# Patient Record
Sex: Female | Born: 1965 | Race: White | Hispanic: No | Marital: Married | Smoking: Never smoker
Health system: Southern US, Community
[De-identification: ages and names within clinical notes are randomized; demographics above are authoritative.]

## PROBLEM LIST (undated history)

## (undated) DIAGNOSIS — R55 Syncope and collapse: Secondary | ICD-10-CM

## (undated) DIAGNOSIS — F329 Major depressive disorder, single episode, unspecified: Secondary | ICD-10-CM

## (undated) DIAGNOSIS — K5900 Constipation, unspecified: Secondary | ICD-10-CM

## (undated) DIAGNOSIS — B029 Zoster without complications: Secondary | ICD-10-CM

## (undated) DIAGNOSIS — R112 Nausea with vomiting, unspecified: Secondary | ICD-10-CM

## (undated) DIAGNOSIS — M549 Dorsalgia, unspecified: Secondary | ICD-10-CM

## (undated) DIAGNOSIS — Z87442 Personal history of urinary calculi: Secondary | ICD-10-CM

## (undated) DIAGNOSIS — Z9889 Other specified postprocedural states: Secondary | ICD-10-CM

## (undated) DIAGNOSIS — Z5189 Encounter for other specified aftercare: Secondary | ICD-10-CM

## (undated) DIAGNOSIS — L4050 Arthropathic psoriasis, unspecified: Secondary | ICD-10-CM

## (undated) DIAGNOSIS — E785 Hyperlipidemia, unspecified: Secondary | ICD-10-CM

## (undated) DIAGNOSIS — E538 Deficiency of other specified B group vitamins: Secondary | ICD-10-CM

## (undated) DIAGNOSIS — F419 Anxiety disorder, unspecified: Secondary | ICD-10-CM

## (undated) DIAGNOSIS — F32A Depression, unspecified: Secondary | ICD-10-CM

## (undated) DIAGNOSIS — R9389 Abnormal findings on diagnostic imaging of other specified body structures: Secondary | ICD-10-CM

## (undated) DIAGNOSIS — R002 Palpitations: Secondary | ICD-10-CM

## (undated) DIAGNOSIS — R0602 Shortness of breath: Secondary | ICD-10-CM

## (undated) DIAGNOSIS — N289 Disorder of kidney and ureter, unspecified: Secondary | ICD-10-CM

## (undated) DIAGNOSIS — R7303 Prediabetes: Secondary | ICD-10-CM

## (undated) DIAGNOSIS — R600 Localized edema: Secondary | ICD-10-CM

## (undated) DIAGNOSIS — Z950 Presence of cardiac pacemaker: Secondary | ICD-10-CM

## (undated) DIAGNOSIS — R51 Headache: Secondary | ICD-10-CM

## (undated) DIAGNOSIS — K219 Gastro-esophageal reflux disease without esophagitis: Secondary | ICD-10-CM

## (undated) HISTORY — DX: Prediabetes: R73.03

## (undated) HISTORY — DX: Hyperlipidemia, unspecified: E78.5

## (undated) HISTORY — DX: Major depressive disorder, single episode, unspecified: F32.9

## (undated) HISTORY — DX: Headache: R51

## (undated) HISTORY — DX: Depression, unspecified: F32.A

## (undated) HISTORY — DX: Shortness of breath: R06.02

## (undated) HISTORY — DX: Localized edema: R60.0

## (undated) HISTORY — DX: Encounter for other specified aftercare: Z51.89

## (undated) HISTORY — PX: TUBAL LIGATION: SHX77

## (undated) HISTORY — DX: Zoster without complications: B02.9

## (undated) HISTORY — PX: OTHER SURGICAL HISTORY: SHX169

## (undated) HISTORY — PX: INSERT / REPLACE / REMOVE PACEMAKER: SUR710

## (undated) HISTORY — DX: Anxiety disorder, unspecified: F41.9

## (undated) HISTORY — DX: Palpitations: R00.2

## (undated) HISTORY — DX: Gastro-esophageal reflux disease without esophagitis: K21.9

## (undated) HISTORY — DX: Deficiency of other specified B group vitamins: E53.8

## (undated) HISTORY — DX: Dorsalgia, unspecified: M54.9

## (undated) HISTORY — DX: Constipation, unspecified: K59.00

---
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
## 1987-09-23 HISTORY — PX: LUMBAR LAMINECTOMY: SHX95

## 1995-04-28 HISTORY — PX: INSERT / REPLACE / REMOVE PACEMAKER: SUR710

## 2000-09-21 ENCOUNTER — Encounter: Admission: RE | Admit: 2000-09-21 | Discharge: 2000-09-21 | Payer: Self-pay | Admitting: Family Medicine

## 2000-09-21 ENCOUNTER — Encounter: Payer: Self-pay | Admitting: Family Medicine

## 2000-10-12 ENCOUNTER — Encounter: Payer: Self-pay | Admitting: Family Medicine

## 2000-10-12 ENCOUNTER — Encounter: Admission: RE | Admit: 2000-10-12 | Discharge: 2000-10-12 | Payer: Self-pay | Admitting: Family Medicine

## 2000-11-21 ENCOUNTER — Encounter: Payer: Self-pay | Admitting: Family Medicine

## 2000-11-21 ENCOUNTER — Encounter: Admission: RE | Admit: 2000-11-21 | Discharge: 2000-11-21 | Payer: Self-pay | Admitting: Family Medicine

## 2001-09-23 ENCOUNTER — Encounter (INDEPENDENT_AMBULATORY_CARE_PROVIDER_SITE_OTHER): Payer: Self-pay | Admitting: Specialist

## 2001-09-23 ENCOUNTER — Ambulatory Visit (HOSPITAL_COMMUNITY): Admission: RE | Admit: 2001-09-23 | Discharge: 2001-09-23 | Payer: Self-pay | Admitting: *Deleted

## 2001-09-30 ENCOUNTER — Encounter: Payer: Self-pay | Admitting: Family Medicine

## 2001-09-30 ENCOUNTER — Encounter: Admission: RE | Admit: 2001-09-30 | Discharge: 2001-09-30 | Payer: Self-pay | Admitting: Family Medicine

## 2002-04-15 ENCOUNTER — Encounter: Payer: Self-pay | Admitting: Neurosurgery

## 2002-04-15 ENCOUNTER — Encounter: Admission: RE | Admit: 2002-04-15 | Discharge: 2002-04-15 | Payer: Self-pay | Admitting: Neurosurgery

## 2002-09-22 HISTORY — PX: LUMBAR FUSION: SHX111

## 2002-09-30 ENCOUNTER — Encounter: Admission: RE | Admit: 2002-09-30 | Discharge: 2002-09-30 | Payer: Self-pay | Admitting: Family Medicine

## 2002-09-30 ENCOUNTER — Encounter: Payer: Self-pay | Admitting: Family Medicine

## 2002-09-30 HISTORY — PX: BREAST BIOPSY: SHX20

## 2002-10-17 ENCOUNTER — Encounter: Payer: Self-pay | Admitting: Emergency Medicine

## 2002-10-17 ENCOUNTER — Emergency Department (HOSPITAL_COMMUNITY): Admission: EM | Admit: 2002-10-17 | Discharge: 2002-10-18 | Payer: Self-pay | Admitting: Emergency Medicine

## 2003-01-17 ENCOUNTER — Encounter: Payer: Self-pay | Admitting: Neurosurgery

## 2003-01-17 ENCOUNTER — Encounter: Admission: RE | Admit: 2003-01-17 | Discharge: 2003-01-17 | Payer: Self-pay | Admitting: Neurosurgery

## 2003-07-06 ENCOUNTER — Encounter: Payer: Self-pay | Admitting: Orthopaedic Surgery

## 2003-07-12 ENCOUNTER — Inpatient Hospital Stay (HOSPITAL_COMMUNITY): Admission: RE | Admit: 2003-07-12 | Discharge: 2003-07-14 | Payer: Self-pay | Admitting: Orthopaedic Surgery

## 2003-07-12 ENCOUNTER — Encounter: Payer: Self-pay | Admitting: Orthopaedic Surgery

## 2003-07-14 ENCOUNTER — Encounter: Payer: Self-pay | Admitting: Orthopaedic Surgery

## 2004-10-11 ENCOUNTER — Ambulatory Visit: Payer: Self-pay | Admitting: Family Medicine

## 2004-10-27 ENCOUNTER — Emergency Department: Payer: Self-pay | Admitting: Emergency Medicine

## 2004-10-31 ENCOUNTER — Ambulatory Visit: Payer: Self-pay | Admitting: Internal Medicine

## 2004-11-05 ENCOUNTER — Ambulatory Visit: Payer: Self-pay | Admitting: Internal Medicine

## 2004-11-08 ENCOUNTER — Ambulatory Visit: Payer: Self-pay | Admitting: Internal Medicine

## 2004-12-03 ENCOUNTER — Ambulatory Visit: Payer: Self-pay | Admitting: Family Medicine

## 2005-01-30 ENCOUNTER — Ambulatory Visit: Payer: Self-pay | Admitting: Family Medicine

## 2005-05-06 ENCOUNTER — Ambulatory Visit: Payer: Self-pay | Admitting: Family Medicine

## 2005-05-13 ENCOUNTER — Ambulatory Visit: Payer: Self-pay | Admitting: Family Medicine

## 2005-07-08 ENCOUNTER — Ambulatory Visit: Payer: Self-pay | Admitting: Internal Medicine

## 2005-07-11 ENCOUNTER — Ambulatory Visit: Payer: Self-pay

## 2005-08-12 ENCOUNTER — Ambulatory Visit: Payer: Self-pay | Admitting: Internal Medicine

## 2005-09-06 ENCOUNTER — Ambulatory Visit: Payer: Self-pay | Admitting: Internal Medicine

## 2005-09-16 ENCOUNTER — Ambulatory Visit: Payer: Self-pay | Admitting: Internal Medicine

## 2005-09-18 ENCOUNTER — Ambulatory Visit: Payer: Self-pay | Admitting: Family Medicine

## 2005-10-21 ENCOUNTER — Ambulatory Visit: Payer: Self-pay | Admitting: Internal Medicine

## 2005-11-15 ENCOUNTER — Ambulatory Visit: Payer: Self-pay | Admitting: Family Medicine

## 2006-02-07 ENCOUNTER — Ambulatory Visit: Payer: Self-pay | Admitting: Internal Medicine

## 2006-02-10 ENCOUNTER — Ambulatory Visit: Payer: Self-pay | Admitting: *Deleted

## 2006-03-13 ENCOUNTER — Ambulatory Visit: Payer: Self-pay | Admitting: Family Medicine

## 2006-05-12 ENCOUNTER — Ambulatory Visit: Payer: Self-pay | Admitting: Family Medicine

## 2006-07-09 ENCOUNTER — Ambulatory Visit: Payer: Self-pay | Admitting: Obstetrics and Gynecology

## 2006-08-10 ENCOUNTER — Ambulatory Visit: Payer: Self-pay | Admitting: Family Medicine

## 2006-09-16 ENCOUNTER — Ambulatory Visit: Payer: Self-pay | Admitting: Family Medicine

## 2006-10-16 ENCOUNTER — Ambulatory Visit: Payer: Self-pay | Admitting: Family Medicine

## 2006-10-16 LAB — CONVERTED CEMR LAB
ALT: 17 units/L (ref 0–40)
AST: 20 units/L (ref 0–37)
Albumin: 3.9 g/dL (ref 3.5–5.2)
Alkaline Phosphatase: 36 units/L — ABNORMAL LOW (ref 39–117)
BUN: 12 mg/dL (ref 6–23)
CO2: 32 meq/L (ref 19–32)
Calcium: 9.7 mg/dL (ref 8.4–10.5)
Chloride: 106 meq/L (ref 96–112)
Creatinine, Ser: 0.9 mg/dL (ref 0.4–1.2)
GFR calc Af Amer: 90 mL/min
GFR calc non Af Amer: 74 mL/min
Glucose, Bld: 101 mg/dL — ABNORMAL HIGH (ref 70–99)
HCT: 38.4 % (ref 36.0–46.0)
Hemoglobin: 13.4 g/dL (ref 12.0–15.0)
Hgb A1c MFr Bld: 5.8 % (ref 4.6–6.0)
MCHC: 35 g/dL (ref 30.0–36.0)
MCV: 91.5 fL (ref 78.0–100.0)
Platelets: 326 10*3/uL (ref 150–400)
Potassium: 3.7 meq/L (ref 3.5–5.1)
RBC: 4.2 M/uL (ref 3.87–5.11)
RDW: 12.7 % (ref 11.5–14.6)
Sodium: 143 meq/L (ref 135–145)
TSH: 1.23 microintl units/mL (ref 0.35–5.50)
Total Bilirubin: 0.8 mg/dL (ref 0.3–1.2)
Total Protein: 7.4 g/dL (ref 6.0–8.3)
WBC: 8.8 10*3/uL (ref 4.5–10.5)

## 2006-12-22 ENCOUNTER — Ambulatory Visit: Payer: Self-pay | Admitting: Internal Medicine

## 2007-01-11 ENCOUNTER — Ambulatory Visit: Payer: Self-pay | Admitting: Family Medicine

## 2007-05-11 ENCOUNTER — Ambulatory Visit: Payer: Self-pay | Admitting: Internal Medicine

## 2007-09-23 HISTORY — PX: TOTAL ABDOMINAL HYSTERECTOMY: SHX209

## 2007-09-23 HISTORY — PX: ABDOMINAL HYSTERECTOMY: SHX81

## 2007-12-15 ENCOUNTER — Telehealth: Payer: Self-pay | Admitting: Family Medicine

## 2007-12-15 ENCOUNTER — Ambulatory Visit: Payer: Self-pay | Admitting: Obstetrics and Gynecology

## 2008-01-18 ENCOUNTER — Ambulatory Visit: Payer: Self-pay | Admitting: Family Medicine

## 2008-01-18 DIAGNOSIS — R51 Headache: Secondary | ICD-10-CM | POA: Insufficient documentation

## 2008-01-18 DIAGNOSIS — R519 Headache, unspecified: Secondary | ICD-10-CM | POA: Insufficient documentation

## 2008-01-18 DIAGNOSIS — F329 Major depressive disorder, single episode, unspecified: Secondary | ICD-10-CM

## 2008-01-18 DIAGNOSIS — J309 Allergic rhinitis, unspecified: Secondary | ICD-10-CM | POA: Insufficient documentation

## 2008-01-18 DIAGNOSIS — J45909 Unspecified asthma, uncomplicated: Secondary | ICD-10-CM | POA: Insufficient documentation

## 2008-01-18 DIAGNOSIS — F3289 Other specified depressive episodes: Secondary | ICD-10-CM | POA: Insufficient documentation

## 2008-01-25 ENCOUNTER — Encounter: Payer: Self-pay | Admitting: Family Medicine

## 2008-03-21 ENCOUNTER — Ambulatory Visit: Payer: Self-pay | Admitting: Internal Medicine

## 2008-04-07 ENCOUNTER — Telehealth: Payer: Self-pay | Admitting: Family Medicine

## 2008-06-20 ENCOUNTER — Ambulatory Visit: Payer: Self-pay | Admitting: Internal Medicine

## 2008-07-18 ENCOUNTER — Ambulatory Visit: Payer: Self-pay | Admitting: Family Medicine

## 2008-07-18 DIAGNOSIS — I10 Essential (primary) hypertension: Secondary | ICD-10-CM | POA: Insufficient documentation

## 2008-07-20 ENCOUNTER — Encounter: Payer: Self-pay | Admitting: Family Medicine

## 2008-07-20 ENCOUNTER — Ambulatory Visit: Payer: Self-pay | Admitting: Obstetrics and Gynecology

## 2008-07-20 ENCOUNTER — Ambulatory Visit: Payer: Self-pay | Admitting: Family Medicine

## 2008-07-26 ENCOUNTER — Telehealth: Payer: Self-pay | Admitting: Family Medicine

## 2008-08-08 ENCOUNTER — Ambulatory Visit: Payer: Self-pay | Admitting: Obstetrics and Gynecology

## 2008-08-16 ENCOUNTER — Telehealth: Payer: Self-pay | Admitting: Family Medicine

## 2008-08-31 ENCOUNTER — Inpatient Hospital Stay: Payer: Self-pay | Admitting: Obstetrics and Gynecology

## 2008-09-27 ENCOUNTER — Ambulatory Visit: Payer: Self-pay | Admitting: Obstetrics and Gynecology

## 2008-11-28 ENCOUNTER — Ambulatory Visit: Payer: Self-pay | Admitting: Internal Medicine

## 2009-04-16 ENCOUNTER — Ambulatory Visit: Payer: Self-pay | Admitting: Unknown Physician Specialty

## 2009-09-25 ENCOUNTER — Ambulatory Visit: Payer: Self-pay | Admitting: Unknown Physician Specialty

## 2009-11-26 ENCOUNTER — Telehealth: Payer: Self-pay | Admitting: Internal Medicine

## 2009-11-27 ENCOUNTER — Ambulatory Visit: Payer: Self-pay | Admitting: Cardiovascular Disease

## 2009-11-27 DIAGNOSIS — R55 Syncope and collapse: Secondary | ICD-10-CM | POA: Insufficient documentation

## 2009-11-27 DIAGNOSIS — R079 Chest pain, unspecified: Secondary | ICD-10-CM | POA: Insufficient documentation

## 2009-11-29 ENCOUNTER — Telehealth: Payer: Self-pay | Admitting: Cardiovascular Disease

## 2009-12-03 ENCOUNTER — Ambulatory Visit: Payer: Self-pay | Admitting: Internal Medicine

## 2009-12-03 DIAGNOSIS — Z950 Presence of cardiac pacemaker: Secondary | ICD-10-CM | POA: Insufficient documentation

## 2009-12-07 ENCOUNTER — Telehealth: Payer: Self-pay | Admitting: Internal Medicine

## 2009-12-17 ENCOUNTER — Ambulatory Visit: Payer: Self-pay | Admitting: Internal Medicine

## 2009-12-17 ENCOUNTER — Telehealth: Payer: Self-pay | Admitting: Internal Medicine

## 2009-12-18 ENCOUNTER — Encounter: Payer: Self-pay | Admitting: Internal Medicine

## 2009-12-18 LAB — CONVERTED CEMR LAB
BUN: 19 mg/dL
Basophils Relative: 0.5 %
CO2: 26 meq/L
Calcium: 8.9 mg/dL
Chloride: 102 meq/L
Creatinine, Ser: 0.82 mg/dL
Eosinophils Relative: 2.9 %
Glucose, Bld: 93 mg/dL
HCT: 41.3 %
Hemoglobin: 14.2 g/dL
INR: 0.9
Lymphocytes, automated: 29.7 %
MCV: 93 fL
Monocytes Relative: 5.8 %
Neutrophils Relative %: 61.1 %
Platelets: 315 10*3/uL
Potassium: 4.4 meq/L
Prothrombin Time: 12.3 s
RBC: 4.42 M/uL
RDW: 13.4 %
Sodium: 135 meq/L
WBC: 6.2 10*3/uL

## 2009-12-19 ENCOUNTER — Ambulatory Visit: Payer: Self-pay | Admitting: Internal Medicine

## 2009-12-19 ENCOUNTER — Encounter: Payer: Self-pay | Admitting: Cardiovascular Disease

## 2009-12-27 ENCOUNTER — Encounter: Payer: Self-pay | Admitting: Internal Medicine

## 2009-12-31 ENCOUNTER — Ambulatory Visit: Payer: Self-pay | Admitting: Internal Medicine

## 2010-01-02 ENCOUNTER — Ambulatory Visit: Payer: Self-pay | Admitting: Unknown Physician Specialty

## 2010-02-02 ENCOUNTER — Ambulatory Visit: Payer: Self-pay | Admitting: Internal Medicine

## 2010-03-08 ENCOUNTER — Ambulatory Visit: Payer: Self-pay | Admitting: General Practice

## 2010-03-29 ENCOUNTER — Ambulatory Visit: Payer: Self-pay | Admitting: Unknown Physician Specialty

## 2010-08-07 ENCOUNTER — Ambulatory Visit: Payer: Self-pay

## 2010-09-06 ENCOUNTER — Ambulatory Visit: Payer: Self-pay

## 2010-10-17 ENCOUNTER — Encounter (INDEPENDENT_AMBULATORY_CARE_PROVIDER_SITE_OTHER): Payer: Self-pay | Admitting: *Deleted

## 2010-10-19 ENCOUNTER — Encounter: Payer: Self-pay | Admitting: Internal Medicine

## 2010-10-24 NOTE — Progress Notes (Signed)
Summary: chest pain  Phone Note Call from Patient Call back at Home Phone [phone]   Caller: Patient Summary of Call: Patient called, c/o atypical chest pain for past week.  Pt last seen in 2008 by Dr [name] for pacemaker follow-up.  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  She was also complaining of chest pain at that time, a nuc study was scheduled that she no-showed for.  She states this pain is different from then.  She states that it feels like someone has a blood pressure cuff around her left upper arm.  Not necessarily related to exertion.  No SOB.  Pt states that her cholesterol was checked through an employee health screening and was high, but her PCP has not started her on any lipid lowering agents.  Appt made with Dr Mariah Milling tomorrow at 11AM to be evaluated.  Will have Biotronik rep meet pt there to evaluate her device. Gypsy Balsam RN BSN  November 26, 2009 3:32 PM

## 2010-10-24 NOTE — Assessment & Plan Note (Signed)
Summary: chest pain.amber   Visit Type:  rov Referring Provider:  [name] Primary Provider:  Nelwyn Salisbury MD  CC:  chest pains, dizzy spells associated with pressure in chest, and feels like her arm gets tight. dizziness makes her feel luppy. sat night her tounge and mouth was numb feeling. sunday got really bad headache behind eye. Sometimes feels like can't get a deep breath. Edema in ankles and feet nothing new. .  History of Present Illness: 45 year old woman who works at [employer] at the [facility], previously seen by Dr. 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  She states that over the past several weeks, she has had several episodes of chest pressure associated with left arm discomfort. Sometimes she has dizziness. She was not going to do anything about it though recently had headaches, tongue and mouth numbness over the weekend when she woke up. She does have a history of migraines though feels that this is different. Her blood pressure has been elevated,  sometimes with systolics greater than 90 at home.work has been very stressful as they recently moved offices. She wonders if her chest pain may be due to underlying stress at work.  she denies any further episodes of syncope though has had some dizziness typically associated with her chest pressure.  Preventive Screening-Counseling & Management  Alcohol-Tobacco     Alcohol drinks/day: 0     Smoking Status: never  Caffeine-Diet-Exercise     Caffeine use/day: 1 can a day     Does Patient Exercise: no  Current Problems (verified): 1)  Hypertension  (ICD-401.9) 2)  Acute Sinusitis, Unspecified  (ICD-461.9) 3)  Headache  (ICD-784.0) 4)  Family History of Cad  Female 1st Degree Relative <50  (ICD-V17.3) 5)  Family History of Asthma  (ICD-V17.5) 6)  Depression  (ICD-311) 7)  Asthma  (ICD-493.90) 8)  Allergic Rhinitis  (ICD-477.9)  Current Medications (verified): 1)  Proventil Hfa 108 (90 Base) Mcg/act  Aers (Albuterol Sulfate) .... 2 Puffs Every 4 Hours As Needed For Sob 2)  Daily Multi  Tabs (Multiple Vitamins-Minerals) .Marland Kitchen.. 1 By Mouth Once Daily  Allergies (verified): No Known Drug Allergies  Past History:  Past Medical History: Last updated: 01/18/2008 Allergic rhinitis Asthma Depression Headache  Past Surgical History: Last updated: 07/18/2008 Rt lobe hemithroidectomy Permanent pacemaker, sees Dr. [name]  Family History: Last updated: 01/18/2008 Epilepsy-father Family History of Asthma Family History of CAD Female 1st degree relative <50 Family History Lung cancer Family History of Prostate CA 1st degree relative <50  Social History: Last updated: 01/18/2008 Married Never Smoked Alcohol use-no Drug use-no  Risk Factors: Alcohol Use: 0 (11/27/2009) Caffeine Use: 1 can a day (11/27/2009) Exercise: no (11/27/2009)  Risk Factors: Smoking Status: never (11/27/2009)  Social History: Alcohol drinks/day:  0 Caffeine use/day:  1 can a day Does Patient Exercise:  no  Review of Systems       The patient complains of chest pain.  The patient denies fever, vision loss, decreased hearing, hoarseness, syncope, dyspnea on exertion, peripheral edema, prolonged cough, abdominal pain, incontinence, muscle weakness, difficulty walking, depression, unusual weight change, and enlarged lymph nodes.         Arm pain, dizziness  Vital Signs:  Patient profile:  45 year old female Height:      66 inches Weight:      239 pounds BMI:     38.72 Pulse rate:   80 / minute Pulse rhythm:   regular BP sitting:   140 / 88  (left arm) Cuff size:   large  Vitals Entered By: Mercer Pod (November 27, 2009 11:39 AM)  Physical  Exam  General:  young woman in no apparent distress, HEENT exam is benign, oropharynx is clear, neck is supple with no JVP or carotid bruits heart sounds are regular with S1 and S2 , no murmur appreciatedr, lungs are clear to auscultation with no wheezes Rales, abdominal exam is benign, she is no significant lower extremity edema, neurologic exam was not fully tested though grossly normal, pulses are equal and symmetrical in her upper and lower extremities, skin is warm and dry.   New Orders:     1)  Nuclear Stress Test (Nuc Stress Test)   EKG  Procedure date:  11/27/2009  Findings:      normal sinus rhythm with rate of 80 beats per minute, no significant ST or T wave changes. With magnet, paced rhythm at 90 beats per minute  Impression & Recommendations:  Problem # 1:  CHEST PAIN UNSPECIFIED (ICD-786.50) Etiology of her chest pain is uncertain though does sound somewhat atypical. She does not have a strong family history of coronary disease, she does have hyperlipidemia according to he,r based on the number she had done at [facility] last year with cholesterol greater than 200. We have set up a stress test for her at [facility] and she will walk on a treadmill. no significant EKG changes on today's visit.   Orders: Nuclear Stress Test (Nuc Stress Test)  Problem # 2:  HYPERTENSION (ICD-401.9)  She is not currently on any medications for blood pressure. We have asked her to monitor her blood pressure and contact us if her numbers are elevated.  Problem # 3:  VASOVAGAL SYNCOPE (ICD-780.2) History of vasovagal syncope, previously followed by [facility]. She had a pacemaker placed with replacement generator in 2004. Per the patient, settings were  changed at that time due to pacemaker mediated tachycardia.  Full evaluation of her pacemaker was done today and has been scanned in as a separate document. Her last pacemaker interrogation was over one year ago.  Intermittent atrial under sensing was noted on tests. The Atrial output was changed to 2.2 V at 0.5 ms, atrial sensitivity at 1.0 mV due to unipolar sensing configuration. Approximately ERI was 7 months remaining. Backup rate was set at 35 beats per minute. This was changed to 60 beats per minute due to recent episodes of dizziness.  We will have her followup with Dr. [name] in three months time.  Patient Instructions: 1)  Your physician recommends that you schedule a follow-up appointment in: [name] in 3 months 2)  Your physician recommends that you continue on your current medications as directed. Please refer to the Current Medication list given to you today. 3)  Your physician has requested that you have an exercise stress myoview.  For further information please visit [email or web address]   4)  Nothing to eat or drink after midnight.  Please wear comfortable clothes, short sleeved shirt, appropriate shoes.  Take all medication after test.   5)  12/03/09 @ 8:00.  Please arrive to [facility] of [facility] at 7:30 for registration.  Please plan on 3-4 hours.

## 2010-10-24 NOTE — Op Note (Signed)
Summary: Explantation of Device  Explantation of Device   Imported By: Harlon Flor 12/20/2009 09:09:18  _____________________________________________________________________  External Attachment:    Type:   Image     Comment:   External Document

## 2010-10-24 NOTE — Letter (Signed)
Summary: Device-Delinquent Check  [facility], Main Office  [address]   [place], [place] [address]   Phone: [phone]  Fax: [phone]     October 17, 2010 MRN: [id number]   Emma Rogers [address] [place], [place]  [address]   Dear Emma Rogers,  According to our records, you have not had your implanted device checked in the recommended period of time.  We are unable to determine appropriate device function without checking your device on a regular basis.  Please call our office to schedule an appointment, with Dr [name],  as soon as possible.  If you are having your device checked by another physician, please call us so that we may update our records.  Thank you,  Letta Moynahan, EMT  October 17, 2010 1:41 PM  [facility] certified

## 2010-10-24 NOTE — Miscellaneous (Signed)
Summary: Device change out  Clinical Lists Changes  Observations: Added new observation of MAGNET RTE: BOL 85 ERI  65 (12/27/2009 16:11) Added new observation of PPMLEADSTAT2: active (12/27/2009 16:11) Added new observation of PPMLEADSER2: [id number] (12/27/2009 16:11) Added new observation of PPMLEADMOD2: 5034  (12/27/2009 16:11) Added new observation of PPMLEADDOI2: 04/28/1995  (12/27/2009 16:11) Added new observation of PPMLEADLOC2: RV  (12/27/2009 16:11) Added new observation of PPMLEADSTAT1: active  (12/27/2009 16:11) Added new observation of PPMLEADSER1: [id number]  (12/27/2009 16:11) Added new observation of PPMLEADMOD1: 4524  (12/27/2009 16:11) Added new observation of PPMLEADDOI1: 04/28/995  (12/27/2009 16:11) Added new observation of PPMLEADLOC1: RA  (12/27/2009 16:11) Added new observation of PPM IMP MD: Sherryl Manges, MD  (12/27/2009 16:11) Added new observation of PPM DOI: 12/17/2009  (12/27/2009 16:11) Added new observation of PPM SERL#: [id number]  (12/27/2009 16:11) Added new observation of PPM MODL#: [id number]  (12/27/2009 10:27) Added new observation of PACEMAKERMFG: Medtronic  (12/27/2009 16:11) Added new observation of PACEMAKER MD: Sherryl Manges, MD  (12/27/2009 16:11)      PPM Specifications Following MD:  Sherryl Manges, MD     PPM Vendor:  Medtronic     PPM Model Number:  ADDRL1     PPM Serial Number:  [id number] PPM DOI:  12/17/2009     PPM Implanting MD:  Sherryl Manges, MD  Lead 1    Location: RA     DOI: 04/28/995     Model #: 4524     Serial #: [id number]     Status: active Lead 2    Location: RV     DOI: 04/28/1995     Model #: [id number]     Serial #: [id number]     Status: active  Magnet Response Rate:  BOL 85 ERI  65

## 2010-10-24 NOTE — Miscellaneous (Signed)
  Clinical Lists Changes  Observations: Added new observation of INR: 0.9  (12/18/2009 11:54) Added new observation of PT PATIENT: 12.3 s (12/18/2009 11:54) Added new observation of BASOPHIL %: 0.5 % (12/18/2009 11:54) Added new observation of % EOS AUTO: 2.9 % (12/18/2009 11:54) Added new observation of MONOCYTE %: 5.8 % (12/18/2009 11:54) Added new observation of LYMPH %: 29.7 % (12/18/2009 11:54) Added new observation of PMN %: 61.1 % (12/18/2009 11:54) Added new observation of RDW: 13.4 % (12/18/2009 11:54) Added new observation of MCV: 93 fL (12/18/2009 11:54) Added new observation of PLATELETK/UL: 315 K/uL (12/18/2009 11:54) Added new observation of RBC M/UL: 4.42 M/uL (12/18/2009 11:54) Added new observation of HCT: 41.3 % (12/18/2009 11:54) Added new observation of HGB: 14.2 g/dL (16/06/9603 54:09) Added new observation of WBC COUNT: 6.2 10*3/microliter (12/18/2009 11:54) Added new observation of CALCIUM: 8.9 mg/dL (81/19/1478 29:56) Added new observation of CO2 PLSM/SER: 26 meq/L (12/18/2009 11:54) Added new observation of CL SERUM: 102 meq/L (12/18/2009 11:54) Added new observation of K SERUM: 4.4 meq/L (12/18/2009 11:54) Added new observation of NA: 135 meq/L (12/18/2009 11:54) Added new observation of CREATININE: 0.82 mg/dL (21/30/8657 84:69) Added new observation of BUN: 19 mg/dL (62/95/2841 32:44) Added new observation of BG RANDOM: 93 mg/dL (09/24/7251 66:44)      -  Date:  12/18/2009    BG Random: 93    BUN: 19    Creatinine: 0.82    Sodium: 135    Potassium: 4.4    Chloride: 102    CO2 Total: 26    Calcium: 8.9    WBC: 6.2    HGB: 14.2    HCT: 41.3    RBC: 4.42    PLT: 315    MCV: 93    RDW: 13.4    Neutrophil: 61.1    Lymphs: 29.7    Monos: 5.8    Eos: 2.9    Basophil: 0.5    Protime (PT): 12.3    INR: 0.9

## 2010-10-24 NOTE — Progress Notes (Signed)
Summary: PACEMAKER  Phone Note Call from Patient Call back at [phone]   Caller: self Call For: Emma Rogers Summary of Call: WOULD LIKE A CALL BACK ABOUT SCHEDULING TO GET HER PACEMAKER CHANGED OUT Initial call taken by: Harlon Flor,  December 07, 2009 4:22 PM  Follow-up for Phone Call        Patient called back again this morning to get pacemaker change out set-up at [facility]. Everything has been taken care of in regards to the device they were trying to get, patient would like to get this scheduled as soon as possible since her hours at work will be changing near the beginning/mid of April.  Please call patient back today to get scheduled, her cell phone number is [phone] Follow-up by: [name],  December 10, 2009 11:15 AM  Additional Follow-up for Phone Call Additional follow up Details #1::        spoke with pt and she would like to have pacer change out done before 4/18 as her hours as work will be changing.  Please advise how you would like to handle this as far as scheduling.   Additional Follow-up by: [name], RN, BSN,  December 10, 2009 1:33 PM    Additional Follow-up for Phone Call Additional follow up Details #2::    procedure scheduled for 4/15 @ 1pm with [name].  pt called to state that hours at work were changing sooner than she had thought and would like procedure done asap.  will contact klein to see if he would be able to do it sooner than 4/15. [name] RN BSN   Rescheduled for 3/30 @ 12:00 with [name].

## 2010-10-24 NOTE — Assessment & Plan Note (Signed)
Summary: ROV/AMD   Visit Type:  Initial Consult Referring Provider:  [name] Primary Provider:  Nelwyn Salisbury MD  CC:  has a cold- some SOB (attributes it to cold and asthma) and swelling in feet- same; has had 2 episopdes of pacemaker tachycardia.  History of Present Illness: 45 year old woman who works at [employer] [facility] the [facility], previously seen by Dr. 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  The patient was seen by Dr. Lewie Loron last week and was found to have some atrial sensing issues and today has evidence of PMT.  No syncope.  She is approaching device ERI.  Problems Prior to Update: 1)  Vasovagal Syncope  (ICD-780.2) 2)  Chest Pain Unspecified  (ICD-786.50) 3)  Hypertension  (ICD-401.9) 4)  Acute Sinusitis, Unspecified  (ICD-461.9) 5)  Headache  (ICD-784.0) 6)  Family History of Cad Female 1st Degree Relative <50  (ICD-V17.3) 7)  Family History of Asthma  (ICD-V17.5) 8)  Depression  (ICD-311) 9)  Asthma  (ICD-493.90) 10)  Allergic Rhinitis  (ICD-477.9)  Current Problems (verified): 1)  Vasovagal Syncope  (ICD-780.2) 2)  Chest Pain Unspecified  (ICD-786.50) 3)  Hypertension  (ICD-401.9) 4)  Acute Sinusitis, Unspecified  (ICD-461.9) 5)  Headache  (ICD-784.0) 6)  Family History of Cad Female 1st Degree Relative <50  (ICD-V17.3) 7)  Family History of Asthma  (ICD-V17.5) 8)  Depression  (ICD-311) 9)  Asthma  (ICD-493.90) 10)  Allergic Rhinitis  (ICD-477.9)  Current Medications (verified): 1)  Proventil Hfa 108 (90 Base) Mcg/act  Aers (Albuterol Sulfate) .... 2 Puffs Every 4 Hours As Needed For Sob 2)  Daily Multi  Tabs (Multiple Vitamins-Minerals) .Marland Kitchen.. 1 By Mouth Once Daily  Allergies (verified): No Known  Drug Allergies  Past History:  Past Medical History: Last updated: 01/18/2008 Allergic rhinitis Asthma Depression Headache  Past Surgical History: Last updated: 07/18/2008 Rt lobe hemithroidectomy Permanent pacemaker, sees Dr. [name]  Review of Systems  The patient denies chest pain, syncope, dyspnea on exertion, and peripheral edema.    Vital Signs:  Patient profile:   45 year old female Height:      66 inches Weight:      238.50 pounds Pulse rate:   68 / minute Pulse rhythm:   regular BP sitting:   120 / 72  (left arm) Cuff size:   large  Vitals Entered By: [name], RN, BSN (December 03, 2009 11:57 AM)  Physical Exam  General:  young woman in no apparent distress, HEENT exam is benign, oropharynx is clear, neck is supple with no JVP or carotid bruits heart sounds are regular with S1 and S2 , no murmur appreciatedr, lungs are clear to auscultation with no wheezes Rales, abdominal exam is benign, she is no significant lower extremity edema, neurologic exam was not fully tested though grossly normal, pulses are equal and symmetrical in her upper and lower extremities, skin is warm and dry.   MD Comments:  I have reviewed the interogation. She has atrial undersensing and elevated pacing thresholds bipolar.  Also we have reprogrammed her PVARP to minimize PMT.  Impression & Recommendations:  Problem # 1:  VASOVAGAL SYNCOPE (ICD-780.2) Her symptoms have been fairly well controlled with her PPM.  She will continue her current  treatment.  Problem # 2:  CARDIAC PACEMAKER IN SITU (ICD-V45.01) Her device has some undersensing with p waves of 0.5 and elevated pacing thresholds and she will likely need a new atrial lead.  She is approaching ERI and we will schedule her to undergo PPM gen change with a Biotronik device in the next month or two.  Problem # 3:  HYPERTENSION (ICD-401.9) Her  blood pressure is well controlled today.  Continue observation.

## 2010-10-24 NOTE — Progress Notes (Signed)
Summary: pacer gen change  Phone Note Outgoing Call Call back at [facility] Phone [phone]   Call placed by: Gypsy Balsam RN BSN,  December 17, 2009 3:33 PM Summary of Call: Called patient and left message on machine about procedure on Wednesday for pacer gen change with Dr [name].  Dr [name] will put in medtronic device instead of Biotronik, wanted to make sure that patient was aware of plan. Gypsy Balsam RN BSN  December 17, 2009 3:34 PM   Follow-up for Phone Call        PT RTN CALL TO AMBER YOU CAN CALL HER BACK SAME NUMBER Omer Jack  December 17, 2009 3:39 PM  Additional Follow-up for Phone Call Additional follow up Details #1::        Spoke with patient.  She is aware we are changing out for a Medtronic device instead of Biotronik.  Gypsy Balsam RN BSN  December 17, 2009 5:03 PM

## 2010-10-24 NOTE — Cardiovascular Report (Signed)
Summary: Office Visit   Office Visit   Imported By: Roderic Ovens 01/09/2010 16:28:31  _____________________________________________________________________  External Attachment:    Type:   Image     Comment:   External Document

## 2010-10-24 NOTE — Progress Notes (Signed)
Summary: PROBLEMS  Phone Note Call from Patient Call back at [phone]   Caller: SELF Call For: [facility] Summary of Call: PT WAS AWAKENED FROM SLEEP LAST NIGHT DUE TO PACEMAKER TACHYCARDIA [phone] Initial call taken by: Harlon Flor,  November 29, 2009 9:46 AM  Follow-up for Phone Call        pt was just calling to let us know that this was occurring.  was not sure if it was related to adjustments made by rep.  pt has appt with dr. Amparo Bristol. Follow-up by: [name], RN, BSN,  November 29, 2009 4:44 PM

## 2010-10-24 NOTE — Procedures (Signed)
Summary: WOUND CHECK; F/U 1 WEEK   Current Medications (verified): 1)  Proventil Hfa 108 (90 Base) Mcg/act  Aers (Albuterol Sulfate) .... 2 Puffs Every 4 Hours As Needed For Sob 2)  Daily Multi  Tabs (Multiple Vitamins-Minerals) .Marland Kitchen.. 1 By Mouth Once Daily  Allergies (verified): No Known Drug Allergies   PPM Specifications Following MD:  Sherryl Manges, MD     PPM Vendor:  Medtronic     PPM Model Number:  [id number]     PPM Serial Number:  [id number] PPM DOI:  12/17/2009     PPM Implanting MD:  Sherryl Manges, MD  Lead 1    Location: RA     DOI: 04/28/995     Model #: 4524     Serial #: [id number]     Status: active Lead 2    Location: RV     DOI: 04/28/1995     Model #: 7829     Serial #: [id number]     Status: active  Magnet Response Rate:  BOL 85 ERI  65  Indications:  Syncope   PPM Follow Up Remote Check?  No Battery Voltage:  2.79 V     Battery Est. Longevity:  10 years     Pacer Dependent:  No       PPM Device Measurements Atrium  Amplitude: 2.0 mV, Impedance: 264 ohms, Threshold: 1.0 V at 0.4 msec Right Ventricle  Amplitude: 4.0 mV, Impedance: 1070 ohms, Threshold: 0.75 V at 0.46 msec  Episodes MS Episodes:  130     Percent Mode Switch:  0.3%     Coumadin:  No Ventricular High Rate:  0     Atrial Pacing:  0.7%     Ventricular Pacing:  0.2%  Parameters Mode:  DDD+     Lower Rate Limit:  50     Upper Rate Limit:  130 Paced AV Delay:  200     Sensed AV Delay:  200 Next Cardiology Appt Due:  03/22/2010 Tech Comments:  No parameter changes.  Device function normal.  Steri strips removed.  No redness or edema noted.  Ms. [name] is c/o extreme fatigue since change out.  I will discuss this with Dr. [name].  She will follow up in 3 months in [place]. Altha Harm, LPN  December 31, 2009 8:30 AM

## 2010-11-06 ENCOUNTER — Ambulatory Visit: Payer: Self-pay | Admitting: Family Medicine

## 2010-11-13 NOTE — Cardiovascular Report (Signed)
Summary: Certified Letter Signed - Patient (not doing f/u)  Certified Letter Signed - Patient (not doing f/u)   Imported By: Debby Freiberg 11/04/2010 11:53:09  _____________________________________________________________________  External Attachment:    Type:   Image     Comment:   External Document

## 2010-11-27 ENCOUNTER — Ambulatory Visit: Payer: Self-pay | Admitting: Unknown Physician Specialty

## 2010-12-22 ENCOUNTER — Ambulatory Visit: Payer: Self-pay | Admitting: Unknown Physician Specialty

## 2011-02-04 NOTE — Assessment & Plan Note (Signed)
[facility]                         ELECTROPHYSIOLOGY OFFICE NOTE   NAME:HOLTLaural Roes                          MRN:          [id number]  DATE:05/11/2007                            DOB:          1966/08/30    [name] is status post pacemaker implantation for vasovagal syncope.  Implant then changed out by Dr. Woodroe Chen.   She now describes chest pain over the last 3-4 weeks. This is mid  sternal, exertional, feeling like a pressure radiating to her left neck  and accompanied by shortness of breath.   Her cardiac risk factors are notable for negative family history,  negative for hypertension. Her cholesterol status is not known.   PHYSICAL EXAMINATION:  VITAL SIGNS:  Her blood pressure is 130/86, her  pulse is 77.  LUNGS:  Clear.  HEART:  Heart sounds were regular.  EXTREMITIES:  Without edema.  ABDOMEN:  Soft with active bowel sounds without midline pulsation,  hepatomegaly or tenderness.   Interrogation of his [employer] pacemaker demonstrates implant date of  2003, P wave of 2.3, impedance of 336, threshold of 1.1 at 0.5. The R  wave was 4.3 with impedance of 780, threshold of 0.7 at 0.5. The device  was modestly reprogrammed for improved longevity and the patient's pace  is essentially 0% in the upper chamber and lower chamber has 0% ectopy.   IMPRESSION:  1. History of neurocardiogenic syncope status post pacemaker      implantation almost never used.  2. Chest pain with very typical finding features with cardiac risk      factors notable for A) hypercholesterolemia question fractionation,      B) question prediabetes, C) hypertension.   Will plan to undertake Myoview scanning for [name]. Because of risk  factor will plan to check her cholesterol status at that time with  fractionation and I will forward these results to her GYN.     Duke Salvia, MD, [facility]  Electronically Signed    SCK/MedQ  DD: 05/11/2007  DT: 05/12/2007   Job #: [id number]

## 2011-02-07 NOTE — H&P (Signed)
NAME:  Emma Rogers                             ACCOUNT NO.:  [id number]   MEDICAL RECORD NO.:  [id number]                   PATIENT TYPE:  INP   LOCATION:  NA                                   FACILITY:  [facility]   PHYSICIAN:  Sharolyn Douglas, M.D.                     DATE OF BIRTH:  November 27, 1965   DATE OF ADMISSION:  DATE OF DISCHARGE:                                HISTORY & PHYSICAL   CHIEF COMPLAINT:  Severe back and left lower extremity pain.   HISTORY OF PRESENT ILLNESS:  Patient is a 45 year old female, status post  lumbar laminectomy with continued low back pain and left lower extremity  pain.  She has tried numerous types of conservative management over the  years, and, unfortunately, has not had any relief from injections, pain  medications, anti-inflammatories, activity modification, physical therapy.  It has gotten to the point that she is having severe pain in her back and  left lower extremity.  She has been seen by two other physicians.  They  recommend posterior spinal fusion procedure.  Secondary to her severe  findings and failure to improve, and now this is affecting her activities of  daily living, and her quality of life has suffered significantly.  Risks and  benefits of procedure were discussed with the patient by Dr. Noel Gerold as well  as myself.  She indicated understanding and opted to proceed.   ALLERGIES:  No known drug allergies.  She has an intolerance to morphine,  causes nausea.   MEDICATIONS:  1. Serevent inhaler 2 puffs b.i.d.  2. Albuterol inhaler p.r.n.  3. Naprosyn 500 mg b.i.d.  4. Flexeril p.r.n.   PAST MEDICAL HISTORY:  1. Significant for asthma.  2. Pacemaker, secondary to neurocardiogenic syncope.   PAST SURGICAL HISTORY:  1. Lumbar laminectomy in 1997 and 2002.  2. Pacemaker implant in 1996, and battery changes in 2003.   SOCIAL HISTORY:  Patient denies tobacco use.  Denies alcohol use.  She is  married, has two children, analgesics 13 and 11.   Her husband will be able  to help her through her postoperative course.   FAMILY HISTORY:  Significant for coronary artery disease and cancer.   REVIEW OF SYSTEMS:  Patient denies fevers, chills, sweats, or bleeding  tendencies.  CENTRAL NERVOUS SYSTEM:  Denies blurry vision, double vision,  seizure, headache, paralysis.  CARDIOVASCULAR:  Denies chest pain, angina,  orthopnea, claudication, or palpitations.  PULMONARY:  Denies shortness of  breath, productive cough, or hemoptysis.  GASTROINTESTINAL:  Denies nausea,  vomiting, constipation, diarrhea, melena or bloody stools.  GENITOURINARY:  Denies dysuria, hematuria, or discharge.  MUSCULOSKELETAL:  As per HPI.   PHYSICAL EXAMINATION:  VITAL SIGNS:  Respirations 16 unlabored; pulse is 72  and regular.  GENERAL APPEARANCE:  Patient is a 45 year old white female who is alert and  oriented, in  no acute distress.  She is well nourished, well groomed,  appears stated age, pleasant, and cooperative to exam.  HEENT:  Head is normocephalic/atraumatic.  Pupils equal, round, reactive.  Extraocular movements intact.  Nares are patent.  Pharynx is clear.  NECK:  Soft to palpation.  No lymphadenopathy or thyromegaly, or bruits  appreciated.  CHEST:  Clear to auscultation bilaterally.  No rales, rhonchi, stridor,  wheezes, or friction rubs.  BREASTS:  Not pertinent.  Not performed.  HEART:  S1, S2.  Regular rate and rhythm.  No murmurs, gallops or rubs  noted.  ABDOMEN:  Soft to palpation.  Positive bowel sounds.  Nontender,  nondistended.  No organomegaly noted.  GENITOURINARY:  Not pertinent.  Not performed.  EXTREMITIES:  Patient has left lower extremity pain.  Motor and sensation  are grossly intact.  Pulses are intact and symmetric.  SKIN:  Is intact without lesions or rashes.   LABORATORY DATA:  MRI shows degenerative disk disease at L4-5.   IMPRESSION:  1. Degenerative disk disease and post-laminectomy syndrome, with continued      back and left lower extremity pain at L4-5.  2. Asthma.  3. Pacemaker.   PLAN:  Admit to [facility] on July 12, 2003 for an L4-5  minimally invasive posterior spinal fusion by Dr. Noel Gerold.   PRIMARY CARE PHYSICIAN:  Jeannett Senior A. Clent Ridges, M.D. [facility]                       Sharolyn Douglas, M.D.    CM/MEDQ  D:  07/12/2003  T:  07/12/2003  Job:  [id number]

## 2011-02-07 NOTE — Op Note (Signed)
NAMEJohna Rogers                             ACCOUNT NO.:  [id number]   MEDICAL RECORD NO.:  [id number]                   PATIENT TYPE:  INP   LOCATION:  2899                                 FACILITY:  [facility]   PHYSICIAN:  Sharolyn Douglas, M.D.                     DATE OF BIRTH:  [birth year]   DATE OF PROCEDURE:  07/12/2003  DATE OF DISCHARGE:                                 OPERATIVE REPORT   PREOPERATIVE DIAGNOSIS:  1. L4-5 post laminectomy syndrome.  2. L4-5 facet arthropathy and degenerative disk disease.   POSTOPERATIVE DIAGNOSIS:  1. L4-5 post laminectomy syndrome.  2. L4-5 facet arthropathy and degenerative disk disease.   OPERATION PERFORMED:  1. Revision L4-5 laminectomy with decompression of the lateral recess, L4     and 5 nerve roots bilaterally.  2. L4-5 transforaminal lumbar interbody fusion with placement of a 14 mm     Nuvasive PEEK prosthetic cage packed with BMP and local autogenous bone     graft.  3. Pedicle screw instrumentation L4-5 utilizing a [employer] and     Compass system.  4. Posterior spinal arthrodesis utilizing local autogenous bone graft.  5. Neural monitoring with testing four pedicle screws using triggered EMGs     and monitoring free-running EMGs for three hours.   SURGEON:  Sharolyn Douglas, M.D.   ASSISTANT:  Verlin Fester, P.A.   ANESTHESIA:  General endotracheal.   COMPLICATIONS:  None.   INDICATIONS FOR PROCEDURE:  The patient is a 45 year old female with a long  history of back complaints. She is status post L4-5 laminectomy times two.  The first operation was done at the [facility] in December 1998.  Her  second surgery was done at [facility].  Over the years she has developed  progressively worsening back and left leg pain.  This has been unresponsive  to conservative care.  Plain radiographs showed facet arthropathy at L4-5  and the previous laminectomy defect.  No motion on flexion and extension  views.  She was unable to obtain an  MRI scan because of a pacemaker.  Therefore, she had a CT myelogram which demonstrated some degree of lateral  recess stenosis at L4-5 secondary to severe facet arthropathy.  She has  obtained several opinions and surgery was recommended.  She elected to  undergo L4-5 decompression and fusion utilizing pedicle screws and TLIF  procedure.  I discussed the use of BMP with her.  The risks and benefits are  understood.   DESCRIPTION OF PROCEDURE:  The patient was properly identified in the  holding area and taken to the operating room.  She underwent general  endotracheal anesthesia without difficulty.  She was given prophylactic IV  antibiotics.  EMG leads were placed for monitoring her free running and  triggered  EMGs.  She was carefully turned prone onto the Wilson frame.  All  boy prominences were padded.  Face and eyes were protected at all times.  Back prepped and draped in the usual sterile fashion.  She had a previous 4  cm midline incision.  This was utilized.  We dissected through the previous  scar tissue.  We identified the residual L4 spinous process as well as the  residual L5 process.  We then carefully dissected out to the L4-5 facet  joint.  We took an intraoperative x-ray to confirm our location utilizing  fluoroscopy.  We found the L4-5 facet joints to be severely hypertrophic and  degenerative.  Capsule was disrupted.  We identified the transverse  processes of L4 and 5 bilaterally.  I replaced deep retractors.  We then  performed our revision laminectomy by carefully dissecting through the  epidural fibrosis bilaterally.  We freed up the dura from the laminectomy  edges.  We then extended the laminectomy proximally.  We identified the L4  and 5 nerve roots bilaterally and confirmed that they were free out their  respective foramen.  On the left side we found the L5 nerve root to be  compressed within the lateral recess.  We then turned our attention to  performing a  transforaminal lumbar interbody fusion at L4-5.  On the left  side we performed a sharp annulotomy.  Bleeding was controlled with bipolar  electrocautery and Gelfoam. We then performed a radical diskectomy utilizing  specialized TLIF instruments.  We scraped the cartilaginous end plates.  We  irrigated the disk space.  We dilated the disk space to 14 mm.  We then  packed local autogenous bone graft that had been cleaned of soft tissue and  morselized into the interspace.  We placed a BMP sponge from the infuse  small kit anteriorly into the disk space.  We then placed a 14 mm PEEK  prosthetic cage packed with the second BMP sponge into the disk space.  This  was carefully tamped anterior and across the midline.  We then turned our  attention to placing pedicle screws at L4 and 5 bilaterally using anatomic  landmarks, fluoroscopy as well as direct palpation of the pedicles from  within the canal.  After placing each pedicle screw, we used triggered EMGs  and in each case had a response greater than 20mA consistent with  interosseous placement of the screws.  We also monitored free running EMGs  while placing the interbody graft and there were no deleterious changes.  We  placed 6.5 x 50 mm screws at L4 and 6.5 x 40 mm screws at L5.  We then  placed a short segment titanium rods bilaterally and applied gentle  compression across the interspace before shearing off the locking caps.  We  then turned our attention to performing a posterolateral arthrodesis on the  right side.  We used a high speed bur to decorticate the facet joint, pars  interarticularis and transverse processes of L4 and 5. We tightly packed the  remaining local autogenous morcelized graft into the lateral gutter. We  confirmed that the nerve roots were once again free. We placed Gelfoam over  the exposed epidural space. We closed the deep fascia with a running #1 Vicryl, closed the subcutaneous layer with 2-0 Vicryl and  approximated the  skin using 3-0 subcuticular Vicryl suture.  Benzoin and Steri-Strips were  placed.  Sterile dressing applied.  The patient was turned supine, extubated  without difficulty and transferred to the recovery room in stable condition  able  to move her upper and lower extremities.                                                Sharolyn Douglas, M.D.    MC/MEDQ  D:  07/12/2003  T:  07/12/2003  Job:  [id number]

## 2011-02-07 NOTE — Assessment & Plan Note (Signed)
[facility]                                 ON-CALL NOTE   NAME:Emma Rogers                          MRN:          [id number]  DATE:03/06/2007                            DOB:          1966-02-24    TELEPHONE NUMBER:  [phone]   SUBJECTIVE:  The patient slammed her finger in a car door and thinks it  might be broken. She was told to go to the emergency room to get an x-  ray.   OBJECTIVE:  Trauma to the finger.   PLAN:  To the ER for evaluation.     Arta Silence, MD  Electronically Signed    RNS/MedQ  DD: 03/06/2007  DT: 03/07/2007  Job #: [id number]

## 2011-02-07 NOTE — Assessment & Plan Note (Signed)
[facility]                                 ON-CALL NOTE   NAME:Emma Rogers                            MRN:          [id number]  DATE:12/05/2006                            DOB:          1965-11-23    PRIMARY CARE PHYSICIAN:  Dr. Clent Ridges.   The patient complains of a kidney infection. The patient was advised  to follow up at the Saturday clinic this morning for further assessment.  Unfortunately, the patient did not show up to the Saturday clinic.     Leanne Chang, M.D.  Electronically Signed    LA/MedQ  DD: 12/05/2006  DT: 12/07/2006  Job #: [id number]

## 2011-03-03 ENCOUNTER — Other Ambulatory Visit: Payer: Self-pay | Admitting: General Practice

## 2011-04-08 ENCOUNTER — Encounter: Payer: Self-pay | Admitting: Cardiovascular Disease

## 2011-12-09 ENCOUNTER — Ambulatory Visit: Payer: Self-pay | Admitting: General Practice

## 2012-02-11 ENCOUNTER — Telehealth: Payer: Self-pay | Admitting: Family Medicine

## 2012-02-11 NOTE — Telephone Encounter (Signed)
Pt req to re-est. Last seen in 2009 by Dr Clent Ridges. Pls advise.

## 2012-02-12 NOTE — Telephone Encounter (Signed)
Yes I can see her again  

## 2012-02-12 NOTE — Telephone Encounter (Signed)
Called pt and sch ov for tomorrow 02/13/12 at 9:45am as noted.

## 2012-02-13 ENCOUNTER — Encounter: Payer: Self-pay | Admitting: Family Medicine

## 2012-02-13 ENCOUNTER — Ambulatory Visit (INDEPENDENT_AMBULATORY_CARE_PROVIDER_SITE_OTHER): Payer: PRIVATE HEALTH INSURANCE | Admitting: Family Medicine

## 2012-02-13 VITALS — BP 132/84 | HR 101 | Temp 98.9°F | Wt 192.0 lb

## 2012-02-13 DIAGNOSIS — G43909 Migraine, unspecified, not intractable, without status migrainosus: Secondary | ICD-10-CM

## 2012-02-13 DIAGNOSIS — R7309 Other abnormal glucose: Secondary | ICD-10-CM

## 2012-02-13 DIAGNOSIS — F411 Generalized anxiety disorder: Secondary | ICD-10-CM

## 2012-02-13 DIAGNOSIS — F419 Anxiety disorder, unspecified: Secondary | ICD-10-CM

## 2012-02-13 DIAGNOSIS — Z950 Presence of cardiac pacemaker: Secondary | ICD-10-CM

## 2012-02-13 DIAGNOSIS — R739 Hyperglycemia, unspecified: Secondary | ICD-10-CM

## 2012-02-13 LAB — BASIC METABOLIC PANEL WITH GFR
BUN: 13 mg/dL (ref 6–23)
CO2: 26 meq/L (ref 19–32)
Calcium: 9 mg/dL (ref 8.4–10.5)
Chloride: 108 meq/L (ref 96–112)
Creatinine, Ser: 0.8 mg/dL (ref 0.4–1.2)
GFR: 81.35 mL/min
Glucose, Bld: 100 mg/dL — ABNORMAL HIGH (ref 70–99)
Potassium: 4.4 meq/L (ref 3.5–5.1)
Sodium: 139 meq/L (ref 135–145)

## 2012-02-13 LAB — CBC WITH DIFFERENTIAL/PLATELET
Basophils Relative: 0.6 % (ref 0.0–3.0)
Eosinophils Relative: 1.4 % (ref 0.0–5.0)
Lymphocytes Relative: 25.5 % (ref 12.0–46.0)
MCV: 93.2 fl (ref 78.0–100.0)
Monocytes Relative: 4.7 % (ref 3.0–12.0)
Neutrophils Relative %: 67.8 % (ref 43.0–77.0)
Platelets: 280 10*3/uL (ref 150.0–400.0)
RBC: 4.47 Mil/uL (ref 3.87–5.11)
WBC: 6.3 10*3/uL (ref 4.5–10.5)

## 2012-02-13 LAB — POCT URINALYSIS DIPSTICK
Bilirubin, UA: NEGATIVE
Blood, UA: NEGATIVE
Glucose, UA: NEGATIVE
Leukocytes, UA: NEGATIVE
Nitrite, UA: NEGATIVE
Urobilinogen, UA: 0.2

## 2012-02-13 LAB — LDL CHOLESTEROL, DIRECT: Direct LDL: 178.3 mg/dL

## 2012-02-13 LAB — HEPATIC FUNCTION PANEL
Albumin: 4.1 g/dL (ref 3.5–5.2)
Alkaline Phosphatase: 38 U/L — ABNORMAL LOW (ref 39–117)
Bilirubin, Direct: 0.1 mg/dL (ref 0.0–0.3)

## 2012-02-13 LAB — LIPID PANEL
Cholesterol: 233 mg/dL — ABNORMAL HIGH (ref 0–200)
HDL: 50.3 mg/dL
Total CHOL/HDL Ratio: 5
Triglycerides: 64 mg/dL (ref 0.0–149.0)
VLDL: 12.8 mg/dL (ref 0.0–40.0)

## 2012-02-13 LAB — HEMOGLOBIN A1C: Hgb A1c MFr Bld: 5.7 % (ref 4.6–6.5)

## 2012-02-13 LAB — TSH: TSH: 0.75 u[IU]/mL (ref 0.35–5.50)

## 2012-02-13 MED ORDER — TOPIRAMATE 50 MG PO TABS: 50.0000 mg | ORAL_TABLET | Freq: Every day | ORAL | Status: DC

## 2012-02-13 NOTE — Progress Notes (Signed)
  Subjective:    Patient ID: Emma Rogers, female    DOB: 11/04/1965, 46 y.o.   MRN: [id number]  HPI 46 yr old female to re-establish with [place] after an absence of 3 and 1/2 years. She is an employee of [employer], and she had been seeing their employee clinic during this time. She has done well in general, although she has been under a lot of stress. She and her husband have been separated for a year or so, and they are both seeing therapists. She is not sure if they can work things out or not. Her HAs have been more frequent lately. She gets good relief with Maxalt, but she is having to use it several times a week. She has used Topamax in the past, and she would like to try it again. She is dieting and exercising and has been able to lose 50 lbs in the past year.    Review of Systems  Constitutional: Negative.   Respiratory: Negative.   Cardiovascular: Negative.   Neurological: Positive for headaches.  Psychiatric/Behavioral: Negative for hallucinations, behavioral problems, confusion, sleep disturbance, dysphoric mood, decreased concentration and agitation. The patient is nervous/anxious. The patient is not hyperactive.        Objective:   Physical Exam  Constitutional: She appears well-developed and well-nourished.  Neck: No thyromegaly present.  Cardiovascular: Normal rate, regular rhythm, normal heart sounds and intact distal pulses.   Pulmonary/Chest: Effort normal and breath sounds normal.  Lymphadenopathy:    She has no cervical adenopathy.  Psychiatric: She has a normal mood and affect. Her behavior is normal. Thought content normal.          Assessment & Plan:  We will start her on Topamax 50 mg a day. Get fasting labs. We will have her see Dr. [name] again to check her pacemaker.

## 2012-02-18 NOTE — Progress Notes (Signed)
Quick Note:  I spoke with pt ______ 

## 2012-02-26 ENCOUNTER — Encounter: Payer: Self-pay | Admitting: *Deleted

## 2012-03-04 ENCOUNTER — Encounter: Payer: PRIVATE HEALTH INSURANCE | Admitting: Internal Medicine

## 2012-03-17 ENCOUNTER — Encounter: Payer: Self-pay | Admitting: Internal Medicine

## 2012-04-09 ENCOUNTER — Ambulatory Visit: Payer: PRIVATE HEALTH INSURANCE | Admitting: Family Medicine

## 2012-04-12 ENCOUNTER — Ambulatory Visit (INDEPENDENT_AMBULATORY_CARE_PROVIDER_SITE_OTHER): Payer: PRIVATE HEALTH INSURANCE | Admitting: Family Medicine

## 2012-04-12 ENCOUNTER — Encounter: Payer: Self-pay | Admitting: Family Medicine

## 2012-04-12 VITALS — BP 124/80 | HR 78 | Temp 98.4°F | Wt 184.0 lb

## 2012-04-12 DIAGNOSIS — G43109 Migraine with aura, not intractable, without status migrainosus: Secondary | ICD-10-CM

## 2012-04-12 DIAGNOSIS — G43909 Migraine, unspecified, not intractable, without status migrainosus: Secondary | ICD-10-CM

## 2012-04-12 MED ORDER — TOPIRAMATE 100 MG PO TABS: 100.0000 mg | ORAL_TABLET | Freq: Every day | ORAL | Status: DC

## 2012-04-12 MED ORDER — RIZATRIPTAN BENZOATE 10 MG PO TABS: 10.0000 mg | ORAL_TABLET | ORAL | Status: DC | PRN

## 2012-04-12 NOTE — Progress Notes (Signed)
  Subjective:    Patient ID: Emma Rogers, female    DOB: November 04, 1965, 46 y.o.   MRN: [id number]  HPI Here for migraines. She had been well controlled until 2 weeks ago, when the HAs started to get more frequent. She had daily HAs for about a week, and she took Maxalt 4 days last week. The Maxalt still works but the migraines would return the next day. Then 2 days ago she thought about increasing her Topamax to 2 tablets a day, and this has successfully stoppped the HAs. She asks of she can continue on this dose. Today she feels good.    Review of Systems  Constitutional: Negative.   Neurological: Positive for headaches. Negative for dizziness, tremors, seizures, syncope, facial asymmetry, speech difficulty, weakness, light-headedness and numbness.       Objective:   Physical Exam  Constitutional: She is oriented to person, place, and time. She appears well-developed and well-nourished.  Eyes: Conjunctivae and EOM are normal. Pupils are equal, round, and reactive to light.  Neurological: She is alert and oriented to person, place, and time. She has normal reflexes. No cranial nerve deficit. She exhibits normal muscle tone. Coordination normal.          Assessment & Plan:  We will increase her daily Topamax to 100 mg. Refilled maxalt. Recheck prn

## 2012-07-07 ENCOUNTER — Ambulatory Visit: Payer: Self-pay | Admitting: Obstetrics and Gynecology

## 2012-08-11 ENCOUNTER — Telehealth: Payer: Self-pay | Admitting: Family Medicine

## 2012-08-11 MED ORDER — FUROSEMIDE 20 MG PO TABS: 20.0000 mg | ORAL_TABLET | Freq: Every day | ORAL | Status: DC

## 2012-08-11 NOTE — Telephone Encounter (Signed)
I called in script and left voice message for pt. 

## 2012-08-11 NOTE — Telephone Encounter (Signed)
Patient calls following instruction from Dr. Clent Ridges to call back if she still wants a fluid pill after her labs is reviewed.  She does and declined triage due to instructions.  If Dr. Clent Ridges is still willing please send medication (Fluid pill) to [facility] at [phone].

## 2012-08-11 NOTE — Telephone Encounter (Signed)
No contact with patient.

## 2012-08-11 NOTE — Telephone Encounter (Signed)
Call in Lasix 20 mg qd prn fluid, #90 with 3 rf

## 2012-10-28 ENCOUNTER — Other Ambulatory Visit: Payer: Self-pay | Admitting: Family Medicine

## 2012-10-28 NOTE — Telephone Encounter (Signed)
Pt needs refill of rizatriptan (MAXALT) 10 MG tablet. Pt had migraine last night and has no med.   Pls send to [facility]  [phone]

## 2012-10-29 MED ORDER — RIZATRIPTAN BENZOATE 10 MG PO TABS: 10.0000 mg | ORAL_TABLET | ORAL | Status: DC | PRN

## 2012-10-29 NOTE — Telephone Encounter (Signed)
Call in #10 with 11 rf 

## 2012-11-01 NOTE — Telephone Encounter (Signed)
Rx called into pharmacy for Maxalt. Left message on personal voicemail that Rx was called into pharmacy.

## 2012-11-12 ENCOUNTER — Ambulatory Visit (INDEPENDENT_AMBULATORY_CARE_PROVIDER_SITE_OTHER): Payer: 59 | Admitting: Family Medicine

## 2012-11-12 ENCOUNTER — Encounter: Payer: Self-pay | Admitting: Family Medicine

## 2012-11-12 VITALS — BP 120/80 | HR 93 | Temp 98.2°F | Wt 171.0 lb

## 2012-11-12 DIAGNOSIS — J309 Allergic rhinitis, unspecified: Secondary | ICD-10-CM

## 2012-11-12 DIAGNOSIS — R51 Headache: Secondary | ICD-10-CM

## 2012-11-12 MED ORDER — TOPIRAMATE 100 MG PO TABS: 100.0000 mg | ORAL_TABLET | Freq: Every day | ORAL | Status: DC

## 2012-11-12 NOTE — Progress Notes (Signed)
  Subjective:    Patient ID: Emma Rogers, female    DOB: Dec 05, 1965, 47 y.o.   MRN: [id number]  HPI Here to discuss her migraines. She had been doing well last year on Topamax, and she actually stopped taking this 6 months ago. Now lately the HAs have become more frequent again. These seem to be triggered by sinus pressure, and this is very sensitive to weather changes outside.    Review of Systems  Constitutional: Negative.   HENT: Positive for congestion and sinus pressure.   Eyes: Negative.   Neurological: Positive for headaches.       Objective:   Physical Exam  Constitutional: She is oriented to person, place, and time. She appears well-developed and well-nourished.  Cardiovascular: Normal rate, regular rhythm, normal heart sounds and intact distal pulses.   Pulmonary/Chest: Effort normal and breath sounds normal.  Neurological: She is alert and oriented to person, place, and time. No cranial nerve deficit.          Assessment & Plan:  We will get back on Topamax 100 mg qhs. Try taking Claritin D daily to keep er sinuses clear.

## 2013-09-06 ENCOUNTER — Encounter: Payer: Self-pay | Admitting: Family Medicine

## 2013-09-06 ENCOUNTER — Ambulatory Visit (INDEPENDENT_AMBULATORY_CARE_PROVIDER_SITE_OTHER): Payer: 59 | Admitting: Family Medicine

## 2013-09-06 VITALS — BP 118/76 | HR 66 | Temp 98.0°F | Wt 192.0 lb

## 2013-09-06 DIAGNOSIS — F3289 Other specified depressive episodes: Secondary | ICD-10-CM

## 2013-09-06 DIAGNOSIS — F329 Major depressive disorder, single episode, unspecified: Secondary | ICD-10-CM

## 2013-09-06 DIAGNOSIS — R609 Edema, unspecified: Secondary | ICD-10-CM

## 2013-09-06 DIAGNOSIS — B029 Zoster without complications: Secondary | ICD-10-CM | POA: Insufficient documentation

## 2013-09-06 DIAGNOSIS — R600 Localized edema: Secondary | ICD-10-CM

## 2013-09-06 MED ORDER — VALACYCLOVIR HCL 1 G PO TABS: 1000.0000 mg | ORAL_TABLET | Freq: Three times a day (TID) | ORAL | Status: DC

## 2013-09-06 MED ORDER — FUROSEMIDE 40 MG PO TABS: 40.0000 mg | ORAL_TABLET | Freq: Every day | ORAL | Status: DC

## 2013-09-06 MED ORDER — CITALOPRAM HYDROBROMIDE 40 MG PO TABS: 40.0000 mg | ORAL_TABLET | Freq: Every day | ORAL | Status: DC

## 2013-09-06 NOTE — Progress Notes (Signed)
Pre visit review using our clinic review tool, if applicable. No additional management support is needed unless otherwise documented below in the visit note. 

## 2013-09-06 NOTE — Progress Notes (Signed)
   Subjective:    Patient ID: Emma Rogers, female    DOB: January 03, 1966, 47 y.o.   MRN: [id number]  HPI Here for several issues. First she separated from her husband in October and she has been taking Celexa 20 mg daily from a doctor at her job. She asks to increase this since she is still anxious and sad at times. She is seeing an EAP therapist also. She asks to increase the dose of her Lasix since her feet are swelling more. Also she has had recurrent patches of shingles on the right temple. She has been seeing [facility] Fransico Michael at the [facility], and he has given her a course of Acyclovir to keep it out of her eye. Now is has come back. It itches and is mildly painful. No vision problems.    Review of Systems  Constitutional: Negative.   HENT: Negative.   Eyes: Negative.   Cardiovascular: Positive for leg swelling. Negative for chest pain and palpitations.  Psychiatric/Behavioral: Positive for dysphoric mood and decreased concentration. The patient is nervous/anxious.        Objective:   Physical Exam  Constitutional: She appears well-developed and well-nourished.  HENT:  Small patch of red vesicles on the right temple  Eyes: Conjunctivae are normal. Pupils are equal, round, and reactive to light.  Psychiatric: She has a normal mood and affect. Her behavior is normal. Thought content normal.          Assessment & Plan:  Treat the shingles with 10 days of Valtrex 1000 mg tid. After that we will change to 1000 mg once daily for suppression for 3 months. Increase celexa to 40 mg daily. Increase Lasix to 40 mg daily.

## 2013-11-18 ENCOUNTER — Other Ambulatory Visit: Payer: Self-pay | Admitting: Family Medicine

## 2014-03-03 ENCOUNTER — Other Ambulatory Visit: Payer: Self-pay | Admitting: Family Medicine

## 2014-06-15 ENCOUNTER — Emergency Department: Payer: Self-pay | Admitting: Student

## 2014-06-15 LAB — CBC
HCT: 45.2 % (ref 35.0–47.0)
HGB: 15.3 g/dL (ref 12.0–16.0)
MCH: 31.1 pg (ref 26.0–34.0)
MCHC: 33.7 g/dL (ref 32.0–36.0)
MCV: 92 fL (ref 80–100)
PLATELETS: 259 10*3/uL (ref 150–440)
RBC: 4.91 10*6/uL (ref 3.80–5.20)
RDW: 13 % (ref 11.5–14.5)
WBC: 16.6 10*3/uL — AB (ref 3.6–11.0)

## 2014-06-15 LAB — BASIC METABOLIC PANEL
ANION GAP: 6 — AB (ref 7–16)
BUN: 15 mg/dL (ref 7–18)
CO2: 31 mmol/L (ref 21–32)
Calcium, Total: 9.2 mg/dL (ref 8.5–10.1)
Chloride: 98 mmol/L (ref 98–107)
Creatinine: 0.85 mg/dL (ref 0.60–1.30)
EGFR (African American): >60
EGFR (Non-African Amer.): >60
GLUCOSE: 122 mg/dL — AB (ref 65–99)
OSMOLALITY: 272 (ref 275–301)
Potassium: 3.3 mmol/L — ABNORMAL LOW (ref 3.5–5.1)
Sodium: 135 mmol/L — ABNORMAL LOW (ref 136–145)

## 2014-06-16 LAB — TROPONIN I: Troponin-I: <0.02 ng/mL

## 2014-06-19 ENCOUNTER — Telehealth: Payer: Self-pay | Admitting: Family Medicine

## 2014-06-19 NOTE — Telephone Encounter (Signed)
Patient Information:  Caller Name: Emma Rogers  Phone: [phone]  Patient: Emma Rogers, Emma Rogers  Gender: Female  DOB: 1966-09-03  Age: 48 Years  PCP: Emma Rogers [facility])  Pregnant: No  Office Follow Up:  Does the office need to follow up with this patient?: No  Instructions For The Office: N/A  RN Note:  LMP: Hysterectomy 2009. Patient states she has history of Migraine headaches. Patient states she developed a severe headache and vomiting, onset 06/15/14. States the migraine on 06/15/14 was the worst migraine she has ever had.  Patient states she developed palpitations at 2100 06/15/14. States palpitations lasted approx. 2 hours and resolved. Patient states she went to the ED at [facility] 06/15/14. States she had an EKG and labwork completed in triage. Patient states palpitations resolved prior to obtaining EKG. Patient states she did not stay for an evaluation in the ED. Patient states she does have a pacemaker. Patient states headache improved while in the ED 06/15/14. Patient states she continues to have a dull headache behind her right eye at a " 3" on a 1-10 scale. Patient states palpitations have not recurred. Patient states she feels "very sluggish" and fatigued. Patient states she developed a heaviness in her sternal area and under left breast, onset 0730 06/19/14. States discomfort is constant. Denies dyspnea, diaphorsis, dizziness or nausea. States discomfort is at a "5-6" on 1-10 scale. Care advice given per guidelines. Patient advised to call 911 immediately. Patient verbalizes understanding and agreeable. Patient is at work and is not alone.  Symptoms  Reason For Call & Symptoms: Headache, Palpitations, Fatigue, Chest pain  Reviewed Health History In EMR: Yes  Reviewed Medications In EMR: Yes  Reviewed Allergies In EMR: Yes  Reviewed Surgeries / Procedures: Yes  Date of Onset of Symptoms: 06/15/2014 OB / GYN:  LMP: Unknown  Guideline(s) Used:  Chest  Pain  Disposition Per Guideline:   Call EMS 911 Now  Reason For Disposition Reached:   Chest pain lasting longer than 5 minutes and ANY of the following:  Over 68 years old Over 45 years old and at least one cardiac risk factor (i.e., high blood pressure, diabetes, high cholesterol, obesity, smoker or strong family history of heart disease) Pain is crushing, pressure-like, or heavy  Took nitroglycerin and chest pain was not relieved History of heart disease (i.e., angina, heart attack, bypass surgery, angioplasty, CHF)  Advice Given:  N/A  Patient Will Follow Care Advice:  YES

## 2014-06-19 NOTE — Telephone Encounter (Signed)
noted 

## 2014-08-16 ENCOUNTER — Other Ambulatory Visit: Payer: Self-pay | Admitting: Family Medicine

## 2014-08-21 ENCOUNTER — Other Ambulatory Visit: Payer: Self-pay | Admitting: Family Medicine

## 2014-09-27 ENCOUNTER — Other Ambulatory Visit: Payer: Self-pay | Admitting: Family Medicine

## 2014-09-27 NOTE — Telephone Encounter (Signed)
Can we refill this? Does pt need appointment? 

## 2014-09-28 NOTE — Telephone Encounter (Signed)
Call in #90 only. She needs an OV soon  

## 2014-11-03 ENCOUNTER — Other Ambulatory Visit: Payer: Self-pay | Admitting: Family Medicine

## 2014-12-06 ENCOUNTER — Other Ambulatory Visit: Payer: Self-pay | Admitting: Family Medicine

## 2014-12-12 NOTE — Telephone Encounter (Signed)
Call in #90 with 3 rf  

## 2014-12-13 MED ORDER — TOPIRAMATE 100 MG PO TABS: 100.0000 mg | ORAL_TABLET | Freq: Every day | ORAL | Status: DC

## 2015-01-26 ENCOUNTER — Ambulatory Visit: Payer: 59 | Admitting: Family Medicine

## 2015-01-26 DIAGNOSIS — Z0289 Encounter for other administrative examinations: Secondary | ICD-10-CM

## 2015-01-30 ENCOUNTER — Other Ambulatory Visit: Payer: Self-pay | Admitting: Family Medicine

## 2015-01-30 NOTE — Telephone Encounter (Signed)
Last OV 08/2013. Last filled 09/28/2014. No appointment scheduled

## 2015-03-09 ENCOUNTER — Other Ambulatory Visit: Payer: Self-pay | Admitting: Family Medicine

## 2015-04-17 ENCOUNTER — Encounter: Payer: Self-pay | Admitting: Obstetrics and Gynecology

## 2015-04-25 ENCOUNTER — Encounter: Payer: Self-pay | Admitting: Gastroenterology

## 2015-07-03 ENCOUNTER — Ambulatory Visit: Payer: Self-pay | Admitting: Physician Assistant

## 2015-07-03 ENCOUNTER — Encounter: Payer: Self-pay | Admitting: Physician Assistant

## 2015-07-03 VITALS — BP 116/74 | Temp 98.5°F | Wt 183.0 lb

## 2015-07-03 DIAGNOSIS — B029 Zoster without complications: Secondary | ICD-10-CM

## 2015-07-03 MED ORDER — ACYCLOVIR 5 % EX OINT: 1.0000 "application " | TOPICAL_OINTMENT | CUTANEOUS | Status: DC

## 2015-07-03 MED ORDER — VALACYCLOVIR HCL 1 G PO TABS: 1000.0000 mg | ORAL_TABLET | Freq: Three times a day (TID) | ORAL | Status: DC

## 2015-07-03 MED ORDER — ACYCLOVIR 5 % EX OINT: 1.0000 "application " | TOPICAL_OINTMENT | Freq: Every day | CUTANEOUS | Status: DC

## 2015-07-03 NOTE — Progress Notes (Signed)
S/ painful , pruritic rash recurrant to R face , with malaise, stressed H/o shingles 2 yrs ago in this location,no visual complaints  O/ alert pleasant  VSS  Perrla , eoms full conjunctivae and sclerae clear  tms clear pharnx clear  confluent cluster of red papulovesicular lesions to R [place]  A/ shingles P/ rx valtrex , zovirax, declines pain med. Supportive measures. If sxs not responding or eye sxs she should seek urgent care with eye doctor.

## 2015-07-03 NOTE — Patient Instructions (Signed)

## 2015-07-23 ENCOUNTER — Encounter: Payer: Self-pay | Admitting: Physician Assistant

## 2015-07-23 ENCOUNTER — Ambulatory Visit: Payer: Self-pay | Admitting: Family

## 2015-07-23 VITALS — BP 122/84 | HR 80 | Temp 99.1°F

## 2015-07-23 DIAGNOSIS — R300 Dysuria: Secondary | ICD-10-CM

## 2015-07-23 DIAGNOSIS — N390 Urinary tract infection, site not specified: Secondary | ICD-10-CM

## 2015-07-23 DIAGNOSIS — R319 Hematuria, unspecified: Principal | ICD-10-CM

## 2015-07-23 LAB — POCT URINALYSIS DIPSTICK
Bilirubin, UA: NEGATIVE
Glucose, UA: NEGATIVE
KETONES UA: NEGATIVE
PH UA: 6.5
Spec Grav, UA: 1.015
UROBILINOGEN UA: 0.2

## 2015-07-23 MED ORDER — NITROFURANTOIN MONOHYD MACRO 100 MG PO CAPS: 100.0000 mg | ORAL_CAPSULE | Freq: Two times a day (BID) | ORAL | Status: DC

## 2015-07-23 MED ORDER — PHENAZOPYRIDINE HCL 200 MG PO TABS: 200.0000 mg | ORAL_TABLET | Freq: Three times a day (TID) | ORAL | Status: DC | PRN

## 2015-07-23 NOTE — Progress Notes (Signed)
S/ dysuria , frequency , malodorous urine,  abd pain Took a colon cleans preceding sxs   O/ looks mildly ill,  Low grade temp   Abd L CVA and suprapubic tenderness without guarding Results for orders placed or performed in visit on 07/23/15  POCT Urinalysis Dipstick (CPT 81002)  Result Value Ref Range   Color, UA other    Clarity, UA sloghty cloudy    Glucose, UA neg    Bilirubin, UA neg    Ketones, UA neg    Spec Grav, UA 1.015    Blood, UA 1+    pH, UA 6.5    Protein, UA 1+    Urobilinogen, UA 0.2    NEG CONTROL     Leukocytes, UA moderate (2+) (A) Negative   Urinary tract infection with hematuria, site unspecified  Dysuria - Plan: POCT Urinalysis Dipstick (CPT 81002)  RX macrobid . Pyridium . See orders . RTC 2-3 days after rx for recheck dip  Or sooner if sxs not improving.

## 2015-08-07 ENCOUNTER — Encounter: Payer: Self-pay | Admitting: Physician Assistant

## 2015-08-07 ENCOUNTER — Ambulatory Visit: Payer: Self-pay | Admitting: Physician Assistant

## 2015-08-07 VITALS — BP 118/82 | HR 60 | Temp 98.8°F

## 2015-08-07 DIAGNOSIS — J4520 Mild intermittent asthma, uncomplicated: Secondary | ICD-10-CM

## 2015-08-07 DIAGNOSIS — R300 Dysuria: Secondary | ICD-10-CM

## 2015-08-07 DIAGNOSIS — J069 Acute upper respiratory infection, unspecified: Secondary | ICD-10-CM

## 2015-08-07 LAB — POCT URINALYSIS DIPSTICK
Bilirubin, UA: NEGATIVE
Glucose, UA: NEGATIVE
Ketones, UA: NEGATIVE
Leukocytes, UA: NEGATIVE
Nitrite, UA: NEGATIVE
PH UA: 6
PROTEIN UA: NEGATIVE
RBC UA: NEGATIVE
SPEC GRAV UA: 1.025
Urobilinogen, UA: 0.2

## 2015-08-07 MED ORDER — LEVOFLOXACIN 500 MG PO TABS: 500.0000 mg | ORAL_TABLET | Freq: Every day | ORAL | Status: DC

## 2015-08-07 MED ORDER — PREDNISONE 20 MG PO TABS: 40.0000 mg | ORAL_TABLET | Freq: Every day | ORAL | Status: DC

## 2015-08-07 MED ORDER — FLUCONAZOLE 150 MG PO TABS: 150.0000 mg | ORAL_TABLET | Freq: Once | ORAL | Status: DC

## 2015-08-07 NOTE — Progress Notes (Signed)
S/ cold sx over a week, were getting better , now worse with HA , increased RAD sxs  using albuterol inhaler.   Also here to repeat u/a p rx  O/ VSS  Mildly ill, hoarse ENT + frontal tenderness, nasal mucosa red , swollen, throat clear neck supple heart rsr lungs clear   A/ URI RAD  UTI resolved  P / ADd rx of  levaquin, pred taper , diflucan , see orders supportive care .continue albuterol prn Hydration. If sxs not improving seek urgent care.

## 2015-12-12 ENCOUNTER — Ambulatory Visit: Payer: Self-pay | Admitting: Physician Assistant

## 2015-12-12 ENCOUNTER — Encounter: Payer: Self-pay | Admitting: Physician Assistant

## 2015-12-12 VITALS — BP 122/80 | Temp 98.4°F

## 2015-12-12 DIAGNOSIS — J069 Acute upper respiratory infection, unspecified: Secondary | ICD-10-CM

## 2015-12-12 MED ORDER — HYDROCOD POLST-CPM POLST ER 10-8 MG/5ML PO SUER: 5.0000 mL | Freq: Two times a day (BID) | ORAL | Status: DC | PRN

## 2015-12-12 MED ORDER — METHYLPREDNISOLONE 4 MG PO TBPK: ORAL_TABLET | ORAL | Status: AC

## 2015-12-12 MED ORDER — FLUTICASONE PROPIONATE 50 MCG/ACT NA SUSP: 2.0000 | Freq: Every day | NASAL | Status: DC

## 2015-12-12 MED ORDER — CEFDINIR 300 MG PO CAPS: 300.0000 mg | ORAL_CAPSULE | Freq: Two times a day (BID) | ORAL | Status: AC

## 2015-12-12 NOTE — Progress Notes (Signed)
S: C/o dry cough for 1 week, a little of the mucus was yellow, no fever, chills, cp/sob, v/d or body aches;  Has started having a headache from the cough,  Using tessalon perls for cough but not helping at night  Using otc meds: alka seltzer  O: PE: vitals wnl, nad, perrl eomi, normocephalic, tms dull, nasal mucosa red and swollen, throat injected, neck supple no lymph, lungs c t a, cv rrr, neuro intact  A:  Acute uri   P: omnicef, tussionex, medrol dose pack if cough not better in 2 days, flonase; drink fluids, continue regular meds , use otc meds of choice, return if not improving in 5 days, return earlier if worsening

## 2016-03-28 ENCOUNTER — Ambulatory Visit: Payer: Self-pay | Admitting: Physician Assistant

## 2016-03-28 ENCOUNTER — Encounter: Payer: Self-pay | Admitting: Physician Assistant

## 2016-03-28 VITALS — BP 122/90 | HR 60 | Temp 98.6°F

## 2016-03-28 DIAGNOSIS — R5383 Other fatigue: Secondary | ICD-10-CM

## 2016-03-28 NOTE — Progress Notes (Signed)
S: c/o being fatigued, really tired, hard to walk from back of parking lot to her office, feels tightness in center of chest, had viral illness last week, feels better from that, just concerned as she is tired, no cp/sob, has pacemaker  O: vitals wnl, nad, appears well, ent wnl neck supple no lymph, lungs c t a, cv rrr, ekg same as 2011  A: fatigue  P: f/u with pcp on Monday for eval if not better, use otc b12, go to ER if any cp/sob or increased fatigued

## 2016-04-02 ENCOUNTER — Encounter: Payer: Self-pay | Admitting: Physician Assistant

## 2016-04-02 ENCOUNTER — Ambulatory Visit: Payer: Self-pay | Admitting: Physician Assistant

## 2016-04-02 ENCOUNTER — Emergency Department
Admission: EM | Admit: 2016-04-02 | Discharge: 2016-04-02 | Disposition: A | Discharge status: Home / Self Care | Payer: 59 | Attending: Emergency Medicine | Admitting: Emergency Medicine

## 2016-04-02 ENCOUNTER — Emergency Department: Payer: 59

## 2016-04-02 ENCOUNTER — Encounter: Payer: Self-pay | Admitting: Emergency Medicine

## 2016-04-02 VITALS — BP 110/74 | HR 60 | Temp 98.3°F

## 2016-04-02 DIAGNOSIS — R110 Nausea: Secondary | ICD-10-CM | POA: Diagnosis not present

## 2016-04-02 DIAGNOSIS — Z7951 Long term (current) use of inhaled steroids: Secondary | ICD-10-CM | POA: Insufficient documentation

## 2016-04-02 DIAGNOSIS — F329 Major depressive disorder, single episode, unspecified: Secondary | ICD-10-CM | POA: Insufficient documentation

## 2016-04-02 DIAGNOSIS — J45909 Unspecified asthma, uncomplicated: Secondary | ICD-10-CM | POA: Diagnosis not present

## 2016-04-02 DIAGNOSIS — R1013 Epigastric pain: Secondary | ICD-10-CM | POA: Insufficient documentation

## 2016-04-02 DIAGNOSIS — R079 Chest pain, unspecified: Secondary | ICD-10-CM | POA: Diagnosis not present

## 2016-04-02 LAB — COMPREHENSIVE METABOLIC PANEL
ALT: 25 U/L (ref 14–54)
ANION GAP: 7 (ref 5–15)
AST: 24 U/L (ref 15–41)
Albumin: 4.4 g/dL (ref 3.5–5.0)
Alkaline Phosphatase: 48 U/L (ref 38–126)
BUN: 21 mg/dL — ABNORMAL HIGH (ref 6–20)
CALCIUM: 9.1 mg/dL (ref 8.9–10.3)
CHLORIDE: 102 mmol/L (ref 101–111)
CO2: 29 mmol/L (ref 22–32)
Creatinine, Ser: 0.88 mg/dL (ref 0.44–1.00)
Glucose, Bld: 115 mg/dL — ABNORMAL HIGH (ref 65–99)
Potassium: 3.6 mmol/L (ref 3.5–5.1)
SODIUM: 138 mmol/L (ref 135–145)
Total Bilirubin: 0.3 mg/dL (ref 0.3–1.2)
Total Protein: 7.7 g/dL (ref 6.5–8.1)

## 2016-04-02 LAB — URINALYSIS COMPLETE WITH MICROSCOPIC (ARMC ONLY)
BILIRUBIN URINE: NEGATIVE
Bacteria, UA: NONE SEEN
Glucose, UA: NEGATIVE mg/dL
Hgb urine dipstick: NEGATIVE
Leukocytes, UA: NEGATIVE
Nitrite: NEGATIVE
Protein, ur: NEGATIVE mg/dL
Specific Gravity, Urine: 1.024 (ref 1.005–1.030)
Squamous Epithelial / LPF: NONE SEEN
pH: 5 (ref 5.0–8.0)

## 2016-04-02 LAB — CBC
HCT: 41.9 % (ref 35.0–47.0)
HEMOGLOBIN: 14.4 g/dL (ref 12.0–16.0)
MCH: 32 pg (ref 26.0–34.0)
MCHC: 34.4 g/dL (ref 32.0–36.0)
MCV: 92.9 fL (ref 80.0–100.0)
Platelets: 232 10*3/uL (ref 150–440)
RBC: 4.51 MIL/uL (ref 3.80–5.20)
RDW: 13.3 % (ref 11.5–14.5)
WBC: 8.4 10*3/uL (ref 3.6–11.0)

## 2016-04-02 LAB — TROPONIN I

## 2016-04-02 LAB — LIPASE, BLOOD: LIPASE: 38 U/L (ref 11–51)

## 2016-04-02 MED ORDER — LIDOCAINE VISCOUS 2 % MT SOLN: 5.0000 mL | Freq: Four times a day (QID) | OROMUCOSAL | Status: DC | PRN

## 2016-04-02 MED ORDER — ONDANSETRON HCL 4 MG/2ML IJ SOLN
4.0000 mg | Freq: Once | INTRAMUSCULAR | Status: AC
  Administered 2016-04-02: 4 mg via INTRAVENOUS
  Filled 2016-04-02: qty 2

## 2016-04-02 MED ORDER — SODIUM CHLORIDE 0.9 % IV BOLUS (SEPSIS)
1000.0000 mL | Freq: Once | INTRAVENOUS | Status: AC
  Administered 2016-04-02: 1000 mL via INTRAVENOUS

## 2016-04-02 MED ORDER — GI COCKTAIL ~~LOC~~
30.0000 mL | Freq: Once | ORAL | Status: AC
  Administered 2016-04-02: 30 mL via ORAL
  Filled 2016-04-02: qty 30

## 2016-04-02 MED ORDER — FAMOTIDINE 20 MG PO TABS: 20.0000 mg | ORAL_TABLET | Freq: Two times a day (BID) | ORAL | Status: DC

## 2016-04-02 MED ORDER — ONDANSETRON HCL 4 MG PO TABS: 4.0000 mg | ORAL_TABLET | Freq: Every day | ORAL | Status: DC | PRN

## 2016-04-02 MED ORDER — FAMOTIDINE IN NACL 20-0.9 MG/50ML-% IV SOLN
20.0000 mg | Freq: Once | INTRAVENOUS | Status: AC
  Administered 2016-04-02: 20 mg via INTRAVENOUS
  Filled 2016-04-02: qty 50

## 2016-04-02 MED ORDER — PSEUDOEPH-BROMPHEN-DM 30-2-10 MG/5ML PO SYRP: 5.0000 mL | ORAL_SOLUTION | Freq: Four times a day (QID) | ORAL | Status: DC | PRN

## 2016-04-02 NOTE — ED Notes (Signed)
Pt presents to ED with reports of LUQ abdominal pain radiating to her back that began last night. Pt reports was [facility] and told she needs to be evaluated for pancreatitis. Pt reports nausea but no vomiting, denies diarrhea.

## 2016-04-02 NOTE — Progress Notes (Signed)
   Subjective:epigastric pain    Patient ID: Emma Rogers, female    DOB: 21-Mar-1966, 50 y.o.   MRN: [id number]  HPI Patient c/o mid epigastric pain radiating to back for 2 days. Patient states pain improve slightly with eating for 15-20 minutes then back to baseline discomfort. Patient describes the pain as sharp and cramping. States nausea without vomiting. Bowel movements are normal.No palliative measures for complaint. Denies excessive ETOH usage.    Review of Systems    Hypertension and Depression Objective:   Physical Exam No acute distress. Normoactive bowel sounds. Guarding with palpation mid epigastric area.       Assessment & Plan:Mid epigastric pain.  Advised further evaluation by ER for compliant.

## 2016-04-02 NOTE — ED Provider Notes (Addendum)
[facility] Emergency Department Provider Note  ____________________________________________  Time seen: Approximately 5:42 PM  I have reviewed the triage vital signs and the nursing notes.   HISTORY  Chief Complaint Abdominal Pain   HPI Emma Rogers is a 50 y.o. female with a history of neurogenic syncope s/p pacemaker, migraines and asthma who presents for evaluation of abdominal pain. Patient reports the pain started yesterday evening. She describes the pain as a pressure located in her epigastric region, intermittent, occasionally radiating to her back, associated with nausea. She reports that the pain gets better when she eats but about 20 minutes later it gets worse. Currently her pain is mild to moderate. She denies ever having similar pain. She denies vomiting, diarrhea, constipation, dysuria, hematuria, chest pain, shortness of breath, fever. Patient's passing flatus. Her only prior abdominal surgery was a hysterectomy. Last bowel movement was today. She denies abdominal distention. She denies melena or prior history of GI bleed. Patient reports that she took 2 Aleve yesterday for a migraine but does not take NSAIDs regularly. She drinks alcohol occasionally. She reports that the pain is not pleuritic. She denies personal history of blood clots. She reports her father recently had a PE in the setting of a surgery. She denies any exogenous hormones, recent travel or immobilization, leg pain or swelling. She has not tried anything for the pain. She went to employee clinic and was sent here to rule out pancreatitis.  Past Medical History  Diagnosis Date  . Allergic rhinitis   . Asthma   . Depression   . [id number](272.5Headache(784.0)     Patient Active Problem List   Diagnosis Date Noted  . Shingles 09/06/2013  . 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 VASOVAGAL SYNCOPE 11/27/2009  . CHEST PAIN UNSPECIFIED 11/27/2009  . HYPERTENSION 07/18/2008  . DEPRESSION 01/18/2008  . ACUTE  SINUSITIS, UNSPECIFIED 01/18/2008  . ALLERGIC RHINITIS 01/18/2008  . ASTHMA 01/18/2008  . HEADACHE 01/18/2008    Past Surgical History  Procedure Laterality Date  . Lobe hemithroidectomy      Right  . Insert / replace / remove pacemaker      Permanent pacemaker, Dr. Graciela HusbandsKlein    Current Outpatient Rx  Name  Route  Sig  Dispense  Refill  . albuterol (PROVENTIL HFA) 108 (90 BASE) MCG/ACT inhaler   Inhalation   Inhale 2 puffs into the lungs every 4 (four) hours as needed. For shortness of breath.          . furosemide (LASIX) 40 MG tablet   Oral   Take 40 mg by mouth daily.         Marland Kitchen. lidocaine (XYLOCAINE) 2 % solution   Mouth/Throat   Use as directed 5 mLs in the mouth or throat every 6 (six) hours as needed for mouth pain. Mix with 5 ml of Bromfed DM for swish and swallow   100 mL   0   . rizatriptan (MAXALT) 10 MG tablet   Oral   Take 10 mg by mouth as needed for migraine. May repeat in 2 hours if needed         . brompheniramine-pseudoephedrine-DM 30-2-10 MG/5ML syrup   Oral   Take 5 mLs by mouth 4 (four) times daily as needed. Mix with 5 ml of viscous lidocaine for swish and swallow   120 mL   0   . famotidine (PEPCID) 20 MG tablet   Oral   Take 1 tablet (20 mg total) by mouth 2 (two) times  daily.   60 tablet   1   . ondansetron (ZOFRAN) 4 MG tablet   Oral   Take 1 tablet (4 mg total) by mouth daily as needed for nausea or vomiting.   20 tablet   0     Allergies Amoxicillin; Clavulanic acid; Ibuprofen; and Morphine and related  Family History  Problem Relation Age of Onset  . Asthma Other   . Coronary artery disease Other     Female 1st degree relative <50  . Lung cancer Other   . Prostate cancer Other     1st degree relative <50    Social History Social History  Substance Use Topics  . Smoking status: Never Smoker   . Smokeless tobacco: Never Used  . Alcohol Use: 0.0 oz/week    0 Standard drinks or equivalent per week     Comment: occ     Review of Systems  Constitutional: Negative for fever. Eyes: Negative for visual changes. ENT: Negative for sore throat. Cardiovascular: Negative for chest pain. Respiratory: Negative for shortness of breath. Gastrointestinal: + epigastric abdominal pain and nausea. No vomiting or diarrhea. Genitourinary: Negative for dysuria. Musculoskeletal: Negative for back pain. Skin: Negative for rash. Neurological: Negative for headaches, weakness or numbness.  ____________________________________________   PHYSICAL EXAM:  VITAL SIGNS: ED Triage Vitals  Enc Vitals Group     BP 04/02/16 1521 135/77 mmHg     Pulse Rate 04/02/16 1521 62     Resp 04/02/16 1521 16     Temp 04/02/16 1521 98.1 F (36.7 C)     Temp Source 04/02/16 1521 Oral     SpO2 04/02/16 1521 99 %     Weight 04/02/16 1521 185 lb (83.915 kg)     Height 04/02/16 1521 5\' 6"  (1.676 m)     Head Cir --      Peak Flow --      Pain Score 04/02/16 1532 7     Pain Loc --      Pain Edu? --      Excl. in GC? --     Constitutional: Alert and oriented. Well appearing and in no apparent distress. HEENT:      Head: Normocephalic and atraumatic.         Eyes: Conjunctivae are normal. Sclera is non-icteric. EOMI. PERRL      Mouth/Throat: Mucous membranes are moist.       Neck: Supple with no signs of meningismus. Cardiovascular: Regular rate and rhythm. No murmurs, gallops, or rubs. 2+ symmetrical distal pulses are present in all extremities. No JVD. Respiratory: Normal respiratory effort. Lungs are clear to auscultation bilaterally. No wheezes, crackles, or rhonchi.  Gastrointestinal: Soft, tender to palpation on the epigastric region, non distended with positive bowel sounds. No rebound or guarding. No CVA tenderness. Musculoskeletal: Nontender with normal range of motion in all extremities. No edema, cyanosis, or erythema of extremities. Neurologic: Normal speech and language. Face is symmetric. Moving all extremities. No  gross focal neurologic deficits are appreciated. Skin: Skin is warm, dry and intact. No rash noted. Psychiatric: Mood and affect are normal. Speech and behavior are normal.  ____________________________________________   LABS (all labs ordered are listed, but only abnormal results are displayed)  Labs Reviewed  COMPREHENSIVE METABOLIC PANEL - Abnormal; Notable for the following:    Glucose, Bld 115 (*)    BUN 21 (*)    All other components within normal limits  URINALYSIS COMPLETEWITH MICROSCOPIC ([facility] ONLY) - Abnormal; Notable for the following:  Color, Urine YELLOW (*)    APPearance CLEAR (*)    Ketones, ur TRACE (*)    All other components within normal limits  LIPASE, BLOOD  CBC  TROPONIN I   ____________________________________________  EKG  ED ECG REPORT I, Nita Sickle, the attending physician, personally viewed and interpreted this ECG.  Normal sinus rhythm, rate of 64, normal intervals, normal axis, no ST elevations or depressions. Normal EKG ____________________________________________  RADIOLOGY  CXR: negative ____________________________________________   PROCEDURES  Procedure(s) performed: None Critical Care performed:  None ____________________________________________   INITIAL IMPRESSION / ASSESSMENT AND PLAN / ED COURSE  50 y.o. female with a history of neurogenic syncope s/p pacemaker, migraines and asthma who presents for evaluation of epigastric abdominal pain intermittent since yesterday evening which improves after patient eats. On exam she is well appearing, no distress, her vital signs are within normal limits. Her labs show normal white count, normal CMP and lipase, negative UA. EKG is nonischemic, troponin is negative. Her abdominal exam shows tenderness to palpation on the epigastric region however no rebound or guarding. This most likely peptic ulcer versus gastritis. Patient had prior abdominal ultrasound in 2010 and CT scan with no  evidence of gallstones at that time. Also normal lipase making pancreatitis less likely. No evidence of SBO on exam and on history. PERC negative. Plan for GI cocktail, IV pepcid, IV zofran, IVF and reassess. No indication for imaging at this time with benign exam and labs.  _________________________ 7:26 PM on 04/02/2016 -----------------------------------------  Serial abdominal exams reassuring in the emergency department. Patient reports markedly improvement of her symptoms. Recommended close follow-up with her primary care doctor tomorrow if she is still having pain for reevaluation. Also instructed patient to return to the emergency department if her pain gets worse. We will discharge patient home with Pepcid, Zofran, and refer patient to gastroenterology.   Pertinent labs & imaging results that were available during my care of the patient were reviewed by me and considered in my medical decision making (see chart for details).    ____________________________________________   FINAL CLINICAL IMPRESSION(S) / ED DIAGNOSES  Final diagnoses:  Epigastric abdominal pain  Nausea      NEW MEDICATIONS STARTED DURING THIS VISIT:  New Prescriptions   BROMPHENIRAMINE-PSEUDOEPHEDRINE-DM 30-2-10 MG/5ML SYRUP    Take 5 mLs by mouth 4 (four) times daily as needed. Mix with 5 ml of viscous lidocaine for swish and swallow   FAMOTIDINE (PEPCID) 20 MG TABLET    Take 1 tablet (20 mg total) by mouth 2 (two) times daily.   LIDOCAINE (XYLOCAINE) 2 % SOLUTION    Use as directed 5 mLs in the mouth or throat every 6 (six) hours as needed for mouth pain. Mix with 5 ml of Bromfed DM for swish and swallow   ONDANSETRON (ZOFRAN) 4 MG TABLET    Take 1 tablet (4 mg total) by mouth daily as needed for nausea or vomiting.     Note:  This document was prepared using Dragon voice recognition software and may include unintentional dictation errors.    Nita Sickle, MD 04/02/16 1931  Nita Sickle,  MD 04/02/16 [phone]

## 2016-04-02 NOTE — ED Notes (Signed)
MD at bedside. 

## 2016-04-02 NOTE — Addendum Note (Signed)
Addended by: Joni ReiningSMITH, Starlena Beil K on: 04/02/2016 03:13 PM   Modules accepted: Orders

## 2016-04-02 NOTE — Discharge Instructions (Signed)

## 2016-04-02 NOTE — ED Notes (Signed)
Patient at XR

## 2016-05-01 ENCOUNTER — Ambulatory Visit (INDEPENDENT_AMBULATORY_CARE_PROVIDER_SITE_OTHER): Payer: 59 | Admitting: Family Medicine

## 2016-05-01 ENCOUNTER — Encounter: Payer: Self-pay | Admitting: Family Medicine

## 2016-05-01 VITALS — BP 104/72 | HR 72 | Temp 98.5°F | Resp 16 | Ht 64.5 in | Wt 196.0 lb

## 2016-05-01 DIAGNOSIS — G43009 Migraine without aura, not intractable, without status migrainosus: Secondary | ICD-10-CM | POA: Diagnosis not present

## 2016-05-01 DIAGNOSIS — R5383 Other fatigue: Secondary | ICD-10-CM

## 2016-05-01 MED ORDER — RIZATRIPTAN BENZOATE 10 MG PO TABS: 10.0000 mg | ORAL_TABLET | ORAL | 12 refills | Status: DC | PRN

## 2016-05-01 MED ORDER — TOPIRAMATE 25 MG PO TABS: 25.0000 mg | ORAL_TABLET | Freq: Every day | ORAL | 12 refills | Status: DC

## 2016-05-01 MED ORDER — FUROSEMIDE 40 MG PO TABS: 40.0000 mg | ORAL_TABLET | Freq: Every day | ORAL | 12 refills | Status: DC

## 2016-05-01 NOTE — Progress Notes (Signed)
Patient: Emma Rogers Female    DOB: [birth year]   50 y.o.   MRN: [id number] Visit Date: 05/01/2016  Today's Provider: Wilhemena Durie, MD   Chief Complaint  Patient presents with  . New Patient (Initial Visit)   Subjective:    HPI  Patient comes in today wanting to establish care with a new provider. Patient was prevoiusly a patient at [facility] with Dr. Sharlene Motts.    Headaches:  Patient reports that she has history of migraine headaches. She reports that she takes Maxalt as needed. She is also requesting a refill.   Fatigue:   Patient reports that she has felt more tired than usual. Patient is wanting to possibly have this evaluated today. Patient reports that she feels physically fatigued, and she does not have the motivation to work out like she used to. Patient states that she is physically tired. She used to walk 2-3 miles per day and now has trouble doing this. It is of note that the patient has intentionally lost 100 pounds. Recently she's gained about 15 pounds back. She snores a little bit but does not think she has apnea spells Patient had a pacemaker platelets 23 years ago for neurocardiogenic syncope. This has not been checked out in some time. Patient did have an upper respiratory infection 2 months ago. This is resolved.  Allergies  Allergen Reactions  . Amoxicillin Other (See Comments)  . Clavulanic Acid Other (See Comments)  . Morphine And Related     vomiting   Current Meds  Medication Sig  . furosemide (LASIX) 40 MG tablet Take 40 mg by mouth daily.  . rizatriptan (MAXALT) 10 MG tablet Take 10 mg by mouth as needed for migraine. May repeat in 2 hours if needed    Review of Systems  Constitutional: Positive for fatigue. Negative for activity change, appetite change, chills, diaphoresis, fever and unexpected weight change.  Eyes: Negative.   Respiratory: Negative.   Cardiovascular: Negative.   Gastrointestinal: Negative.   Endocrine: Negative.     Genitourinary: Negative.   Musculoskeletal: Negative.   Skin: Negative.   Allergic/Immunologic: Negative.   Neurological: Positive for headaches.  Hematological: Negative.   Psychiatric/Behavioral: Negative.     Social History  Substance Use Topics  . Smoking status: Never Smoker  . Smokeless tobacco: Never Used  . Alcohol use 0.0 oz/week     Comment: occ   Objective:   BP 104/72 (BP Location: Right Arm, Patient Position: Sitting, Cuff Size: Large)   Pulse 72   Temp 98.5 F (36.9 C)   Resp 16   Ht 5' 4.5" (1.638 m)   Wt 196 lb (88.9 kg)   BMI 33.12 kg/m   Physical Exam  Constitutional: She is oriented to person, place, and time. She appears well-developed and well-nourished.  HENT:  Head: Normocephalic and atraumatic.  Right Ear: External ear normal.  Nose: Nose normal.  Mouth/Throat: Oropharynx is clear and moist.  Eyes: Conjunctivae and EOM are normal. Pupils are equal, round, and reactive to light.  Neck:  Slight thyroid enlargement on the right.   Cardiovascular: Normal rate and normal heart sounds.   Pulmonary/Chest: Effort normal and breath sounds normal.  Abdominal: Soft. Bowel sounds are normal.  Neurological: She is alert and oriented to person, place, and time.  Skin: Skin is warm and dry.  Psychiatric: She has a normal mood and affect. Her behavior is normal. Judgment and thought content normal.  Assessment & Plan:     1. Other fatigue Check labs. Known etiology today. Could be sleep apnea. Could be cardiac in origin or related to the URI she had 2 months ago. Refer to cardiology at this time. Echocardiogram and interrogation of pacemaker's probably completely appropriate. Postviral cardiomyopathy certainly a possibility. - EKG 12-Lead - CBC with Differential/Platelet - Comprehensive metabolic panel - TSH - Sed Rate (ESR) - Hemoglobin A1c - POCT urinalysis dipstick   2. Pacemaker placement 23 years ago for neurocardiogenic syncope   3.  Status post partial thyroidectomy for goiter 4. Migraine headaches Maxalt refilled and Topamax started back at very low dose. I will see her back in a few weeks. I have done the exam and reviewed the above chart and it is accurate to the best of my knowledge.  Richard Cranford Mon, MD  [facility]

## 2016-05-02 LAB — CBC WITH DIFFERENTIAL/PLATELET
Basophils Absolute: 0 10*3/uL (ref 0.0–0.2)
Basos: 1 %
EOS (ABSOLUTE): 0.1 10*3/uL (ref 0.0–0.4)
EOS: 2 %
HEMATOCRIT: 45.4 % (ref 34.0–46.6)
HEMOGLOBIN: 15.1 g/dL (ref 11.1–15.9)
Immature Grans (Abs): 0 10*3/uL (ref 0.0–0.1)
Immature Granulocytes: 0 %
LYMPHS ABS: 1.8 10*3/uL (ref 0.7–3.1)
Lymphs: 25 %
MCH: 31.1 pg (ref 26.6–33.0)
MCHC: 33.3 g/dL (ref 31.5–35.7)
MCV: 94 fL (ref 79–97)
MONOCYTES: 5 %
Monocytes Absolute: 0.4 10*3/uL (ref 0.1–0.9)
NEUTROS ABS: 4.7 10*3/uL (ref 1.4–7.0)
Neutrophils: 67 %
Platelets: 310 10*3/uL (ref 150–379)
RBC: 4.85 x10E6/uL (ref 3.77–5.28)
RDW: 13.4 % (ref 12.3–15.4)
WBC: 7.1 10*3/uL (ref 3.4–10.8)

## 2016-05-02 LAB — COMPREHENSIVE METABOLIC PANEL
ALBUMIN: 4.8 g/dL (ref 3.5–5.5)
ALK PHOS: 56 IU/L (ref 39–117)
ALT: 18 IU/L (ref 0–32)
AST: 19 IU/L (ref 0–40)
Albumin/Globulin Ratio: 1.7 (ref 1.2–2.2)
BUN / CREAT RATIO: 23 (ref 9–23)
BUN: 25 mg/dL — ABNORMAL HIGH (ref 6–24)
Bilirubin Total: 0.7 mg/dL (ref 0.0–1.2)
CO2: 28 mmol/L (ref 18–29)
CREATININE: 1.11 mg/dL — AB (ref 0.57–1.00)
Calcium: 9.7 mg/dL (ref 8.7–10.2)
Chloride: 97 mmol/L (ref 96–106)
GFR calc non Af Amer: 59 mL/min/{1.73_m2} — ABNORMAL LOW (ref >59)
GFR, EST AFRICAN AMERICAN: 68 mL/min/{1.73_m2} (ref >59)
GLOBULIN, TOTAL: 2.8 g/dL (ref 1.5–4.5)
Glucose: 102 mg/dL — ABNORMAL HIGH (ref 65–99)
Potassium: 4.6 mmol/L (ref 3.5–5.2)
SODIUM: 140 mmol/L (ref 134–144)
Total Protein: 7.6 g/dL (ref 6.0–8.5)

## 2016-05-02 LAB — SEDIMENTATION RATE: Sed Rate: 6 mm/hr (ref 0–32)

## 2016-05-02 LAB — TSH: TSH: 1.27 u[IU]/mL (ref 0.450–4.500)

## 2016-05-02 LAB — HEMOGLOBIN A1C
Est. average glucose Bld gHb Est-mCnc: 120 mg/dL
Hgb A1c MFr Bld: 5.8 % — ABNORMAL HIGH (ref 4.8–5.6)

## 2016-05-06 LAB — POCT URINALYSIS DIPSTICK
Bilirubin, UA: NEGATIVE
Blood, UA: NEGATIVE
GLUCOSE UA: NEGATIVE
Ketones, UA: NEGATIVE
LEUKOCYTES UA: NEGATIVE
Nitrite, UA: NEGATIVE
PROTEIN UA: NEGATIVE
Spec Grav, UA: 1.02
UROBILINOGEN UA: 0.2
pH, UA: 6

## 2016-05-21 ENCOUNTER — Ambulatory Visit: Payer: 59 | Admitting: Family Medicine

## 2016-06-17 ENCOUNTER — Encounter: Payer: Self-pay | Admitting: Physician Assistant

## 2016-06-17 ENCOUNTER — Ambulatory Visit: Payer: Self-pay | Admitting: Physician Assistant

## 2016-06-17 VITALS — BP 120/80 | HR 60 | Temp 98.8°F

## 2016-06-17 DIAGNOSIS — J019 Acute sinusitis, unspecified: Secondary | ICD-10-CM

## 2016-06-17 MED ORDER — AZITHROMYCIN 250 MG PO TABS: ORAL_TABLET | ORAL | 0 refills | Status: DC

## 2016-06-17 NOTE — Progress Notes (Signed)
S/ 4 day hx of nasal congestion , blowing yellow HA and ST , fever 101  , achy , ear pressure , hx of uri 2 weeks ago , ? Seasonal allergies. Hx of allergy shots, takes zyrtec and nasonex  O/ VSS mildly ill  ENT nasal mucosa red ,swollen with purulent rhinorhea  + ethmoid ,max tenderness, neck supple, nontender heart brady, lungs clear  A/ acute rhinosinusitis P Supportive measures discussed. Follow up prn not improving  ' rx zpack , nasal saline .cont, zyrtec and nasonex

## 2016-07-16 ENCOUNTER — Encounter: Payer: Self-pay | Admitting: Physician Assistant

## 2016-07-16 ENCOUNTER — Ambulatory Visit: Payer: Self-pay | Admitting: Physician Assistant

## 2016-07-16 VITALS — BP 136/88 | HR 71 | Temp 98.3°F

## 2016-07-16 DIAGNOSIS — Z950 Presence of cardiac pacemaker: Secondary | ICD-10-CM

## 2016-07-16 DIAGNOSIS — R531 Weakness: Secondary | ICD-10-CM

## 2016-07-16 NOTE — Progress Notes (Signed)
S: c/o weakness and fatigue, states she was at a ballgame on Saturday, was cheering and having fun did not feel weak but felt a twinge in her chest, ?if pacemaker fired?, had a little dizziness, no actual cp/sob, no v/d, no diaphoresis, just really tired, pacemaker battery was replaced in 2011 and was told one of the leads was questionable at that time, has not been having it checked like she should  O: vitals wnl, nad, pt appears tired, lungs c t a, cv rrr, ekg wnl  A: weakness, pacemaker  P: f/u with cardiology for recheck of pacemaker, pcp for weakness and fatigue

## 2016-07-18 NOTE — Progress Notes (Signed)
[place] [facility] [place] With Dr. Elberta Fortisamnitz on Oct 31,2017 @ 11:45 a.m. Patient was advised

## 2016-07-21 NOTE — Progress Notes (Signed)
Electrophysiology Office Note   Date:  07/22/2016   ID:  Emma Rogers, DOB [birth year], MRN [id number]  PCP:  Megan Mansichard Gilbert Jr, MD Primary Electrophysiologist:  Francy Mcilvaine Jorja LoaMartin Tally Mattox, MD    Chief Complaint  Patient presents with  . New Patient (Initial Visit)    dizzy/palpitations     History of Present Illness: Emma Rogers is a 50 y.o. female who presents today for electrophysiology evaluation.   Presented to PCP with weakness and fatigue. Had twinge in her chest at a football game. She says that she has had fatigue and weakness since that time. This approximately a month ago. She did have her device interrogated which showed no evide at the time. She has had episodes of SVT that occurred around October 24 and 25th. She has been having chest pressure as well. She says that she has pressure at rest and not associated with exertion. She says that she has a squeezing sensation in her left arm.   Today, she denies symptoms of palpitations, chest pain, shortness of breath, orthopnea, PND, lower extremity edema, claudication, dizziness, presyncope, syncope, bleeding, or neurologic sequela. The patient is tolerating medications without difficulties and is otherwise without complaint today.    Past Medical History:  Diagnosis Date  . Allergic rhinitis   . Asthma   . Depression   . [id number](478.2Headache(784.0)    Past Surgical History:  Procedure Laterality Date  . ABDOMINAL HYSTERECTOMY  2009   still has 1 ovary  . INSERT / REPLACE / REMOVE PACEMAKER     Permanent pacemaker, Dr. Graciela HusbandsKlein  . Lobe hemithroidectomy     Right     Current Outpatient Prescriptions  Medication Sig Dispense Refill  . azithromycin (ZITHROMAX Z-PAK) 250 MG tablet As directed 6 each 0  . furosemide (LASIX) 40 MG tablet Take 1 tablet (40 mg total) by mouth daily. 30 tablet 12  . rizatriptan (MAXALT) 10 MG tablet Take 1 tablet (10 mg total) by mouth as needed for migraine. May repeat in 2 hours if needed 10 tablet 12    . topiramate (TOPAMAX) 25 MG tablet Take 1 tablet (25 mg total) by mouth at bedtime. 30 tablet 12   No current facility-administered medications for this visit.     Allergies:   Amoxicillin; Clavulanic acid; and Morphine and related   Social History:  The patient  reports that she has never smoked. She has never used smokeless tobacco. She reports that she drinks alcohol. She reports that she does not use drugs.   Family History:  The patient's family history includes Asthma in her other; Coronary artery disease in her other; Lung cancer in her other; Prostate cancer in her other.    ROS:  Please see the history of present illness.   Otherwise, review of systems is positive for chest pain, palpitations, fatigue, DOE, headaches.   All other systems are reviewed and negative.    PHYSICAL EXAM: VS:  BP 122/84   Pulse 72  , BMI There is no height or weight on file to calculate BMI. GEN: Well nourished, well developed, in no acute distress  HEENT: normal  Neck: no JVD, carotid bruits, or masses Cardiac: RRR; no murmurs, rubs, or gallops,no edema  Respiratory:  clear to auscultation bilaterally, normal work of breathing GI: soft, nontender, nondistended, + BS MS: no deformity or atrophy  Skin: warm and dry,  device pocket is well healed Neuro:  Strength and sensation are intact Psych: euthymic mood, full affect  EKG:  EKG is not ordered today. Personal review of the ekg ordered 05/01/16 shows sinus rhythm, anterior Q waves   Device interrogation is reviewed today in detail.  See PaceArt for details.   Recent Labs: 04/02/2016: Hemoglobin 14.4 05/01/2016: ALT 18; BUN 25; Creatinine, Ser 1.11; Platelets 310; Potassium 4.6; Sodium 140; TSH 1.270    Lipid Panel     Component Value Date/Time   CHOL 233 (H) 02/13/2012 1039   TRIG 64.0 02/13/2012 1039   HDL 50.30 02/13/2012 1039   CHOLHDL 5 02/13/2012 1039   VLDL 12.8 02/13/2012 1039   LDLDIRECT 178.3 02/13/2012 1039     Wt  Readings from Last 3 Encounters:  05/01/16 196 lb (88.9 kg)  04/02/16 185 lb (83.9 kg)  07/03/15 183 lb (83 kg)      Other studies Reviewed: Additional studies/ records that were reviewed today include: Epic notes   ASSESSMENT AND PLAN:  1.  Dual-chamber pacemaker: [place] in [place] years ago.She was having episodes of apparently a vagal mediated syncope. She has not had any further issues. Leads are without abnormality.We Cait Locust set her up for home monitoring.  2. Chest pain: appears atypical currently has it is not associated with exertion. I have told her that if the pain continues, worsens or occurs with exertion, to call the office back and we Ania Levay plan for stress testing at that time.  3. Palpitations:  Has had short episodes of palpitations that have lasted 20 minutes. Device interrogation shows no evidence of arrhythmia lasting that amount of time. She has had 40 seconds worth of arrhythmias that appeared to be some sort of SVT on her device. She also had, in 2015, episode of arrhythmia with a heart rate of greater than 400 and the atrium and 200 in the ventricle. This is possibly due to atrial fibrillation. Fortunately her risk of stroke is low with a chads 2 vas of 1.    Current medicines are reviewed at length with the patient today.   The patient does not have concerns regarding her medicines.  The following changes were made today:  none  Labs/ tests ordered today include:  No orders of the defined types were placed in this encounter.    Disposition:   FU with Dickie Labarre 1 years  Signed, Lamorris Knoblock Jorja LoaMartin Dartanyon Frankowski, MD  07/22/2016 12:10 PM     [facility] [address] [place] [address] [phone] (office) [phone] (fax)

## 2016-07-22 ENCOUNTER — Encounter: Payer: Self-pay | Admitting: Cardiology

## 2016-07-22 ENCOUNTER — Ambulatory Visit (INDEPENDENT_AMBULATORY_CARE_PROVIDER_SITE_OTHER): Payer: 59 | Admitting: Cardiology

## 2016-07-22 DIAGNOSIS — R55 Syncope and collapse: Secondary | ICD-10-CM | POA: Diagnosis not present

## 2016-07-22 LAB — CUP PACEART INCLINIC DEVICE CHECK
Battery Impedance: 178 Ohm
Brady Statistic AP VP Percent: 0 %
Brady Statistic AP VS Percent: 2 %
Brady Statistic AS VP Percent: 0 %
Brady Statistic AS VS Percent: 98 %
Date Time Interrogation Session: 20171031121748
Implantable Lead Implant Date: 19960806
Implantable Lead Location: 753859
Implantable Lead Location: 753860
Implantable Lead Model: 4524
Implantable Pulse Generator Implant Date: 20110328
Lead Channel Impedance Value: 1087 Ohm
Lead Channel Pacing Threshold Amplitude: 0.75 V
Lead Channel Pacing Threshold Amplitude: 1 V
Lead Channel Pacing Threshold Pulse Width: 0.4 ms
Lead Channel Sensing Intrinsic Amplitude: 4 mV
Lead Channel Setting Pacing Amplitude: 1.875
Lead Channel Setting Pacing Amplitude: 2.5 V
Lead Channel Setting Pacing Pulse Width: 0.4 ms
Lead Channel Setting Sensing Sensitivity: 2 mV
MDC IDC LEAD IMPLANT DT: 19960806
MDC IDC MSMT BATTERY REMAINING LONGEVITY: 143 mo
MDC IDC MSMT BATTERY VOLTAGE: 2.79 V
MDC IDC MSMT LEADCHNL RA IMPEDANCE VALUE: 301 Ohm
MDC IDC MSMT LEADCHNL RA PACING THRESHOLD AMPLITUDE: 0.875 V
MDC IDC MSMT LEADCHNL RA PACING THRESHOLD PULSEWIDTH: 0.4 ms
MDC IDC MSMT LEADCHNL RA PACING THRESHOLD PULSEWIDTH: 0.4 ms
MDC IDC MSMT LEADCHNL RA SENSING INTR AMPL: 2 mV
MDC IDC MSMT LEADCHNL RV PACING THRESHOLD AMPLITUDE: 1 V
MDC IDC MSMT LEADCHNL RV PACING THRESHOLD PULSEWIDTH: 0.4 ms

## 2016-07-22 NOTE — Patient Instructions (Signed)
Medication Instructions:    Your physician recommends that you continue on your current medications as directed. Please refer to the Current Medication list given to you today.  --- If you need a refill on your cardiac medications before your next appointment, please call your pharmacy. ---  Labwork:  None ordered  Testing/Procedures:  None ordered  Follow-Up: Remote monitoring is used to monitor your Pacemaker of ICD from home. This monitoring reduces the number of office visits required to check your device to one time per year. It allows us to keep an eye on the functioning of your device to ensure it is working properly. You are scheduled for a device check from home on 10/21/2016. You may send your transmission at any time that day. If you have a wireless device, the transmission will be sent automatically. After your physician reviews your transmission, you will receive a postcard with your next transmission date.   Your physician wants you to follow-up in: 1 year with Dr. Camnitz.  You will receive a reminder letter in the mail two months in advance. If you don't receive a letter, please call our office to schedule the follow-up appointment.  Thank you for choosing [facility]!!   Sherri Price, RN [phone]         

## 2016-08-28 ENCOUNTER — Ambulatory Visit: Payer: Self-pay | Admitting: Physician Assistant

## 2016-08-28 ENCOUNTER — Encounter: Payer: Self-pay | Admitting: Physician Assistant

## 2016-08-28 DIAGNOSIS — J209 Acute bronchitis, unspecified: Secondary | ICD-10-CM

## 2016-08-28 DIAGNOSIS — J208 Acute bronchitis due to other specified organisms: Secondary | ICD-10-CM

## 2016-08-28 MED ORDER — AZITHROMYCIN 250 MG PO TABS: ORAL_TABLET | ORAL | 0 refills | Status: DC

## 2016-08-28 MED ORDER — METHYLPREDNISOLONE 4 MG PO TBPK: ORAL_TABLET | ORAL | 0 refills | Status: DC

## 2016-08-28 MED ORDER — HYDROCOD POLST-CPM POLST ER 10-8 MG/5ML PO SUER: 5.0000 mL | Freq: Two times a day (BID) | ORAL | 0 refills | Status: DC | PRN

## 2016-08-28 NOTE — Progress Notes (Signed)
S: C/o cough and congestion with wheezing and chest pain, chest is sore from coughing, low grade fever, chills, cough is rattling and hacking but can't get mucus up; keeping pt awake at night;  denies cardiac type chest pain or sob, v/d, abd pain Remainder ros neg  O: vitals wnl, nad, tms clear, throat injected, neck supple no lymph, lungs with c t a, cv rrr, neuro intact  A:  Acute bronchitis   P:  rx medication: zpack, medrol dose pack, tussionex nr;  use otc meds, tylenol or motrin as needed for fever/chills, return if not better in 3 -5 days, return earlier if worsening, use rescue inhaler every 6 hours

## 2016-10-21 ENCOUNTER — Telehealth: Payer: Self-pay | Admitting: Cardiology

## 2016-10-21 ENCOUNTER — Encounter: Payer: 59 | Admitting: *Deleted

## 2016-10-21 NOTE — Telephone Encounter (Signed)
LMOVM reminding pt to send remote transmission.   

## 2016-10-23 ENCOUNTER — Encounter: Payer: Self-pay | Admitting: Cardiology

## 2016-10-28 ENCOUNTER — Ambulatory Visit (INDEPENDENT_AMBULATORY_CARE_PROVIDER_SITE_OTHER): Payer: 59 | Admitting: *Deleted

## 2016-10-28 ENCOUNTER — Ambulatory Visit: Payer: Self-pay | Admitting: Family Medicine

## 2016-10-28 DIAGNOSIS — R55 Syncope and collapse: Secondary | ICD-10-CM | POA: Diagnosis not present

## 2016-10-30 NOTE — Progress Notes (Signed)
Remote pacemaker transmission.   

## 2016-10-31 ENCOUNTER — Encounter: Payer: Self-pay | Admitting: Cardiology

## 2016-10-31 LAB — CUP PACEART REMOTE DEVICE CHECK
Battery Remaining Longevity: 134 mo
Brady Statistic AP VP Percent: 0 %
Brady Statistic AP VS Percent: 4 %
Brady Statistic AS VP Percent: 0 %
Brady Statistic AS VS Percent: 95 %
Implantable Lead Implant Date: 19960806
Implantable Lead Location: 753860
Implantable Lead Model: 5034
Implantable Pulse Generator Implant Date: 20110328
Lead Channel Impedance Value: 1138 Ohm
Lead Channel Impedance Value: 299 Ohm
Lead Channel Pacing Threshold Amplitude: 1.125 V
Lead Channel Pacing Threshold Amplitude: 1.125 V
Lead Channel Pacing Threshold Pulse Width: 0.4 ms
MDC IDC LEAD IMPLANT DT: 19960806
MDC IDC LEAD LOCATION: 753859
MDC IDC MSMT BATTERY IMPEDANCE: 225 Ohm
MDC IDC MSMT BATTERY VOLTAGE: 2.79 V
MDC IDC MSMT LEADCHNL RV PACING THRESHOLD PULSEWIDTH: 0.4 ms
MDC IDC SESS DTM: 20180207025905
MDC IDC SET LEADCHNL RA PACING AMPLITUDE: 2.25 V
MDC IDC SET LEADCHNL RV PACING AMPLITUDE: 2.5 V
MDC IDC SET LEADCHNL RV PACING PULSEWIDTH: 0.4 ms
MDC IDC SET LEADCHNL RV SENSING SENSITIVITY: 2 mV

## 2016-11-10 ENCOUNTER — Ambulatory Visit: Payer: Self-pay | Admitting: Physician Assistant

## 2016-11-10 ENCOUNTER — Encounter: Payer: Self-pay | Admitting: Physician Assistant

## 2016-11-10 VITALS — BP 134/90 | HR 64 | Temp 98.5°F

## 2016-11-10 DIAGNOSIS — W108XXA Fall (on) (from) other stairs and steps, initial encounter: Secondary | ICD-10-CM

## 2016-11-10 LAB — POCT URINALYSIS DIPSTICK
Bilirubin, UA: NEGATIVE
GLUCOSE UA: NEGATIVE
Ketones, UA: NEGATIVE
Leukocytes, UA: NEGATIVE
NITRITE UA: NEGATIVE
Protein, UA: NEGATIVE
RBC UA: NEGATIVE
Spec Grav, UA: 1.025
UROBILINOGEN UA: 0.2
pH, UA: 5.5

## 2016-11-10 MED ORDER — TRAMADOL HCL 50 MG PO TABS: 50.0000 mg | ORAL_TABLET | Freq: Three times a day (TID) | ORAL | 0 refills | Status: DC | PRN

## 2016-11-10 NOTE — Progress Notes (Signed)
S: c/o back and rib pain, was in socks and went to take recycling out, slipped on steps going down, landed on her butt and back and bounced down several more steps, no loc, did not hit head, no cp/sob, states her left side hurts, ribs to sternum, no v/d, no noted blood in urine  O: vitals wnl, nad, skin without bruising, left ribs are a little tender, left upper quad is a little tender, no splenomegaly noted, lungs c t a, cv rrr  A: contusion secondary to fall  P: urine dip in clinic, tramadol 50mg  #20 nr, return if increased abdominal pain

## 2016-11-24 ENCOUNTER — Ambulatory Visit
Admission: RE | Admit: 2016-11-24 | Discharge: 2016-11-24 | Disposition: A | Discharge status: Home / Self Care | Payer: 59 | Source: Ambulatory Visit | Attending: Physician Assistant | Admitting: Physician Assistant

## 2016-11-24 ENCOUNTER — Ambulatory Visit: Payer: Self-pay | Admitting: Physician Assistant

## 2016-11-24 ENCOUNTER — Encounter: Payer: Self-pay | Admitting: Physician Assistant

## 2016-11-24 VITALS — BP 110/82 | HR 63 | Temp 98.6°F

## 2016-11-24 DIAGNOSIS — S299XXA Unspecified injury of thorax, initial encounter: Secondary | ICD-10-CM | POA: Diagnosis not present

## 2016-11-24 DIAGNOSIS — M546 Pain in thoracic spine: Secondary | ICD-10-CM | POA: Diagnosis not present

## 2016-11-24 MED ORDER — KETOROLAC TROMETHAMINE 60 MG/2ML IM SOLN
60.0000 mg | Freq: Once | INTRAMUSCULAR | Status: AC
  Administered 2016-11-24: 60 mg via INTRAMUSCULAR

## 2016-11-24 NOTE — Progress Notes (Addendum)
   Subjective:thoracic pain    Patient ID: Emma RangerLorrie H Carter, female    DOB: 21-Nov-1965, 51 y.o.   MRN: [id number]  HPI Patient c/o left upper back pain for 2 weeks s/p fall. States intermitting pain with deep inspiration. States 2-3 episode of vertigo. 4 days ago.    Review of Systems Negative except for compliant.    Objective:   Physical Exam HEENT unremarkable. Neck supple. No chest wall deformity. Guarding with palpation left thoracic ara. Equal chest wall expansion.       Assessment & Plan:Thoracic pain  toradol IM and thoracic x-ray. Follow up post x-ray. No acute finding on Thoracic X-ray.

## 2016-12-17 ENCOUNTER — Telehealth: Payer: Self-pay | Admitting: Family Medicine

## 2016-12-17 NOTE — Telephone Encounter (Signed)
yes

## 2016-12-17 NOTE — Telephone Encounter (Signed)
Please schedule

## 2016-12-17 NOTE — Telephone Encounter (Signed)
This patient called saying she was a pt of Dr. Wonda OldsGilberts.  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  She had spoke with him about her husband becoming a pt of his.  She said he told her to call the office and ask.  Can we schd. An appt for her husband to become his patient.  Please advise.  [phone]  Thanks Barth Kirksteri

## 2016-12-17 NOTE — Telephone Encounter (Signed)
Spoke with pt and scheduled her husband for appt. Thanks TNP

## 2017-01-28 ENCOUNTER — Ambulatory Visit (INDEPENDENT_AMBULATORY_CARE_PROVIDER_SITE_OTHER): Payer: 59 | Admitting: *Deleted

## 2017-01-28 ENCOUNTER — Telehealth: Payer: Self-pay | Admitting: Cardiology

## 2017-01-28 DIAGNOSIS — R55 Syncope and collapse: Secondary | ICD-10-CM

## 2017-01-28 NOTE — Telephone Encounter (Signed)
LMOVM reminding pt to send remote transmission.   

## 2017-01-29 NOTE — Progress Notes (Signed)
Remote pacemaker transmission.   

## 2017-01-30 ENCOUNTER — Encounter: Payer: Self-pay | Admitting: Cardiology

## 2017-01-30 LAB — CUP PACEART REMOTE DEVICE CHECK
Battery Remaining Longevity: 134 mo
Brady Statistic AS VP Percent: 0 %
Implantable Lead Location: 753859
Implantable Lead Model: 5034
Implantable Pulse Generator Implant Date: 20110328
Lead Channel Pacing Threshold Amplitude: 1.125 V
Lead Channel Pacing Threshold Amplitude: 1.125 V
Lead Channel Pacing Threshold Pulse Width: 0.4 ms
Lead Channel Pacing Threshold Pulse Width: 0.4 ms
Lead Channel Setting Pacing Amplitude: 2.5 V
Lead Channel Setting Pacing Pulse Width: 0.4 ms
MDC IDC LEAD IMPLANT DT: 19960806
MDC IDC LEAD IMPLANT DT: 19960806
MDC IDC LEAD LOCATION: 753860
MDC IDC MSMT BATTERY IMPEDANCE: 225 Ohm
MDC IDC MSMT BATTERY VOLTAGE: 2.79 V
MDC IDC MSMT LEADCHNL RA IMPEDANCE VALUE: 306 Ohm
MDC IDC MSMT LEADCHNL RV IMPEDANCE VALUE: 1138 Ohm
MDC IDC MSMT LEADCHNL RV SENSING INTR AMPL: 5.6 mV
MDC IDC SESS DTM: 20180509200003
MDC IDC SET LEADCHNL RA PACING AMPLITUDE: 2.25 V
MDC IDC SET LEADCHNL RV SENSING SENSITIVITY: 2 mV
MDC IDC STAT BRADY AP VP PERCENT: 0 %
MDC IDC STAT BRADY AP VS PERCENT: 4 %
MDC IDC STAT BRADY AS VS PERCENT: 95 %

## 2017-02-20 ENCOUNTER — Encounter: Payer: Self-pay | Admitting: Family Medicine

## 2017-02-20 ENCOUNTER — Ambulatory Visit (INDEPENDENT_AMBULATORY_CARE_PROVIDER_SITE_OTHER): Payer: 59 | Admitting: Family Medicine

## 2017-02-20 VITALS — BP 108/76 | HR 74 | Temp 98.2°F | Resp 16 | Ht 65.0 in | Wt 221.5 lb

## 2017-02-20 DIAGNOSIS — F39 Unspecified mood [affective] disorder: Secondary | ICD-10-CM

## 2017-02-20 DIAGNOSIS — R5383 Other fatigue: Secondary | ICD-10-CM | POA: Insufficient documentation

## 2017-02-20 DIAGNOSIS — R4586 Emotional lability: Secondary | ICD-10-CM

## 2017-02-20 DIAGNOSIS — Z5181 Encounter for therapeutic drug level monitoring: Secondary | ICD-10-CM

## 2017-02-20 DIAGNOSIS — R002 Palpitations: Secondary | ICD-10-CM | POA: Diagnosis not present

## 2017-02-20 DIAGNOSIS — E669 Obesity, unspecified: Secondary | ICD-10-CM | POA: Insufficient documentation

## 2017-02-20 DIAGNOSIS — R34 Anuria and oliguria: Secondary | ICD-10-CM

## 2017-02-20 DIAGNOSIS — G43009 Migraine without aura, not intractable, without status migrainosus: Secondary | ICD-10-CM | POA: Diagnosis not present

## 2017-02-20 DIAGNOSIS — R7303 Prediabetes: Secondary | ICD-10-CM | POA: Diagnosis not present

## 2017-02-20 DIAGNOSIS — N289 Disorder of kidney and ureter, unspecified: Secondary | ICD-10-CM

## 2017-02-20 DIAGNOSIS — Z79899 Other long term (current) drug therapy: Secondary | ICD-10-CM | POA: Diagnosis not present

## 2017-02-20 MED ORDER — VENLAFAXINE HCL ER 37.5 MG PO CP24: 37.5000 mg | ORAL_CAPSULE | Freq: Every day | ORAL | 6 refills | Status: DC

## 2017-02-20 MED ORDER — TOPIRAMATE 25 MG PO TABS: ORAL_TABLET | ORAL | 0 refills | Status: DC

## 2017-02-20 NOTE — Assessment & Plan Note (Signed)
See AVS

## 2017-02-20 NOTE — Assessment & Plan Note (Signed)
Check labs 

## 2017-02-20 NOTE — Patient Instructions (Addendum)
Let's get labs today If you have not heard anything from my staff in a week about any orders/referrals/studies from today, please contact us here to follow-up ([phone]) [phone] Check out the information at [email or web address].org entitled "Nutrition for Weight Loss: What You Need to Know about Fad Diets" Try to lose between 1-2 pounds per week by taking in fewer calories and burning off more calories You can succeed by limiting portions, limiting foods dense in calories and fat, becoming more active, and drinking 8 glasses of water a day (64 ounces) Don't skip meals, especially breakfast, as skipping meals may alter your metabolism Do not use over-the-counter weight loss pills or gimmicks that claim rapid weight loss A healthy BMI (or body mass index) is between 18.5 and 24.9 You can calculate your ideal BMI at the NIH website JobE[employer].huhttp://www.nhlbi.nih.gov/health/educational/lose_wt/BMI/bmicalc.htm Start back on the Topamax (topiramate) Start the low dose Effexor (venlaxafine) Keep me posted through MyChart

## 2017-02-20 NOTE — Progress Notes (Signed)
BP 108/76   Pulse 74   Temp 98.2 F (36.8 C) (Oral)   Resp 16   Ht 5\' 5"  (1.651 m)   Wt 221 lb 8 oz (100.5 kg)   SpO2 97%   BMI 36.86 kg/m    Subjective:    Patient ID: Emma Rogers, female    DOB: April 30, 1966, 51 y.o.   MRN: [id number]  HPI: Emma Rogers is a 51 y.o. female  Chief Complaint  Patient presents with  . Establish Care  . Hot Flashes    may be going through menopause   HPI Patient is here to establish care She has been bothered with hot flashes She has been on lasix for several years; she is concerned about her kidney function; if she doesn't take the lasix, e.g. Forgot to take it yesterday; then did not urinate from 7:30 am until 6 pm; if she takes the pill, will go to the bathroom several times Had some labs done by Dr. Sullivan Lone, kidney function dropped last time Her ankles will swell, even with the lasix She has been extremely fatigued Read about kidney disease and has been concerned UOP has reduced Dr. Clent Ridges at [facility] started her on 20 mg of lasix and then it was increased to 40 mg daily Eats a lot of strawberries, not others; no K+ Cramps in the calves Grandfather (maternal) had kidney disease and heart disease No hx of vitamin deficiency Mood swings; more irritable at times Sleep is horrible Tries to use advil pm sometimes for sleep, only for five days at a time She had lost down to 175 pounds; now she's married and eating more Really tired Hemorrhaged after her hysterectomy, numbers came back to normal; 2009; hx of blood transfusion She has a permanent pacemaker; neurocardiogenic syncope; sees [place] cardiologist; having palpitations She has been taking topamax to help with migraine prevention; was struggling at 100 mg, felt kind of drugged; has not been taking any lately at all Hx of prediabetes for years Prior A1c was 5.8  Depression screen [facility] 2/9 02/20/2017  Decreased Interest 0  Down, Depressed, Hopeless 0  PHQ - 2 Score 0   Relevant  past medical, surgical, family and social history reviewed Past Medical History:  Diagnosis Date  . Allergic rhinitis   . Asthma   . Depression   . VHQIONGE(952.8)    Past Surgical History:  Procedure Laterality Date  . ABDOMINAL HYSTERECTOMY  2009   still has 1 ovary  . INSERT / REPLACE / REMOVE PACEMAKER     Permanent pacemaker, Dr. [name]  . Lobe hemithroidectomy     Right   Family History  Problem Relation Age of Onset  . Heart disease Father        pacemaker,arrythmias  . Asthma Other   . Coronary artery disease Other        Female 1st degree relative <50  . Lung cancer Other   . Prostate cancer Other        1st degree relative <50  . COPD Maternal Grandmother   . Heart disease Maternal Grandfather   . Liver disease Paternal Grandmother   . Heart disease Paternal Grandfather    Social History   Social History  . Marital status: Married    Spouse name: N/A  . Number of children: N/A  . Years of education: N/A   Occupational History  . Not on file.   Social History Main Topics  . Smoking status: Never Smoker  .  Smokeless tobacco: Never Used  . Alcohol use 0.0 oz/week     Comment: occ  . Drug use: No  . Sexual activity: Not on file   Other Topics Concern  . Not on file   Social History Narrative  . No narrative on file   Interim medical history since last visit reviewed. Allergies and medications reviewed  Review of Systems Per HPI unless specifically indicated above     Objective:    BP 108/76   Pulse 74   Temp 98.2 F (36.8 C) (Oral)   Resp 16   Ht 5\' 5"  (1.651 m)   Wt 221 lb 8 oz (100.5 kg)   SpO2 97%   BMI 36.86 kg/m   Wt Readings from Last 3 Encounters:  02/20/17 221 lb 8 oz (100.5 kg)  07/22/16 199 lb 3.2 oz (90.4 kg)  05/01/16 196 lb (88.9 kg)    Physical Exam  Constitutional: She appears well-developed and well-nourished. No distress.  HENT:  Head: Normocephalic and atraumatic.  Eyes: EOM are normal. No scleral icterus.    Neck: No thyromegaly present.  Cardiovascular: Normal rate, regular rhythm and normal heart sounds.   No murmur heard. Pulmonary/Chest: Effort normal and breath sounds normal. No respiratory distress. She has no wheezes.  Abdominal: Soft. Bowel sounds are normal. She exhibits no distension.  Musculoskeletal: Normal range of motion. She exhibits no edema.  Neurological: She is alert. She exhibits normal muscle tone.  Skin: Skin is warm and dry. She is not diaphoretic. No pallor.  Psychiatric: She has a normal mood and affect. Her behavior is normal. Judgment and thought content normal.      Assessment & Plan:   Problem List Items Addressed This Visit      Cardiovascular and Mediastinum   Migraine without aura and without status migrainosus, not intractable   Relevant Medications   topiramate (TOPAMAX) 25 MG tablet   venlafaxine XR (EFFEXOR XR) 37.5 MG 24 hr capsule     Other   Prediabetes    Check A1c; weight loss key      Relevant Orders   Hemoglobin A1c (Completed)   Obesity (BMI 35.0-39.9 without comorbidity)    See AVS      Fatigue    Check labs      Relevant Orders   TSH (Completed)   CBC with Differential/Platelet (Completed)   VITAMIN D 25 Hydroxy (Vit-D Deficiency, Fractures) (Completed)   B12 (Completed)    Other Visit Diagnoses    Renal insufficiency    -  Primary   noted on previous labs; will see what today's labs show; discussed reducing furosemide   Decreased urine output       check renal function; encouraged hydration   Relevant Orders   Urinalysis w microscopic + reflex cultur (Completed)   Palpitation       check K+ and Mg2+, especially with chronic diuretic therapy   Relevant Orders   Magnesium (Completed)   Medication monitoring encounter       Relevant Orders   CBC with Differential/Platelet (Completed)   COMPLETE METABOLIC PANEL WITH GFR (Completed)   Mood swings (HCC)       likely secondary to perimenopause; will start low-dose effexor    Encounter for monitoring diuretic therapy           Follow up plan: Return in about 6 months (around 08/22/2017) for twenty minute follow-up with fasting labs.  An after-visit summary was printed and given to the patient at check-out.  Please see the patient instructions which may contain other information and recommendations beyond what is mentioned above in the assessment and plan.  Meds ordered this encounter  Medications  . topiramate (TOPAMAX) 25 MG tablet    Sig: One by mouth daily x 1 week, then two a day    Dispense:  60 tablet    Refill:  0  . venlafaxine XR (EFFEXOR XR) 37.5 MG 24 hr capsule    Sig: Take 1 capsule (37.5 mg total) by mouth daily with breakfast.    Dispense:  30 capsule    Refill:  6    Orders Placed This Encounter  Procedures  . Urinalysis w microscopic + reflex cultur  . TSH  . CBC with Differential/Platelet  . COMPLETE METABOLIC PANEL WITH GFR  . Magnesium  . VITAMIN D 25 Hydroxy (Vit-D Deficiency, Fractures)  . B12  . Hemoglobin A1c

## 2017-02-20 NOTE — Assessment & Plan Note (Addendum)
Check A1c; weight loss key

## 2017-02-21 DIAGNOSIS — G43009 Migraine without aura, not intractable, without status migrainosus: Secondary | ICD-10-CM | POA: Insufficient documentation

## 2017-02-21 LAB — CBC WITH DIFFERENTIAL/PLATELET
Basophils Absolute: 0 cells/uL (ref 0–200)
Basophils Relative: 0 %
Eosinophils Absolute: 225 cells/uL (ref 15–500)
Eosinophils Relative: 3 %
HCT: 41.9 % (ref 35.0–45.0)
Hemoglobin: 13.9 g/dL (ref 11.7–15.5)
Lymphocytes Relative: 25 %
Lymphs Abs: 1875 cells/uL (ref 850–3900)
MCH: 31.4 pg (ref 27.0–33.0)
MCHC: 33.2 g/dL (ref 32.0–36.0)
MCV: 94.8 fL (ref 80.0–100.0)
MPV: 9.8 fL (ref 7.5–12.5)
Monocytes Absolute: 450 cells/uL (ref 200–950)
Monocytes Relative: 6 %
Neutro Abs: 4950 cells/uL (ref 1500–7800)
Neutrophils Relative %: 66 %
Platelets: 259 10*3/uL (ref 140–400)
RBC: 4.42 MIL/uL (ref 3.80–5.10)
RDW: 13.6 % (ref 11.0–15.0)
WBC: 7.5 10*3/uL (ref 3.8–10.8)

## 2017-02-21 LAB — COMPLETE METABOLIC PANEL WITH GFR
ALK PHOS: 52 U/L (ref 33–115)
ALT: 15 U/L (ref 6–29)
AST: 14 U/L (ref 10–35)
Albumin: 4.2 g/dL (ref 3.6–5.1)
BILIRUBIN TOTAL: 0.4 mg/dL (ref 0.2–1.2)
BUN: 24 mg/dL (ref 7–25)
CO2: 26 mmol/L (ref 20–31)
Calcium: 9.4 mg/dL (ref 8.6–10.2)
Chloride: 102 mmol/L (ref 98–110)
Creat: 0.95 mg/dL (ref 0.50–1.10)
GFR, EST NON AFRICAN AMERICAN: 71 mL/min (ref >=60)
GFR, Est African American: 81 mL/min (ref >=60)
Glucose, Bld: 112 mg/dL — ABNORMAL HIGH (ref 65–99)
Potassium: 4.1 mmol/L (ref 3.5–5.3)
SODIUM: 140 mmol/L (ref 135–146)
TOTAL PROTEIN: 6.8 g/dL (ref 6.1–8.1)

## 2017-02-21 LAB — URINALYSIS W MICROSCOPIC + REFLEX CULTURE
Bacteria, UA: NONE SEEN [HPF]
Bilirubin Urine: NEGATIVE
Casts: NONE SEEN [LPF]
Crystals: NONE SEEN [HPF]
Glucose, UA: NEGATIVE
Hgb urine dipstick: NEGATIVE
Ketones, ur: NEGATIVE
Leukocytes, UA: NEGATIVE
Nitrite: NEGATIVE
Protein, ur: NEGATIVE
RBC / HPF: NONE SEEN RBC/HPF (ref <=2)
Specific Gravity, Urine: 1.019 (ref 1.001–1.035)
Squamous Epithelial / HPF: NONE SEEN [HPF] (ref <=5)
WBC, UA: NONE SEEN WBC/HPF (ref <=5)
Yeast: NONE SEEN [HPF]
pH: 5 (ref 5.0–8.0)

## 2017-02-21 LAB — VITAMIN D 25 HYDROXY (VIT D DEFICIENCY, FRACTURES): Vit D, 25-Hydroxy: 25 ng/mL — ABNORMAL LOW (ref 30–100)

## 2017-02-21 LAB — MAGNESIUM: Magnesium: 2 mg/dL (ref 1.5–2.5)

## 2017-02-21 LAB — HEMOGLOBIN A1C
HEMOGLOBIN A1C: 5.6 % (ref <5.7)
Mean Plasma Glucose: 114 mg/dL

## 2017-02-21 LAB — TSH: TSH: 0.75 m[IU]/L

## 2017-02-21 LAB — VITAMIN B12: VITAMIN B 12: 201 pg/mL (ref 200–1100)

## 2017-02-23 ENCOUNTER — Encounter: Payer: Self-pay | Admitting: Family Medicine

## 2017-02-23 ENCOUNTER — Other Ambulatory Visit: Payer: Self-pay

## 2017-02-23 DIAGNOSIS — Z1239 Encounter for other screening for malignant neoplasm of breast: Secondary | ICD-10-CM

## 2017-02-24 ENCOUNTER — Encounter: Payer: Self-pay | Admitting: Family Medicine

## 2017-02-24 ENCOUNTER — Other Ambulatory Visit: Payer: Self-pay | Admitting: Family Medicine

## 2017-02-24 DIAGNOSIS — Z1231 Encounter for screening mammogram for malignant neoplasm of breast: Secondary | ICD-10-CM

## 2017-02-25 ENCOUNTER — Other Ambulatory Visit: Payer: Self-pay

## 2017-02-25 DIAGNOSIS — Z1239 Encounter for other screening for malignant neoplasm of breast: Secondary | ICD-10-CM

## 2017-02-26 ENCOUNTER — Ambulatory Visit
Admission: RE | Admit: 2017-02-26 | Discharge: 2017-02-26 | Disposition: A | Discharge status: Home / Self Care | Payer: 59 | Source: Ambulatory Visit | Attending: Family Medicine | Admitting: Family Medicine

## 2017-02-26 DIAGNOSIS — Z1231 Encounter for screening mammogram for malignant neoplasm of breast: Secondary | ICD-10-CM | POA: Insufficient documentation

## 2017-03-12 ENCOUNTER — Ambulatory Visit: Payer: Self-pay | Admitting: Physician Assistant

## 2017-03-12 ENCOUNTER — Encounter: Payer: Self-pay | Admitting: Physician Assistant

## 2017-03-12 VITALS — BP 100/80 | HR 68 | Temp 98.9°F

## 2017-03-12 DIAGNOSIS — J988 Other specified respiratory disorders: Principal | ICD-10-CM

## 2017-03-12 DIAGNOSIS — B9789 Other viral agents as the cause of diseases classified elsewhere: Secondary | ICD-10-CM

## 2017-03-12 MED ORDER — PSEUDOEPH-BROMPHEN-DM 30-2-10 MG/5ML PO SYRP: 5.0000 mL | ORAL_SOLUTION | Freq: Four times a day (QID) | ORAL | 0 refills | Status: DC | PRN

## 2017-03-12 MED ORDER — IBUPROFEN 800 MG PO TABS: 800.0000 mg | ORAL_TABLET | Freq: Three times a day (TID) | ORAL | 0 refills | Status: DC | PRN

## 2017-03-12 NOTE — Progress Notes (Signed)
   Subjective:URI    Patient ID: Emma RangerLorrie H Carter, female    DOB: 20-Jan-1966, 51 y.o.   MRN: [id number]  HPI Patient c/o 4 days of nasal congestion, fever, and non-productive cough. Denies N/V/D, ear pressure, or sore thraot. States Body ache. Mild reflief with OTC medications.   Review of Systems    Unremarkable except for compliant. Objective:   Physical Exam Mild guarding left maxillary sinus. Edematous nasal turbinates with clear rhinorrhea. Post nasal draining. Neck supple, Lungs CTA, and Heart RRR.       Assessment & Plan:URI  Bromfed DM and Ibuprofen as directed. Follow up PCP 3 days if no improvement.

## 2017-03-26 ENCOUNTER — Other Ambulatory Visit: Payer: Self-pay | Admitting: Family Medicine

## 2017-03-30 ENCOUNTER — Encounter: Payer: Self-pay | Admitting: Family Medicine

## 2017-03-30 NOTE — Telephone Encounter (Signed)
I called patient She is not thinking she needs to call 911 It was a little alarming at first; not sick, not clammy, not radiating, just under the breast, under the breast, in the sternum; not all the time; went and walked this morning at the park, more when she is outside; drinking water to keep from coughing; something not right with her breathing; controlled asthma No constant No hx of DVT or PE Did fall in her garden jacuzzi tub a week and a half ago, started after that; was getting in the tub and foot slipped, one leg out, one in, fell forward onto the pacemaker; big bruise on the leg I urged her to go to urgent care now; recommended she get EKG, CXR, D-dimer, discussed that some worrisome things could be responsible (angina, PE) and otherwise not so worrisome but we need to get her checked out; she agrees to go I will be happy to see her at 3:40 pm tomorrow if they tell her to f/u with her primary ------------------------------------ Cassandra or Efraim KaufmannMelissa or Bjorn LoserRhonda, whoever sees this first; please put patient down for the 3:40 pm appt Tuesday 03/31/17 to hold it, then contact her to see if she'll be coming in

## 2017-03-31 ENCOUNTER — Emergency Department
Admission: EM | Admit: 2017-03-31 | Discharge: 2017-03-31 | Disposition: A | Discharge status: Home / Self Care | Payer: 59 | Attending: Emergency Medicine | Admitting: Emergency Medicine

## 2017-03-31 ENCOUNTER — Ambulatory Visit
Admission: RE | Admit: 2017-03-31 | Discharge: 2017-03-31 | Disposition: A | Discharge status: Home / Self Care | Payer: 59 | Source: Ambulatory Visit | Attending: Family Medicine | Admitting: Family Medicine

## 2017-03-31 ENCOUNTER — Other Ambulatory Visit
Admission: RE | Admit: 2017-03-31 | Discharge: 2017-03-31 | Disposition: A | Discharge status: Home / Self Care | Payer: 59 | Source: Ambulatory Visit | Attending: Family Medicine | Admitting: Family Medicine

## 2017-03-31 ENCOUNTER — Encounter: Payer: Self-pay | Admitting: Emergency Medicine

## 2017-03-31 ENCOUNTER — Ambulatory Visit: Payer: Self-pay | Admitting: Physician Assistant

## 2017-03-31 ENCOUNTER — Ambulatory Visit (INDEPENDENT_AMBULATORY_CARE_PROVIDER_SITE_OTHER): Payer: 59 | Admitting: Family Medicine

## 2017-03-31 ENCOUNTER — Emergency Department: Payer: 59

## 2017-03-31 ENCOUNTER — Encounter: Payer: Self-pay | Admitting: Family Medicine

## 2017-03-31 ENCOUNTER — Other Ambulatory Visit: Payer: Self-pay

## 2017-03-31 VITALS — BP 118/76 | HR 99 | Temp 98.4°F | Resp 16 | Wt 216.7 lb

## 2017-03-31 DIAGNOSIS — R079 Chest pain, unspecified: Secondary | ICD-10-CM | POA: Diagnosis not present

## 2017-03-31 DIAGNOSIS — R0602 Shortness of breath: Secondary | ICD-10-CM

## 2017-03-31 DIAGNOSIS — R0789 Other chest pain: Secondary | ICD-10-CM

## 2017-03-31 DIAGNOSIS — J4541 Moderate persistent asthma with (acute) exacerbation: Secondary | ICD-10-CM

## 2017-03-31 DIAGNOSIS — Z79899 Other long term (current) drug therapy: Secondary | ICD-10-CM | POA: Diagnosis not present

## 2017-03-31 DIAGNOSIS — J45909 Unspecified asthma, uncomplicated: Secondary | ICD-10-CM | POA: Diagnosis not present

## 2017-03-31 DIAGNOSIS — R7989 Other specified abnormal findings of blood chemistry: Secondary | ICD-10-CM | POA: Diagnosis not present

## 2017-03-31 LAB — BASIC METABOLIC PANEL
ANION GAP: 8 (ref 5–15)
BUN: 24 mg/dL — AB (ref 6–20)
CHLORIDE: 105 mmol/L (ref 101–111)
CO2: 26 mmol/L (ref 22–32)
Calcium: 9.6 mg/dL (ref 8.9–10.3)
Creatinine, Ser: 1 mg/dL (ref 0.44–1.00)
GFR calc Af Amer: >60 mL/min (ref >60)
GFR calc non Af Amer: >60 mL/min (ref >60)
GLUCOSE: 135 mg/dL — AB (ref 65–99)
POTASSIUM: 3.5 mmol/L (ref 3.5–5.1)
Sodium: 139 mmol/L (ref 135–145)

## 2017-03-31 LAB — CBC WITH DIFFERENTIAL/PLATELET
BASOS ABS: 0.1 10*3/uL (ref 0–0.1)
Basophils Relative: 1 %
Eosinophils Absolute: 0.2 10*3/uL (ref 0–0.7)
Eosinophils Relative: 3 %
HCT: 40.6 % (ref 35.0–47.0)
HEMOGLOBIN: 14.3 g/dL (ref 12.0–16.0)
LYMPHS ABS: 2.2 10*3/uL (ref 1.0–3.6)
LYMPHS PCT: 26 %
MCH: 32 pg (ref 26.0–34.0)
MCHC: 35.2 g/dL (ref 32.0–36.0)
MCV: 90.8 fL (ref 80.0–100.0)
Monocytes Absolute: 0.4 10*3/uL (ref 0.2–0.9)
Monocytes Relative: 5 %
NEUTROS ABS: 5.4 10*3/uL (ref 1.4–6.5)
NEUTROS PCT: 65 %
Platelets: 255 10*3/uL (ref 150–440)
RBC: 4.47 MIL/uL (ref 3.80–5.20)
RDW: 13.5 % (ref 11.5–14.5)
WBC: 8.4 10*3/uL (ref 3.6–11.0)

## 2017-03-31 LAB — BRAIN NATRIURETIC PEPTIDE: B NATRIURETIC PEPTIDE 5: 24 pg/mL (ref 0.0–100.0)

## 2017-03-31 LAB — FIBRIN DERIVATIVES D-DIMER (ARMC ONLY): Fibrin derivatives D-dimer (ARMC): 586.47 — ABNORMAL HIGH (ref 0.00–499.00)

## 2017-03-31 LAB — TROPONIN I

## 2017-03-31 MED ORDER — ALBUTEROL SULFATE HFA 108 (90 BASE) MCG/ACT IN AERS: 2.0000 | INHALATION_SPRAY | Freq: Four times a day (QID) | RESPIRATORY_TRACT | 2 refills | Status: DC | PRN

## 2017-03-31 MED ORDER — METHYLPREDNISOLONE SODIUM SUCC 125 MG IJ SOLR
125.0000 mg | Freq: Once | INTRAMUSCULAR | Status: AC
  Administered 2017-03-31: 125 mg via INTRAVENOUS
  Filled 2017-03-31: qty 2

## 2017-03-31 MED ORDER — ALBUTEROL SULFATE (2.5 MG/3ML) 0.083% IN NEBU
5.0000 mg | INHALATION_SOLUTION | Freq: Once | RESPIRATORY_TRACT | Status: AC
  Administered 2017-03-31: 5 mg via RESPIRATORY_TRACT
  Filled 2017-03-31: qty 6

## 2017-03-31 MED ORDER — PREDNISONE 20 MG PO TABS: 60.0000 mg | ORAL_TABLET | Freq: Every day | ORAL | 0 refills | Status: DC

## 2017-03-31 MED ORDER — IOPAMIDOL (ISOVUE-370) INJECTION 76%
75.0000 mL | Freq: Once | INTRAVENOUS | Status: AC | PRN
  Administered 2017-03-31: 75 mL via INTRAVENOUS

## 2017-03-31 NOTE — ED Notes (Signed)
Patient refused wheel chair out. Patient verbalizes understanding of d/c instructions and follow-up. VS stable and pain controlled per patient.  Patient in NAD at time of d/c and denies further concerns regarding this visit. Patient stable at the time of departure from the unit, departing unit by the safest and most appropriate manner per that patients condition and limitations. Patient advised to return to the ED at any time for emergent concerns, or for new/worsening symptoms.  

## 2017-03-31 NOTE — Progress Notes (Signed)
BP 118/76   Pulse 99   Temp 98.4 F (36.9 C) (Oral)   Resp 16   Wt 216 lb 11.2 oz (98.3 kg)   SpO2 95%   BMI 36.06 kg/m    Subjective:    Patient ID: Emma Rogers, female    DOB: 1966-04-23, 51 y.o.   MRN: [id number]  HPI: Emma Rogers is a 51 y.o. female  Chief Complaint  Patient presents with  . Shortness of Breath    with chest pain    HPI See MyChart message from earlier; patient did NOT go to the ER or urgent care last night as advised [facility] is [facility] Chest pain in the mid-sternum and across under both breasts Started after cold and fall into the tub; 2 weeks duration She is having shortness of breath; worse with walking, going out in the morning Was hollering and clapping at a ball game and got really winded Has an on-demand pacemaker Had a cold a few weeks ago, went to urgent care, they said it was URI, no antibiotics No worsening leg edema Gave her allergy medicine and benadryl, then cough and then the cough got better, but still nagging cough; not at night Does not keep her up at night Sneezing spells; will go to sneezing 20 times Used to have asthma Even just talking, has to take a breath Something is wrong More of an ache; not constant; not crushing It was "concerning" the other day Began to panic a little, thought about going to the ER Hx of anemia; hemorrhaged after hysterectomy Feels swimmy-headed feeling if she misses the effexor  Depression screen [facility] 2/[facility] 03/31/2017 02/20/2017  Decreased Interest 0 0  Down, Depressed, Hopeless 0 0  PHQ - 2 Score 0 0    Relevant past medical, surgical, family and social history reviewed Past Medical History:  Diagnosis Date  . Allergic rhinitis   . Asthma   . Depression   . HYQMVHQI(696.2Headache(784.0)    Past Surgical History:  Procedure Laterality Date  . ABDOMINAL HYSTERECTOMY  2009   still has 1 ovary  . BREAST BIOPSY Left 09/30/2002   Ductogram-neg  . INSERT / REPLACE / REMOVE  PACEMAKER     Permanent pacemaker, Dr. Graciela HusbandsKlein  . Lobe hemithroidectomy     Right   Family History  Problem Relation Age of Onset  . Heart disease Father        pacemaker,arrythmias  . Asthma Other   . Coronary artery disease Other        Female 1st degree relative <50  . Lung cancer Other   . Prostate cancer Other        1st degree relative <50  . COPD Maternal Grandmother   . Breast cancer Maternal Grandmother   . Heart disease Maternal Grandfather   . Liver disease Paternal Grandmother   . Heart disease Paternal Grandfather    Social History   Social History  . Marital status: Married    Spouse name: N/A  . Number of children: N/A  . Years of education: N/A   Occupational History  . Not on file.   Social History Main Topics  . Smoking status: Never Smoker  . Smokeless tobacco: Never Used  . Alcohol use No  . Drug use: No  . Sexual activity: Yes   Other Topics Concern  . Not on file   Social History Narrative  . No narrative on file    Interim medical  history since last visit reviewed. Allergies and medications reviewed  Review of Systems Per HPI unless specifically indicated above     Objective:    BP 118/76   Pulse 99   Temp 98.4 F (36.9 C) (Oral)   Resp 16   Wt 216 lb 11.2 oz (98.3 kg)   SpO2 95%   BMI 36.06 kg/m   Wt Readings from Last 3 Encounters:  03/31/17 216 lb 11.2 oz (98.3 kg)  02/20/17 221 lb 8 oz (100.5 kg)  07/22/16 199 lb 3.2 oz (90.4 kg)    Physical Exam  Constitutional: She appears well-developed and well-nourished. No distress.  HENT:  Head: Normocephalic and atraumatic.  Eyes: EOM are normal. No scleral icterus.  Neck: No thyromegaly present.  Cardiovascular: Normal rate, regular rhythm and normal heart sounds.   No murmur heard. Pulmonary/Chest: Effort normal and breath sounds normal. No respiratory distress. She has no wheezes. She exhibits tenderness.  Some tenderness with palpation over the mid to lower sternum    Abdominal: Soft. Bowel sounds are normal. She exhibits no distension.  Musculoskeletal: Normal range of motion. She exhibits no edema.  Neurological: She is alert. She exhibits normal muscle tone.  Skin: Skin is warm and dry. She is not diaphoretic. No pallor.  Unable to look at nailbeds for pallor; no conjunctival pallor  Psychiatric: Her behavior is normal. Judgment and thought content normal. Her mood appears not anxious. She does not exhibit a depressed mood.   12 lead EKG done today: NSR; no pacer spikes (on-demand only) Inverted T waves in V1 and V2 unchanged from prior Poor R wave progression No ST-T wave elevation     Assessment & Plan:   Problem List Items Addressed This Visit      Other   Chest pain - Primary    Patient did not get evaluated last night as advised; she does not want to go to the ER now, after I explained ddx includes angina, PE, pericarditis, pneumonia, etc.; she still politely decline going to the ER and wishes instead for outpatient work-up; will get CXR and labs; to ER or call 911 if worse      Relevant Orders   EKG 12-Lead   DG Chest 2 View   Troponin I   D-Dimer, Quantitative   B Nat Peptide   C-reactive protein   CBC with Differential/Platelet   Basic Metabolic Panel (BMET)       Follow up plan: No Follow-up on file.  An after-visit summary was printed and given to the patient at check-out.  Please see the patient instructions which may contain other information and recommendations beyond what is mentioned above in the assessment and plan.  No orders of the defined types were placed in this encounter.   Orders Placed This Encounter  Procedures  . DG Chest 2 View  . Troponin I  . D-Dimer, Quantitative  . B Nat Peptide  . C-reactive protein  . CBC with Differential/Platelet  . Basic Metabolic Panel (BMET)  . EKG 12-Lead     Addendum: D-dimer is positive; patient advised to go right to the ER for evaluation; report given to [facility],  triage nurse

## 2017-03-31 NOTE — ED Provider Notes (Signed)
[facility] Emergency Department Provider Note  ____________________________________________   I have reviewed the triage vital signs and the nursing notes.   HISTORY  Chief Complaint Chest Pain    HPI Emma Rogers is a 51 y.o. female  who states she has a history of asthma as a child which she is mostly outgrown suffer occasionally, she had a URI about 2-3 weeks ago since that time she's been having what she believes to be asthmatic symptoms with wheeze, occasional cough, sneezing, and occasional pain in her chest especially with coughing. No exertional chest pain. She states that she feels that this is like asthma but she had work done by her primary care doctor including a prominent was negative but a d-dimer that was positive. She has no personal family history of PE or DVT, no leg swelling, no recent travel she is not on any estrogen and doesn't have any risk factors for PE that she knows of. She was sent in here because of positive d-dimer for further evaluation of her cough and wheeze symptoms.   Past Medical History:  Diagnosis Date  . Allergic rhinitis   . Asthma   . Depression   . ZOXWRUEA(540.9Headache(784.0)     Patient Active Problem List   Diagnosis Date Noted  . Chest pain 03/31/2017  . Migraine without aura and without status migrainosus, not intractable 02/21/2017  . Prediabetes 02/20/2017  . Fatigue 02/20/2017  . Obesity (BMI 35.0-39.9 without comorbidity) 02/20/2017  . 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 VASOVAGAL SYNCOPE 11/27/2009  . ALLERGIC RHINITIS 01/18/2008  . HEADACHE 01/18/2008    Past Surgical History:  Procedure Laterality Date  . ABDOMINAL HYSTERECTOMY  2009   still has 1 ovary  . BREAST BIOPSY Left 09/30/2002   Ductogram-neg  . INSERT / REPLACE / REMOVE PACEMAKER     Permanent pacemaker, Dr. Graciela HusbandsKlein  . Lobe hemithroidectomy     Right    Prior to Admission medications   Medication Sig Start Date End Date Taking? Authorizing Provider   furosemide (LASIX) 40 MG tablet Take 1 tablet (40 mg total) by mouth daily. 05/01/16  Yes Maple HudsonGilbert, Richard L Jr., MD  ibuprofen (ADVIL,MOTRIN) 800 MG tablet Take 1 tablet (800 mg total) by mouth every 8 (eight) hours as needed for moderate pain. 03/12/17  Yes Joni ReiningSmith, Ronald K, PA-C  Ibuprofen-Diphenhydramine Cit (ADVIL PM PO) Take 1 tablet by mouth at bedtime as needed.   Yes [provider]  rizatriptan (MAXALT) 10 MG tablet Take 1 tablet (10 mg total) by mouth as needed for migraine. May repeat in 2 hours if needed 05/01/16  Yes Maple HudsonGilbert, Richard L Jr., MD  topiramate (TOPAMAX) 25 MG tablet Take 1 tablet (25 mg total) by mouth 2 (two) times daily. Patient taking differently: Take 50 mg by mouth at bedtime.  03/26/17  Yes [name], Janit BernMelinda P, MD  venlafaxine XR (EFFEXOR XR) 37.5 MG 24 hr capsule Take 1 capsule (37.5 mg total) by mouth daily with breakfast. Patient not taking: Reported on 03/31/2017 02/20/17   Kerman PasseyLada, Melinda P, MD    Allergies Amoxicillin; Clavulanic acid; and Morphine and related  Family History  Problem Relation Age of Onset  . Heart disease Father        pacemaker,arrythmias  . Asthma Other   . Coronary artery disease Other        Female 1st degree relative <50  . Lung cancer Other   . Prostate cancer Other        1st degree  relative <50  . COPD Maternal Grandmother   . Breast cancer Maternal Grandmother   . Heart disease Maternal Grandfather   . Liver disease Paternal Grandmother   . Heart disease Paternal Grandfather     Social History Social History  Substance Use Topics  . Smoking status: Never Smoker  . Smokeless tobacco: Never Used  . Alcohol use No    Review of Systems Constitutional: No fever/chills Eyes: No visual changes. ENT: No sore throat. No stiff neck no neck pain Cardiovascular: Denies chest pain. Respiratory: See history of present illness regarding shortness of breath. Gastrointestinal:   no vomiting.  No diarrhea.  No  constipation. Genitourinary: Negative for dysuria. Musculoskeletal: Negative lower extremity swelling Skin: Negative for rash. Neurological: Negative for severe headaches, focal weakness or numbness.   ____________________________________________   PHYSICAL EXAM:  VITAL SIGNS: ED Triage Vitals  Enc Vitals Group     BP 03/31/17 1907 (!) 143/72     Pulse Rate 03/31/17 1907 89     Resp 03/31/17 1907 20     Temp 03/31/17 1907 98.3 F (36.8 C)     Temp Source 03/31/17 1907 Oral     SpO2 03/31/17 1907 99 %     Weight 03/31/17 1908 200 lb (90.7 kg)     Height 03/31/17 1908 5\' 6"  (1.676 m)     Head Circumference --      Peak Flow --      Pain Score 03/31/17 1907 1     Pain Loc --      Pain Edu? --      Excl. in GC? --     Constitutional: Alert and oriented. Well appearing and in no acute distress. Using her cell phone no acute distress Eyes: Conjunctivae are normal Head: Atraumatic HEENT: No congestion/rhinnorhea. Mucous membranes are moist.  Oropharynx non-erythematous Neck:   Nontender with no meningismus, no masses, no stridor Cardiovascular: Normal rate, regular rhythm. Grossly normal heart sounds.  Good peripheral circulation. Respiratory: Normal respiratory effort.  No retractions. Slightly coarse to auscultation no rales or rhonchi no wheeze Abdominal: Soft and nontender. No distention. No guarding no rebound Back:  There is no focal tenderness or step off.  there is no midline tenderness there are no lesions noted. there is no CVA tenderness  Musculoskeletal: No lower extremity tenderness, no upper extremity tenderness. No joint effusions, no DVT signs strong distal pulses no edema Neurologic:  Normal speech and language. No gross focal neurologic deficits are appreciated.  Skin:  Skin is warm, dry and intact. No rash noted. Psychiatric: Mood and affect are normal. Speech and behavior are normal.  ____________________________________________   LABS (all labs ordered  are listed, but only abnormal results are displayed)  Labs Reviewed  TROPONIN I   ____________________________________________  EKG  I personally interpreted any EKGs ordered by me or triage Normal sinus rhythm at 89 bpm no acute ST elevation or acute ST depression, but LAD, no acute ischemic changes. ____________________________________________  RADIOLOGY  I reviewed any imaging ordered by me or triage that were performed during my shift and, if possible, patient and/or family made aware of any abnormal findings. ____________________________________________   PROCEDURES  Procedure(s) performed: None  Procedures  Critical Care performed: None  ____________________________________________   INITIAL IMPRESSION / ASSESSMENT AND PLAN / ED COURSE  Pertinent labs & imaging results that were available during my care of the patient were reviewed by me and considered in my medical decision making (see chart for details).  Patient  with shortness of breath symptoms for 2 weeks after a URI, blood work reviewed from earlier this morning repeat troponin negative, CT scan shows some inflammatory changes but no acute pathology otherwise. At this time, there does not appear to be clinical evidence to support the diagnosis of pulmonary embolus, dissection, myocarditis, endocarditis, pericarditis, pericardial tamponade, acute coronary syndrome, pneumothorax, pneumonia, or any other acute intrathoracic pathology that will require admission or acute intervention. Nor is there evidence of any significant intra-abdominal pathology causing this symptoms. We will treat her for likely reactive airway disease which I think in more than account for her symptoms. She does have a cardiologist and we will have her follow up with them as is no evident can definitively rule out ACS although have low suspicion. Patient very comfortable with this plan breathing is subjectively improved after nebulizer and she is in  no acute distress    ____________________________________________   FINAL CLINICAL IMPRESSION(S) / ED DIAGNOSES  Final diagnoses:  Chest pain      This chart was dictated using voice recognition software.  Despite best efforts to proofread,  errors can occur which can change meaning.      Jeanmarie Plant, MD 03/31/17 2240

## 2017-03-31 NOTE — Assessment & Plan Note (Addendum)
Patient did not get evaluated last night as advised; she does not want to go to the ER now, after I explained ddx includes angina, PE, pericarditis, pneumonia, etc.; she still politely decline going to the ER and wishes instead for outpatient work-up; will get CXR and labs; to ER or call 911 if worse

## 2017-03-31 NOTE — ED Notes (Signed)
Patient ok to eat per EDP

## 2017-03-31 NOTE — ED Notes (Signed)
Per Dr.Robinson ,do not repeat labs, labs have resulted from earlier today

## 2017-03-31 NOTE — ED Notes (Signed)
Patient returned from CT

## 2017-03-31 NOTE — ED Notes (Signed)
Patient transported to CT 

## 2017-03-31 NOTE — Patient Instructions (Signed)
[facility]ray and stat labs Call 911 or go to the ER if worse

## 2017-03-31 NOTE — ED Triage Notes (Signed)
[facility]. Patient reports mild pressure to chest x1-1.5 weeks. +Shob with lots of talking, but no other symptoms otherwise.

## 2017-03-31 NOTE — Discharge Instructions (Signed)
Follow closely with her primary care doctor and her cardiologist return to the emergency room for any new or worrisome symptoms including chest pain or worsening shortness of breath.

## 2017-04-01 ENCOUNTER — Encounter: Payer: Self-pay | Admitting: Family Medicine

## 2017-04-01 ENCOUNTER — Telehealth: Payer: Self-pay

## 2017-04-01 ENCOUNTER — Ambulatory Visit (INDEPENDENT_AMBULATORY_CARE_PROVIDER_SITE_OTHER): Payer: 59 | Admitting: Family Medicine

## 2017-04-01 ENCOUNTER — Ambulatory Visit
Admission: RE | Admit: 2017-04-01 | Discharge: 2017-04-01 | Disposition: A | Discharge status: Home / Self Care | Payer: 59 | Source: Ambulatory Visit | Attending: Family Medicine | Admitting: Family Medicine

## 2017-04-01 VITALS — BP 122/68 | HR 88 | Temp 98.2°F | Resp 16 | Wt 215.7 lb

## 2017-04-01 DIAGNOSIS — R7989 Other specified abnormal findings of blood chemistry: Secondary | ICD-10-CM | POA: Diagnosis not present

## 2017-04-01 DIAGNOSIS — R079 Chest pain, unspecified: Secondary | ICD-10-CM | POA: Diagnosis not present

## 2017-04-01 DIAGNOSIS — Z950 Presence of cardiac pacemaker: Secondary | ICD-10-CM

## 2017-04-01 LAB — C-REACTIVE PROTEIN: CRP: 1.6 mg/dL — AB (ref <1.0)

## 2017-04-01 NOTE — Patient Instructions (Signed)
We'll get you to the cardiologist today We'll get the scans of the legs today Start aspirin, two baby aspirin daily until we know the results of your tests

## 2017-04-01 NOTE — Assessment & Plan Note (Signed)
Refer to cardiolgoist

## 2017-04-01 NOTE — Telephone Encounter (Signed)
Got a call from the [place] de[place] stating that her images are negative for bilateral DVT.   Patient did not stay and said she can be reached via cell.

## 2017-04-01 NOTE — Telephone Encounter (Signed)
Late Entry due to computer issues:  [facility]. Dr Sherie DonLada saw pt today but requested that cardiology review the pt's chart and determine if 04/09/17 Cardiology office visit is okay since pt went to the ER. Dr Excell Seltzerooper reviewed records and felt like this appointment time frame was appropriate. I made Asher MuirJamie aware of this information and she will contact the pt to advise that she keep currently scheduled appointment.

## 2017-04-01 NOTE — Telephone Encounter (Signed)
So I have [facility] [place] and because she has never been seen at that location they will not be able to see her because she would be considered new patient.  I contacted [facility] and she has an appt next Thursday, I stated ER doctor wanted her to be seen as soon as possible.  I spoke with nurse and she will get with Doctor and review your notes and ER and decide if she will need to be seen sooner, they will contact patient and also notify us of what the doctor chooses to do.

## 2017-04-01 NOTE — Progress Notes (Signed)
BP 122/68   Pulse 88   Temp 98.2 F (36.8 C) (Oral)   Resp 16   Wt 215 lb 11.2 oz (97.8 kg)   SpO2 96%   BMI 34.81 kg/m    Subjective:    Patient ID: Emma Rogers, female    DOB: 1966/04/07, 51 y.o.   MRN: [id number]  HPI: Emma Rogers is a 51 y.o. female  Chief Complaint  Patient presents with  . Follow-up    ER abnormal labs    HPI Patient had a positive D-dimer yesterday and went to the ER They did a chest CT The ER doctor wants him to see a cardiologist ASAP (today if possible) I asked about risk of DVT She hit the left medial leg in garden tub 1.5 to 2 weeks ago, bruised it; no redness or swelling otherwise  She had the chest CT done, and we reviewed that together here today:  CLINICAL DATA:  Mild chest pressure for 1-1/2 weeks, shortness of breath. Elevated D-dimer.  EXAM: CT ANGIOGRAPHY CHEST WITH CONTRAST  TECHNIQUE: Multidetector CT imaging of the chest was performed using the standard protocol during bolus administration of intravenous contrast. Multiplanar CT image reconstructions and MIPs were obtained to evaluate the vascular anatomy.  CONTRAST:  75 cc Isovue 370  COMPARISON:  CT chest September 27, 2008 and chest radiograph March 31, 2017 at 1723 hours  FINDINGS: CARDIOVASCULAR: Suboptimal contrast opacification of the pulmonary artery's (220 Hounsfield units, target is 250 Hounsfield units or greater). Main pulmonary artery is not enlarged. No pulmonary arterial filling defects to the level of the subsegmental branches. Heart size is normal, no right heart strain. No pericardial effusion. Thoracic aorta is normal course and caliber, unremarkable. Streak artifact from LEFT cardiac pacemaker.  MEDIASTINUM/NODES: No lymphadenopathy by CT size criteria.  LUNGS/PLEURA: Tracheobronchial tree is patent, no pneumothorax. Mild bronchial wall thickening and, heterogeneous lung attenuation without pleural effusion or focal consolidation.  Bibasilar bandlike atelectasis.  UPPER ABDOMEN: Included view of the abdomen is unremarkable.  MUSCULOSKELETAL: Visualized soft tissues and included osseous structures appear normal.  Review of the MIP images confirms the above findings.  IMPRESSION: No acute pulmonary embolism.  Mild bronchial wall thickening and heterogeneous lung attenuation compatible with small airway disease. Bibasilar atelectasis.   Electronically Signed   By: Awilda Metroourtnay  Bloomer M.D.   On: 03/31/2017 21:34  We reviewed her labs together as well today Potassium 3.5 (nl), normal BNP, normal CBC, trop I <0.03, CRP 1.6 Positive D-dimer on 03/31/17  Ref Range & Units 03/31/17  Fibrin derivatives D-dimer [facility] 0.00 - 499.00 586.47      Dep[facility] screen [facility] 2/[facility] 03/31/2017 02/20/2017  Decreased Interest 0 0  Down, Depressed, Hopeless 0 0  PHQ - 2 Score 0 0    Relevant past medical, surgical, family and social history reviewed Past Medical History:  Diagnosis Date  . Allergic rhinitis   . Asthma   . Depression   . WUJWJXBJ(478.2Headache(784.0)    Past Surgical History:  Procedure Laterality Date  . ABDOMINAL HYSTERECTOMY  2009   still has 1 ovary  . BREAST BIOPSY Left 09/30/2002   Ductogram-neg  . INSERT / REPLACE / REMOVE PACEMAKER     Permanent pacemaker, Dr. Graciela HusbandsKlein  . Lobe hemithroidectomy     Right   Family History  Problem Relation Age of Onset  . Heart disease Father        pacemaker,arrythmias  . Asthma Other   . Coronary artery disease Other  Female 1st degree relative <50  . Lung cancer Other   . Prostate cancer Other        1st degree relative <50  . COPD Maternal Grandmother   . Breast cancer Maternal Grandmother   . Heart disease Maternal Grandfather   . Liver disease Paternal Grandmother   . Heart disease Paternal Grandfather    Social History   Social History  . Marital status: Married    Spouse name: N/A  . Number of children: N/A  . Years of education: N/A    Occupational History  . Not on file.   Social History Main Topics  . Smoking status: Never Smoker  . Smokeless tobacco: Never Used  . Alcohol use No  . Drug use: No  . Sexual activity: Yes   Other Topics Concern  . Not on file   Social History Narrative  . No narrative on file    Interim medical history since last visit reviewed. Allergies and medications reviewed  Review of Systems Per HPI unless specifically indicated above     Objective:    BP 122/68   Pulse 88   Temp 98.2 F (36.8 C) (Oral)   Resp 16   Wt 215 lb 11.2 oz (97.8 kg)   SpO2 96%   BMI 34.81 kg/m     Physical Exam  Constitutional: She appears well-developed and well-nourished. No distress.  HENT:  Head: Normocephalic and atraumatic.  Eyes: EOM are normal. No scleral icterus.  Neck: No thyromegaly present.  Cardiovascular: Normal rate, regular rhythm and normal heart sounds.   No murmur heard. Pulmonary/Chest: Effort normal and breath sounds normal. No respiratory distress. She has no wheezes.  Abdominal: She exhibits no distension.  Musculoskeletal: Normal range of motion. She exhibits no edema.  Neurological: She is alert.  Skin: Skin is warm and dry. She is not diaphoretic. No pallor.  Unable to look at nailbeds for pallor; no conjunctival pallor  Psychiatric: Her behavior is normal. Judgment and thought content normal. Her mood appears not anxious. She does not exhibit a depressed mood.   Results for orders placed or performed during the hospital encounter of 03/31/17  Troponin I  Result Value Ref Range   Troponin I <0.03 <0.03 ng/mL      Assessment & Plan:   Problem List Items Addressed This Visit      Other   PPM-Medtronic    Refer to cardiolgoist      Relevant Orders   Ambulatory referral to Cardiology   US Venous Img Lower Bilateral (Completed)   Chest pain - Primary    Refer to cardiologist; ddx still includes small PE, start 162 mg aspirin; bronchial wall thickening  and on prednisone      Relevant Orders   Ambulatory referral to Cardiology   US Venous Img Lower Bilateral (Completed)    Other Visit Diagnoses    D-dimer, elevated       Relevant Orders   US Venous Img Lower Bilateral (Completed)      Follow up plan: No Follow-up on file.  An after-visit summary was printed and given to the patient at check-out.  Please see the patient instructions which may contain other information and recommendations beyond what is mentioned above in the assessment and plan.  No orders of the defined types were placed in this encounter.   Orders Placed This Encounter  Procedures  . US Venous Img Lower Bilateral  . Ambulatory referral to Cardiology

## 2017-04-01 NOTE — Assessment & Plan Note (Signed)
Refer to cardiologist; ddx still includes small PE, start 162 mg aspirin; bronchial wall thickening and on prednisone

## 2017-04-01 NOTE — Telephone Encounter (Signed)
[facility] called states Dr. Excell Seltzerooper has reviewed notes and feels that patient can wait to appt next Thursday.

## 2017-04-09 ENCOUNTER — Ambulatory Visit (INDEPENDENT_AMBULATORY_CARE_PROVIDER_SITE_OTHER): Payer: 59 | Admitting: Cardiology

## 2017-04-09 ENCOUNTER — Encounter: Payer: Self-pay | Admitting: Cardiology

## 2017-04-09 VITALS — BP 120/80 | HR 78 | Ht 66.0 in | Wt 217.2 lb

## 2017-04-09 DIAGNOSIS — R55 Syncope and collapse: Secondary | ICD-10-CM

## 2017-04-09 DIAGNOSIS — R079 Chest pain, unspecified: Secondary | ICD-10-CM | POA: Diagnosis not present

## 2017-04-09 DIAGNOSIS — R002 Palpitations: Secondary | ICD-10-CM

## 2017-04-09 DIAGNOSIS — Z950 Presence of cardiac pacemaker: Secondary | ICD-10-CM

## 2017-04-09 NOTE — Patient Instructions (Signed)
Medication Instructions:  Your physician recommends that you continue on your current medications as directed. Please refer to the Current Medication list given to you today.  If you need a refill on your cardiac medications before your next appointment, please call your pharmacy.   Labwork: None ordered  Testing/Procedures: None ordered  Follow-Up: Your physician wants you to follow-up in: 1 year with Dr. Camnitz.  You will receive a reminder letter in the mail two months in advance. If you don't receive a letter, please call our office to schedule the follow-up appointment.  Thank you for choosing [facility]!!   Sherri Price, RN [phone]  Any Other Special Instructions Will Be Listed Below (If Applicable).        

## 2017-04-09 NOTE — Progress Notes (Signed)
Electrophysiology Office Note   Date:  04/09/2017   ID:  Emma Rogers, DOB Feb 20, 1966, MRN [id number]  PCP:  Kerman PasseyLada, Melinda P, MD Primary Electrophysiologist:  Regan LemmingWill Martin Julaine Zimny, MD    Chief Complaint  Patient presents with  . Pacemaker Check    Syncope     History of Present Illness: Emma Rogers is a 51 y.o. female who presents today for electrophysiology evaluation.   She is feeling well today without major complaint. That being said, she does have some chest pain. The pain occurs both at rest and with exertion. There is no exacerbating or alleviating factor. It is associated with mild shortness of breath. She feels that the chest pain is likely due to asthma. Her pain has not changed since last being seen. Her palpitations also continue. There is no exacerbating or alleviating factor.   Today, denies symptoms of palpitations, chest pain, shortness of breath, orthopnea, PND, lower extremity edema, claudication, dizziness, presyncope, syncope, bleeding, or neurologic sequela. The patient is tolerating medications without difficulties and is otherwise without complaint today.    Past Medical History:  Diagnosis Date  . Allergic rhinitis   . Asthma   . Depression   . JXBJYNWG(956.2Headache(784.0)    Past Surgical History:  Procedure Laterality Date  . ABDOMINAL HYSTERECTOMY  2009   still has 1 ovary  . BREAST BIOPSY Left 09/30/2002   Ductogram-neg  . INSERT / REPLACE / REMOVE PACEMAKER     Permanent pacemaker, Dr. Graciela HusbandsKlein  . Lobe hemithroidectomy     Right     Current Outpatient Prescriptions  Medication Sig Dispense Refill  . albuterol (PROVENTIL HFA;VENTOLIN HFA) 108 (90 Base) MCG/ACT inhaler Inhale 2 puffs into the lungs every 6 (six) hours as needed for wheezing or shortness of breath. 1 Inhaler 2  . furosemide (LASIX) 40 MG tablet Take 1 tablet (40 mg total) by mouth daily. 30 tablet 12  . ibuprofen (ADVIL,MOTRIN) 800 MG tablet Take 1 tablet (800 mg total) by mouth every  8 (eight) hours as needed for moderate pain. 15 tablet 0  . Ibuprofen-Diphenhydramine Cit (ADVIL PM PO) Take 1 tablet by mouth at bedtime as needed.    . rizatriptan (MAXALT) 10 MG tablet Take 1 tablet (10 mg total) by mouth as needed for migraine. May repeat in 2 hours if needed 10 tablet 12  . topiramate (TOPAMAX) 25 MG tablet Take 1 tablet (25 mg total) by mouth 2 (two) times daily. (Patient taking differently: Take 50 mg by mouth at bedtime. ) 60 tablet 5   No current facility-administered medications for this visit.     Allergies:   Amoxicillin; Clavulanic acid; and Morphine and related   Social History:  The patient  reports that she has never smoked. She has never used smokeless tobacco. She reports that she does not drink alcohol or use drugs.   Family History:  The patient's family history includes Asthma in her other; Breast cancer in her maternal grandmother; COPD in her maternal grandmother; Coronary artery disease in her other; Heart disease in her father, maternal grandfather, and paternal grandfather; Liver disease in her paternal grandmother; Lung cancer in her other; Prostate cancer in her other.    ROS:  Please see the history of present illness.   Otherwise, review of systems is positive for Fatigue, chest pain, leg swelling, palpitations, cough, dyspnea on exertion, dizziness.   All other systems are reviewed and negative.   PHYSICAL EXAM: VS:  BP 120/80   Pulse  78   Ht 5\' 6"  (1.676 m)   Wt 217 lb 3.2 oz (98.5 kg)   SpO2 98%   BMI 35.06 kg/m  , BMI Body mass index is 35.06 kg/m. GEN: Well nourished, well developed, in no acute distress  HEENT: normal  Neck: no JVD, carotid bruits, or masses Cardiac: RRR; no murmurs, rubs, or gallops,no edema  Respiratory:  clear to auscultation bilaterally, normal work of breathing GI: soft, nontender, nondistended, + BS MS: no deformity or atrophy  Skin: warm and dry, device site well healed Neuro:  Strength and sensation are  intact Psych: euthymic mood, full affect  EKG:  EKG is not ordered today. Personal review of the ekg ordered 04/01/17 shows sinus rhythm, rate 89  Personal review of the device interrogation today. Results in [facility]  Recent Labs: 02/20/2017: ALT 15; Magnesium 2.0; TSH 0.75 03/31/2017: B Natriuretic Peptide 24.0; BUN 24; Creatinine, Ser 1.00; Hemoglobin 14.3; Platelets 255; Potassium 3.5; Sodium 139    Lipid Panel     Component Value Date/Time   CHOL 233 (H) 02/13/2012 1039   TRIG 64.0 02/13/2012 1039   HDL 50.30 02/13/2012 1039   CHOLHDL 5 02/13/2012 1039   VLDL 12.8 02/13/2012 1039   LDLDIRECT 178.3 02/13/2012 1039     Wt Readings from Last 3 Encounters:  04/09/17 217 lb 3.2 oz (98.5 kg)  04/01/17 215 lb 11.2 oz (97.8 kg)  03/31/17 200 lb (90.7 kg)      Other studies Reviewed: Additional studies/ records that were reviewed today include: Epic notes   ASSESSMENT AND PLAN:  1.  Dual-chamber pacemaker: Place in [place] with leads from 1996. Device functions without major abnormality. She does have some episodes of what appears to be in SVT possibly sinus tachycardia. No changes at this time.  2. Chest pain: Pain is atypical. She feels that her pain is due to her asthma. I told her that if asthma therapy does not improve her pain, that we Sandra Brents plan for stress testing  3. Palpitations:  Appears to have a one-to-one tachycardia that is likely due to sinus tachycardia. She does not wish to have therapy at this time. If tachycardia continues, or subsequent diagnosis is made, Syndey Jaskolski plan for either ablation or medical management.   Current medicines are reviewed at length with the patient today.   The patient does not have concerns regarding her medicines.  The following changes were made today:  None  Labs/ tests ordered today include:  No orders of the defined types were placed in this encounter.    Disposition:   FU with Breaunna Gottlieb 1 years  Signed, Xzander Gilham Jorja Loa, MD  04/09/2017 2:58 PM     [facility] [address] [place] [address] [phone] (office) [phone] (fax)

## 2017-04-14 LAB — CUP PACEART INCLINIC DEVICE CHECK
Battery Impedance: 225 Ohm
Battery Remaining Longevity: 135 mo
Brady Statistic AP VS Percent: 5 %
Brady Statistic AS VS Percent: 95 %
Implantable Lead Implant Date: 19960806
Implantable Lead Location: 753860
Implantable Lead Model: 4524
Lead Channel Impedance Value: 1152 Ohm
Lead Channel Pacing Threshold Amplitude: 1.25 V
Lead Channel Pacing Threshold Pulse Width: 0.4 ms
Lead Channel Sensing Intrinsic Amplitude: 4 mV
Lead Channel Setting Pacing Amplitude: 1.875
Lead Channel Setting Sensing Sensitivity: 2 mV
MDC IDC LEAD IMPLANT DT: 19960806
MDC IDC LEAD LOCATION: 753859
MDC IDC MSMT BATTERY VOLTAGE: 2.79 V
MDC IDC MSMT LEADCHNL RA IMPEDANCE VALUE: 317 Ohm
MDC IDC MSMT LEADCHNL RA PACING THRESHOLD AMPLITUDE: 0.75 V
MDC IDC MSMT LEADCHNL RA PACING THRESHOLD PULSEWIDTH: 0.4 ms
MDC IDC MSMT LEADCHNL RA SENSING INTR AMPL: 2 mV
MDC IDC PG IMPLANT DT: 20110328
MDC IDC SESS DTM: 20180719174542
MDC IDC SET LEADCHNL RV PACING AMPLITUDE: 2.5 V
MDC IDC SET LEADCHNL RV PACING PULSEWIDTH: 0.4 ms
MDC IDC STAT BRADY AP VP PERCENT: 0 %
MDC IDC STAT BRADY AS VP PERCENT: 0 %

## 2017-04-29 ENCOUNTER — Telehealth: Payer: Self-pay | Admitting: Cardiology

## 2017-04-29 ENCOUNTER — Encounter: Payer: 59 | Admitting: *Deleted

## 2017-04-29 NOTE — Telephone Encounter (Signed)
LMOVM reminding pt to send remote transmission.   

## 2017-04-30 ENCOUNTER — Encounter: Payer: Self-pay | Admitting: Cardiology

## 2017-05-01 ENCOUNTER — Ambulatory Visit: Payer: Self-pay | Admitting: Physician Assistant

## 2017-05-05 DIAGNOSIS — H52221 Regular astigmatism, right eye: Secondary | ICD-10-CM | POA: Diagnosis not present

## 2017-05-05 DIAGNOSIS — H5203 Hypermetropia, bilateral: Secondary | ICD-10-CM | POA: Diagnosis not present

## 2017-05-05 DIAGNOSIS — H524 Presbyopia: Secondary | ICD-10-CM | POA: Diagnosis not present

## 2017-05-12 ENCOUNTER — Encounter: Payer: Self-pay | Admitting: Family Medicine

## 2017-05-18 ENCOUNTER — Encounter: Payer: Self-pay | Admitting: Family Medicine

## 2017-05-18 DIAGNOSIS — R0602 Shortness of breath: Secondary | ICD-10-CM

## 2017-05-18 DIAGNOSIS — J309 Allergic rhinitis, unspecified: Secondary | ICD-10-CM

## 2017-05-18 MED ORDER — MONTELUKAST SODIUM 10 MG PO TABS: 10.0000 mg | ORAL_TABLET | Freq: Every day | ORAL | 3 refills | Status: DC

## 2017-06-04 DIAGNOSIS — H1045 Other chronic allergic conjunctivitis: Secondary | ICD-10-CM | POA: Diagnosis not present

## 2017-06-04 DIAGNOSIS — R05 Cough: Secondary | ICD-10-CM | POA: Diagnosis not present

## 2017-06-04 DIAGNOSIS — J300 Vasomotor rhinitis: Secondary | ICD-10-CM | POA: Diagnosis not present

## 2017-06-04 DIAGNOSIS — J309 Allergic rhinitis, unspecified: Secondary | ICD-10-CM | POA: Diagnosis not present

## 2017-06-30 ENCOUNTER — Other Ambulatory Visit: Payer: Self-pay

## 2017-06-30 DIAGNOSIS — G43009 Migraine without aura, not intractable, without status migrainosus: Secondary | ICD-10-CM

## 2017-06-30 MED ORDER — RIZATRIPTAN BENZOATE 10 MG PO TABS: 10.0000 mg | ORAL_TABLET | ORAL | 12 refills | Status: DC | PRN

## 2017-08-04 ENCOUNTER — Ambulatory Visit (INDEPENDENT_AMBULATORY_CARE_PROVIDER_SITE_OTHER): Payer: 59 | Admitting: Family Medicine

## 2017-08-04 ENCOUNTER — Encounter: Payer: Self-pay | Admitting: Family Medicine

## 2017-08-04 VITALS — BP 116/64 | HR 74 | Temp 98.1°F | Resp 14 | Wt 223.2 lb

## 2017-08-04 DIAGNOSIS — Z1211 Encounter for screening for malignant neoplasm of colon: Secondary | ICD-10-CM | POA: Diagnosis not present

## 2017-08-04 DIAGNOSIS — N951 Menopausal and female climacteric states: Secondary | ICD-10-CM | POA: Diagnosis not present

## 2017-08-04 DIAGNOSIS — E669 Obesity, unspecified: Secondary | ICD-10-CM | POA: Diagnosis not present

## 2017-08-04 DIAGNOSIS — G4700 Insomnia, unspecified: Secondary | ICD-10-CM | POA: Diagnosis not present

## 2017-08-04 MED ORDER — BUPROPION HCL ER (XL) 150 MG PO TB24: 150.0000 mg | ORAL_TABLET | Freq: Every day | ORAL | 0 refills | Status: DC

## 2017-08-04 NOTE — Patient Instructions (Addendum)
Start the Wellbutrin (bupropion), take in the morning No caffeine or chocolate or tea for 6-10 hours before bed No iPads or iPhones in the bedroom Check out the information at familydoctor.org entitled "Nutrition for Weight Loss: What You Need to Know about Fad Diets" Try to lose between 1-2 pounds per week by taking in fewer calories and burning off more calories You can succeed by limiting portions, limiting foods dense in calories and fat, becoming more active, and drinking 8 glasses of water a day (64 ounces) Don't skip meals, especially breakfast, as skipping meals may alter your metabolism Do not use over-the-counter weight loss pills or gimmicks that claim rapid weight loss A healthy BMI (or body mass index) is between 18.5 and 24.9 You can calculate your ideal BMI at the NIH website JobE[employer]ics.huhttp://www.nhlbi.nih.gov/health/educational/lose_wt/BMI/bmicalc.htm Consider the employee assistance counseling

## 2017-08-04 NOTE — Progress Notes (Signed)
BP 116/64 (BP Location: Right Arm)   Pulse 74   Temp 98.1 F (36.7 C) (Oral)   Resp 14   Wt 223 lb 3.2 oz (101.2 kg)   SpO2 98%   BMI 36.03 kg/m    Subjective:    Patient ID: Emma Rogers, female    DOB: 01-03-1966, 51 y.o.   MRN: [id number]  HPI: Emma Rogers is a 51 y.o. female  Chief Complaint  Patient presents with  . Menopause  . Depression    HPI Patient is here for f/u She is going through menopause; she was on the medicine for hot flashes, but it made her feel sleepy and dopey; she had to take the topamax, so she quit the other; the hot flashes are not nearly as bad as the mood and sleep; not sleeping well at night; using advil PM; mood swings; having trouble concentrating, noticed it at work; her mood has seemed switched this last week; she thinks it's associated with menopause and can handle and conquer the world, then the next thing is she is frustrated; that might last a week or two, like she is cycling; she still has one ovary; can't stand herself; weight gain; still having night sweats; she is not interested in HRT  She had a colonoscopy around 10-12 years ago; blood in stool; had some polyps, nothing cancerous; had sigmoidoscopy when she was maybe 51 years old; Dr. Thana AtesMorrisey did the procedure and she has a spastic colon, and when they put the air in there, it was a nightmare; she was asleep for the 2nd one; big ordeal for her; very traumatic thing for her  She actually lost 100 pounds 4 years ago and has slowly gained it back; she knows how to eat and knows what she needs to do  Depression screen [facility] 2/9 08/04/2017 03/31/2017 02/20/2017  Decreased Interest 1 0 0  Down, Depressed, Hopeless 1 0 0  PHQ - 2 Score 2 0 0  Altered sleeping 1 - -  Tired, decreased energy 1 - -  Change in appetite 0 - -  Feeling bad or failure about yourself  1 - -  Trouble concentrating 1 - -  Moving slowly or fidgety/restless 0 - -  Suicidal thoughts 0 - -  PHQ-9 Score 6 - -    Relevant past medical, surgical, family and social history reviewed Past Medical History:  Diagnosis Date  . Allergic rhinitis   . Asthma   . Depression   . SAYTKZSW(109.3Headache(784.0)    Past Surgical History:  Procedure Laterality Date  . ABDOMINAL HYSTERECTOMY  2009   still has 1 ovary  . BREAST BIOPSY Left 09/30/2002   Ductogram-neg  . INSERT / REPLACE / REMOVE PACEMAKER     Permanent pacemaker, Dr. Graciela HusbandsKlein  . Lobe hemithroidectomy     Right   Family History  Problem Relation Age of Onset  . Heart disease Father        pacemaker,arrythmias  . Asthma Other   . Coronary artery disease Other        Female 1st degree relative <50  . Lung cancer Other   . Prostate cancer Other        1st degree relative <50  . COPD Maternal Grandmother   . Breast cancer Maternal Grandmother   . Heart disease Maternal Grandfather   . Liver disease Paternal Grandmother   . Heart disease Paternal Grandfather    Social History   Tobacco Use  . Smoking  status: Never Smoker  . Smokeless tobacco: Never Used  Substance Use Topics  . Alcohol use: No    Alcohol/week: 0.0 oz  . Drug use: No   Interim medical history since last visit reviewed. Allergies and medications reviewed  Review of Systems Per HPI unless specifically indicated above     Objective:    BP 116/64 (BP Location: Right Arm)   Pulse 74   Temp 98.1 F (36.7 C) (Oral)   Resp 14   Wt 223 lb 3.2 oz (101.2 kg)   SpO2 98%   BMI 36.03 kg/m   Wt Readings from Last 3 Encounters:  08/04/17 223 lb 3.2 oz (101.2 kg)  04/09/17 217 lb 3.2 oz (98.5 kg)  04/01/17 215 lb 11.2 oz (97.8 kg)    Physical Exam  Constitutional: She appears well-developed and well-nourished.  Obese, weight gain noted  HENT:  Mouth/Throat: Mucous membranes are normal.  Eyes: EOM are normal. No scleral icterus.  Cardiovascular: Normal rate and regular rhythm.  Pulmonary/Chest: Effort normal and breath sounds normal.  Psychiatric: She has a normal mood and  affect. Her behavior is normal. Her mood appears not anxious. Her affect is not blunt. Her speech is not delayed. She is not slowed. She does not exhibit a depressed mood. She expresses no homicidal and no suicidal ideation.  Good eye contact with examiner      Assessment & Plan:   Problem List Items Addressed This Visit    None    Visit Diagnoses    Encounter for screening colonoscopy    -  Primary   Relevant Orders   Ambulatory referral to Gastroenterology       Follow up plan: Return in about 4 weeks (around 09/01/2017).  An after-visit summary was printed and given to the patient at check-out.  Please see the patient instructions which may contain other information and recommendations beyond what is mentioned above in the assessment and plan.  Meds ordered this encounter  Medications  . levocetirizine (XYZAL) 5 MG tablet    Sig: Take 5 mg daily by mouth.    Refill:  3  . BREO ELLIPTA 200-25 MCG/INH AEPB    Sig: Inhale 1 puff every morning into the lungs.    Refill:  3  . azelastine (OPTIVAR) 0.05 % ophthalmic solution    Sig: Place 1 drop every morning into both eyes.    Refill:  3  . buPROPion (WELLBUTRIN XL) 150 MG 24 hr tablet    Sig: Take 1 tablet (150 mg total) daily by mouth.    Dispense:  30 tablet    Refill:  0  . topiramate (TOPAMAX) 25 MG tablet    Sig: Take three pills (75 mg) by mouth at night for one week, then four pills (100 mg)    Dispense:  60 tablet    Refill:  5    Orders Placed This Encounter  Procedures  . Ambulatory referral to Gastroenterology

## 2017-08-11 ENCOUNTER — Other Ambulatory Visit: Payer: Self-pay | Admitting: Family Medicine

## 2017-08-11 DIAGNOSIS — G4700 Insomnia, unspecified: Secondary | ICD-10-CM | POA: Insufficient documentation

## 2017-08-11 DIAGNOSIS — N951 Menopausal and female climacteric states: Secondary | ICD-10-CM | POA: Insufficient documentation

## 2017-08-11 MED ORDER — TOPIRAMATE 100 MG PO TABS: 100.0000 mg | ORAL_TABLET | Freq: Every day | ORAL | 5 refills | Status: DC

## 2017-08-11 NOTE — Progress Notes (Signed)
Changing to 100 mg pills of topamax

## 2017-08-11 NOTE — Assessment & Plan Note (Signed)
Discussion with patient about healthy sleep hygiene; no electronics in the bedroom; no methylxanthines for 6-10 hours before bed

## 2017-08-11 NOTE — Assessment & Plan Note (Signed)
Discussed options for treatment

## 2017-08-11 NOTE — Assessment & Plan Note (Signed)
Discussion with patient about weight gain, what's needed for weight loss; encouragement given

## 2017-08-19 ENCOUNTER — Telehealth: Payer: Self-pay | Admitting: Family Medicine

## 2017-08-19 ENCOUNTER — Telehealth: Payer: Self-pay

## 2017-08-19 DIAGNOSIS — R5383 Other fatigue: Secondary | ICD-10-CM

## 2017-08-19 NOTE — Telephone Encounter (Signed)
I reviewed her chart, the last note from Dr. Elberta Fortisamnitz and the establish care note with Dr. Sullivan LoneGilbert I don't see an indication for Lasix (furosemide) I don't see an echocardiogram showing any evidence of heart failure I don't have a reason to prescribe this, so I'm going to suggest that she contact her cardiologist if it's for a heart issue If she has lymphedema or just fluid in the legs, we don't treat that with furosemide; we treat that with compression stockings Thank you

## 2017-08-19 NOTE — Telephone Encounter (Signed)
Copied from CRM #[id number]. Topic: Inquiry >> Aug 19, 2017  1:03 PM Alexander BergeronBarksdale, Emma B wrote: Reason for CRM: pt is needing her rx of  furosemide filled, the pharmacy has tried contacting the practice several times but hasnt heard anything back, contact pt or pharmacy if needed

## 2017-08-20 NOTE — Telephone Encounter (Signed)
Patient notified

## 2017-08-20 NOTE — Telephone Encounter (Signed)
Copied from CRM [phone]#[id number]. Topic: Quick Communication - See Telephone Encounter >> Aug 20, 2017  1:17 PM Rudi CocoLathan, Sheritha Louis M, [place] wrote: CRM for notification. See Telephone encounter for:   08/20/17. Pt. Called and she is returning call from Dr. Marlise EvesLada's nurse Asher MuirJamie please give pt. A call back when available

## 2017-08-20 NOTE — Telephone Encounter (Signed)
Left voice message to call back

## 2017-09-02 ENCOUNTER — Ambulatory Visit: Payer: 59 | Admitting: Family Medicine

## 2017-09-03 ENCOUNTER — Ambulatory Visit (INDEPENDENT_AMBULATORY_CARE_PROVIDER_SITE_OTHER): Payer: 59 | Admitting: Family Medicine

## 2017-09-03 ENCOUNTER — Encounter: Payer: Self-pay | Admitting: Family Medicine

## 2017-09-03 DIAGNOSIS — Z5181 Encounter for therapeutic drug level monitoring: Secondary | ICD-10-CM

## 2017-09-03 DIAGNOSIS — N182 Chronic kidney disease, stage 2 (mild): Secondary | ICD-10-CM | POA: Diagnosis not present

## 2017-09-03 DIAGNOSIS — E559 Vitamin D deficiency, unspecified: Secondary | ICD-10-CM

## 2017-09-03 DIAGNOSIS — F32A Depression, unspecified: Secondary | ICD-10-CM

## 2017-09-03 DIAGNOSIS — R600 Localized edema: Secondary | ICD-10-CM

## 2017-09-03 DIAGNOSIS — E669 Obesity, unspecified: Secondary | ICD-10-CM

## 2017-09-03 DIAGNOSIS — R55 Syncope and collapse: Secondary | ICD-10-CM

## 2017-09-03 DIAGNOSIS — R7303 Prediabetes: Secondary | ICD-10-CM | POA: Diagnosis not present

## 2017-09-03 DIAGNOSIS — N183 Chronic kidney disease, stage 3 unspecified: Secondary | ICD-10-CM | POA: Insufficient documentation

## 2017-09-03 DIAGNOSIS — E538 Deficiency of other specified B group vitamins: Secondary | ICD-10-CM | POA: Diagnosis not present

## 2017-09-03 DIAGNOSIS — F329 Major depressive disorder, single episode, unspecified: Secondary | ICD-10-CM | POA: Diagnosis not present

## 2017-09-03 MED ORDER — BUPROPION HCL ER (XL) 300 MG PO TB24: 300.0000 mg | ORAL_TABLET | Freq: Every day | ORAL | 3 refills | Status: DC

## 2017-09-03 MED ORDER — FUROSEMIDE 20 MG PO TABS: 20.0000 mg | ORAL_TABLET | ORAL | 5 refills | Status: DC

## 2017-09-03 NOTE — Assessment & Plan Note (Signed)
Check liver and kidney function 

## 2017-09-03 NOTE — Assessment & Plan Note (Signed)
Check A1c and glucose in 3-4 weeks

## 2017-09-03 NOTE — Assessment & Plan Note (Signed)
Managed by cardiologist; has pacemaker

## 2017-09-03 NOTE — Assessment & Plan Note (Signed)
Will limit furosemide; recheck Cr and GFR in 3-4 weeks

## 2017-09-03 NOTE — Assessment & Plan Note (Signed)
Start back on supplementation; see AVS

## 2017-09-03 NOTE — Assessment & Plan Note (Signed)
Start back on supplementation; should help with energy

## 2017-09-03 NOTE — Assessment & Plan Note (Signed)
Mild, in conjunction with vit D and vit B12 deficiency; increase wellbutrin; weight gain has influenced her mood

## 2017-09-03 NOTE — Progress Notes (Signed)
BP 116/68 (BP Location: Right Arm, Patient Position: Sitting, Cuff Size: Large)   Pulse 74   Temp 98.3 F (36.8 C) (Oral)   Ht 5\' 6"  (1.676 m)   Wt 221 lb 3.2 oz (100.3 kg)   SpO2 99%   BMI 35.70 kg/m    Subjective:    Patient ID: Emma Rogers, female    DOB: May 28, 1966, 51 y.o.   MRN: [id number]  HPI: Emma Rogers is a 51 y.o. female  Chief Complaint  Patient presents with  . Medication Management    Pt states she is here for 4wk f/u on wellbutrin, still notes some irritabilty     HPI  She is here for follow-up Some decreased appetite; some nausea, but that's not a bad thing, only if she overeats; knows she should just stop where she is at; this will help limit seconds she thinks Still a little bit irritable She can tell that it has helped Still frustrated; thinks maybe more  She has lasix; she has a pacemaker; they had originally put her on lasix to just make sure  She has been on it for so long, but started in conjunction with the pacemaker Dr. Clent RidgesFry had upped it from 20 mg to 40 mg, he was the primary at [facility] years ago The cardiologist is the one who originally put her on it It just stuck and just kept getting it refilled Had an echo years ago, and no one ever said that she had heart failure We reviewed the Cr and GFR over the last few years; GFR had gotten down to 59 at one point with Cr of 1.11 She has not contacted her cardiologist She started doing it every other day  She has prediabetes; would like to exercise more, work on her weight  Chart review showed vitamin D deficiency and B12 deficiency; she isn't really taking any vitamins; does have fatigue  Depression screen [facility] 2/9 09/03/2017 08/04/2017 03/31/2017 02/20/2017  Decreased Interest 0 1 0 0  Down, Depressed, Hopeless 0 1 0 0  PHQ - 2 Score 0 2 0 0  Altered sleeping - 1 - -  Tired, decreased energy - 1 - -  Change in appetite - 0 - -  Feeling bad or failure about yourself  - 1 - -  Trouble  concentrating - 1 - -  Moving slowly or fidgety/restless - 0 - -  Suicidal thoughts - 0 - -  PHQ-9 Score - 6 - -   Relevant past medical, surgical, family and social history reviewed Past Medical History:  Diagnosis Date  . Allergic rhinitis   . Asthma   . Depression   . QIONGEXB(284.1Headache(784.0)    Past Surgical History:  Procedure Laterality Date  . ABDOMINAL HYSTERECTOMY  2009   still has 1 ovary  . BREAST BIOPSY Left 09/30/2002   Ductogram-neg  . INSERT / REPLACE / REMOVE PACEMAKER     Permanent pacemaker, Dr. Graciela HusbandsKlein  . Lobe hemithroidectomy     Right   Family History  Problem Relation Age of Onset  . Heart disease Father        pacemaker,arrythmias  . Asthma Other   . Coronary artery disease Other        Female 1st degree relative <50  . Lung cancer Other   . Prostate cancer Other        1st degree relative <50  . COPD Maternal Grandmother   . Breast cancer Maternal Grandmother   .  Heart disease Maternal Grandfather   . Liver disease Paternal Grandmother   . Heart disease Paternal Grandfather    Social History   Tobacco Use  . Smoking status: Never Smoker  . Smokeless tobacco: Never Used  Substance Use Topics  . Alcohol use: No    Alcohol/week: 0.0 oz  . Drug use: No    Interim medical history since last visit reviewed. Allergies and medications reviewed  Review of Systems Per HPI unless specifically indicated above     Objective:    BP 116/68 (BP Location: Right Arm, Patient Position: Sitting, Cuff Size: Large)   Pulse 74   Temp 98.3 F (36.8 C) (Oral)   Ht 5\' 6"  (1.676 m)   Wt 221 lb 3.2 oz (100.3 kg)   SpO2 99%   BMI 35.70 kg/m   Wt Readings from Last 3 Encounters:  09/03/17 221 lb 3.2 oz (100.3 kg)  08/04/17 223 lb 3.2 oz (101.2 kg)  04/09/17 217 lb 3.2 oz (98.5 kg)    Physical Exam  Constitutional: She appears well-developed and well-nourished. No distress.  Eyes: EOM are normal. No scleral icterus.  Neck: No thyromegaly present.    Cardiovascular: Normal rate.  Pulmonary/Chest: Effort normal.  Abdominal: She exhibits no distension.  Musculoskeletal: She exhibits no edema.  Skin: No pallor.  Psychiatric: She has a normal mood and affect. Her behavior is normal. Judgment and thought content normal.      Assessment & Plan:   Problem List Items Addressed This Visit      Cardiovascular and Mediastinum   VASOVAGAL SYNCOPE    Managed by cardiologist; has pacemaker      Relevant Medications   furosemide (LASIX) 20 MG tablet (Start on 09/04/2017)     Genitourinary   CKD (chronic kidney disease) stage 2, GFR 60-89 ml/min    Will limit furosemide; recheck Cr and GFR in 3-4 weeks        Other   Vitamin D deficiency    Start back on supplementation; see AVS      Vitamin B12 deficiency    Start back on supplementation; should help with energy      Prediabetes    Check A1c and glucose in 3-4 weeks      Relevant Orders   Hemoglobin A1c   Obesity (BMI 35.0-39.9 without comorbidity)    She is working on weight loss; we talked about time for exercise      Relevant Orders   Lipid panel   Medication monitoring encounter    Check liver and kidney function      Relevant Orders   COMPLETE METABOLIC PANEL WITH GFR   Depression    Mild, in conjunction with vit D and vit B12 deficiency; increase wellbutrin; weight gain has influenced her mood      Relevant Medications   buPROPion (WELLBUTRIN XL) 300 MG 24 hr tablet   Bilateral leg edema    No known CHF; will decrease dose and go to three times a week to see how this works; check renal function in about 3-4 weeks          Follow up plan: No Follow-up on file.  An after-visit summary was printed and given to the patient at check-out.  Please see the patient instructions which may contain other information and recommendations beyond what is mentioned above in the assessment and plan.  Meds ordered this encounter  Medications  . buPROPion (WELLBUTRIN  XL) 300 MG 24 hr tablet  Sig: Take 1 tablet (300 mg total) by mouth daily.    Dispense:  90 tablet    Refill:  3  . furosemide (LASIX) 20 MG tablet    Sig: Take 1 tablet (20 mg total) by mouth 3 (three) times a week.    Dispense:  10 tablet    Refill:  5    Orders Placed This Encounter  Procedures  . COMPLETE METABOLIC PANEL WITH GFR  . Hemoglobin A1c  . Lipid panel

## 2017-09-03 NOTE — Assessment & Plan Note (Signed)
She is working on weight loss; we talked about time for exercise

## 2017-09-03 NOTE — Patient Instructions (Signed)
Return just for fasting labs in 3-4 weeks Let me know how you're doing on the higher dose Consider counseling Carve out some time just for you Start back on the vitamin B12, sublingual or regular but the sublingual will get right in Take 2,000 iu of vitamin D3 daily through the winter, then 1,000 iu come spring

## 2017-09-03 NOTE — Assessment & Plan Note (Signed)
No known CHF; will decrease dose and go to three times a week to see how this works; check renal function in about 3-4 weeks

## 2017-09-25 ENCOUNTER — Other Ambulatory Visit: Payer: Self-pay

## 2017-09-25 ENCOUNTER — Telehealth: Payer: Self-pay | Admitting: *Deleted

## 2017-09-25 DIAGNOSIS — Z1211 Encounter for screening for malignant neoplasm of colon: Secondary | ICD-10-CM

## 2017-09-25 MED ORDER — NA SULFATE-K SULFATE-MG SULF 17.5-3.13-1.6 GM/177ML PO SOLN: 1.0000 | Freq: Once | ORAL | 0 refills | Status: AC

## 2017-09-25 NOTE — Telephone Encounter (Signed)
   Primary Cardiologist:Will Jorja LoaMartin Camnitz, MD  Chart reviewed as part of pre-operative protocol coverage. Left voice mail to call back.   TroyBhavinkumar Travon Crochet, [place]  09/25/2017, 4:37 PM

## 2017-09-25 NOTE — Addendum Note (Signed)
Addended by: Ardyth ManARTER, Gaylon Melchor Z on: 09/25/2017 03:11 PM   Modules accepted: Orders

## 2017-09-25 NOTE — Progress Notes (Signed)
Gastroenterology Pre-Procedure Review  Request Date: 2/22 Requesting Physician: Dr. Tobi BastosAnna  PATIENT REVIEW QUESTIONS: The patient responded to the following health history questions as indicated:    1. Are you having any GI issues? no 2. Do you have a personal history of Polyps? yes (2009) 3. Do you have a family history of Colon Cancer or Polyps? no 4. Diabetes Mellitus? no 5. Joint replacements in the past 12 months?no 6. Major health problems in the past 3 months?no 7. Any artificial heart valves, MVP, or defibrillator?yes (pacemaker)    MEDICATIONS & ALLERGIES:    Patient reports the following regarding taking any anticoagulation/antiplatelet therapy:   Plavix, Coumadin, Eliquis, Xarelto, Lovenox, Pradaxa, Brilinta, or Effient? no Aspirin? no  Patient confirms/reports the following medications:  Current Outpatient Medications  Medication Sig Dispense Refill  . albuterol (PROVENTIL HFA;VENTOLIN HFA) 108 (90 Base) MCG/ACT inhaler Inhale 2 puffs into the lungs every 6 (six) hours as needed for wheezing or shortness of breath. 1 Inhaler 2  . azelastine (OPTIVAR) 0.05 % ophthalmic solution Place 1 drop every morning into both eyes.  3  . BREO ELLIPTA 200-25 MCG/INH AEPB Inhale 1 puff every morning into the lungs.  3  . buPROPion (WELLBUTRIN XL) 300 MG 24 hr tablet Take 1 tablet (300 mg total) by mouth daily. 90 tablet 3  . furosemide (LASIX) 20 MG tablet Take 1 tablet (20 mg total) by mouth 3 (three) times a week. 10 tablet 5  . ibuprofen (ADVIL,MOTRIN) 800 MG tablet Take 1 tablet (800 mg total) by mouth every 8 (eight) hours as needed for moderate pain. 15 tablet 0  . Ibuprofen-Diphenhydramine Cit (ADVIL PM PO) Take 1 tablet by mouth at bedtime as needed.    Marland Kitchen. levocetirizine (XYZAL) 5 MG tablet Take 5 mg daily by mouth.  3  . montelukast (SINGULAIR) 10 MG tablet Take 1 tablet (10 mg total) by mouth at bedtime. 30 tablet 3  . rizatriptan (MAXALT) 10 MG tablet Take 1 tablet (10 mg total)  by mouth as needed for migraine. May repeat in 2 hours if needed 10 tablet 12  . topiramate (TOPAMAX) 100 MG tablet Take 1 tablet (100 mg total) by mouth daily. 30 tablet 5   No current facility-administered medications for this visit.     Patient confirms/reports the following allergies:  Allergies  Allergen Reactions  . Amoxicillin Other (See Comments)  . Clavulanic Acid Other (See Comments)  . Morphine And Related     vomiting    No orders of the defined types were placed in this encounter.   AUTHORIZATION INFORMATION Primary Insurance: 1D#: Group #:  Secondary Insurance: 1D#: Group #:  SCHEDULE INFORMATION: Date:  Time: Location:

## 2017-09-25 NOTE — Telephone Encounter (Signed)
   [facility] Pre-operative Risk Assessment    Request for surgical clearance:  1. What type of surgery is being performed?  Colonoscopy    2. When is this surgery scheduled?    11/13/2017  3. Are there any medications that need to be held prior to surgery and how long?  Please advise on any meds that need to be stopped  prior to procedure  4. Practice name and name of physician performing surgery?   [facility]   5. What is your office phone and fax number?   Office: [phone]  Fax: [phone]  Attn: [name]   6. Anesthesia type (None, local, MAC, general) ?  General anesthesia  Please advise if this patient will requires an office visit or further medical work-up before clearance can be given. Please call [phone] and leave a message with the triage nurse if patient needs office visit for clearance.   Emma Rogers L 09/25/2017, 4:26 PM  _________________________________________________________________   (provider comments below)

## 2017-09-28 ENCOUNTER — Telehealth: Payer: Self-pay | Admitting: Gastroenterology

## 2017-09-28 NOTE — Telephone Encounter (Signed)
Gastroenterology Pre-Procedure Review  Request Date:   Requesting Physician: Dr.    PATIENT REVIEW QUESTIONS: The patient responded to the following health history questions as indicated:    1. Are you having any GI issues? no 2. Do you have a personal history of Polyps? yes (2009) 3. Do you have a family history of Colon Cancer or Polyps? no 4. Diabetes Mellitus? no 5. Joint replacements in the past 12 months?no 6. Major health problems in the past 3 months?no 7. Any artificial heart valves, MVP, or defibrillator?yes (pace maker)    MEDICATIONS & ALLERGIES:    Patient reports the following regarding taking any anticoagulation/antiplatelet therapy:   Plavix, Coumadin, Eliquis, Xarelto, Lovenox, Pradaxa, Brilinta, or Effient? no Aspirin? no  Patient confirms/reports the following medications:  Current Outpatient Medications  Medication Sig Dispense Refill   albuterol (PROVENTIL HFA;VENTOLIN HFA) 108 (90 Base) MCG/ACT inhaler Inhale 2 puffs into the lungs every 6 (six) hours as needed for wheezing or shortness of breath. 1 Inhaler 2   azelastine (OPTIVAR) 0.05 % ophthalmic solution Place 1 drop every morning into both eyes.  3   BREO ELLIPTA 200-25 MCG/INH AEPB Inhale 1 puff every morning into the lungs.  3   buPROPion (WELLBUTRIN XL) 150 MG 24 hr tablet   0   buPROPion (WELLBUTRIN XL) 300 MG 24 hr tablet Take 1 tablet (300 mg total) by mouth daily. 90 tablet 3   furosemide (LASIX) 20 MG tablet Take 1 tablet (20 mg total) by mouth 3 (three) times a week. 10 tablet 5   ibuprofen (ADVIL,MOTRIN) 800 MG tablet Take 1 tablet (800 mg total) by mouth every 8 (eight) hours as needed for moderate pain. 15 tablet 0   Ibuprofen-Diphenhydramine Cit (ADVIL PM PO) Take 1 tablet by mouth at bedtime as needed.     levocetirizine (XYZAL) 5 MG tablet Take 5 mg daily by mouth.  3   montelukast (SINGULAIR) 10 MG tablet Take 1 tablet (10 mg total) by mouth at bedtime. 30 tablet 3   rizatriptan  (MAXALT) 10 MG tablet Take 1 tablet (10 mg total) by mouth as needed for migraine. May repeat in 2 hours if needed 10 tablet 12   topiramate (TOPAMAX) 100 MG tablet Take 1 tablet (100 mg total) by mouth daily. 30 tablet 5   No current facility-administered medications for this visit.     Patient confirms/reports the following allergies:  Allergies  Allergen Reactions   Amoxicillin Other (See Comments)   Clavulanic Acid Other (See Comments)   Morphine And Related     vomiting    No orders of the defined types were placed in this encounter.   AUTHORIZATION INFORMATION Primary Insurance: 1D#: Group #:  Secondary Insurance: 1D#: Group #:  SCHEDULE INFORMATION: Date:  Time: Location:

## 2017-09-28 NOTE — Telephone Encounter (Signed)
     Primary Cardiologist:Will Jorja LoaMartin Camnitz, MD  Chart reviewed as part of pre-operative protocol coverage. Because of Emma Rogers's past medical history and time since last visit, he/she will require a follow-up visit in order to better assess preoperative cardiovascular risk. It appears she's been lost to device follow-up since July - notes in 04/2017 indicate recommendation to send remote transmission but do not see anything's come through since then. She had had SVT 1:1 conduction at time of 03/2017 interrogation and this needs to be followed up.  Pre-op covering staff: - Please schedule appointment and call patient to inform them. EP team preferable. - Please contact requesting surgeon's office via preferred method (i.e, phone, fax) to inform them of need for appointment prior to surgery.  Laurann Montanaayna N Dunn, PA-C  09/28/2017, 5:20 PM

## 2017-09-29 NOTE — Telephone Encounter (Signed)
EP scheduler has been given information to contact pt with an appt for clearance. Message has been left at requesting office to let them know pt will require an appt before clearance can be given.

## 2017-10-06 ENCOUNTER — Other Ambulatory Visit: Payer: Self-pay | Admitting: Family Medicine

## 2017-10-07 NOTE — Telephone Encounter (Signed)
I s/w Emma Rogers, EP scheduler to see if she has been able to reach the pt yet to get her an appt for surgery clearance. Melissa states she has left message for the pt though pt has not called back. Melissa states she will call the pt now and see if she can come in tomorrow for her surgery clearance appt.

## 2017-10-09 ENCOUNTER — Ambulatory Visit (INDEPENDENT_AMBULATORY_CARE_PROVIDER_SITE_OTHER): Payer: No Typology Code available for payment source | Admitting: Family Medicine

## 2017-10-09 ENCOUNTER — Encounter: Payer: Self-pay | Admitting: *Deleted

## 2017-10-09 ENCOUNTER — Encounter: Payer: Self-pay | Admitting: Family Medicine

## 2017-10-09 VITALS — BP 146/80 | HR 100 | Temp 98.4°F | Ht 66.0 in | Wt 212.5 lb

## 2017-10-09 DIAGNOSIS — R030 Elevated blood-pressure reading, without diagnosis of hypertension: Secondary | ICD-10-CM

## 2017-10-09 DIAGNOSIS — Z5181 Encounter for therapeutic drug level monitoring: Secondary | ICD-10-CM | POA: Diagnosis not present

## 2017-10-09 DIAGNOSIS — F419 Anxiety disorder, unspecified: Secondary | ICD-10-CM | POA: Diagnosis not present

## 2017-10-09 DIAGNOSIS — F329 Major depressive disorder, single episode, unspecified: Secondary | ICD-10-CM | POA: Diagnosis not present

## 2017-10-09 DIAGNOSIS — E669 Obesity, unspecified: Secondary | ICD-10-CM | POA: Diagnosis not present

## 2017-10-09 DIAGNOSIS — F32A Depression, unspecified: Secondary | ICD-10-CM

## 2017-10-09 DIAGNOSIS — J069 Acute upper respiratory infection, unspecified: Secondary | ICD-10-CM

## 2017-10-09 DIAGNOSIS — R7303 Prediabetes: Secondary | ICD-10-CM | POA: Diagnosis not present

## 2017-10-09 MED ORDER — BENZONATATE 100 MG PO CAPS: 100.0000 mg | ORAL_CAPSULE | Freq: Three times a day (TID) | ORAL | 0 refills | Status: DC | PRN

## 2017-10-09 MED ORDER — AZITHROMYCIN 250 MG PO TABS
ORAL_TABLET | ORAL | 0 refills | Status: DC
Start: 2017-10-09 — End: 2017-11-06

## 2017-10-09 MED ORDER — BUSPIRONE HCL 5 MG PO TABS: 5.0000 mg | ORAL_TABLET | Freq: Two times a day (BID) | ORAL | 1 refills | Status: DC | PRN

## 2017-10-09 NOTE — Progress Notes (Signed)
BP (!) 146/80 (BP Location: Right Arm, Patient Position: Sitting, Cuff Size: Large)   Pulse 100   Temp 98.4 F (36.9 C) (Oral)   Ht 5\' 6"  (1.676 m)   Wt 212 lb 8 oz (96.4 kg)   SpO2 99%   BMI 34.30 kg/m    Subjective:    Patient ID: Emma Rogers, female    DOB: 1966/08/01, 52 y.o.   MRN: [id number]  HPI: Emma Rogers is a 52 y.o. female  Chief Complaint  Patient presents with  . URI    Began as a sore throat, has now progressed to cough   . Medication Management    HPI Patient is here for two things She initially booked the appointment for medicine management She is not sure if she is experiencing something, some anxiety; not sure if the wellbutrin is okay for that, not sure if maybe causing it; just having episodes of discouragement; really struggling with menopause; discouragement is the best word she can think of; not doing a good job at being a wife or employee; feels very anxious about things; odd for her; life in general, getting older, worried about her grandchild, lots of needless worries She has also tried celexa When husband divorced her, she took celexa; doesn't really remember any problems; took it for a little while, just to get through the hump No sleepiness with the bupropion Not seeing a counselor yet; struggling to step out and do; that is one of them, having a hard time with that, anxiety over that Lots of anxiety in a lot of different areas in her liefef; mother suffers from it Not much caffeine, struggling at night Having some fogginess, struggling to concentrate at work Not horrible hot flashes, some night sweats  However, she thinks she has come down with something; she has had sore throat, laryngitis, head congestion/nasal congestion, and is now developing a cough; she has not used anything for her symptoms   Depression screen [facility] 2/9 10/09/2017 09/03/2017 08/04/2017 03/31/2017 02/20/2017  Decreased Interest 1 0 1 0 0  Down, Depressed, Hopeless 1 0  1 0 0  PHQ - 2 Score 2 0 2 0 0  Altered sleeping 3 - 1 - -  Tired, decreased energy 3 - 1 - -  Change in appetite 1 - 0 - -  Feeling bad or failure about yourself  1 - 1 - -  Trouble concentrating 2 - 1 - -  Moving slowly or fidgety/restless 0 - 0 - -  Suicidal thoughts 0 - 0 - -  PHQ-9 Score 12 - 6 - -  Difficult doing work/chores Very difficult - - - -    Relevant past medical, surgical, family and social history reviewed Past Medical History:  Diagnosis Date  . Allergic rhinitis   . Asthma   . Depression   . WUJWJXBJ(478.2)    Past Surgical History:  Procedure Laterality Date  . ABDOMINAL HYSTERECTOMY  2009   still has 1 ovary  . BREAST BIOPSY Left 09/30/2002   Ductogram-neg  . INSERT / REPLACE / REMOVE PACEMAKER     Permanent pacemaker, Dr. [name]  . Lobe hemithroidectomy     Right   Family History  Problem Relation Age of Onset  . Heart disease Father        pacemaker,arrythmias  . Asthma Other   . Coronary artery disease Other        Female 1st degree relative <50  . Lung cancer Other   .  Prostate cancer Other        1st degree relative <50  . COPD Maternal Grandmother   . Breast cancer Maternal Grandmother   . Heart disease Maternal Grandfather   . Liver disease Paternal Grandmother   . Heart disease Paternal Grandfather    Social History   Tobacco Use  . Smoking status: Never Smoker  . Smokeless tobacco: Never Used  Substance Use Topics  . Alcohol use: No    Alcohol/week: 0.0 oz  . Drug use: No    Interim medical history since last visit reviewed. Allergies and medications reviewed  Review of Systems Per HPI unless specifically indicated above     Objective:    BP (!) 146/80 (BP Location: Right Arm, Patient Position: Sitting, Cuff Size: Large)   Pulse 100   Temp 98.4 F (36.9 C) (Oral)   Ht 5\' 6"  (1.676 m)   Wt 212 lb 8 oz (96.4 kg)   SpO2 99%   BMI 34.30 kg/m   Wt Readings from Last 3 Encounters:  10/09/17 212 lb 8 oz (96.4 kg)    09/03/17 221 lb 3.2 oz (100.3 kg)  08/04/17 223 lb 3.2 oz (101.2 kg)    Physical Exam  Constitutional: She appears well-developed and well-nourished. No distress.  Eyes: EOM are normal. No scleral icterus.  Neck: No thyromegaly present.  Cardiovascular: Normal rate.  Pulmonary/Chest: Effort normal.  Abdominal: She exhibits no distension.  Musculoskeletal: She exhibits no edema (no pitting edema).  Skin: She is not diaphoretic. No pallor.  Psychiatric: Her behavior is normal. Judgment and thought content normal.  Good eye contact with examiner; mildly anxious, somewhat flat affect       Assessment & Plan:   Problem List Items Addressed This Visit      Other   Elevated blood pressure reading    Encouraged DASH guidelines, will be working with her to allay stress and anxiety; encouraged counseling; see AVS      Depression    Related to menopause; supportive listening; suggested counseling; adjusted medicines; call with update in one week      Relevant Medications   busPIRone (BUSPAR) 5 MG tablet   Anxiety    Will start buspirone; advised that I would not recommend benzos because of addiction potential; she will start buspar and we can increase/adjust dose as needed; update me in one week      Relevant Medications   busPIRone (BUSPAR) 5 MG tablet    Other Visit Diagnoses    Upper respiratory tract infection, unspecified type    -  Primary   suspect this is viral; near weekend, so Rx given in case needed; she may call on-call provider for guidance if getting worse, but should resolve on its own   Relevant Medications   azithromycin (ZITHROMAX) 250 MG tablet      Follow up plan: Return in about 4 weeks (around 11/06/2017).  An after-visit summary was printed and given to the patient at check-out.  Please see the patient instructions which may contain other information and recommendations beyond what is mentioned above in the assessment and plan.  Meds ordered this  encounter  Medications  . azithromycin (ZITHROMAX) 250 MG tablet    Sig: Two by mouth on first day, then one daily for four more days    Dispense:  6 tablet    Refill:  0    Okay to leave on file until she calls  . benzonatate (TESSALON) 100 MG capsule  Sig: Take 1 capsule (100 mg total) by mouth 3 (three) times daily as needed for cough.    Dispense:  30 capsule    Refill:  0  . busPIRone (BUSPAR) 5 MG tablet    Sig: Take 1 tablet (5 mg total) by mouth 2 (two) times daily as needed.    Dispense:  60 tablet    Refill:  1    No orders of the defined types were placed in this encounter.

## 2017-10-09 NOTE — Telephone Encounter (Signed)
I have s/w Glynda JaegerMelissa Tatum, EP Scheduler a few times who states she has called and left messages to call and make an appt for her surgery clearance with Dr. Elberta Fortisamnitz. Since we have been unable to reach pt by phone I have [facility] asking for her to please call and ask for Glynda JaegerMelissa Tatum to schedule Dr. Girard Cooteramintz appt.

## 2017-10-09 NOTE — Patient Instructions (Addendum)
Try to use PLAIN allergy medicine without the decongestant Avoid: phenylephrine, phenylpropanolamine, and pseudoephredine Try to follow the DASH guidelines (DASH stands for Dietary Approaches to Stop Hypertension). Try to limit the sodium in your diet to no more than 1,500mg  of sodium per day. Certainly try to not exceed 2,000 mg per day at the very most. Do not add salt when cooking or at the table.  Check the sodium amount on labels when shopping, and choose items lower in sodium when given a choice. Avoid or limit foods that already contain a lot of sodium. Eat a diet rich in fruits and vegetables and whole grains, and try to lose weight if overweight or obese  12 Ways to Curb Anxiety  ?Anxiety is normal human sensation. It is what helped our ancestors survive the pitfalls of the wilderness. Anxiety is defined as experiencing worry or nervousness about an imminent event or something with an uncertain outcome. It is a feeling experienced by most people at some point in their lives. Anxiety can be triggered by a very personal issue, such as the illness of a loved one, or an event of global proportions, such as a refugee crisis. Some of the symptoms of anxiety are:  Feeling restless.  Having a feeling of impending danger.  Increased heart rate.  Rapid breathing. Sweating.  Shaking.  Weakness or feeling tired.  Difficulty concentrating on anything except the current worry.  Insomnia.  Stomach or bowel problems. What can we do about anxiety we may be feeling? There are many techniques to help manage stress and relax. Here are 12 ways you can reduce your anxiety almost immediately: 1. Turn off the constant feed of information. Take a social media sabbatical. Studies have shown that social media directly contributes to social anxiety.  2. Monitor your television viewing habits. Are you watching shows that are also contributing to your anxiety, such as 24-hour news stations? Try watching something  else, or better yet, nothing at all. Instead, listen to music, read an inspirational book or practice a hobby. 3. Eat nutritious meals. Also, don't skip meals and keep healthful snacks on hand. Hunger and poor diet contributes to feeling anxious. 4. Sleep. Sleeping on a regular schedule for at least seven to eight hours a night will do wonders for your outlook when you are awake. 5. Exercise. Regular exercise will help rid your body of that anxious energy and help you get more restful sleep. 6. Try deep (diaphragmatic) breathing. Inhale slowly through your nose for five seconds and exhale through your mouth. 7. Practice acceptance and gratitude. When anxiety hits, accept that there are things out of your control that shouldn't be of immediate concern.  8. Seek out humor. When anxiety strikes, watch a funny video, read jokes or call a friend who makes you laugh. Laughter is healing for our bodies and releases endorphins that are calming. 9. Stay positive. Take the effort to replace negative thoughts with positive ones. Try to see a stressful situation in a positive light. Try to come up with solutions rather than dwelling on the problem. 10. Figure out what triggers your anxiety. Keep a journal and make note of anxious moments and the events surrounding them. This will help you identify triggers you can avoid or even eliminate. 11. Talk to someone. Let a trusted friend, family member or even trained professional know that you are feeling overwhelmed and anxious. Verbalize what you are feeling and why.  12. Volunteer. If your anxiety is triggered by  a crisis on a large scale, become an advocate and work to resolve the problem that is causing you unease. Anxiety is often unwelcome and can become overwhelming. If not kept in check, it can become a disorder that could require medical treatment. However, if you take the time to care for yourself and avoid the triggers that make you anxious, you will be able to  find moments of relaxation and clarity that make your life much more enjoyable.  Call me in one week with an update

## 2017-10-10 ENCOUNTER — Other Ambulatory Visit: Payer: Self-pay | Admitting: Family Medicine

## 2017-10-10 DIAGNOSIS — N182 Chronic kidney disease, stage 2 (mild): Secondary | ICD-10-CM

## 2017-10-10 DIAGNOSIS — E7849 Other hyperlipidemia: Secondary | ICD-10-CM | POA: Insufficient documentation

## 2017-10-10 LAB — COMPLETE METABOLIC PANEL WITH GFR
AG Ratio: 1.7 (calc) (ref 1.0–2.5)
ALT: 18 U/L (ref 6–29)
AST: 16 U/L (ref 10–35)
Albumin: 4.6 g/dL (ref 3.6–5.1)
Alkaline phosphatase (APISO): 56 U/L (ref 33–130)
BUN/Creatinine Ratio: 14 (calc) (ref 6–22)
BUN: 17 mg/dL (ref 7–25)
CALCIUM: 9.8 mg/dL (ref 8.6–10.4)
CO2: 28 mmol/L (ref 20–32)
CREATININE: 1.21 mg/dL — AB (ref 0.50–1.05)
Chloride: 105 mmol/L (ref 98–110)
GFR, EST NON AFRICAN AMERICAN: 52 mL/min/{1.73_m2} — AB (ref >=60)
GFR, Est African American: 60 mL/min/{1.73_m2} (ref >=60)
GLOBULIN: 2.7 g/dL (ref 1.9–3.7)
GLUCOSE: 94 mg/dL (ref 65–99)
Potassium: 4.3 mmol/L (ref 3.5–5.3)
Sodium: 139 mmol/L (ref 135–146)
Total Bilirubin: 0.5 mg/dL (ref 0.2–1.2)
Total Protein: 7.3 g/dL (ref 6.1–8.1)

## 2017-10-10 LAB — LIPID PANEL
CHOL/HDL RATIO: 6.6 (calc) — AB (ref <5.0)
CHOLESTEROL: 325 mg/dL — AB (ref <200)
HDL: 49 mg/dL — AB (ref >50)
LDL CHOLESTEROL (CALC): 222 mg/dL — AB
NON-HDL CHOLESTEROL (CALC): 276 mg/dL — AB (ref <130)
TRIGLYCERIDES: 316 mg/dL — AB (ref <150)

## 2017-10-10 LAB — HEMOGLOBIN A1C
Hgb A1c MFr Bld: 6 % of total Hgb — ABNORMAL HIGH (ref <5.7)
Mean Plasma Glucose: 126 (calc)
eAG (mmol/L): 7 (calc)

## 2017-10-10 MED ORDER — ATORVASTATIN CALCIUM 40 MG PO TABS: 40.0000 mg | ORAL_TABLET | Freq: Every day | ORAL | 1 refills | Status: DC

## 2017-10-10 NOTE — Assessment & Plan Note (Signed)
Start statin; recheck lipids in 6-8 weeks 

## 2017-10-10 NOTE — Progress Notes (Signed)
Note to patient Limit diuretics Work on weight loss Start statin Recheck kidney function and lipids in 6-8 weeks

## 2017-10-13 ENCOUNTER — Encounter: Payer: Self-pay | Admitting: Family Medicine

## 2017-10-13 DIAGNOSIS — R030 Elevated blood-pressure reading, without diagnosis of hypertension: Secondary | ICD-10-CM | POA: Insufficient documentation

## 2017-10-13 DIAGNOSIS — F419 Anxiety disorder, unspecified: Secondary | ICD-10-CM | POA: Insufficient documentation

## 2017-10-13 NOTE — Assessment & Plan Note (Signed)
Related to menopause; supportive listening; suggested counseling; adjusted medicines; call with update in one week

## 2017-10-13 NOTE — Assessment & Plan Note (Signed)
Will start buspirone; advised that I would not recommend benzos because of addiction potential; she will start buspar and we can increase/adjust dose as needed; update me in one week

## 2017-10-13 NOTE — Assessment & Plan Note (Signed)
Encouraged DASH guidelines, will be working with her to allay stress and anxiety; encouraged counseling; see AVS

## 2017-10-13 NOTE — Telephone Encounter (Signed)
Okie, can you please adjust her weight for the last visit? She says it was 212.5 pounds, not 225 pounds Thank you

## 2017-10-16 ENCOUNTER — Encounter: Payer: Self-pay | Admitting: Family Medicine

## 2017-10-19 ENCOUNTER — Ambulatory Visit (INDEPENDENT_AMBULATORY_CARE_PROVIDER_SITE_OTHER): Payer: No Typology Code available for payment source | Admitting: Family Medicine

## 2017-10-19 ENCOUNTER — Encounter: Payer: Self-pay | Admitting: Family Medicine

## 2017-10-19 VITALS — BP 124/82 | HR 80 | Temp 98.3°F | Resp 16 | Ht 66.0 in | Wt 219.5 lb

## 2017-10-19 DIAGNOSIS — H1031 Unspecified acute conjunctivitis, right eye: Secondary | ICD-10-CM | POA: Diagnosis not present

## 2017-10-19 NOTE — Patient Instructions (Signed)
Please call [facility] prior to your appointment today to verify referral. Your appointment is with Dr. Emilie Rutterrey Benfield at 1:45pm [facility].  [facility]: [phone]

## 2017-10-19 NOTE — Progress Notes (Signed)
Name: Emma Rogers   MRN: [id number]    DOB: 13-Aug-1966   Date:10/19/2017       Progress Note  Subjective  Chief Complaint  Chief Complaint  Patient presents with  . Eye Problem    burning, red, irritated today    HPI  PT presents with concern for eye issue.  She wears contact lenses, and noticed RIGHT eye conjunctiva was very red yesterday - she was unable to take her contacts out because she was in the hospital with her Dad; had trouble getting her contact out of her eye at the end of the day last night.  Endorses stinging.  No watering or discharge, no crusting, no pain.  No recent injury. No vision changes.  No history of glaucoma.  Patient Active Problem List   Diagnosis Date Noted  . Elevated blood pressure reading 10/13/2017  . Anxiety 10/13/2017  . Familial hyperlipidemia 10/10/2017  . Bilateral leg edema 09/03/2017  . Depression 09/03/2017  . CKD (chronic kidney disease) stage 2, GFR 60-89 ml/min 09/03/2017  . Vitamin D deficiency 09/03/2017  . Vitamin B12 deficiency 09/03/2017  . Medication monitoring encounter 09/03/2017  . Insomnia 08/11/2017  . Menopausal symptoms 08/11/2017  . Shortness of breath 05/18/2017  . Chest pain 03/31/2017  . Migraine without aura and without status migrainosus, not intractable 02/21/2017  . Prediabetes 02/20/2017  . Fatigue 02/20/2017  . Obesity (BMI 35.0-39.9 without comorbidity) 02/20/2017  . 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 VASOVAGAL SYNCOPE 11/27/2009  . Allergic rhinitis 01/18/2008  . HEADACHE 01/18/2008    Social History   Tobacco Use  . Smoking status: Never Smoker  . Smokeless tobacco: Never Used  Substance Use Topics  . Alcohol use: No    Alcohol/week: 0.0 oz     Current Outpatient Medications:  .  albuterol (PROVENTIL HFA;VENTOLIN HFA) 108 (90 Base) MCG/ACT inhaler, Inhale 2 puffs into the lungs every 6 (six) hours as needed for wheezing or shortness of breath., Disp: 1 Inhaler, Rfl: 2 .  atorvastatin (LIPITOR) 40  MG tablet, Take 1 tablet (40 mg total) by mouth at bedtime., Disp: 30 tablet, Rfl: 1 .  azelastine (OPTIVAR) 0.05 % ophthalmic solution, Place 1 drop every morning into both eyes., Disp: , Rfl: 3 .  buPROPion (WELLBUTRIN XL) 300 MG 24 hr tablet, Take 1 tablet (300 mg total) by mouth daily., Disp: 90 tablet, Rfl: 3 .  busPIRone (BUSPAR) 5 MG tablet, Take 1 tablet (5 mg total) by mouth 2 (two) times daily as needed., Disp: 60 tablet, Rfl: 1 .  furosemide (LASIX) 20 MG tablet, Take 1 tablet (20 mg total) by mouth 3 (three) times a week., Disp: 10 tablet, Rfl: 5 .  levocetirizine (XYZAL) 5 MG tablet, Take 5 mg daily by mouth., Disp: , Rfl: 3 .  montelukast (SINGULAIR) 10 MG tablet, TAKE 1 TABLET (10 MG TOTAL) BY MOUTH AT BEDTIME., Disp: 30 tablet, Rfl: 11 .  rizatriptan (MAXALT) 10 MG tablet, Take 1 tablet (10 mg total) by mouth as needed for migraine. May repeat in 2 hours if needed, Disp: 10 tablet, Rfl: 12 .  topiramate (TOPAMAX) 100 MG tablet, Take 1 tablet (100 mg total) by mouth daily., Disp: 30 tablet, Rfl: 5 .  azithromycin (ZITHROMAX) 250 MG tablet, Two by mouth on first day, then one daily for four more days (Patient not taking: Reported on 10/19/2017), Disp: 6 tablet, Rfl: 0 .  benzonatate (TESSALON) 100 MG capsule, Take 1 capsule (100 mg total) by mouth 3 (three) times  daily as needed for cough. (Patient not taking: Reported on 10/19/2017), Disp: 30 capsule, Rfl: 0 .  BREO ELLIPTA 200-25 MCG/INH AEPB, Inhale 1 puff every morning into the lungs., Disp: , Rfl: 3 .  Ibuprofen-Diphenhydramine Cit (ADVIL PM PO), Take 1 tablet by mouth at bedtime as needed., Disp: , Rfl:  .  SUPREP BOWEL PREP KIT 17.5-3.13-1.6 GM/177ML SOLN, , Disp: , Rfl: 0  Allergies  Allergen Reactions  . Amoxicillin Other (See Comments)  . Clavulanic Acid Other (See Comments)  . Morphine And Related     vomiting    ROS  Constitutional: Negative for fever or weight change.  Respiratory: Negative for cough and shortness  of breath.   Cardiovascular: Negative for chest pain or palpitations.  Gastrointestinal: Negative for abdominal pain, no bowel changes.  Musculoskeletal: Negative for gait problem or joint swelling.  Skin: Negative for rash.  Neurological: Negative for dizziness or headache.  No other specific complaints in a complete review of systems (except as listed in HPI above).  Objective  Vitals:   10/19/17 1127  BP: 124/82  Pulse: 80  Resp: 16  Temp: 98.3 F (36.8 C)  TempSrc: Oral  SpO2: 96%  Weight: 219 lb 8 oz (99.6 kg)  Height: 5' 6"  (1.676 m)   Body mass index is 35.43 kg/m.  Nursing Note and Vital Signs reviewed.  Physical Exam   Constitutional: Patient appears well-developed and well-nourished. Obese No distress.  HEENT: head atraumatic, normocephalic, pupils equal and reactive to light, EOM's intact, RIGHT conjunctiva is erythematous; no periorbital swelling or tenderness, no pain with ocular movements, no discharge or watering of the eye.  Neck supple without lymphadenopathy, oropharynx pink and moist without exudate Cardiovascular: Normal rate, regular rhythm, S1/S2 present.  No murmur or rub heard. No BLE edema. Pulmonary/Chest: Effort normal and breath sounds clear. No respiratory distress or retractions. Psychiatric: Patient has a normal mood and affect. behavior is normal. Judgment and thought content normal.  No results found for this or any previous visit (from the past 72 hour(s)).  Assessment & Plan  1. Acute conjunctivitis of right eye, unspecified acute conjunctivitis type - Ambulatory referral to Optometry - Pt has established with [facility], and prefers to be seen by them for this issue; however, she has a new insurance plan which requires her to obtain referral from PCP prior to attending an appointment.  Appointment at [facility] is set with Dr. Tempie Donning at 1:45pm today.   -Advised that without fluorescence stain w/ woods lamp examination and/or  tonometry, I am unable to properly assess her eye condition, and that she should maintain follow up with Optometrist to better determine course of treatment needed.

## 2017-10-30 ENCOUNTER — Encounter: Payer: Self-pay | Admitting: Cardiology

## 2017-11-02 ENCOUNTER — Telehealth: Payer: Self-pay

## 2017-11-02 NOTE — Telephone Encounter (Signed)
LVM for patient callback.   [facility] were [facility] to contact patient for surgical clearance appointment.

## 2017-11-04 ENCOUNTER — Telehealth: Payer: Self-pay | Admitting: Gastroenterology

## 2017-11-04 NOTE — Telephone Encounter (Signed)
Patient needs to cancel her procedure on 11/13/17 w/ Dr. Tobi BastosAnna due to a death in the family. Will r/s at a later date.

## 2017-11-05 NOTE — Telephone Encounter (Signed)
Thanks procedures have been canceled.

## 2017-11-06 ENCOUNTER — Encounter: Payer: Self-pay | Admitting: Family Medicine

## 2017-11-06 MED ORDER — BENZONATATE 100 MG PO CAPS: 100.0000 mg | ORAL_CAPSULE | Freq: Three times a day (TID) | ORAL | 0 refills | Status: DC | PRN

## 2017-11-07 ENCOUNTER — Encounter: Payer: Self-pay | Admitting: *Deleted

## 2017-11-07 ENCOUNTER — Ambulatory Visit
Admission: EM | Admit: 2017-11-07 | Discharge: 2017-11-07 | Disposition: A | Discharge status: Home / Self Care | Payer: No Typology Code available for payment source | Attending: Emergency Medicine | Admitting: Emergency Medicine

## 2017-11-07 ENCOUNTER — Other Ambulatory Visit: Payer: Self-pay

## 2017-11-07 DIAGNOSIS — R05 Cough: Secondary | ICD-10-CM | POA: Diagnosis not present

## 2017-11-07 DIAGNOSIS — R51 Headache: Secondary | ICD-10-CM | POA: Diagnosis not present

## 2017-11-07 DIAGNOSIS — J45901 Unspecified asthma with (acute) exacerbation: Secondary | ICD-10-CM

## 2017-11-07 DIAGNOSIS — R0602 Shortness of breath: Secondary | ICD-10-CM

## 2017-11-07 DIAGNOSIS — R062 Wheezing: Secondary | ICD-10-CM | POA: Diagnosis not present

## 2017-11-07 DIAGNOSIS — J101 Influenza due to other identified influenza virus with other respiratory manifestations: Secondary | ICD-10-CM | POA: Diagnosis not present

## 2017-11-07 DIAGNOSIS — M791 Myalgia, unspecified site: Secondary | ICD-10-CM | POA: Diagnosis not present

## 2017-11-07 DIAGNOSIS — R509 Fever, unspecified: Secondary | ICD-10-CM | POA: Diagnosis not present

## 2017-11-07 LAB — RAPID INFLUENZA A&B ANTIGENS
Influenza A (ARMC): POSITIVE — AB
Influenza B (ARMC): NEGATIVE

## 2017-11-07 MED ORDER — IBUPROFEN 400 MG PO TABS
400.0000 mg | ORAL_TABLET | Freq: Once | ORAL | Status: AC
  Administered 2017-11-07: 400 mg via ORAL

## 2017-11-07 MED ORDER — HYDROCOD POLST-CPM POLST ER 10-8 MG/5ML PO SUER: 5.0000 mL | Freq: Two times a day (BID) | ORAL | 0 refills | Status: DC | PRN

## 2017-11-07 MED ORDER — IPRATROPIUM-ALBUTEROL 0.5-2.5 (3) MG/3ML IN SOLN
3.0000 mL | Freq: Once | RESPIRATORY_TRACT | Status: AC
  Administered 2017-11-07: 3 mL via RESPIRATORY_TRACT

## 2017-11-07 MED ORDER — ALBUTEROL SULFATE HFA 108 (90 BASE) MCG/ACT IN AERS: 2.0000 | INHALATION_SPRAY | RESPIRATORY_TRACT | 0 refills | Status: DC | PRN

## 2017-11-07 MED ORDER — OSELTAMIVIR PHOSPHATE 75 MG PO CAPS: 75.0000 mg | ORAL_CAPSULE | Freq: Two times a day (BID) | ORAL | 0 refills | Status: DC

## 2017-11-07 MED ORDER — ALBUTEROL SULFATE HFA 108 (90 BASE) MCG/ACT IN AERS: 1.0000 | INHALATION_SPRAY | Freq: Four times a day (QID) | RESPIRATORY_TRACT | 0 refills | Status: DC | PRN

## 2017-11-07 MED ORDER — ACETAMINOPHEN 500 MG PO TABS
1000.0000 mg | ORAL_TABLET | Freq: Once | ORAL | Status: AC
  Administered 2017-11-07: 1000 mg via ORAL

## 2017-11-07 NOTE — ED Triage Notes (Signed)
Patient started having symptoms of nasal congestion drainage, cough, fever, and body aches yesterday.

## 2017-11-07 NOTE — Progress Notes (Signed)
Closing out lab/order note open since:  02/23/17

## 2017-11-07 NOTE — Telephone Encounter (Signed)
This message from 05/12/17 does not appear to have been routed to me The patient has been seen since this visit and is now on singulair I will make IT aware of this missed Mychart note

## 2017-11-07 NOTE — Telephone Encounter (Signed)
Signing off on old MyChart message from colleague

## 2017-11-07 NOTE — Telephone Encounter (Signed)
Message was not forwarded to me This Rx has already been handled Signing off on note  Routing History   Priority Sent On From To Message Type   08/19/2017 1:12 PM Gomez CleverlyBrown, Rhonda B Graves, Jamie, CMA    08/19/2017 1:06 PM Tonna CornerBarksdale, Harvey B P [facility] POOL   Created by   Jonette EvaBarksdale, Harvey B on 08/19/2017 01:05 PM

## 2017-11-07 NOTE — Discharge Instructions (Signed)
2 puffs from your albuterol inhaler every 4-6 hours as needed for coughing, wheezing, chest tightness.  1 g of Tylenol 3-4 times a day as needed for fevers, body aches.  Tessalon for the cough during the day, Tussionex for the cough at night.  Push electrolyte containing fluids such as Pedialyte.

## 2017-11-07 NOTE — Progress Notes (Signed)
Closing out lab/order note open since:  02/24/17

## 2017-11-07 NOTE — ED Provider Notes (Signed)
HPI  SUBJECTIVE:  Emma Rogers is a 52 y.o. female who presents with acute onset of cough, fever 101.8, chills, body aches, headaches, shortness of breath, dyspnea on exertion, wheezing, chest pain described as tightness consistent with previous asthma exacerbations.  She states that her left ear hurts and reports mild sore throat.  She tried Maxalt, Tylenol, Tessalon with improvement in her symptoms.  Her last dose of Tylenol was more than 8 hours ago.  Symptoms are worse with activity.  No nasal congestion, sinus pain or pressure, postnasal drip.  No neck stiffness, photophobia, rash.  No abdominal pain, urinary complaints.  No flu contacts but she works at the [employer] in [place].  She did get a flu shot this year.  She has a past medical history of asthma, last admission was "years ago".  No intubations.  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  PMD: Arnetha Courser, MD    Past Medical History:  Diagnosis Date  . Allergic rhinitis   . Asthma   . Depression   . LHTDSKAJ(681.1)     Past Surgical History:  Procedure Laterality Date  . ABDOMINAL HYSTERECTOMY  2009   still has 1 ovary  . BREAST BIOPSY Left 09/30/2002   Ductogram-neg  . INSERT / REPLACE / REMOVE PACEMAKER     Permanent pacemaker, Dr. Caryl Comes  . Lobe hemithroidectomy     Right    Family History  Problem Relation Age of Onset  . Heart disease Father        pacemaker,arrythmias  . Asthma Other   . Coronary artery disease Other        Female 1st degree relative <50  . Lung cancer Other   . Prostate cancer Other        1st degree relative <50  . COPD Maternal Grandmother   . Breast cancer Maternal Grandmother   . Heart disease Maternal Grandfather   . Liver disease Paternal Grandmother   . Heart disease Paternal Grandfather     Social History   Tobacco Use  . Smoking status: Never Smoker  . Smokeless tobacco: Never Used  Substance Use  Topics  . Alcohol use: No    Alcohol/week: 0.0 oz  . Drug use: No    No current facility-administered medications for this encounter.   Current Outpatient Medications:  .  atorvastatin (LIPITOR) 40 MG tablet, Take 1 tablet (40 mg total) by mouth at bedtime., Disp: 30 tablet, Rfl: 1 .  azelastine (OPTIVAR) 0.05 % ophthalmic solution, Place 1 drop every morning into both eyes., Disp: , Rfl: 3 .  benzonatate (TESSALON) 100 MG capsule, Take 1 capsule (100 mg total) by mouth 3 (three) times daily as needed for cough., Disp: 30 capsule, Rfl: 0 .  BREO ELLIPTA 200-25 MCG/INH AEPB, Inhale 1 puff every morning into the lungs., Disp: , Rfl: 3 .  buPROPion (WELLBUTRIN XL) 300 MG 24 hr tablet, Take 1 tablet (300 mg total) by mouth daily., Disp: 90 tablet, Rfl: 3 .  busPIRone (BUSPAR) 5 MG tablet, Take 1 tablet (5 mg total) by mouth 2 (two) times daily as needed., Disp: 60 tablet, Rfl: 1 .  furosemide (LASIX) 20 MG tablet, Take 1 tablet (20 mg total) by mouth 3 (three) times a week., Disp: 10 tablet, Rfl: 5 .  levocetirizine (XYZAL) 5 MG tablet, Take 5 mg daily by mouth., Disp: , Rfl: 3 .  montelukast (SINGULAIR) 10 MG tablet,  TAKE 1 TABLET (10 MG TOTAL) BY MOUTH AT BEDTIME., Disp: 30 tablet, Rfl: 11 .  rizatriptan (MAXALT) 10 MG tablet, Take 1 tablet (10 mg total) by mouth as needed for migraine. May repeat in 2 hours if needed, Disp: 10 tablet, Rfl: 12 .  topiramate (TOPAMAX) 100 MG tablet, Take 1 tablet (100 mg total) by mouth daily., Disp: 30 tablet, Rfl: 5 .  albuterol (PROVENTIL HFA;VENTOLIN HFA) 108 (90 Base) MCG/ACT inhaler, Inhale 2 puffs into the lungs every 4 (four) hours as needed for wheezing or shortness of breath., Disp: 1 Inhaler, Rfl: 0 .  albuterol (PROVENTIL HFA;VENTOLIN HFA) 108 (90 Base) MCG/ACT inhaler, Inhale 1-2 puffs into the lungs every 6 (six) hours as needed for wheezing or shortness of breath., Disp: 1 Inhaler, Rfl: 0 .  chlorpheniramine-HYDROcodone (TUSSIONEX PENNKINETIC ER)  10-8 MG/5ML SUER, Take 5 mLs by mouth every 12 (twelve) hours as needed for cough., Disp: 120 mL, Rfl: 0 .  oseltamivir (TAMIFLU) 75 MG capsule, Take 1 capsule (75 mg total) by mouth 2 (two) times daily. X 5 days, Disp: 10 capsule, Rfl: 0 .  SUPREP BOWEL PREP KIT 17.5-3.13-1.6 GM/177ML SOLN, , Disp: , Rfl: 0  Allergies  Allergen Reactions  . Amoxicillin Other (See Comments)  . Clavulanic Acid Other (See Comments)  . Morphine And Related     vomiting     ROS  As noted in HPI.   Physical Exam  BP (!) 140/91 (BP Location: Right Arm)   Pulse 97   Temp (!) 103 F (39.4 C) (Oral)   Resp 16   SpO2 97%   Constitutional: Well developed, well nourished, no acute distress Eyes:  EOMI, conjunctiva normal bilaterally HENT: Normocephalic, atraumatic,mucus membranes moist. Mild nasal congestion..  No sinus tenderness.  TMs normal bilaterally.  Normal oropharynx. Neck: No cervical lymphadenopathy.  No meningismus. Respiratory: Normal inspiratory effort lungs clear bilaterally, good air movement. Cardiovascular: Normal rate and rhythm, no murmurs, rubs, gallops GI: nondistended skin: No rash, skin intact Musculoskeletal: no deformities Neurologic: Alert & oriented x 3, no focal neuro deficits Psychiatric: Speech and behavior appropriate   ED Course   Medications  acetaminophen (TYLENOL) tablet 1,000 mg (1,000 mg Oral Given 11/07/17 1648)  ibuprofen (ADVIL,MOTRIN) tablet 400 mg (400 mg Oral Given 11/07/17 1648)  ipratropium-albuterol (DUONEB) 0.5-2.5 (3) MG/3ML nebulizer solution 3 mL (3 mLs Nebulization Given 11/07/17 1653)    Orders Placed This Encounter  Procedures  . Rapid Influenza A&B Antigens ([facility] only)    Standing Status:   Standing    Number of Occurrences:   1  . Recheck vitals    30 min after medication    Standing Status:   Standing    Number of Occurrences:   1  . Droplet precaution    Standing Status:   Standing    Number of Occurrences:   1    Results for  orders placed or performed during the hospital encounter of 11/07/17 (from the past 24 hour(s))  Rapid Influenza A&B Antigens ([facility] only)     Status: Abnormal   Collection Time: 11/07/17  3:10 PM  Result Value Ref Range   Influenza A ([facility]) POSITIVE (A) NEGATIVE   Influenza B ([facility]) NEGATIVE NEGATIVE   No results found.  ED Clinical Impression  Influenza A  Mild asthma with acute exacerbation, unspecified whether persistent   ED Assessment/Plan  [place] Narcotic database reviewed for this patient, and feel that the risk/benefit ratio today is favorable for proceeding with a prescription  for controlled substance.  Last opiate prescription was February 2018 for tramadol.  Patient's lungs are clear, however she is reporting shortness of breath, dyspnea on exertion, wheezing and chest tightness so we will give a DuoNeb.  I do not hear any focal lung findings so imaging was deferred today.  Her influenza A is positive.  Will give Tylenol 1 g and 1 dose of ibuprofen 400 mg x 1.  Will reevaluate.  On reevaluation, patient states that she feels much better.  Lungs are still clear.  Plan to send home with Tamiflu, Tussionex, another albuterol inhaler.  She states that she has a spacer at home.  Follow-up with her primary care physician in several days, to the ER if she gets worse.  Discussed labs,  MDM, plan and followup with patient. Discussed sn/sx that should prompt return to the ED. patient agrees with plan.   Meds ordered this encounter  Medications  . acetaminophen (TYLENOL) tablet 1,000 mg  . ibuprofen (ADVIL,MOTRIN) tablet 400 mg  . ipratropium-albuterol (DUONEB) 0.5-2.5 (3) MG/3ML nebulizer solution 3 mL  . albuterol (PROVENTIL HFA;VENTOLIN HFA) 108 (90 Base) MCG/ACT inhaler    Sig: Inhale 2 puffs into the lungs every 4 (four) hours as needed for wheezing or shortness of breath.    Dispense:  1 Inhaler    Refill:  0  . oseltamivir (TAMIFLU) 75 MG capsule    Sig: Take 1 capsule (75 mg  total) by mouth 2 (two) times daily. X 5 days    Dispense:  10 capsule    Refill:  0  . chlorpheniramine-HYDROcodone (TUSSIONEX PENNKINETIC ER) 10-8 MG/5ML SUER    Sig: Take 5 mLs by mouth every 12 (twelve) hours as needed for cough.    Dispense:  120 mL    Refill:  0  . albuterol (PROVENTIL HFA;VENTOLIN HFA) 108 (90 Base) MCG/ACT inhaler    Sig: Inhale 1-2 puffs into the lungs every 6 (six) hours as needed for wheezing or shortness of breath.    Dispense:  1 Inhaler    Refill:  0    *This clinic note was created using [employer]. Therefore, there may be occasional mistakes despite careful proofreading.   ?   Melynda Ripple, MD 11/07/17 [phone]

## 2017-11-07 NOTE — Telephone Encounter (Signed)
Asking staff to correct the weight at last visit

## 2017-11-07 NOTE — Telephone Encounter (Signed)
This was addressed Routed to front staff to schedule appt Message was just not signed off  Routing History   Priority Sent On From To Message Type   03/30/2017 5:41 PM [name], Janit BernMelinda P, MD Limmie PatriciaGraham, Cassandra P Pt Advice Request   Myrtice LauthBrown, Rhonda B    Myatt, Melissa J    03/30/2017 1:40 PM Phineas SemenJohnson, Tiffany, CMA [name], Janit BernMelinda P, MD    03/30/2017 1:29 PM Mychart, Generic P [facility]   Created by   Mychart, Generic on 03/30/2017 01:29 PM

## 2017-11-09 NOTE — Telephone Encounter (Signed)
Already addressed

## 2017-11-10 ENCOUNTER — Ambulatory Visit
Admission: RE | Admit: 2017-11-10 | Discharge: 2017-11-10 | Disposition: A | Discharge status: Home / Self Care | Payer: No Typology Code available for payment source | Source: Ambulatory Visit | Attending: Family Medicine | Admitting: Family Medicine

## 2017-11-10 ENCOUNTER — Encounter: Payer: Self-pay | Admitting: Family Medicine

## 2017-11-10 ENCOUNTER — Telehealth: Payer: Self-pay | Admitting: Emergency Medicine

## 2017-11-10 DIAGNOSIS — R062 Wheezing: Secondary | ICD-10-CM

## 2017-11-10 DIAGNOSIS — J101 Influenza due to other identified influenza virus with other respiratory manifestations: Secondary | ICD-10-CM

## 2017-11-10 NOTE — Telephone Encounter (Signed)
Called to follow up after patient's recent visit. LM to call with any questions or concerns. 

## 2017-11-13 ENCOUNTER — Ambulatory Visit: Admit: 2017-11-13 | Payer: Self-pay | Admitting: Gastroenterology

## 2017-11-13 SURGERY — COLONOSCOPY WITH PROPOFOL
Anesthesia: General

## 2017-12-04 LAB — LIPID PANEL
Cholesterol: 283 mg/dL — ABNORMAL HIGH (ref <200)
HDL: 44 mg/dL — ABNORMAL LOW (ref >50)
LDL CHOLESTEROL (CALC): 201 mg/dL — AB
Non-HDL Cholesterol (Calc): 239 mg/dL (calc) — ABNORMAL HIGH (ref <130)
Total CHOL/HDL Ratio: 6.4 (calc) — ABNORMAL HIGH (ref <5.0)
Triglycerides: 204 mg/dL — ABNORMAL HIGH (ref <150)

## 2017-12-04 LAB — COMPLETE METABOLIC PANEL WITH GFR
AG Ratio: 1.8 (calc) (ref 1.0–2.5)
ALBUMIN MSPROF: 4.2 g/dL (ref 3.6–5.1)
ALKALINE PHOSPHATASE (APISO): 54 U/L (ref 33–130)
ALT: 15 U/L (ref 6–29)
AST: 14 U/L (ref 10–35)
BUN / CREAT RATIO: 19 (calc) (ref 6–22)
BUN: 24 mg/dL (ref 7–25)
CALCIUM: 9.4 mg/dL (ref 8.6–10.4)
CO2: 27 mmol/L (ref 20–32)
CREATININE: 1.27 mg/dL — AB (ref 0.50–1.05)
Chloride: 107 mmol/L (ref 98–110)
GFR, Est African American: 57 mL/min/{1.73_m2} — ABNORMAL LOW (ref >=60)
GFR, Est Non African American: 49 mL/min/{1.73_m2} — ABNORMAL LOW (ref >=60)
GLUCOSE: 109 mg/dL — AB (ref 65–99)
Globulin: 2.4 g/dL (calc) (ref 1.9–3.7)
Potassium: 4.2 mmol/L (ref 3.5–5.3)
Sodium: 139 mmol/L (ref 135–146)
Total Bilirubin: 0.5 mg/dL (ref 0.2–1.2)
Total Protein: 6.6 g/dL (ref 6.1–8.1)

## 2017-12-04 LAB — HEMOGLOBIN A1C
Hgb A1c MFr Bld: 5.8 % of total Hgb — ABNORMAL HIGH (ref <5.7)
Mean Plasma Glucose: 120 (calc)
eAG (mmol/L): 6.6 (calc)

## 2017-12-07 ENCOUNTER — Ambulatory Visit (INDEPENDENT_AMBULATORY_CARE_PROVIDER_SITE_OTHER): Payer: No Typology Code available for payment source | Admitting: Family Medicine

## 2017-12-07 ENCOUNTER — Encounter: Payer: Self-pay | Admitting: Family Medicine

## 2017-12-07 ENCOUNTER — Other Ambulatory Visit: Payer: Self-pay | Admitting: Family Medicine

## 2017-12-07 VITALS — BP 132/80 | HR 98 | Temp 98.4°F | Wt 216.6 lb

## 2017-12-07 DIAGNOSIS — J069 Acute upper respiratory infection, unspecified: Secondary | ICD-10-CM | POA: Diagnosis not present

## 2017-12-07 DIAGNOSIS — R5383 Other fatigue: Secondary | ICD-10-CM

## 2017-12-07 DIAGNOSIS — F419 Anxiety disorder, unspecified: Secondary | ICD-10-CM | POA: Diagnosis not present

## 2017-12-07 DIAGNOSIS — E7849 Other hyperlipidemia: Secondary | ICD-10-CM

## 2017-12-07 DIAGNOSIS — N183 Chronic kidney disease, stage 3 unspecified: Secondary | ICD-10-CM

## 2017-12-07 DIAGNOSIS — G4700 Insomnia, unspecified: Secondary | ICD-10-CM

## 2017-12-07 DIAGNOSIS — E559 Vitamin D deficiency, unspecified: Secondary | ICD-10-CM

## 2017-12-07 DIAGNOSIS — N182 Chronic kidney disease, stage 2 (mild): Secondary | ICD-10-CM

## 2017-12-07 DIAGNOSIS — E538 Deficiency of other specified B group vitamins: Secondary | ICD-10-CM

## 2017-12-07 DIAGNOSIS — R109 Unspecified abdominal pain: Secondary | ICD-10-CM

## 2017-12-07 MED ORDER — TRAZODONE HCL 50 MG PO TABS: 25.0000 mg | ORAL_TABLET | Freq: Every evening | ORAL | 3 refills | Status: DC | PRN

## 2017-12-07 MED ORDER — BUSPIRONE HCL 10 MG PO TABS: 10.0000 mg | ORAL_TABLET | Freq: Two times a day (BID) | ORAL | 1 refills | Status: DC | PRN

## 2017-12-07 MED ORDER — ATORVASTATIN CALCIUM 80 MG PO TABS: 80.0000 mg | ORAL_TABLET | Freq: Every day | ORAL | 1 refills | Status: DC

## 2017-12-07 NOTE — Assessment & Plan Note (Signed)
Refer to nephrologist; family hx of chronic kidney disease; will check urine and US; avoid NSAIDs; hydrated; avoid diuretics

## 2017-12-07 NOTE — Assessment & Plan Note (Signed)
Sleep hygiene discussed, avoidance of electronics for two hours before bed, etc.; start trazodone 50 mg at bedtime if needed; do not take with any other sedatives, tylenol PM, etc.

## 2017-12-07 NOTE — Patient Instructions (Signed)
Use the trazodone at night if needed for sleep Relaxation techniques Increase the buspar We'll get the ultrasound and refer you to a nephrologist If you have not heard anything from my staff in a week about any orders/referrals/studies from today, please contact us here to follow-up [phone]

## 2017-12-07 NOTE — Assessment & Plan Note (Signed)
Will increase the buspar from 5 mg BID to 10 mg BID; stress reduction

## 2017-12-07 NOTE — Progress Notes (Signed)
Increase statin Stop furosemide Recheck labs in 6-8 weeks

## 2017-12-07 NOTE — Assessment & Plan Note (Signed)
Check level today 

## 2017-12-07 NOTE — Assessment & Plan Note (Signed)
Increase the statin to max dose; recheck lipids in 6-8 weeks

## 2017-12-07 NOTE — Progress Notes (Signed)
BP 132/80 (BP Location: Left Arm, Patient Position: Sitting, Cuff Size: Large)   Pulse 98   Temp 98.4 F (36.9 C) (Oral)   Wt 216 lb 9.6 oz (98.2 kg)   SpO2 98%   BMI 34.96 kg/m    Subjective:    Patient ID: Emma Rogers, female    DOB: 12-24-1965, [age] y.o.   MRN: [id number]  HPI: Emma Rogers is a 52 y.o. female  Chief Complaint  Patient presents with  . Headache  . Dizziness    Pt states that this comes and goes   . Fatigue    Pt states that she is not sleeping well   . Back Pain    Pt states that she has pain where LT kidney is    HPI Patient is here for f/u She is tolerating the buspar well and it is helping the anxiety; however, she would like to increase the dose  High cholesterol; reviewed her readings with her; just a few missed doses  She stopped the furosemide last time; she has stopped all caffeine, all anit-inflammatories  She has been having pain on the left kidney; not every day but enough to bother her; urine has been pretty dark, almost orange in color Pain in the left flank has been going on for months; thought it was more musculoskeletal; progressing and more in the kidney area Mother has a kidney that is smaller than the other kidney, but not caused her any issues; they did a scan because of a possible kidney stone, just noticed that way; maternal grandfather had CKD; he did not end on dialysis; nonsmoker She was looking up diets and stuff; not drinking orange juice b/c potassium She had the flu, went to urgent care; they gave her 800 mg ibuprofen Weight has dropped; appetite has declined; just not as hungry Prediabetes; last A1c did come down Vitamin B12; not taking supplements; feeling fatigue Vitamin D deficiency; not taking supplements She is not sleeping well; reading [employer] and not sleeping well; trouble not sleeping, though maybe menopause; not sure why; father struggles sleeping; may run in the family; father has a lot of parallel things;  advil pm helps him then gets insomnia, and she does the same thing; father has pacemaker; tries to go to bed Takes about an hour to get to sleep; quit looking at her phone; tried to start reading, doing a devotion; then wakes up every 1-2 hours, throughout the night; tried tylenol PM Not sure if alleriges or sinuses bothering her; little scratchy throat; drainage, no fever; blowing out stuff, tiny touch of color; ears are a little stopped up; no lumps under neck  [facility] 12/07/2017 10/09/2017 09/03/2017 08/04/2017 03/31/2017  Decreased Interest 0 1 0 1 0  Down, Depressed, Hopeless 0 1 0 1 0  PHQ - 2 Score 0 2 0 2 0  Altered sleeping - 3 - 1 -  Tired, decreased energy - 3 - 1 -  Change in appetite - 1 - 0 -  Feeling bad or failure about yourself  - 1 - 1 -  Trouble concentrating - 2 - 1 -  Moving slowly or fidgety/restless - 0 - 0 -  Suicidal thoughts - 0 - 0 -  PHQ-9 Score - 12 - 6 -  Difficult doing work/chores - Very difficult - - -    Relevant past medical, surgical, family and social history reviewed Past Medical History:  Diagnosis Date  . Allergic rhinitis   .  Asthma   . Depression   . ZOXWRUEA(540.9)    Past Surgical History:  Procedure Laterality Date  . ABDOMINAL HYSTERECTOMY  2009   still has 1 ovary  . BREAST BIOPSY Left 09/30/2002   Ductogram-neg  . INSERT / REPLACE / REMOVE PACEMAKER     Permanent pacemaker, Dr. [name]  . Lobe hemithroidectomy     Right   Family History  Problem Relation Age of Onset  . Heart disease Father        pacemaker,arrythmias  . Asthma Other   . Coronary artery disease Other        Female 1st degree relative <50  . Lung cancer Other   . Prostate cancer Other        1st degree relative <50  . COPD Maternal Grandmother   . Breast cancer Maternal Grandmother   . Heart disease Maternal Grandfather   . Liver disease Paternal Grandmother   . Heart disease Paternal Grandfather    Social History   Tobacco Use  .  Smoking status: Never Smoker  . Smokeless tobacco: Never Used  Substance Use Topics  . Alcohol use: No    Alcohol/week: 0.0 oz  . Drug use: No    Interim medical history since last visit reviewed. Allergies and medications reviewed  Review of Systems Per HPI unless specifically indicated above     Objective:    BP 132/80 (BP Location: Left Arm, Patient Position: Sitting, Cuff Size: Large)   Pulse 98   Temp 98.4 F (36.9 C) (Oral)   Wt 216 lb 9.6 oz (98.2 kg)   SpO2 98%   BMI 34.96 kg/m   Wt Readings from Last 3 Encounters:  12/07/17 216 lb 9.6 oz (98.2 kg)  10/19/17 219 lb 8 oz (99.6 kg)  10/09/17 212 lb 8 oz (96.4 kg)    Physical Exam  Constitutional: She appears well-developed and well-nourished. No distress.  HENT:  Head: Normocephalic and atraumatic.  Right Ear: Tympanic membrane and ear canal normal.  Left Ear: Tympanic membrane and ear canal normal.  Nose: Rhinorrhea (cloudy) present. Right sinus exhibits maxillary sinus tenderness and frontal sinus tenderness. Left sinus exhibits no maxillary sinus tenderness and no frontal sinus tenderness.  Mouth/Throat: No posterior oropharyngeal edema or posterior oropharyngeal erythema.  Eyes: EOM are normal. No scleral icterus.  Neck: No thyromegaly present.  Cardiovascular: Normal rate, regular rhythm and normal heart sounds.  No murmur heard. Pulmonary/Chest: Effort normal and breath sounds normal. No respiratory distress. She has no wheezes.  Abdominal: Soft. Bowel sounds are normal. She exhibits no distension. There is no tenderness. There is CVA tenderness (equivocal LEFT side).  Musculoskeletal: Normal range of motion. She exhibits no edema.  Lymphadenopathy:    She has no cervical adenopathy.  Neurological: She is alert. She exhibits normal muscle tone.  Skin: Skin is warm and dry. She is not diaphoretic. No pallor.  Psychiatric: She has a normal mood and affect. Her behavior is normal. Judgment and thought content  normal.  Good eye contact with examiner    Results for orders placed or performed during the hospital encounter of 11/07/17  Rapid Influenza A&B Antigens ([facility] only)  Result Value Ref Range   Influenza A ([facility]) POSITIVE (A) NEGATIVE   Influenza B ([facility]) NEGATIVE NEGATIVE      Assessment & Plan:   Problem List Items Addressed This Visit      Genitourinary   Chronic kidney disease, stage III (moderate) (HCC)    Refer to nephrologist;  family hx of chronic kidney disease; will check urine and [place]; avoid NSAIDs; hydrated; avoid diuretics      Relevant Orders   Ambulatory referral to Nephrology   US Renal   Urinalysis w microscopic + reflex cultur     Other   Fatigue   Relevant Orders   CBC with Differential/Platelet   Vitamin D deficiency    Check level today      Relevant Orders   VITAMIN D 25 Hydroxy (Vit-D Deficiency, Fractures)   Vitamin B12 deficiency    Check level today      Relevant Orders   B12   Insomnia    Sleep hygiene discussed, avoidance of electronics for two hours before bed, etc.; start trazodone 50 mg at bedtime if needed; do not take with any other sedatives, tylenol PM, etc.      Familial hyperlipidemia    Increase the statin to max dose; recheck lipids in 6-8 weeks      Anxiety    Will increase the buspar from 5 mg BID to 10 mg BID; stress reduction      Relevant Medications   busPIRone (BUSPAR) 10 MG tablet   traZODone (DESYREL) 50 MG tablet    Other Visit Diagnoses    Left flank pain    -  Primary   will get [place], urinalysis (dip and micro)   Relevant Orders   Ambulatory referral to Nephrology   US Renal   Urinalysis w microscopic + reflex cultur   Viral upper respiratory tract infection       explained not going to prescribe antibiotics at this time; try nasal saline; if persistent unilateral sx for 10 days, can call for ABX       Follow up plan: Return in about 16 days (around 12/23/2017) for follow-up visit with Dr. Sherie Don.  An  after-visit summary was printed and given to the patient at check-out.  Please see the patient instructions which may contain other information and recommendations beyond what is mentioned above in the assessment and plan.  Meds ordered this encounter  Medications  . busPIRone (BUSPAR) 10 MG tablet    Sig: Take 1 tablet (10 mg total) by mouth 2 (two) times daily as needed.    Dispense:  180 tablet    Refill:  1    Yes, I'm increasing the dose -- thank you and have a lovely day  . traZODone (DESYREL) 50 MG tablet    Sig: Take 0.5-1 tablets (25-50 mg total) by mouth at bedtime as needed for sleep.    Dispense:  30 tablet    Refill:  3    Orders Placed This Encounter  Procedures  . US Renal  . Urinalysis w microscopic + reflex cultur  . CBC with Differential/Platelet  . VITAMIN D 25 Hydroxy (Vit-D Deficiency, Fractures)  . B12  . Ambulatory referral to Nephrology

## 2017-12-08 ENCOUNTER — Other Ambulatory Visit: Payer: Self-pay

## 2017-12-08 DIAGNOSIS — J069 Acute upper respiratory infection, unspecified: Secondary | ICD-10-CM

## 2017-12-08 DIAGNOSIS — E559 Vitamin D deficiency, unspecified: Secondary | ICD-10-CM

## 2017-12-08 DIAGNOSIS — R109 Unspecified abdominal pain: Secondary | ICD-10-CM

## 2017-12-08 DIAGNOSIS — N183 Chronic kidney disease, stage 3 unspecified: Secondary | ICD-10-CM

## 2017-12-08 DIAGNOSIS — E538 Deficiency of other specified B group vitamins: Secondary | ICD-10-CM

## 2017-12-08 DIAGNOSIS — R5383 Other fatigue: Secondary | ICD-10-CM

## 2017-12-08 NOTE — Addendum Note (Signed)
Addended by: Cynda FamiliaJOHNSON, Keshan Reha L on: 12/08/2017 02:56 PM   Modules accepted: Orders

## 2017-12-09 ENCOUNTER — Other Ambulatory Visit
Admission: RE | Admit: 2017-12-09 | Discharge: 2017-12-09 | Disposition: A | Discharge status: Home / Self Care | Payer: No Typology Code available for payment source | Source: Ambulatory Visit | Attending: Family Medicine | Admitting: Family Medicine

## 2017-12-09 DIAGNOSIS — E7849 Other hyperlipidemia: Secondary | ICD-10-CM | POA: Diagnosis not present

## 2017-12-09 DIAGNOSIS — N182 Chronic kidney disease, stage 2 (mild): Secondary | ICD-10-CM | POA: Diagnosis present

## 2017-12-09 LAB — CBC WITH DIFFERENTIAL/PLATELET
BASOS PCT: 1 %
Basophils Absolute: 0 10*3/uL (ref 0–0.1)
EOS ABS: 0.3 10*3/uL (ref 0–0.7)
EOS PCT: 5 %
HCT: 40.5 % (ref 35.0–47.0)
HEMOGLOBIN: 13.6 g/dL (ref 12.0–16.0)
Lymphocytes Relative: 27 %
Lymphs Abs: 1.8 10*3/uL (ref 1.0–3.6)
MCH: 31 pg (ref 26.0–34.0)
MCHC: 33.5 g/dL (ref 32.0–36.0)
MCV: 92.3 fL (ref 80.0–100.0)
MONOS PCT: 7 %
Monocytes Absolute: 0.5 10*3/uL (ref 0.2–0.9)
NEUTROS PCT: 60 %
Neutro Abs: 4.2 10*3/uL (ref 1.4–6.5)
PLATELETS: 227 10*3/uL (ref 150–440)
RBC: 4.39 MIL/uL (ref 3.80–5.20)
RDW: 13.8 % (ref 11.5–14.5)
WBC: 6.9 10*3/uL (ref 3.6–11.0)

## 2017-12-09 LAB — URINALYSIS, COMPLETE (UACMP) WITH MICROSCOPIC
BACTERIA UA: NONE SEEN
Bilirubin Urine: NEGATIVE
Glucose, UA: NEGATIVE mg/dL
HGB URINE DIPSTICK: NEGATIVE
KETONES UR: NEGATIVE mg/dL
Nitrite: NEGATIVE
PROTEIN: NEGATIVE mg/dL
RBC / HPF: NONE SEEN RBC/hpf (ref 0–5)
Specific Gravity, Urine: 1.028 (ref 1.005–1.030)
pH: 5 (ref 5.0–8.0)

## 2017-12-09 LAB — VITAMIN B12: VITAMIN B 12: 220 pg/mL (ref 180–914)

## 2017-12-10 ENCOUNTER — Other Ambulatory Visit: Payer: Self-pay | Admitting: Family Medicine

## 2017-12-10 ENCOUNTER — Encounter: Payer: Self-pay | Admitting: Family Medicine

## 2017-12-10 DIAGNOSIS — E538 Deficiency of other specified B group vitamins: Secondary | ICD-10-CM

## 2017-12-10 LAB — VITAMIN D 25 HYDROXY (VIT D DEFICIENCY, FRACTURES): Vit D, 25-Hydroxy: 20.8 ng/mL — ABNORMAL LOW (ref 30.0–100.0)

## 2017-12-10 MED ORDER — DOXYCYCLINE HYCLATE 100 MG PO TABS: 100.0000 mg | ORAL_TABLET | Freq: Two times a day (BID) | ORAL | 0 refills | Status: DC

## 2017-12-10 MED ORDER — VITAMIN D (ERGOCALCIFEROL) 1.25 MG (50000 UNIT) PO CAPS: 50000.0000 [IU] | ORAL_CAPSULE | ORAL | 1 refills | Status: AC

## 2017-12-10 NOTE — Progress Notes (Signed)
Start vit D Rx Okay for either sublingual B12 or monthly vitamin B12 1000 mcg IM

## 2017-12-10 NOTE — Assessment & Plan Note (Signed)
Sublingual 500 or 1000 mcg daily OR vitamin B12 1000 mcg monthly

## 2017-12-14 ENCOUNTER — Ambulatory Visit
Admission: RE | Admit: 2017-12-14 | Discharge: 2017-12-14 | Disposition: A | Discharge status: Home / Self Care | Payer: No Typology Code available for payment source | Source: Ambulatory Visit | Attending: Family Medicine | Admitting: Family Medicine

## 2017-12-14 DIAGNOSIS — N183 Chronic kidney disease, stage 3 unspecified: Secondary | ICD-10-CM

## 2017-12-14 DIAGNOSIS — R109 Unspecified abdominal pain: Secondary | ICD-10-CM | POA: Insufficient documentation

## 2017-12-15 ENCOUNTER — Telehealth: Payer: Self-pay | Admitting: Family Medicine

## 2017-12-15 MED ORDER — BUPROPION HCL ER (XL) 150 MG PO TB24: 300.0000 mg | ORAL_TABLET | Freq: Every day | ORAL | 0 refills | Status: DC

## 2017-12-15 NOTE — Telephone Encounter (Signed)
2 x 150 mg wellbutrin

## 2017-12-15 NOTE — Telephone Encounter (Signed)
Copied from CRM [phone]. Topic: Quick Communication - Rx Refill/Question >> Dec 15, 2017 10:49 AM Sherrie GeorgeFoltz, Melissa J wrote: Medication: buPROPion (WELLBUTRIN XL) 300 MG 24 hr tablet med on long term back order  Has the patient contacted their pharmacy? Yes.  Pharm called  (Agent: If no, request that the patient contact the pharmacy for the refill.) Preferred Pharmacy (with phone number or street name): [facility] - [phone]   Agent: Please be advised that RX refills may take up to 3 business days. We ask that you follow-up with your pharmacy.

## 2017-12-15 NOTE — Telephone Encounter (Signed)
Please see below and advise. Thanks

## 2017-12-18 ENCOUNTER — Ambulatory Visit (INDEPENDENT_AMBULATORY_CARE_PROVIDER_SITE_OTHER): Payer: No Typology Code available for payment source

## 2017-12-18 DIAGNOSIS — E538 Deficiency of other specified B group vitamins: Secondary | ICD-10-CM

## 2017-12-18 MED ORDER — CYANOCOBALAMIN 1000 MCG/ML IJ SOLN
1000.0000 ug | Freq: Once | INTRAMUSCULAR | Status: AC
  Administered 2017-12-18: 1000 ug via INTRAMUSCULAR

## 2018-01-04 ENCOUNTER — Ambulatory Visit (INDEPENDENT_AMBULATORY_CARE_PROVIDER_SITE_OTHER): Payer: No Typology Code available for payment source | Admitting: Family Medicine

## 2018-01-04 ENCOUNTER — Encounter: Payer: Self-pay | Admitting: Family Medicine

## 2018-01-04 VITALS — BP 116/82 | HR 93 | Temp 98.2°F | Resp 14 | Ht 66.0 in | Wt 214.0 lb

## 2018-01-04 DIAGNOSIS — R140 Abdominal distension (gaseous): Secondary | ICD-10-CM

## 2018-01-04 DIAGNOSIS — E785 Hyperlipidemia, unspecified: Secondary | ICD-10-CM | POA: Diagnosis not present

## 2018-01-04 DIAGNOSIS — E559 Vitamin D deficiency, unspecified: Secondary | ICD-10-CM

## 2018-01-04 DIAGNOSIS — Z950 Presence of cardiac pacemaker: Secondary | ICD-10-CM

## 2018-01-04 DIAGNOSIS — R6889 Other general symptoms and signs: Secondary | ICD-10-CM

## 2018-01-04 DIAGNOSIS — R102 Pelvic and perineal pain: Secondary | ICD-10-CM

## 2018-01-04 DIAGNOSIS — R5383 Other fatigue: Secondary | ICD-10-CM

## 2018-01-04 DIAGNOSIS — F419 Anxiety disorder, unspecified: Secondary | ICD-10-CM

## 2018-01-04 DIAGNOSIS — R109 Unspecified abdominal pain: Secondary | ICD-10-CM | POA: Diagnosis not present

## 2018-01-04 DIAGNOSIS — E538 Deficiency of other specified B group vitamins: Secondary | ICD-10-CM

## 2018-01-04 LAB — POCT URINALYSIS DIPSTICK
BILIRUBIN UA: NEGATIVE
GLUCOSE UA: NEGATIVE
Ketones, UA: NEGATIVE
Leukocytes, UA: NEGATIVE
Nitrite, UA: NEGATIVE
Odor: NORMAL
PH UA: 5 (ref 5.0–8.0)
Protein, UA: NEGATIVE
RBC UA: NEGATIVE
SPEC GRAV UA: 1.01 (ref 1.010–1.025)
UROBILINOGEN UA: 0.2 U/dL

## 2018-01-04 NOTE — Assessment & Plan Note (Signed)
?  multifactorial 

## 2018-01-04 NOTE — Assessment & Plan Note (Signed)
Buspirone is working well

## 2018-01-04 NOTE — Patient Instructions (Addendum)
Please call [phone] to schedule your imaging tests Please wait 2-3 days after the order has been placed to call and get your test scheduled Return monthly for vitamin B12 shots; last of April or first of May Let's get the scan and have you see the kidney doctor and call with any updates

## 2018-01-04 NOTE — Assessment & Plan Note (Signed)
She'll try to remember to take monthly

## 2018-01-04 NOTE — Assessment & Plan Note (Signed)
Monthly B12 injections. 

## 2018-01-04 NOTE — Progress Notes (Signed)
BP 116/82   Pulse 93   Temp 98.2 F (36.8 C) (Oral)   Resp 14   Ht 5\' 6"  (1.676 m)   Wt 214 lb (97.1 kg)   SpO2 95%   BMI 34.54 kg/m    Subjective:    Patient ID: Emma Rogers, female    DOB: 1966/07/12, 52 y.o.   MRN: [id number]  HPI: Emma Rogers is a 52 y.o. female  Chief Complaint  Patient presents with  . Follow-up  . Flank Pain    right side, worse when she needs to urinate  MD note: LEFT side, not right  HPI Patient is here for f/u, but she is having flank pain; LEFT side confirmed; worse with urination; same as before; she points along the lower rib cage, kind of comes around from the back; has the US done 12/14/17; seeing the kidney doctor on 01/13/18 Chronic; off and on; she has been thinking it would just get better; she is one of these people who will just wait it out, but it's not going away; she is also having some pressure and aching in the left side of the pelvis too; not all the time; s/p hysterectomy; still has one ovary; some bloating  She was seen on 12/07/17 and was having left flank pain at that time  Tolerating buspar for anxiety; doing well  High cholesterol; increased statin; will be due for recheck of lipids 6-8 weeks after the adjustment  Off of furosemide now; can't remember last echo; sees cardiologist for her pacemaker; feeling fatigue when working in the yard; gets winded sometimes, thinks some of that might be allergies; she gets edema around her legs, just above ankles, has to drink enough water, feet do swell towards the end of the day; mostly sitting during the day at work  Not sleeping well; was prescribed trazodone for this  Vitamin D deficiency; hard time remembering to take once a week  Vitamin B12 was only 220; getting the shots; could tell a little difference after the shot on 12/18/17; some fatigue; pacing herself in the yard  Depression screen [facility] [place] 2/9 01/04/2018 12/07/2017 10/09/2017 09/03/2017 08/04/2017  Decreased Interest 0 0  1 0 1  Down, Depressed, Hopeless 0 0 1 0 1  PHQ - 2 Score 0 0 2 0 2  Altered sleeping - - 3 - 1  Tired, decreased energy - - 3 - 1  Change in appetite - - 1 - 0  Feeling bad or failure about yourself  - - 1 - 1  Trouble concentrating - - 2 - 1  Moving slowly or fidgety/restless - - 0 - 0  Suicidal thoughts - - 0 - 0  PHQ-9 Score - - 12 - 6  Difficult doing work/chores - - Very difficult - -    Relevant past medical, surgical, family and social history reviewed Past Medical History:  Diagnosis Date  . Allergic rhinitis   . Asthma   . Depression   . WUJWJXBJ(478.2)    Past Surgical History:  Procedure Laterality Date  . ABDOMINAL HYSTERECTOMY  2009   still has 1 ovary  . BREAST BIOPSY Left 09/30/2002   Ductogram-neg  . INSERT / REPLACE / REMOVE PACEMAKER     Permanent pacemaker, Dr. [name]  . Lobe hemithroidectomy     Right   Family History  Problem Relation Age of Onset  . Heart disease Father        pacemaker,arrythmias  . Asthma Other   .  Coronary artery disease Other        Female 1st degree relative <50  . Lung cancer Other   . Prostate cancer Other        1st degree relative <50  . COPD Maternal Grandmother   . Breast cancer Maternal Grandmother   . Heart disease Maternal Grandfather   . Liver disease Paternal Grandmother   . Heart disease Paternal Grandfather    Social History   Tobacco Use  . Smoking status: Never Smoker  . Smokeless tobacco: Never Used  Substance Use Topics  . Alcohol use: No    Alcohol/week: 0.0 oz  . Drug use: No    Interim medical history since last visit reviewed. Allergies and medications reviewed  Review of Systems Per HPI unless specifically indicated above     Objective:    BP 116/82   Pulse 93   Temp 98.2 F (36.8 C) (Oral)   Resp 14   Ht 5\' 6"  (1.676 m)   Wt 214 lb (97.1 kg)   SpO2 95%   BMI 34.54 kg/m   Wt Readings from Last 3 Encounters:  01/04/18 214 lb (97.1 kg)  12/07/17 216 lb 9.6 oz (98.2 kg)    10/19/17 219 lb 8 oz (99.6 kg)    Physical Exam  Constitutional: She appears well-developed and well-nourished. No distress.  HENT:  Head: Normocephalic and atraumatic.  Eyes: EOM are normal. No scleral icterus.  Neck: No thyromegaly present.  Cardiovascular: Normal rate, regular rhythm and normal heart sounds.  No murmur heard. Pulmonary/Chest: Effort normal and breath sounds normal. No respiratory distress. She has no wheezes.  Abdominal: Soft. Bowel sounds are normal. She exhibits no distension. There is tenderness in the left upper quadrant.    Discomfort with deeper palpation along left colic gutter, left flank  Musculoskeletal: She exhibits no edema.  Neurological: She is alert. She exhibits normal muscle tone.  Skin: Skin is warm and dry. She is not diaphoretic. No pallor.  Psychiatric: She has a normal mood and affect. Her behavior is normal. Judgment and thought content normal. Her mood appears not anxious. She does not exhibit a depressed mood.    Results for orders placed or performed in visit on 01/04/18  POCT urinalysis dipstick  Result Value Ref Range   Color, UA yellow    Clarity, UA clear    Glucose, UA neg    Bilirubin, UA neg    Ketones, UA neg    Spec Grav, UA 1.010 1.010 - 1.025   Blood, UA neg    pH, UA 5.0 5.0 - 8.0   Protein, UA neg    Urobilinogen, UA 0.2 0.2 or 1.0 E.U./dL   Nitrite, UA neg    Leukocytes, UA Negative Negative   Appearance clear    Odor normal       Assessment & Plan:   Problem List Items Addressed This Visit      Other   PPM-[employer]   Relevant Orders   ECHOCARDIOGRAM COMPLETE   Vitamin D deficiency    She'll try to remember to take monthly      Vitamin B12 deficiency    Monthly B12 injections      Fatigue    multifactorial      Relevant Orders   ECHOCARDIOGRAM COMPLETE   Anxiety    Buspirone is working well       Other Visit Diagnoses    Flank pain    -  Primary   Relevant Orders  POCT urinalysis  dipstick (Completed)   CT Abdomen Pelvis W Contrast   Pelvic pain       Relevant Orders   CT Abdomen Pelvis W Contrast   Abdominal distension (gaseous)       Relevant Orders   CT Abdomen Pelvis W Contrast   Bloating       Relevant Orders   CT Abdomen Pelvis W Contrast   Dyslipidemia       Relevant Orders   Lipid panel   Decreased exercise tolerance       Relevant Orders   ECHOCARDIOGRAM COMPLETE       Follow up plan: Return in about 2 weeks (around 01/18/2018) for B12 injection with Asher Muir or [place].  An after-visit summary was printed and given to the patient at check-out.  Please see the patient instructions which may contain other information and recommendations beyond what is mentioned above in the assessment and plan.  No orders of the defined types were placed in this encounter.   Orders Placed This Encounter  Procedures  . CT Abdomen Pelvis W Contrast  . Lipid panel  . POCT urinalysis dipstick  . ECHOCARDIOGRAM COMPLETE

## 2018-01-11 ENCOUNTER — Ambulatory Visit
Admission: RE | Admit: 2018-01-11 | Discharge: 2018-01-11 | Disposition: A | Discharge status: Home / Self Care | Payer: No Typology Code available for payment source | Source: Ambulatory Visit | Attending: Family Medicine | Admitting: Family Medicine

## 2018-01-11 DIAGNOSIS — J45909 Unspecified asthma, uncomplicated: Secondary | ICD-10-CM | POA: Diagnosis not present

## 2018-01-11 DIAGNOSIS — R5383 Other fatigue: Secondary | ICD-10-CM | POA: Diagnosis present

## 2018-01-11 DIAGNOSIS — Z950 Presence of cardiac pacemaker: Secondary | ICD-10-CM | POA: Insufficient documentation

## 2018-01-11 DIAGNOSIS — Z853 Personal history of malignant neoplasm of breast: Secondary | ICD-10-CM | POA: Diagnosis not present

## 2018-01-11 DIAGNOSIS — R6889 Other general symptoms and signs: Secondary | ICD-10-CM | POA: Diagnosis present

## 2018-01-11 NOTE — Progress Notes (Signed)
*  PRELIMINARY RESULTS* Echocardiogram 2D Echocardiogram has been performed.  Cristela BlueHege, Storm Dulski 01/11/2018, 11:45 AM

## 2018-01-12 ENCOUNTER — Ambulatory Visit
Admission: RE | Admit: 2018-01-12 | Discharge: 2018-01-12 | Disposition: A | Discharge status: Home / Self Care | Payer: No Typology Code available for payment source | Source: Ambulatory Visit | Attending: Family Medicine | Admitting: Family Medicine

## 2018-01-12 DIAGNOSIS — R109 Unspecified abdominal pain: Secondary | ICD-10-CM | POA: Insufficient documentation

## 2018-01-12 DIAGNOSIS — R140 Abdominal distension (gaseous): Secondary | ICD-10-CM | POA: Insufficient documentation

## 2018-01-12 DIAGNOSIS — R102 Pelvic and perineal pain: Secondary | ICD-10-CM | POA: Insufficient documentation

## 2018-01-12 HISTORY — DX: Disorder of kidney and ureter, unspecified: N28.9

## 2018-01-12 MED ORDER — IOHEXOL 300 MG/ML  SOLN
85.0000 mL | Freq: Once | INTRAMUSCULAR | Status: AC | PRN
  Administered 2018-01-12: 85 mL via INTRAVENOUS

## 2018-01-19 ENCOUNTER — Ambulatory Visit (INDEPENDENT_AMBULATORY_CARE_PROVIDER_SITE_OTHER): Payer: No Typology Code available for payment source

## 2018-01-19 DIAGNOSIS — E538 Deficiency of other specified B group vitamins: Secondary | ICD-10-CM

## 2018-01-19 MED ORDER — CYANOCOBALAMIN 1000 MCG/ML IJ SOLN
1000.0000 ug | INTRAMUSCULAR | Status: DC
  Administered 2018-01-19: 1000 ug via INTRAMUSCULAR

## 2018-01-19 NOTE — Progress Notes (Signed)
Patient came in for her B-12 injection. She tolerated it well. NKDA.   

## 2018-02-03 ENCOUNTER — Encounter: Payer: Self-pay | Admitting: Family Medicine

## 2018-02-03 DIAGNOSIS — R600 Localized edema: Secondary | ICD-10-CM

## 2018-02-03 DIAGNOSIS — Z5181 Encounter for therapeutic drug level monitoring: Secondary | ICD-10-CM

## 2018-02-03 MED ORDER — TRAZODONE HCL 50 MG PO TABS: 100.0000 mg | ORAL_TABLET | Freq: Every evening | ORAL | 3 refills | Status: DC | PRN

## 2018-02-03 NOTE — Telephone Encounter (Signed)
I talked with patient last night at [facility] She has had some puffiness in her legs; would like kidney labs checked Trouble sleeping; will increase trazodone to 100 mg or even up to 150 mg if needed; drug holiday once a week may help other nights work better; consider sleep referral if not improving

## 2018-02-04 ENCOUNTER — Other Ambulatory Visit: Payer: Self-pay

## 2018-02-04 DIAGNOSIS — E785 Hyperlipidemia, unspecified: Secondary | ICD-10-CM

## 2018-02-04 DIAGNOSIS — Z5181 Encounter for therapeutic drug level monitoring: Secondary | ICD-10-CM

## 2018-02-08 ENCOUNTER — Other Ambulatory Visit: Payer: Self-pay | Admitting: Family Medicine

## 2018-02-08 DIAGNOSIS — E7849 Other hyperlipidemia: Secondary | ICD-10-CM

## 2018-02-08 MED ORDER — EZETIMIBE 10 MG PO TABS
10.0000 mg | ORAL_TABLET | Freq: Every day | ORAL | 3 refills | Status: DC
Start: 2018-02-08 — End: 2018-04-26

## 2018-02-08 NOTE — Progress Notes (Signed)
zetia + statin; recheck lipids in 3 months

## 2018-02-08 NOTE — Addendum Note (Signed)
Addended by: Arless Vineyard, [name] on: 02/08/2018 11:22 AM   Modules accepted: Orders

## 2018-02-11 LAB — LIPID PANEL
CHOL/HDL RATIO: 5 (calc) — AB (ref <5.0)
CHOLESTEROL: 254 mg/dL — AB (ref <200)
HDL: 51 mg/dL (ref >50)
LDL Cholesterol (Calc): 173 mg/dL (calc) — ABNORMAL HIGH
NON-HDL CHOLESTEROL (CALC): 203 mg/dL — AB (ref <130)
TRIGLYCERIDES: 156 mg/dL — AB (ref <150)

## 2018-02-11 LAB — COMPLETE METABOLIC PANEL WITH GFR
AG Ratio: 1.8 (calc) (ref 1.0–2.5)
ALKALINE PHOSPHATASE (APISO): 51 U/L (ref 33–130)
ALT: 17 U/L (ref 6–29)
AST: 16 U/L (ref 10–35)
Albumin: 4.2 g/dL (ref 3.6–5.1)
BUN/Creatinine Ratio: 22 (calc) (ref 6–22)
BUN: 24 mg/dL (ref 7–25)
CO2: 26 mmol/L (ref 20–32)
CREATININE: 1.11 mg/dL — AB (ref 0.50–1.05)
Calcium: 9.1 mg/dL (ref 8.6–10.4)
Chloride: 108 mmol/L (ref 98–110)
GFR, Est African American: 67 mL/min/{1.73_m2} (ref >=60)
GFR, Est Non African American: 58 mL/min/{1.73_m2} — ABNORMAL LOW (ref >=60)
Globulin: 2.4 g/dL (calc) (ref 1.9–3.7)
Glucose, Bld: 106 mg/dL — ABNORMAL HIGH (ref 65–99)
POTASSIUM: 4.1 mmol/L (ref 3.5–5.3)
SODIUM: 139 mmol/L (ref 135–146)
Total Bilirubin: 0.6 mg/dL (ref 0.2–1.2)
Total Protein: 6.6 g/dL (ref 6.1–8.1)

## 2018-02-17 ENCOUNTER — Other Ambulatory Visit: Payer: Self-pay | Admitting: Family Medicine

## 2018-02-18 ENCOUNTER — Ambulatory Visit: Payer: No Typology Code available for payment source

## 2018-02-18 ENCOUNTER — Encounter (INDEPENDENT_AMBULATORY_CARE_PROVIDER_SITE_OTHER): Payer: Self-pay | Admitting: Vascular Surgery

## 2018-02-18 ENCOUNTER — Ambulatory Visit (INDEPENDENT_AMBULATORY_CARE_PROVIDER_SITE_OTHER): Payer: No Typology Code available for payment source | Admitting: Vascular Surgery

## 2018-02-18 VITALS — BP 110/82 | HR 73 | Resp 16 | Ht 66.0 in | Wt 218.0 lb

## 2018-02-18 DIAGNOSIS — N183 Chronic kidney disease, stage 3 unspecified: Secondary | ICD-10-CM

## 2018-02-18 DIAGNOSIS — R600 Localized edema: Secondary | ICD-10-CM

## 2018-02-18 NOTE — Progress Notes (Signed)
Subjective:    Patient ID: Emma Rogers, female    DOB: Feb 25, 1966, 52 y.o.   MRN: [id number] Chief Complaint  Patient presents with  . New Patient (Initial Visit)    Bilateral Edema   Presents as a new patient referred by Dr. Luna Glasgow for evaluation of bilateral lower extremity edema and discomfort.  The patient endorses a long-standing history of experiencing swelling to the bilateral legs however her discomfort has progressively worsened over the last few weeks.  The patient does experience a tingling and burning especially to the left foot most lead towards the end of the day when her edema is worse.  The patient denies any claudication-like symptoms, rest pain or ulceration to the bilateral lower extremity.  The patient denies any recent surgery or trauma to the bilateral lower extremity.  At this time, the patient does not engage in conservative therapy including wearing medical grade 1 compression socks or elevating her legs.  The patient does note that her symptoms have progressed to the point that she is unable to function on a daily basis and feels that they have become lifestyle limiting.  Patient denies any fever, nausea vomiting.  Review of Systems  Constitutional: Negative.   HENT: Negative.   Eyes: Negative.   Respiratory: Negative.   Cardiovascular: Positive for leg swelling.  Gastrointestinal: Negative.   Endocrine: Negative.   Genitourinary: Negative.   Musculoskeletal: Negative.   Skin: Negative.   Allergic/Immunologic: Negative.   Neurological: Negative.   Hematological: Negative.   Psychiatric/Behavioral: Negative.       Objective:   Physical Exam  Constitutional: She is oriented to person, place, and time. She appears well-developed and well-nourished. No distress.  HENT:  Head: Normocephalic and atraumatic.  Right Ear: External ear normal.  Left Ear: External ear normal.  Eyes: Pupils are equal, round, and reactive to light. Conjunctivae and EOM are normal.    Neck: Normal range of motion.  Cardiovascular: Normal rate, regular rhythm, normal heart sounds and intact distal pulses.  Pulses:      Radial pulses are 2+ on the right side, and 2+ on the left side.  Hard to palpate pedal pulses due to body habitus and edema however the bilateral feet are warm  Pulmonary/Chest: Effort normal and breath sounds normal.  Musculoskeletal: Normal range of motion. She exhibits edema (Moderate nonpitting edema noted to the bilateral lower extremity).  Neurological: She is alert and oriented to person, place, and time.  Skin: Skin is warm and dry. She is not diaphoretic.  Scattered less than 1 cm varicosities noted to the bilateral lower extremity.  There is no stasis dermatitis, fibrosis cellulitis or ulcerations noted.  Psychiatric: She has a normal mood and affect. Her behavior is normal. Judgment and thought content normal.  Vitals reviewed.  BP 110/82 (BP Location: Left Arm, Patient Position: Sitting)   Pulse 73   Resp 16   Ht  (1.676 m)   Wt 218 lb (98.9 kg)   BMI 35.19 kg/m   Past Medical History:  Diagnosis Date  . Allergic rhinitis   . Asthma   . Depression   . Headache(784.0)   . Renal insufficiency    Stage 3 kidney disease.    Social History   Socioeconomic History  . Marital status: Married    Spouse name: Not on file  . Number of children: Not on file  . Years of education: Not on file  . Highest education level: Not on file  Occupational  History  . Not on file  Social Needs  . Financial resource strain: Not on file  . Food insecurity:    Worry: Not on file    Inability: Not on file  . Transportation needs:    Medical: Not on file    Non-medical: Not on file  Tobacco Use  . Smoking status: Never Smoker  . Smokeless tobacco: Never Used  Substance and Sexual Activity  . Alcohol use: No    Alcohol/week: 0.0 oz  . Drug use: No  . Sexual activity: Yes    Birth control/protection: Surgical  Lifestyle  . Physical  activity:    Days per week: Not on file    Minutes per session: Not on file  . Stress: Not on file  Relationships  . Social connections:    Talks on phone: Not on file    Gets together: Not on file    Attends religious service: Not on file    Active member of club or organization: Not on file    Attends meetings of clubs or organizations: Not on file    Relationship status: Not on file  . Intimate partner violence:    Fear of current or ex partner: Not on file    Emotionally abused: Not on file    Physically abused: Not on file    Forced sexual activity: Not on file  Other Topics Concern  . Not on file  Social History Narrative  . Not on file   Past Surgical History:  Procedure Laterality Date  . ABDOMINAL HYSTERECTOMY  2009   still has 1 ovary  . BREAST BIOPSY Left 09/30/2002   Ductogram-neg  . INSERT / REPLACE / REMOVE PACEMAKER     Permanent pacemaker, Dr. [name]  . Lobe hemithroidectomy     Right   Family History  Problem Relation Age of Onset  . Heart disease Father        pacemaker,arrythmias  . Asthma Other   . Coronary artery disease Other        Female 1st degree relative <50  . Lung cancer Other   . Prostate cancer Other        1st degree relative <50  . COPD Maternal Grandmother   . Breast cancer Maternal Grandmother   . Heart disease Maternal Grandfather   . Liver disease Paternal Grandmother   . Heart disease Paternal Grandfather     Allergies  Allergen Reactions  . Amoxicillin Other (See Comments)  . Clavulanic Acid Other (See Comments)  . Morphine And Related     vomiting      Assessment & Plan:  Presents as a new patient referred by Dr. Luna Glasgow for evaluation of bilateral lower extremity edema and discomfort.  The patient endorses a long-standing history of experiencing swelling to the bilateral legs however her discomfort has progressively worsened over the last few weeks.  The patient does experience a tingling and burning especially to the left  foot most lead towards the end of the day when her edema is worse.  The patient denies any claudication-like symptoms, rest pain or ulceration to the bilateral lower extremity.  The patient denies any recent surgery or trauma to the bilateral lower extremity.  At this time, the patient does not engage in conservative therapy including wearing medical grade 1 compression socks or elevating her legs.  The patient does note that her symptoms have progressed to the point that she is unable to function on a daily basis and feels  that they have become lifestyle limiting.  Patient denies any fever, nausea vomiting.  1. Bilateral leg edema - New The patient was encouraged to wear graduated compression stockings (20-30 mmHg) on a daily basis. The patient was instructed to begin wearing the stockings first thing in the morning and removing them in the evening. The patient was instructed specifically not to sleep in the stockings. Prescription given.  In addition, behavioral modification including elevation during the day will be initiated. Anti-inflammatories for pain. I will bring the patient back at her convenience to undergo a bilateral lower extremity venous duplex to rule out reflux  The patient will follow up in one month to asses conservative management.  Information on compression stockings was given to the patient. The patient was instructed to call the office in the interim if any worsening edema or ulcerations to the legs, feet or toes occurs. The patient expresses their understanding.  - VAS [place] LOWER EXTREMITY VENOUS REFLUX; Future  2. Chronic kidney disease, stage III (moderate) (HCC) - Stable Is a contributing factor to the patient's bilateral lower extremity edema This is followed by the patient's nephrologist and primary care physician  Current Outpatient Medications on File Prior to Visit  Medication Sig Dispense Refill  . atorvastatin (LIPITOR) 80 MG tablet Take 1 tablet (80 mg total) by  mouth daily. 30 tablet 1  . azelastine (OPTIVAR) 0.05 % ophthalmic solution Place 1 drop every morning into both eyes.  3  . BREO ELLIPTA 200-25 MCG/INH AEPB Inhale 1 puff every morning into the lungs.  3  . buPROPion (WELLBUTRIN XL) 150 MG 24 hr tablet Take 2 tablets (300 mg total) by mouth daily. 60 tablet 0  . furosemide (LASIX) 20 MG tablet   3  . montelukast (SINGULAIR) 10 MG tablet TAKE 1 TABLET (10 MG TOTAL) BY MOUTH AT BEDTIME. 30 tablet 11  . rizatriptan (MAXALT) 10 MG tablet Take 1 tablet (10 mg total) by mouth as needed for migraine. May repeat in 2 hours if needed 10 tablet 12  . topiramate (TOPAMAX) 100 MG tablet TAKE 1 TABLET BY MOUTH DAILY 30 tablet 5  . traZODone (DESYREL) 50 MG tablet Take 2 tablets (100 mg total) by mouth at bedtime as needed for sleep. 60 tablet 3  . albuterol (PROVENTIL HFA;VENTOLIN HFA) 108 (90 Base) MCG/ACT inhaler Inhale 1-2 puffs into the lungs every 6 (six) hours as needed for wheezing or shortness of breath. (Patient not taking: Reported on 02/18/2018) 1 Inhaler 0  . busPIRone (BUSPAR) 10 MG tablet Take 1 tablet (10 mg total) by mouth 2 (two) times daily as needed. (Patient not taking: Reported on 02/18/2018) 180 tablet 1  . doxycycline (VIBRA-TABS) 100 MG tablet Take 1 tablet (100 mg total) by mouth 2 (two) times daily. (Patient not taking: Reported on 01/04/2018) 20 tablet 0  . ezetimibe (ZETIA) 10 MG tablet Take 1 tablet (10 mg total) by mouth daily. (Patient not taking: Reported on 02/18/2018) 90 tablet 3  . levocetirizine (XYZAL) 5 MG tablet Take 5 mg daily by mouth.  3   Current Facility-Administered Medications on File Prior to Visit  Medication Dose Route Frequency Provider Last Rate Last Dose  . cyanocobalamin ((VITAMIN B-12)) injection 1,000 mcg  1,000 mcg Intramuscular Q30 days Kerman Passey, MD   1,000 mcg at 01/19/18 0840   There are no Patient Instructions on file for this visit. No follow-ups on file.  Muhamad Serano A Wylie Russon, PA-C

## 2018-02-19 ENCOUNTER — Ambulatory Visit (INDEPENDENT_AMBULATORY_CARE_PROVIDER_SITE_OTHER): Payer: No Typology Code available for payment source

## 2018-02-19 DIAGNOSIS — E538 Deficiency of other specified B group vitamins: Secondary | ICD-10-CM | POA: Diagnosis not present

## 2018-02-19 MED ORDER — CYANOCOBALAMIN 1000 MCG/ML IJ SOLN
1000.0000 ug | Freq: Once | INTRAMUSCULAR | Status: AC
  Administered 2018-02-19: 1000 ug via INTRAMUSCULAR

## 2018-02-22 ENCOUNTER — Telehealth: Payer: Self-pay

## 2018-02-22 ENCOUNTER — Other Ambulatory Visit: Payer: Self-pay | Admitting: Family Medicine

## 2018-02-22 DIAGNOSIS — H5789 Other specified disorders of eye and adnexa: Secondary | ICD-10-CM

## 2018-02-22 NOTE — Telephone Encounter (Signed)
Copied from CRM [id number]#[id number]. Topic: Referral - Request >> Feb 22, 2018  8:39 AM Percival SpanishKennedy, Cheryl W wrote:  Pt call to say her eye is red and swollen and believe her contact is stuck and need a referral today to for to [facility]   Pt number [phone]

## 2018-02-25 ENCOUNTER — Encounter: Payer: Self-pay | Admitting: Family Medicine

## 2018-02-25 MED ORDER — TRAZODONE HCL 100 MG PO TABS: 100.0000 mg | ORAL_TABLET | Freq: Every evening | ORAL | 5 refills | Status: DC | PRN

## 2018-02-25 NOTE — Addendum Note (Signed)
Addended by: Sonia Stickels, Janit BernMELINDA P on: 02/25/2018 03:20 PM   Modules accepted: Orders

## 2018-03-10 ENCOUNTER — Encounter (INDEPENDENT_AMBULATORY_CARE_PROVIDER_SITE_OTHER): Payer: Self-pay | Admitting: Vascular Surgery

## 2018-03-10 ENCOUNTER — Ambulatory Visit (INDEPENDENT_AMBULATORY_CARE_PROVIDER_SITE_OTHER): Payer: No Typology Code available for payment source | Admitting: Vascular Surgery

## 2018-03-10 ENCOUNTER — Ambulatory Visit (INDEPENDENT_AMBULATORY_CARE_PROVIDER_SITE_OTHER): Payer: No Typology Code available for payment source

## 2018-03-10 VITALS — BP 122/62 | HR 82 | Ht 65.0 in | Wt 210.2 lb

## 2018-03-10 DIAGNOSIS — I890 Lymphedema, not elsewhere classified: Secondary | ICD-10-CM

## 2018-03-10 DIAGNOSIS — R600 Localized edema: Secondary | ICD-10-CM | POA: Diagnosis not present

## 2018-03-10 DIAGNOSIS — I872 Venous insufficiency (chronic) (peripheral): Secondary | ICD-10-CM

## 2018-03-10 NOTE — Progress Notes (Signed)
Subjective:    Patient ID: Emma Rogers, female    DOB: [birth year], 52 y.o.   MRN: [id number] Chief Complaint  Patient presents with  . Follow-up    results, continued leg swelling    The patient presents to review vascular studies.  The patient was last seen on Feb 18, 2018 for evaluation of bilateral lower extremity edema and discomfort.  Since our initial visit, the patient is started to engage in conservative therapy including wearing medical grade 1 compression socks, elevating her legs and remaining active with minimal improvement in her symptoms.  The patient still experiences edema which causes her discomfort.  The patient feels her symptoms have slowly progressed to the point that she is unable to function on a daily basis and they have become lifestyle limiting.  The patient underwent a bilateral lower extremity venous duplex which was notable for reflux in the bilateral common femoral vein and popliteal veins.  No reflux was noted in the bilateral great saphenous and small saphenous veins.  No evidence of deep vein or superficial thrombophlebitis.  The patient denies any claudication-like symptoms, rest pain or ulceration to the bilateral lower extremity.  The patient denies any fever, nausea vomiting.  Review of Systems  Constitutional: Negative.   HENT: Negative.   Eyes: Negative.   Respiratory: Negative.   Cardiovascular: Positive for leg swelling.  Endocrine: Negative.   Genitourinary: Negative.   Musculoskeletal: Negative.   Skin: Negative.   Allergic/Immunologic: Negative.   Neurological: Negative.   Hematological: Negative.   Psychiatric/Behavioral: Negative.       Objective:   Physical Exam  Constitutional: She appears well-developed and well-nourished. No distress.  HENT:  Head: Normocephalic and atraumatic.  Right Ear: External ear normal.  Left Ear: External ear normal.  Eyes: Pupils are equal, round, and reactive to light. Conjunctivae and EOM are normal.    Neck: Normal range of motion.  Cardiovascular: Normal rate, regular rhythm, normal heart sounds and intact distal pulses.  Pulses:      Radial pulses are 2+ on the right side, and 2+ on the left side.  Hard to palpate pedal pulses due to body habitus and edema  Pulmonary/Chest: Effort normal and breath sounds normal.  Musculoskeletal: Normal range of motion. She exhibits edema (Mild to moderate 1+ pitting edema noted bilaterally).  Skin: Skin is warm and dry. She is not diaphoretic.  Psychiatric: She has a normal mood and affect. Her behavior is normal. Judgment and thought content normal.  Vitals reviewed.  BP 122/62 (BP Location: Right Arm, Patient Position: Sitting)   Pulse 82   Ht 5\' 5"  (1.651 m)   Wt 210 lb 3.2 oz (95.3 kg)   BMI 34.98 kg/m   Past Medical History:  Diagnosis Date  . Allergic rhinitis   . Asthma   . Depression   . Headache(784.0)   . Renal insufficiency    Stage 3 kidney disease.    Social History   Socioeconomic History  . Marital status: Married    Spouse name: Not on file  . Number of children: Not on file  . Years of education: Not on file  . Highest education level: Not on file  Occupational History  . Not on file  Social Needs  . Financial resource strain: Not on file  . Food insecurity:    Worry: Not on file    Inability: Not on file  . Transportation needs:    Medical: Not on file    Non-medical:  Not on file  Tobacco Use  . Smoking status: Never Smoker  . Smokeless tobacco: Never Used  Substance and Sexual Activity  . Alcohol use: No    Alcohol/week: 0.0 oz  . Drug use: No  . Sexual activity: Yes    Birth control/protection: Surgical  Lifestyle  . Physical activity:    Days per week: Not on file    Minutes per session: Not on file  . Stress: Not on file  Relationships  . Social connections:    Talks on phone: Not on file    Gets together: Not on file    Attends religious service: Not on file    Active member of club or  organization: Not on file    Attends meetings of clubs or organizations: Not on file    Relationship status: Not on file  . Intimate partner violence:    Fear of current or ex partner: Not on file    Emotionally abused: Not on file    Physically abused: Not on file    Forced sexual activity: Not on file  Other Topics Concern  . Not on file  Social History Narrative  . Not on file   Past Surgical History:  Procedure Laterality Date  . ABDOMINAL HYSTERECTOMY  2009   still has 1 ovary  . BREAST BIOPSY Left 09/30/2002   Ductogram-neg  . INSERT / REPLACE / REMOVE PACEMAKER     Permanent pacemaker, Dr. [name]  . Lobe hemithroidectomy     Right   Family History  Problem Relation Age of Onset  . Heart disease Father        pacemaker,arrythmias  . Asthma Other   . Coronary artery disease Other        Female 1st degree relative <50  . Lung cancer Other   . Prostate cancer Other        1st degree relative <50  . COPD Maternal Grandmother   . Breast cancer Maternal Grandmother   . Heart disease Maternal Grandfather   . Liver disease Paternal Grandmother   . Heart disease Paternal Grandfather    Allergies  Allergen Reactions  . Amoxicillin Other (See Comments)  . Clavulanic Acid Other (See Comments)  . Morphine And Related     vomiting      Assessment & Plan:  The patient presents to review vascular studies.  The patient was last seen on Feb 18, 2018 for evaluation of bilateral lower extremity edema and discomfort.  Since our initial visit, the patient is started to engage in conservative therapy including wearing medical grade 1 compression socks, elevating her legs and remaining active with minimal improvement in her symptoms.  The patient still experiences edema which causes her discomfort.  The patient feels her symptoms have slowly progressed to the point that she is unable to function on a daily basis and they have become lifestyle limiting.  The patient underwent a bilateral  lower extremity venous duplex which was notable for reflux in the bilateral common femoral vein and popliteal veins.  No reflux was noted in the bilateral great saphenous and small saphenous veins.  No evidence of deep vein or superficial thrombophlebitis.  The patient denies any claudication-like symptoms, rest pain or ulceration to the bilateral lower extremity.  The patient denies any fever, nausea vomiting  1. Chronic venous insufficiency - New The patient was found to have reflux noted in the bilateral common femoral and popliteal vein.  Due to the location of the patient's  reflux she is not eligible for any type of endovenous laser ablation or sclerotherapy. The patient is to continue engaging in conservative therapy including wearing medical grade one compression socks, elevating her legs and remaining active. I will see the patient back in approximately one month to assess her progress with conservative therapy If the patient fails conservative therapy she may be a candidate for a lymphedema pump  2. Lymphedema - New As above  Current Outpatient Medications on File Prior to Visit  Medication Sig Dispense Refill  . atorvastatin (LIPITOR) 80 MG tablet Take 1 tablet (80 mg total) by mouth daily. 30 tablet 1  . BREO ELLIPTA 200-25 MCG/INH AEPB Inhale 1 puff every morning into the lungs.  3  . buPROPion (WELLBUTRIN XL) 150 MG 24 hr tablet TAKE 2 TABLETS BY MOUTH DAILY. 60 tablet 0  . furosemide (LASIX) 20 MG tablet   3  . levocetirizine (XYZAL) 5 MG tablet Take 5 mg daily by mouth.  3  . montelukast (SINGULAIR) 10 MG tablet TAKE 1 TABLET (10 MG TOTAL) BY MOUTH AT BEDTIME. 30 tablet 11  . rizatriptan (MAXALT) 10 MG tablet Take 1 tablet (10 mg total) by mouth as needed for migraine. May repeat in 2 hours if needed 10 tablet 12  . topiramate (TOPAMAX) 100 MG tablet TAKE 1 TABLET BY MOUTH DAILY 30 tablet 5  . traZODone (DESYREL) 100 MG tablet Take 1 tablet (100 mg total) by mouth at bedtime as  needed for sleep. 30 tablet 5  . albuterol (PROVENTIL HFA;VENTOLIN HFA) 108 (90 Base) MCG/ACT inhaler Inhale 1-2 puffs into the lungs every 6 (six) hours as needed for wheezing or shortness of breath. (Patient not taking: Reported on 02/18/2018) 1 Inhaler 0  . azelastine (OPTIVAR) 0.05 % ophthalmic solution Place 1 drop every morning into both eyes.  3  . busPIRone (BUSPAR) 10 MG tablet Take 1 tablet (10 mg total) by mouth 2 (two) times daily as needed. (Patient not taking: Reported on 02/18/2018) 180 tablet 1  . doxycycline (VIBRA-TABS) 100 MG tablet Take 1 tablet (100 mg total) by mouth 2 (two) times daily. (Patient not taking: Reported on 01/04/2018) 20 tablet 0  . ezetimibe (ZETIA) 10 MG tablet Take 1 tablet (10 mg total) by mouth daily. (Patient not taking: Reported on 02/18/2018) 90 tablet 3   Current Facility-Administered Medications on File Prior to Visit  Medication Dose Route Frequency Provider Last Rate Last Dose  . cyanocobalamin ((VITAMIN B-12)) injection 1,000 mcg  1,000 mcg Intramuscular Q30 days Kerman PasseyLada, Melinda P, MD   1,000 mcg at 01/19/18 0840   There are no Patient Instructions on file for this visit. No follow-ups on file.  Law Corsino A Tova Vater, PA-C

## 2018-03-12 ENCOUNTER — Encounter: Payer: Self-pay | Admitting: Family Medicine

## 2018-03-16 NOTE — Telephone Encounter (Signed)
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

## 2018-03-26 ENCOUNTER — Other Ambulatory Visit: Payer: Self-pay | Admitting: Family Medicine

## 2018-03-30 ENCOUNTER — Other Ambulatory Visit: Payer: Self-pay | Admitting: Family Medicine

## 2018-03-30 DIAGNOSIS — Z1231 Encounter for screening mammogram for malignant neoplasm of breast: Secondary | ICD-10-CM

## 2018-04-05 ENCOUNTER — Ambulatory Visit (INDEPENDENT_AMBULATORY_CARE_PROVIDER_SITE_OTHER): Payer: No Typology Code available for payment source | Admitting: Nurse Practitioner

## 2018-04-05 ENCOUNTER — Encounter: Payer: Self-pay | Admitting: Nurse Practitioner

## 2018-04-05 VITALS — BP 106/70 | HR 67 | Temp 98.9°F | Resp 16 | Ht 65.0 in | Wt 204.5 lb

## 2018-04-05 DIAGNOSIS — L02415 Cutaneous abscess of right lower limb: Secondary | ICD-10-CM

## 2018-04-05 MED ORDER — CEPHALEXIN 500 MG PO CAPS: 500.0000 mg | ORAL_CAPSULE | Freq: Two times a day (BID) | ORAL | 0 refills | Status: DC

## 2018-04-05 NOTE — Patient Instructions (Signed)
- use warm compress around 4 times daily- can use more often - take antibiotic twice a day with meals for one whole week even if your skin condition resolves before you are through with your medicine - Let us know if it is worsening in size, redness or pain or if you have fevers or chills.  Skin Abscess A skin abscess is an infected area on or under your skin that contains a collection of pus and other material. An abscess may also be called a furuncle, carbuncle, or boil. An abscess can occur in or on almost any part of your body. Some abscesses break open (rupture) on their own. Most continue to get worse unless they are treated. The infection can spread deeper into the body and eventually into your blood, which can make you feel ill. Treatment usually involves draining the abscess. What are the causes? An abscess occurs when germs, often bacteria, pass through your skin and cause an infection. This may be caused by:  A scrape or cut on your skin.  A puncture wound through your skin, including a needle injection.  Blocked oil or sweat glands.  Blocked and infected hair follicles.  A cyst that forms beneath your skin (sebaceous cyst) and becomes infected.  What increases the risk? This condition is more likely to develop in people who:  Have a weak body defense system (immune system).  Have diabetes.  Have dry and irritated skin.  Get frequent injections or use illegal IV drugs.  Have a foreign body in a wound, such as a splinter.  Have problems with their lymph system or veins.  What are the signs or symptoms? An abscess may start as a painful, firm bump under the skin. Over time, the abscess may get larger or become softer. Pus may appear at the top of the abscess, causing pressure and pain. It may eventually break through the skin and drain. Other symptoms include:  Redness.  Warmth.  Swelling.  Tenderness.  A sore on the skin.  How is this diagnosed? This  condition is diagnosed based on your medical history and a physical exam. A sample of pus may be taken from the abscess to find out what is causing the infection and what antibiotics can be used to treat it. You also may have:  Blood tests to look for signs of infection or spread of an infection to your blood.  Imaging studies such as ultrasound, CT scan, or MRI if the abscess is deep.  How is this treated? Small abscesses that drain on their own may not need treatment. Treatment for an abscess that does not rupture on its own may include:  Warm compresses applied to the area several times per day.  Incision and drainage. Your health care provider will make an incision to open the abscess and will remove pus and any foreign body or dead tissue. The incision area may be packed with gauze to keep it open for a few days while it heals.  Antibiotic medicines to treat infection. For a severe abscess, you may first get antibiotics through an IV and then change to oral antibiotics.  Follow these instructions at home: Abscess Care  If you have an abscess that has not drained, place a warm, clean, wet washcloth over the abscess several times a day. Do this as told by your health care provider.  Follow instructions from your health care provider about how to take care of your abscess. Make sure you: ? Cover the  abscess with a bandage (dressing). ? Change your dressing or gauze as told by your health care provider. ? Wash your hands with soap and water before you change the dressing or gauze. If soap and water are not available, use hand sanitizer.  Check your abscess every day for signs of a worsening infection. Check for: ? More redness, swelling, or pain. ? More fluid or blood. ? Warmth. ? More pus or a bad smell. Medicines  Take over-the-counter and prescription medicines only as told by your health care provider.  If you were prescribed an antibiotic medicine, take it as told by your  health care provider. Do not stop taking the antibiotic even if you start to feel better. General instructions  To avoid spreading the infection: ? Do not share personal care items, towels, or hot tubs with others. ? Avoid making skin contact with other people.  Keep all follow-up visits as told by your health care provider. This is important. Contact a health care provider if:  You have more redness, swelling, or pain around your abscess.  You have more fluid or blood coming from your abscess.  Your abscess feels warm to the touch.  You have more pus or a bad smell coming from your abscess.  You have a fever.  You have muscle aches.  You have chills or a general ill feeling. Get help right away if:  You have severe pain.  You see red streaks on your skin spreading away from the abscess. This information is not intended to replace advice given to you by your health care provider. Make sure you discuss any questions you have with your health care provider. Document Released: 06/18/2005 Document Revised: 05/04/2016 Document Reviewed: 07/18/2015 Elsevier Interactive Patient Education  [employer].

## 2018-04-05 NOTE — Progress Notes (Addendum)
Name: Emma RangerLorrie H Rogers   MRN: [id number]    DOB: [birth year]   Date:04/05/2018       Progress Note  Subjective  Chief Complaint  Chief Complaint  Patient presents with  . Insect Bite    She complains of a painful knot on the upper inside of her thigh that she thinks is from a bug bite. It happened either yesterday or day before yesterday. She does recall being bitten by something. Knot came up all of a sudden and it appears to be swelling, redness and bruised. She has not tried to treat it with anything otc.    HPI  Right upper thigh has painful knot area- thought it was a bug bite. Tender to touch and redness outside and seems bruised inside. No drainage, fevers, chills.     Patient Active Problem List   Diagnosis Date Noted  . Chronic venous insufficiency 03/10/2018  . Lymphedema 03/10/2018  . Elevated blood pressure reading 10/13/2017  . Anxiety 10/13/2017  . Familial hyperlipidemia 10/10/2017  . Bilateral leg edema 09/03/2017  . Depression 09/03/2017  . Chronic kidney disease, stage III (moderate) (HCC) 09/03/2017  . Vitamin D deficiency 09/03/2017  . Vitamin B12 deficiency 09/03/2017  . Medication monitoring encounter 09/03/2017  . Insomnia 08/11/2017  . Menopausal symptoms 08/11/2017  . Shortness of breath 05/18/2017  . Chest pain 03/31/2017  . Migraine without aura and without status migrainosus, not intractable 02/21/2017  . Prediabetes 02/20/2017  . Fatigue 02/20/2017  . Obesity (BMI 35.0-39.9 without comorbidity) 02/20/2017  . 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 VASOVAGAL SYNCOPE 11/27/2009  . Allergic rhinitis 01/18/2008  . HEADACHE 01/18/2008    Past Medical History:  Diagnosis Date  . Allergic rhinitis   . Asthma   . Depression   . Headache(784.0)   . Renal insufficiency    Stage 3 kidney disease.     Past Surgical History:  Procedure Laterality Date  . ABDOMINAL HYSTERECTOMY  2009   still has 1 ovary  . BREAST BIOPSY Left 09/30/2002   Ductogram-neg  .  INSERT / REPLACE / REMOVE PACEMAKER     Permanent pacemaker, Dr. Graciela HusbandsKlein  . Lobe hemithroidectomy     Right    Social History   Tobacco Use  . Smoking status: Never Smoker  . Smokeless tobacco: Never Used  Substance Use Topics  . Alcohol use: No    Alcohol/week: 0.0 oz     Current Outpatient Medications:  .  albuterol (PROVENTIL HFA;VENTOLIN HFA) 108 (90 Base) MCG/ACT inhaler, Inhale 1-2 puffs into the lungs every 6 (six) hours as needed for wheezing or shortness of breath., Disp: 1 Inhaler, Rfl: 0 .  atorvastatin (LIPITOR) 80 MG tablet, Take 1 tablet (80 mg total) by mouth daily., Disp: 30 tablet, Rfl: 1 .  azelastine (OPTIVAR) 0.05 % ophthalmic solution, Place 1 drop every morning into both eyes., Disp: , Rfl: 3 .  BREO ELLIPTA 200-25 MCG/INH AEPB, Inhale 1 puff every morning into the lungs., Disp: , Rfl: 3 .  buPROPion (WELLBUTRIN XL) 150 MG 24 hr tablet, TAKE 2 TABLETS BY MOUTH DAILY., Disp: 60 tablet, Rfl: 2 .  busPIRone (BUSPAR) 10 MG tablet, Take 1 tablet (10 mg total) by mouth 2 (two) times daily as needed., Disp: 180 tablet, Rfl: 1 .  doxycycline (VIBRA-TABS) 100 MG tablet, Take 1 tablet (100 mg total) by mouth 2 (two) times daily., Disp: 20 tablet, Rfl: 0 .  ezetimibe (ZETIA) 10 MG tablet, Take 1 tablet (10 mg total) by mouth daily.,  Disp: 90 tablet, Rfl: 3 .  furosemide (LASIX) 20 MG tablet, , Disp: , Rfl: 3 .  levocetirizine (XYZAL) 5 MG tablet, Take 5 mg daily by mouth., Disp: , Rfl: 3 .  montelukast (SINGULAIR) 10 MG tablet, TAKE 1 TABLET (10 MG TOTAL) BY MOUTH AT BEDTIME., Disp: 30 tablet, Rfl: 11 .  rizatriptan (MAXALT) 10 MG tablet, Take 1 tablet (10 mg total) by mouth as needed for migraine. May repeat in 2 hours if needed, Disp: 10 tablet, Rfl: 12 .  topiramate (TOPAMAX) 100 MG tablet, TAKE 1 TABLET BY MOUTH DAILY, Disp: 30 tablet, Rfl: 5 .  traZODone (DESYREL) 100 MG tablet, Take 1 tablet (100 mg total) by mouth at bedtime as needed for sleep., Disp: 30 tablet, Rfl:  5  Current Facility-Administered Medications:  .  cyanocobalamin ((VITAMIN B-12)) injection 1,000 mcg, 1,000 mcg, Intramuscular, Q30 days, [name], Melinda P, MD, 1,000 mcg at 01/19/18 0840  Allergies  Allergen Reactions  . Amoxicillin Other (See Comments)  . Clavulanic Acid Other (See Comments)  . Morphine And Related     vomiting    ROS   No other specific complaints in a complete review of systems (except as listed in HPI above).  Objective  Vitals:   04/05/18 1316  BP: 106/70  Pulse: 67  Resp: 16  Temp: 98.9 F (37.2 C)  TempSrc: Oral  SpO2: 95%  Weight: 204 lb 8 oz (92.8 kg)  Height: 5\' 5"  (1.651 m)     Body mass index is 34.03 kg/m.  Nursing Note and Vital Signs reviewed.  Physical Exam   Constitutional: Patient appears well-developed and well-nourished. No distress.  Cardiovascular: Normal rate Pulmonary/Chest: Effort normal. No respiratory distress or retractions. Skin: Right upper thigh has 1 cm red raised area- hot to touch with surrounding redness and tenderness. No swollen inguinal lymph nodes noted.  Psychiatric: Patient has a normal mood and affect. behavior is normal. Judgment and thought content normal.  No results found for this or any previous visit (from the past 72 hour(s)).  Assessment & Plan  1. Cutaneous abscess of right lower extremity -Warm compresses  - cephALEXin (KEFLEX) 500 MG capsule; Take 1 capsule (500 mg total) by mouth 2 (two) times daily.  Dispense: 14 capsule; Refill: 0    -Red flags and when to present for emergency care or RTC including fever >101.60F, increasing redness or streaking new/worsening/un-resolving symptoms, reviewed with patient at time of visit. Follow up and care instructions discussed and provided in AVS. ------------------------------------------------- I have reviewed this encounter including the documentation in this note and/or discussed this patient with the provider, Sharyon Cable DNP AGNP-C. I am  certifying that I agree with the content of this note as supervising physician. Baruch Gouty, MD [facility] 04/05/2018, 5:37 PM

## 2018-04-07 ENCOUNTER — Ambulatory Visit
Admission: RE | Admit: 2018-04-07 | Discharge: 2018-04-07 | Disposition: A | Discharge status: Home / Self Care | Payer: No Typology Code available for payment source | Source: Ambulatory Visit | Attending: Family Medicine | Admitting: Family Medicine

## 2018-04-07 DIAGNOSIS — Z1231 Encounter for screening mammogram for malignant neoplasm of breast: Secondary | ICD-10-CM | POA: Diagnosis not present

## 2018-04-13 ENCOUNTER — Ambulatory Visit (INDEPENDENT_AMBULATORY_CARE_PROVIDER_SITE_OTHER): Payer: No Typology Code available for payment source | Admitting: Vascular Surgery

## 2018-04-13 ENCOUNTER — Encounter (INDEPENDENT_AMBULATORY_CARE_PROVIDER_SITE_OTHER): Payer: Self-pay | Admitting: Vascular Surgery

## 2018-04-13 VITALS — BP 112/75 | HR 59 | Resp 15 | Ht 65.5 in | Wt 203.0 lb

## 2018-04-13 DIAGNOSIS — I872 Venous insufficiency (chronic) (peripheral): Secondary | ICD-10-CM | POA: Diagnosis not present

## 2018-04-13 DIAGNOSIS — I890 Lymphedema, not elsewhere classified: Secondary | ICD-10-CM

## 2018-04-13 NOTE — Progress Notes (Signed)
Subjective:    Patient ID: Emma Rogers, female    DOB: 06/27/1966, 52 y.o.   MRN: [id number] Chief Complaint  Patient presents with  . Follow-up    1 month Lymphedema check   Patient presents for a monthly chronic venous insufficiency / lymphedema follow-up.  Since our initial visit, the patient has been engaging in conservative therapy including wearing medical grade one compression socks, elevating her legs and remaining active with minimal improvement in her lower extremity edema and discomfort.  The patient notes that her symptoms have progressed to the point that she is unable to function on a daily basis and they have become lifestyle limiting.  The patient was noted to have venous insufficiency to the deep system during our last visit.  The patient denies any ulcer formation to the bilateral lower extremity.  Patient denies any recent bouts of cellulitis.  Patient denies any fever, nausea vomiting.  Review of Systems  Constitutional: Negative.   HENT: Negative.   Eyes: Negative.   Respiratory: Negative.   Cardiovascular: Positive for leg swelling.  Gastrointestinal: Negative.   Endocrine: Negative.   Genitourinary: Negative.   Musculoskeletal: Negative.   Skin: Negative.   Allergic/Immunologic: Negative.   Neurological: Negative.   Hematological: Negative.   Psychiatric/Behavioral: Negative.       Objective:   Physical Exam  Constitutional: She is oriented to person, place, and time. She appears well-developed and well-nourished. No distress.  HENT:  Head: Normocephalic and atraumatic.  Right Ear: External ear normal.  Left Ear: External ear normal.  Eyes: Pupils are equal, round, and reactive to light. Conjunctivae and EOM are normal.  Neck: Normal range of motion.  Cardiovascular: Normal rate, regular rhythm and normal heart sounds.  Pulmonary/Chest: Effort normal and breath sounds normal.  Musculoskeletal: Normal range of motion. She exhibits edema (Mild to  moderate nonpitting edema noted bilaterally).  Neurological: She is alert and oriented to person, place, and time.  Skin: Skin is warm and dry. She is not diaphoretic.  Psychiatric: She has a normal mood and affect. Her behavior is normal. Judgment and thought content normal.  Vitals reviewed.  BP 112/75 (BP Location: Left Arm, Patient Position: Sitting)   Pulse (!) 59   Resp 15   Ht 5' 5.5" (1.664 m)   Wt 203 lb (92.1 kg)   BMI 33.27 kg/m   Past Medical History:  Diagnosis Date  . Allergic rhinitis   . Asthma   . Depression   . Headache(784.0)   . Renal insufficiency    Stage 3 kidney disease.    Social History   Socioeconomic History  . Marital status: Married    Spouse name: Not on file  . Number of children: Not on file  . Years of education: Not on file  . Highest education level: Not on file  Occupational History  . Not on file  Social Needs  . Financial resource strain: Not on file  . Food insecurity:    Worry: Not on file    Inability: Not on file  . Transportation needs:    Medical: Not on file    Non-medical: Not on file  Tobacco Use  . Smoking status: Never Smoker  . Smokeless tobacco: Never Used  Substance and Sexual Activity  . Alcohol use: No    Alcohol/week: 0.0 oz  . Drug use: No  . Sexual activity: Yes    Birth control/protection: Surgical  Lifestyle  . Physical activity:    Days per  week: Not on file    Minutes per session: Not on file  . Stress: Not on file  Relationships  . Social connections:    Talks on phone: Not on file    Gets together: Not on file    Attends religious service: Not on file    Active member of club or organization: Not on file    Attends meetings of clubs or organizations: Not on file    Relationship status: Not on file  . Intimate partner violence:    Fear of current or ex partner: Not on file    Emotionally abused: Not on file    Physically abused: Not on file    Forced sexual activity: Not on file  Other  Topics Concern  . Not on file  Social History Narrative  . Not on file   Past Surgical History:  Procedure Laterality Date  . ABDOMINAL HYSTERECTOMY  2009   still has 1 ovary  . BREAST BIOPSY Left 09/30/2002   Ductogram-neg  . INSERT / REPLACE / REMOVE PACEMAKER     Permanent pacemaker, Dr. Graciela HusbandsKlein  . Lobe hemithroidectomy     Right   Family History  Problem Relation Age of Onset  . Heart disease Father        pacemaker,arrythmias  . Asthma Other   . Coronary artery disease Other        Female 1st degree relative <50  . Lung cancer Other   . Prostate cancer Other        1st degree relative <50  . COPD Maternal Grandmother   . Breast cancer Maternal Grandmother   . Heart disease Maternal Grandfather   . Liver disease Paternal Grandmother   . Heart disease Paternal Grandfather    Allergies  Allergen Reactions  . Amoxicillin Other (See Comments)  . Clavulanic Acid Other (See Comments)  . Morphine And Related     vomiting      Assessment & Plan:  Patient presents for a monthly chronic venous insufficiency / lymphedema follow-up.  Since our initial visit, the patient has been engaging in conservative therapy including wearing medical grade one compression socks, elevating her legs and remaining active with minimal improvement in her lower extremity edema and discomfort.  The patient notes that her symptoms have progressed to the point that she is unable to function on a daily basis and they have become lifestyle limiting.  The patient was noted to have venous insufficiency to the deep system during our last visit.  The patient denies any ulcer formation to the bilateral lower extremity.  Patient denies any recent bouts of cellulitis.  Patient denies any fever, nausea vomiting.  1. Chronic venous insufficiency - Stable Despite conservative treatments including exercise, elevation and class I compression stockings the patient still presents with stage I lymphedema The patient would  greatly benefit from the added therapy of a lymphedema pump I will applied to the patient's insurance In the meantime, the patient should continue wearing medical grade 1 compression socks, elevating her legs even during the day and remaining active I will see the patient back in 6 months to assess her progress with conservative therapy and the addition of a lymphedema pump  2. Lymphedema - Stable As above  Current Outpatient Medications on File Prior to Visit  Medication Sig Dispense Refill  . albuterol (PROVENTIL HFA;VENTOLIN HFA) 108 (90 Base) MCG/ACT inhaler Inhale 1-2 puffs into the lungs every 6 (six) hours as needed for wheezing or shortness of  breath. 1 Inhaler 0  . atorvastatin (LIPITOR) 80 MG tablet Take 1 tablet (80 mg total) by mouth daily. 30 tablet 1  . azelastine (OPTIVAR) 0.05 % ophthalmic solution Place 1 drop every morning into both eyes.  3  . BREO ELLIPTA 200-25 MCG/INH AEPB Inhale 1 puff every morning into the lungs.  3  . buPROPion (WELLBUTRIN XL) 150 MG 24 hr tablet TAKE 2 TABLETS BY MOUTH DAILY. 60 tablet 2  . busPIRone (BUSPAR) 10 MG tablet Take 1 tablet (10 mg total) by mouth 2 (two) times daily as needed. 180 tablet 1  . cephALEXin (KEFLEX) 500 MG capsule Take 1 capsule (500 mg total) by mouth 2 (two) times daily. 14 capsule 0  . doxycycline (VIBRA-TABS) 100 MG tablet Take 1 tablet (100 mg total) by mouth 2 (two) times daily. 20 tablet 0  . ezetimibe (ZETIA) 10 MG tablet Take 1 tablet (10 mg total) by mouth daily. 90 tablet 3  . furosemide (LASIX) 20 MG tablet   3  . levocetirizine (XYZAL) 5 MG tablet Take 5 mg daily by mouth.  3  . montelukast (SINGULAIR) 10 MG tablet TAKE 1 TABLET (10 MG TOTAL) BY MOUTH AT BEDTIME. 30 tablet 11  . rizatriptan (MAXALT) 10 MG tablet Take 1 tablet (10 mg total) by mouth as needed for migraine. May repeat in 2 hours if needed 10 tablet 12  . topiramate (TOPAMAX) 100 MG tablet TAKE 1 TABLET BY MOUTH DAILY 30 tablet 5  . traZODone  (DESYREL) 100 MG tablet Take 1 tablet (100 mg total) by mouth at bedtime as needed for sleep. 30 tablet 5   Current Facility-Administered Medications on File Prior to Visit  Medication Dose Route Frequency Provider Last Rate Last Dose  . cyanocobalamin ((VITAMIN B-12)) injection 1,000 mcg  1,000 mcg Intramuscular Q30 days Kerman Passey, MD   1,000 mcg at 01/19/18 0840   There are no Patient Instructions on file for this visit. No follow-ups on file.  Mandee Pluta A Murdock Jellison, PA-C

## 2018-04-18 IMAGING — MG MM DIGITAL SCREENING BILAT W/ TOMO W/ CAD
8 of 13 series · 8 of 29 positions shown · non-contrast
Comparison: Previous exam(s).

CLINICAL DATA: Screening.

EXAM:
2D DIGITAL SCREENING BILATERAL MAMMOGRAM WITH CAD AND ADJUNCT TOMO

[L MLO (1 of 2)]
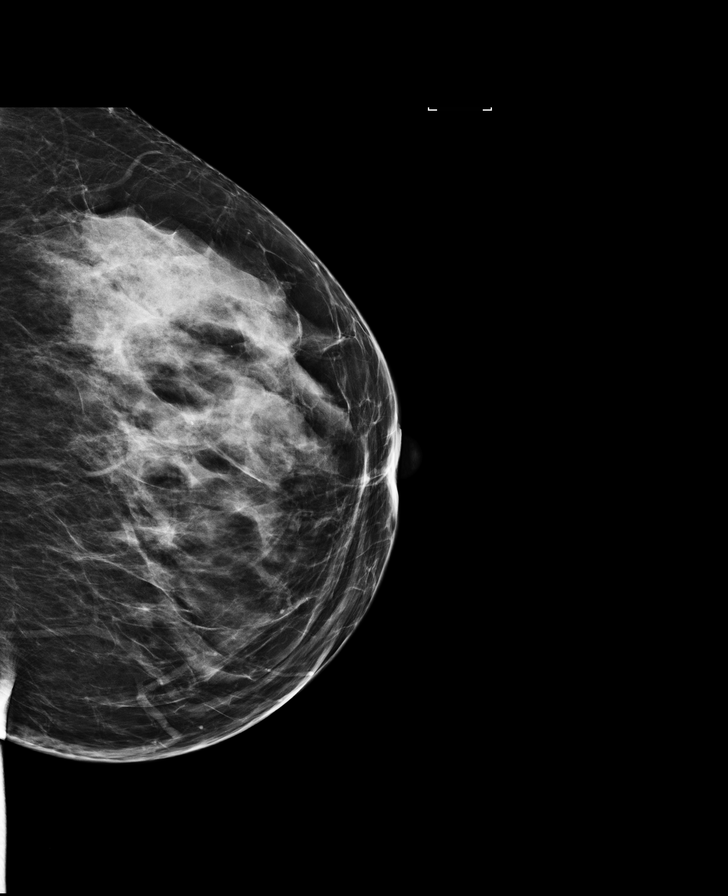

[L CC synth-2D]
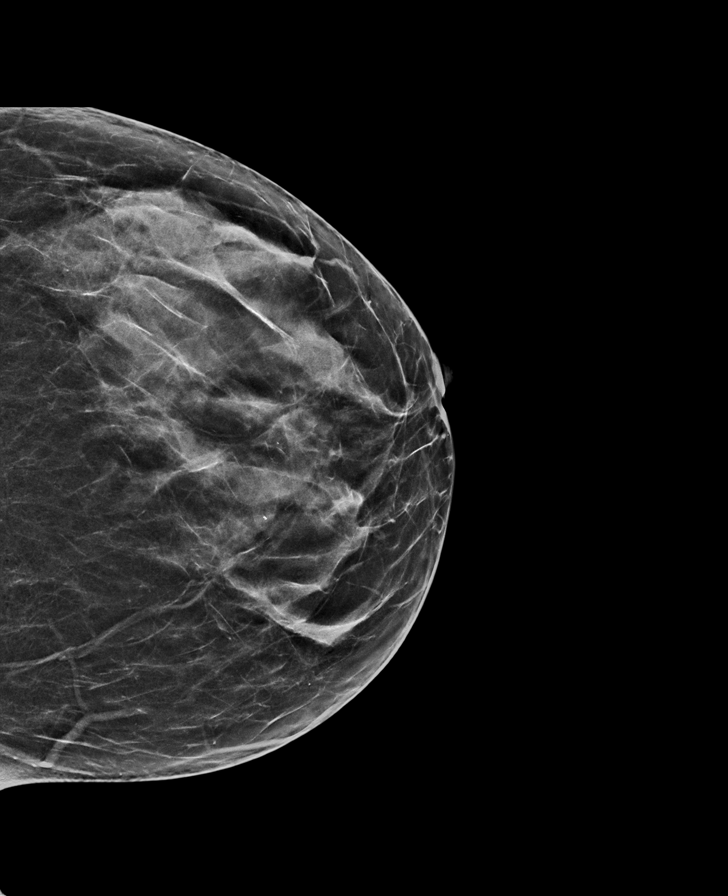

[L MLO (2 of 2)]
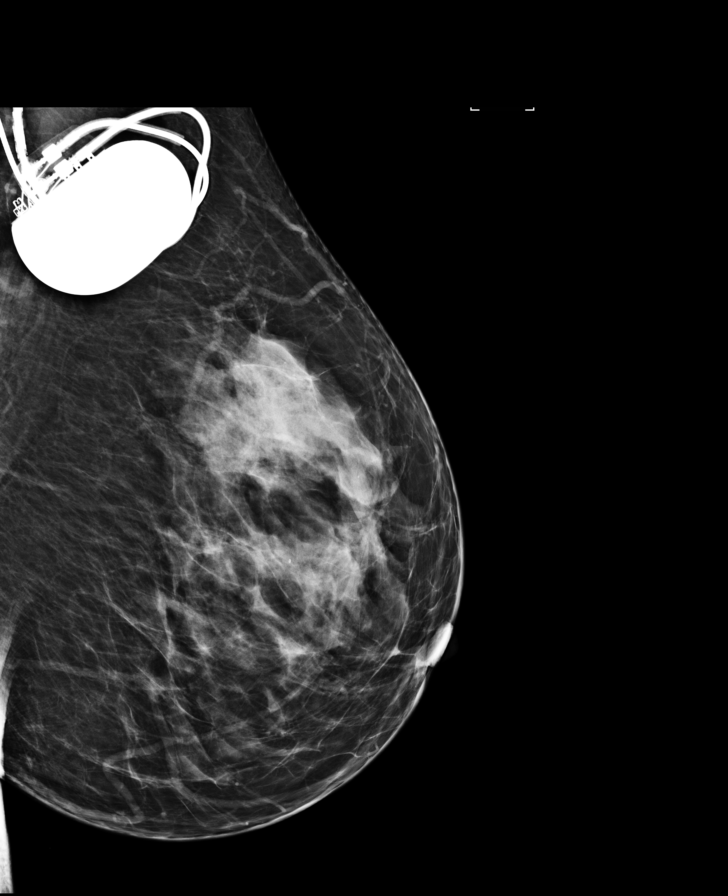

[R MLO synth-2D]
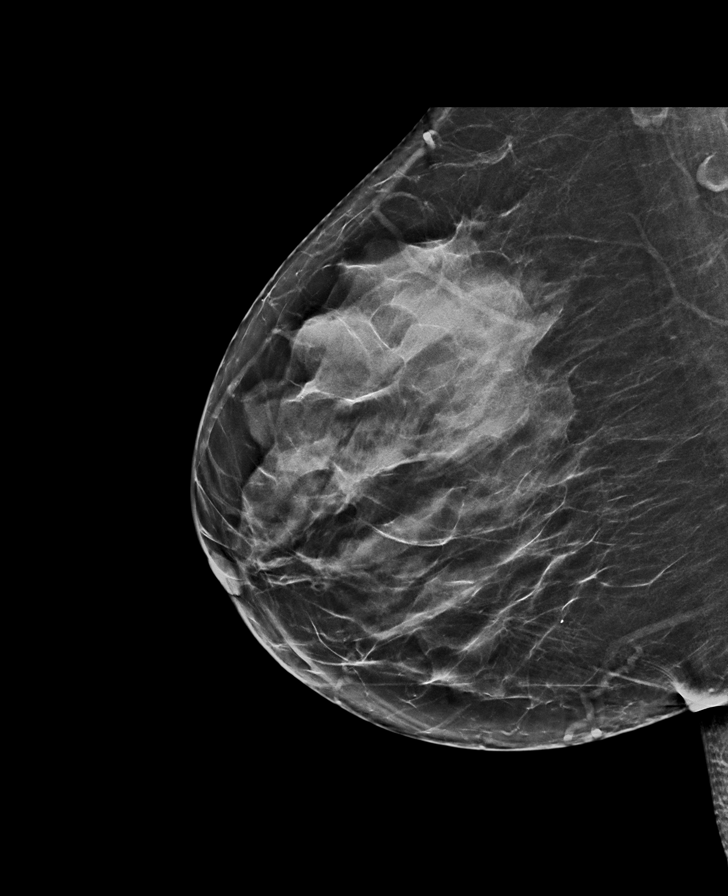

[R CC]
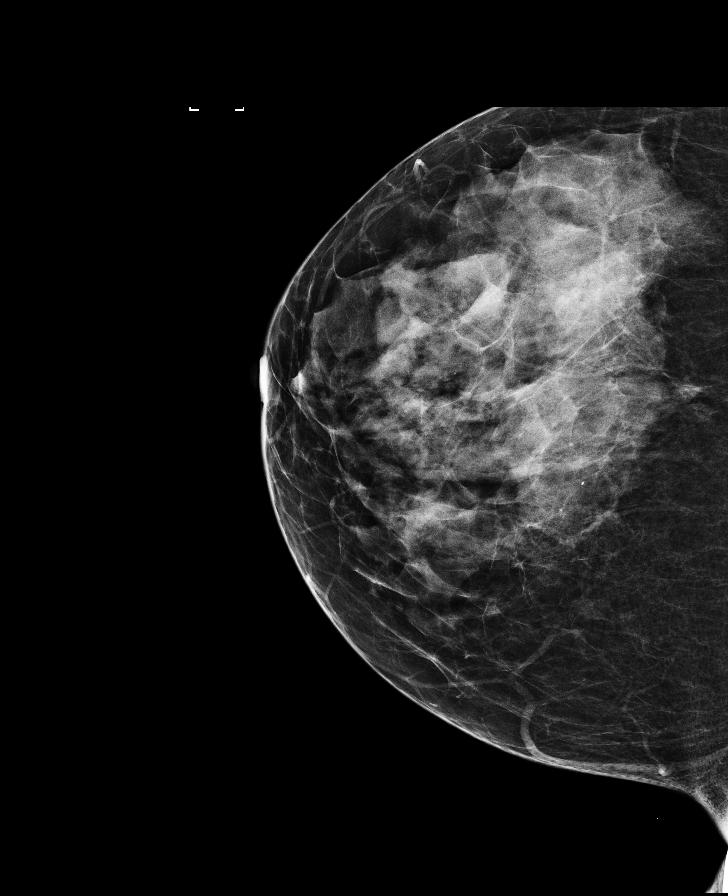

[L CC]
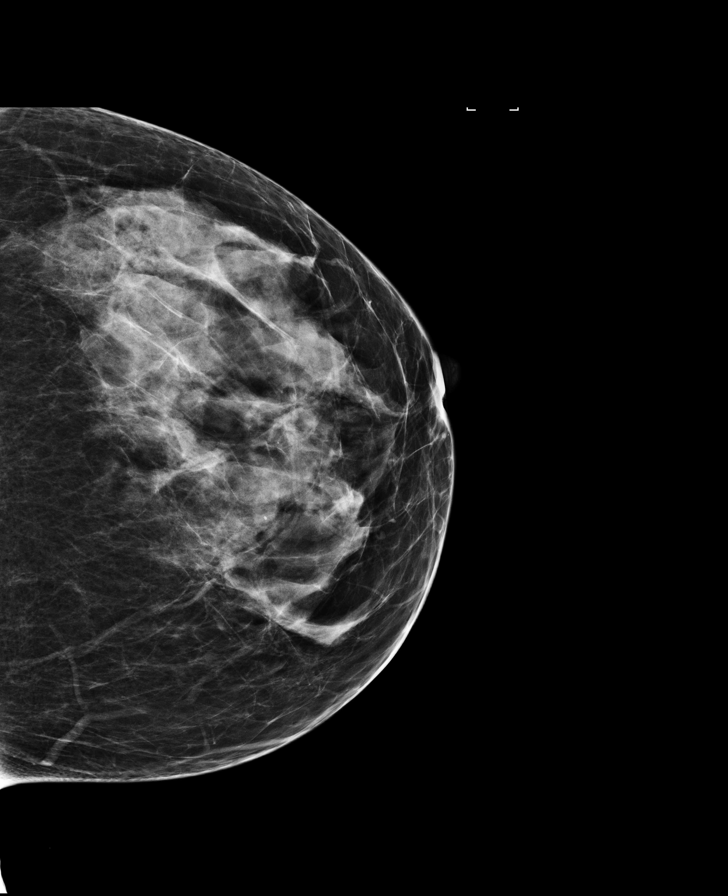

[L MLO synth-2D]
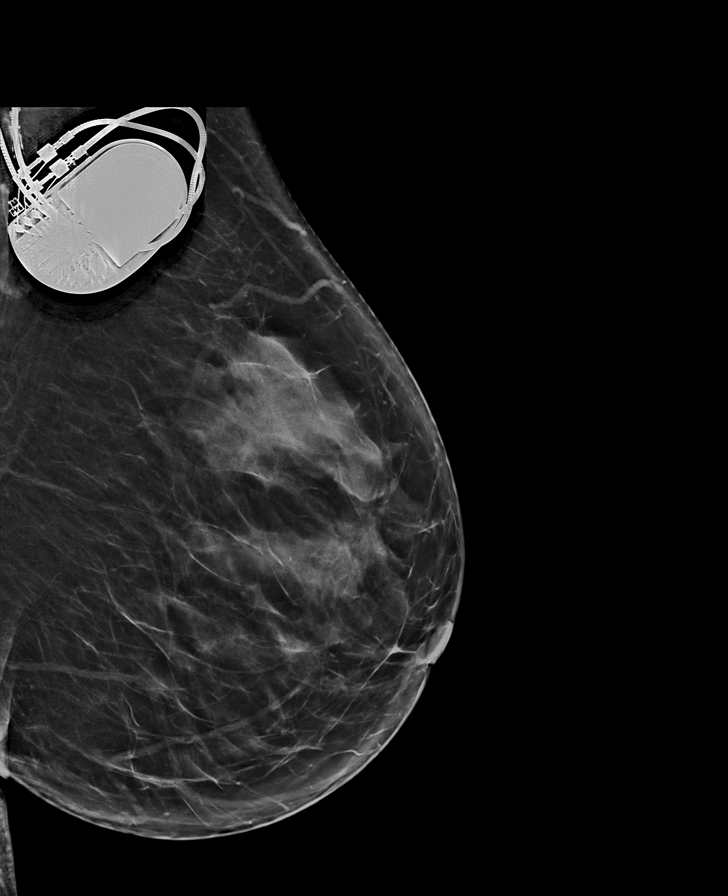

[R MLO]
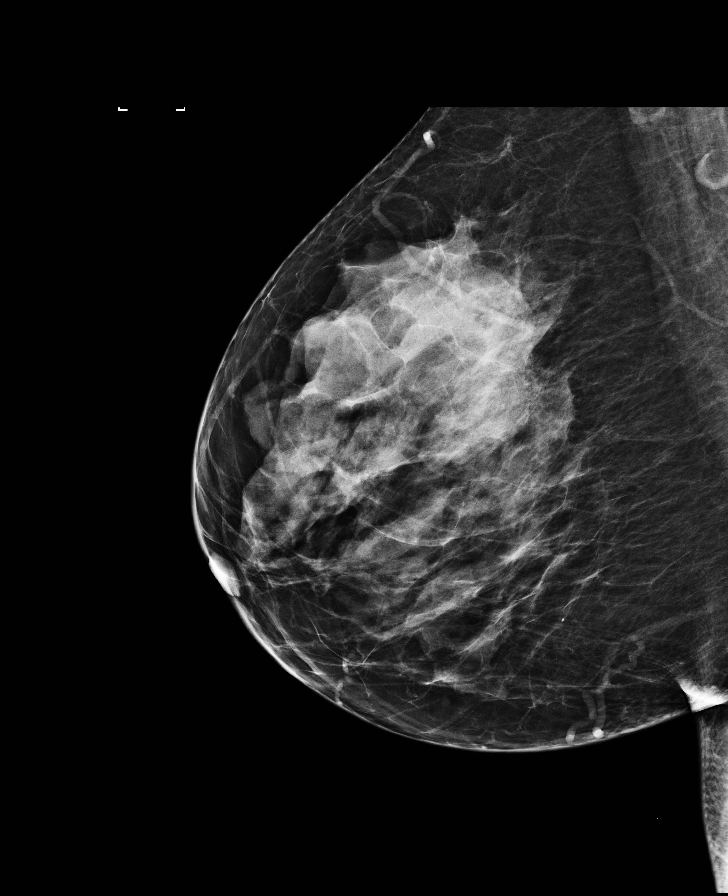

[8 of 29 positions shown; findings below may reference images not displayed]

ACR Breast Density Category c: The breast tissue is heterogeneously
dense, which may obscure small masses.
FINDINGS: There are no findings suspicious for malignancy. Images were
processed with CAD.
IMPRESSION: No mammographic evidence of malignancy. A result letter of this
screening mammogram will be mailed directly to the patient.

RECOMMENDATION:
Screening mammogram in one year. (Code:[id number])

BI-RADS CATEGORY  1: Negative.

## 2018-04-26 ENCOUNTER — Encounter: Payer: Self-pay | Admitting: Family Medicine

## 2018-04-26 ENCOUNTER — Ambulatory Visit
Admission: RE | Admit: 2018-04-26 | Discharge: 2018-04-26 | Disposition: A | Discharge status: Home / Self Care | Payer: No Typology Code available for payment source | Source: Ambulatory Visit | Attending: Family Medicine | Admitting: Family Medicine

## 2018-04-26 ENCOUNTER — Other Ambulatory Visit
Admission: RE | Admit: 2018-04-26 | Discharge: 2018-04-26 | Disposition: A | Discharge status: Home / Self Care | Payer: No Typology Code available for payment source | Source: Ambulatory Visit | Attending: Family Medicine | Admitting: Family Medicine

## 2018-04-26 ENCOUNTER — Ambulatory Visit (INDEPENDENT_AMBULATORY_CARE_PROVIDER_SITE_OTHER): Payer: No Typology Code available for payment source | Admitting: Family Medicine

## 2018-04-26 VITALS — BP 112/74 | HR 79 | Temp 98.6°F | Resp 16 | Ht 65.5 in | Wt 197.9 lb

## 2018-04-26 DIAGNOSIS — R1011 Right upper quadrant pain: Secondary | ICD-10-CM | POA: Diagnosis not present

## 2018-04-26 DIAGNOSIS — R1031 Right lower quadrant pain: Secondary | ICD-10-CM

## 2018-04-26 DIAGNOSIS — R3915 Urgency of urination: Secondary | ICD-10-CM

## 2018-04-26 DIAGNOSIS — I517 Cardiomegaly: Secondary | ICD-10-CM | POA: Insufficient documentation

## 2018-04-26 LAB — POCT URINALYSIS DIPSTICK
APPEARANCE: NORMAL
Bilirubin, UA: NEGATIVE
GLUCOSE UA: NEGATIVE
Ketones, UA: NEGATIVE
LEUKOCYTES UA: NEGATIVE
NITRITE UA: NEGATIVE
PROTEIN UA: NEGATIVE
RBC UA: NEGATIVE
Spec Grav, UA: 1.025 (ref 1.010–1.025)
Urobilinogen, UA: 0.2 E.U./dL
pH, UA: 6 (ref 5.0–8.0)

## 2018-04-26 LAB — COMPREHENSIVE METABOLIC PANEL
ALK PHOS: 46 U/L (ref 38–126)
ALT: 16 U/L (ref 0–44)
ANION GAP: 7 (ref 5–15)
AST: 17 U/L (ref 15–41)
Albumin: 4.6 g/dL (ref 3.5–5.0)
BILIRUBIN TOTAL: 0.9 mg/dL (ref 0.3–1.2)
BUN: 21 mg/dL — ABNORMAL HIGH (ref 6–20)
CALCIUM: 9.5 mg/dL (ref 8.9–10.3)
CO2: 26 mmol/L (ref 22–32)
Chloride: 107 mmol/L (ref 98–111)
Creatinine, Ser: 1.11 mg/dL — ABNORMAL HIGH (ref 0.44–1.00)
GFR calc Af Amer: >60 mL/min (ref >60)
GFR calc non Af Amer: 57 mL/min — ABNORMAL LOW (ref >60)
GLUCOSE: 99 mg/dL (ref 70–99)
POTASSIUM: 3.6 mmol/L (ref 3.5–5.1)
Sodium: 140 mmol/L (ref 135–145)
TOTAL PROTEIN: 7.3 g/dL (ref 6.5–8.1)

## 2018-04-26 LAB — CBC WITH DIFFERENTIAL/PLATELET
BASOS ABS: 0 10*3/uL (ref 0–0.1)
BASOS PCT: 1 %
EOS PCT: 2 %
Eosinophils Absolute: 0.1 10*3/uL (ref 0–0.7)
HCT: 39.6 % (ref 35.0–47.0)
Hemoglobin: 13.7 g/dL (ref 12.0–16.0)
LYMPHS PCT: 27 %
Lymphs Abs: 1.7 10*3/uL (ref 1.0–3.6)
MCH: 32 pg (ref 26.0–34.0)
MCHC: 34.6 g/dL (ref 32.0–36.0)
MCV: 92.7 fL (ref 80.0–100.0)
MONO ABS: 0.4 10*3/uL (ref 0.2–0.9)
Monocytes Relative: 6 %
NEUTROS ABS: 4 10*3/uL (ref 1.4–6.5)
Neutrophils Relative %: 64 %
PLATELETS: 231 10*3/uL (ref 150–440)
RBC: 4.27 MIL/uL (ref 3.80–5.20)
RDW: 13.6 % (ref 11.5–14.5)
WBC: 6.2 10*3/uL (ref 3.6–11.0)

## 2018-04-26 MED ORDER — IOHEXOL 300 MG/ML  SOLN
100.0000 mL | Freq: Once | INTRAMUSCULAR | Status: AC | PRN
  Administered 2018-04-26: 100 mL via INTRAVENOUS

## 2018-04-26 NOTE — Patient Instructions (Signed)
We'll get the labs and the CT scan done this afternoon Stay there in radiology until we let you know the results If for some reason we get separated, I will try to reach you at [phone] If you do not hear from me within 30 minutes after your scan, call our office or the hospital operator and have me paged If you start to feel worse, go right to the ER

## 2018-04-26 NOTE — Progress Notes (Signed)
BP 112/74   Pulse 79   Temp 98.6 F (37 C) (Oral)   Resp 16   Ht 5' 5.5" (1.664 m)   Wt 197 lb 14.4 oz (89.8 kg)   SpO2 95%   BMI 32.43 kg/m    Subjective:    Patient ID: Emma Rogers, female    DOB: 21-Jan-1966, 52 y.o.   MRN: [id number]  HPI: Emma Rogers is a 52 y.o. female  Chief Complaint  Patient presents with  . Urinary Tract Infection    HPI Over the weekend, started to have urinary frequency, urgency She has discomfort, feels bloated and uncomfortable; really more around the umilibus; hurting now on the right side, "just uncomfortable"; not in dire pain; worse when standing up or seems to be Heaviness in the lower abdomen No dysuria This is a common thing she says, with the feeling of the urgency or frequency; feels that way a lot; that is not new Started feeling bloated and pressure over the weekend She was in excruciating pain on Saturday; just breathed; thought she was going to have a bowel movement but never did; normal for her IBS; both right and left, just hurting, hard to recall No hx of kidney stone; still has appendix and gallbladder Going to the bathoom, not quite like normal (BM), usually has a normal morning BM; these have been smaller and more sporadic, but bowels are moving Urine reviewed, normal No appetite; hasn't eaten all day; nausea; she recalls that it hurt around her belly button, deep and then went away, then back, and now RLQ No fevers Never diagnosed with diverticulitis or diverticulosis, mother and grandmother have had those She had a colonoscopy; has a spastic colon; has IBS; always more diarrhea than constipation She has been dealing with this all weekend  Depression screen [facility] 2/9 04/26/2018 04/26/2018 01/04/2018 12/07/2017 10/09/2017  Decreased Interest 0 0 0 0 1  Down, Depressed, Hopeless 0 0 0 0 1  PHQ - 2 Score 0 0 0 0 2  Altered sleeping 0 - - - 3  Tired, decreased energy 3 - - - 3  Change in appetite 2 - - - 1  Feeling bad or  failure about yourself  0 - - - 1  Trouble concentrating 0 - - - 2  Moving slowly or fidgety/restless 0 - - - 0  Suicidal thoughts 0 - - - 0  PHQ-9 Score 5 - - - 12  Difficult doing work/chores Not difficult at all - - - Very difficult    Relevant past medical, surgical, family and social history reviewed Past Medical History:  Diagnosis Date  . Allergic rhinitis   . Asthma   . Depression   . Headache(784.0)   . Renal insufficiency    Stage 3 kidney disease.    Past Surgical History:  Procedure Laterality Date  . ABDOMINAL HYSTERECTOMY  2009   still has 1 ovary  . BREAST BIOPSY Left 09/30/2002   Ductogram-neg  . INSERT / REPLACE / REMOVE PACEMAKER     Permanent pacemaker, Dr. [name]  . Lobe hemithroidectomy     Right   Family History  Problem Relation Age of Onset  . Heart disease Father        pacemaker,arrythmias  . Asthma Other   . Coronary artery disease Other        Female 1st degree relative <50  . Lung cancer Other   . Prostate cancer Other  1st degree relative <50  . COPD Maternal Grandmother   . Breast cancer Maternal Grandmother   . Heart disease Maternal Grandfather   . Liver disease Paternal Grandmother   . Heart disease Paternal Grandfather    Social History   Tobacco Use  . Smoking status: Never Smoker  . Smokeless tobacco: Never Used  Substance Use Topics  . Alcohol use: No    Alcohol/week: 0.0 oz  . Drug use: No    Interim medical history since last visit reviewed. Allergies and medications reviewed  Review of Systems Per HPI unless specifically indicated above     Objective:    BP 112/74   Pulse 79   Temp 98.6 F (37 C) (Oral)   Resp 16   Ht 5' 5.5" (1.664 m)   Wt 197 lb 14.4 oz (89.8 kg)   SpO2 95%   BMI 32.43 kg/m   Wt Readings from Last 3 Encounters:  04/26/18 197 lb 14.4 oz (89.8 kg)  04/13/18 203 lb (92.1 kg)  04/05/18 204 lb 8 oz (92.8 kg)    Physical Exam  Constitutional: She appears well-developed and  well-nourished. No distress.  HENT:  Head: Normocephalic and atraumatic.  Eyes: EOM are normal. No scleral icterus.  Neck: No thyromegaly present.  Cardiovascular: Normal rate, regular rhythm and normal heart sounds.  No murmur heard. Pulmonary/Chest: Effort normal and breath sounds normal. No respiratory distress. She has no wheezes.  Abdominal: Soft. Bowel sounds are normal. She exhibits no distension. There is no hepatosplenomegaly. There is tenderness in the right upper quadrant and left upper quadrant. There is guarding and tenderness at McBurney's point. There is no rigidity, no rebound and negative Murphy's sign. No hernia.  Musculoskeletal: She exhibits no edema.  Neurological: She is alert.  Skin: Skin is warm and dry. She is not diaphoretic. No pallor.  Psychiatric: She has a normal mood and affect. Her behavior is normal. Judgment and thought content normal.    Results for orders placed or performed in visit on 04/26/18  POCT Urinalysis Dipstick  Result Value Ref Range   Color, UA yellow    Clarity, UA clear    Glucose, UA Negative Negative   Bilirubin, UA Negative    Ketones, UA Negative    Spec Grav, UA 1.025 1.010 - 1.025   Blood, UA Negative    pH, UA 6.0 5.0 - 8.0   Protein, UA Negative Negative   Urobilinogen, UA 0.2 0.2 or 1.0 E.U./dL   Nitrite, UA negative    Leukocytes, UA Negative Negative   Appearance normal    Odor no       Assessment & Plan:   Problem List Items Addressed This Visit    None    Visit Diagnoses    Urinary urgency    -  Primary   urine reviewed today; does not appear to be causal   Relevant Orders   POCT Urinalysis Dipstick (Completed)   Right lower quadrant abdominal pain       ddx includes acute appendicitis, esp w/periumbilical pain earlier; will get stat CBC, CMP, CT scan; to ER if worse   Relevant Orders   CT Abdomen Pelvis W Contrast   Abdominal pain, right upper quadrant       more pain on exam with RLQ palpation than RUQ  palpation; however, acute cholecystitis is in the ddx   Relevant Orders   CT Abdomen Pelvis W Contrast       Follow up plan: No follow-ups  on file.  An after-visit summary was printed and given to the patient at check-out.  Please see the patient instructions which may contain other information and recommendations beyond what is mentioned above in the assessment and plan.  No orders of the defined types were placed in this encounter.   Orders Placed This Encounter  Procedures  . CT Abdomen Pelvis W Contrast  . POCT Urinalysis Dipstick

## 2018-04-27 ENCOUNTER — Encounter: Payer: Self-pay | Admitting: Family Medicine

## 2018-04-27 DIAGNOSIS — R1011 Right upper quadrant pain: Secondary | ICD-10-CM

## 2018-04-27 DIAGNOSIS — R1031 Right lower quadrant pain: Secondary | ICD-10-CM

## 2018-05-05 ENCOUNTER — Encounter: Admission: RE | Admit: 2018-05-05 | Payer: No Typology Code available for payment source | Source: Ambulatory Visit

## 2018-05-05 ENCOUNTER — Telehealth: Payer: Self-pay | Admitting: Family Medicine

## 2018-05-05 NOTE — Telephone Encounter (Signed)
Pt called states could not  Get away from work will call to reschedule

## 2018-05-05 NOTE — Telephone Encounter (Signed)
Copied from CRM [phone]. Topic: Quick Communication - See Telephone Encounter >> May 05, 2018 10:04 AM [name], Jessica L wrote: CRM for notification. See Telephone encounter for: 05/05/18. [facility] and stated that pt did not show up for study of gallbladder. Please advise

## 2018-06-02 ENCOUNTER — Ambulatory Visit (INDEPENDENT_AMBULATORY_CARE_PROVIDER_SITE_OTHER): Payer: No Typology Code available for payment source | Admitting: Nurse Practitioner

## 2018-06-02 ENCOUNTER — Encounter: Payer: Self-pay | Admitting: Nurse Practitioner

## 2018-06-02 VITALS — BP 110/80 | HR 104 | Temp 99.0°F | Resp 16 | Ht 65.5 in | Wt 189.4 lb

## 2018-06-02 DIAGNOSIS — F32A Depression, unspecified: Secondary | ICD-10-CM

## 2018-06-02 DIAGNOSIS — F419 Anxiety disorder, unspecified: Secondary | ICD-10-CM

## 2018-06-02 DIAGNOSIS — Z630 Problems in relationship with spouse or partner: Secondary | ICD-10-CM

## 2018-06-02 DIAGNOSIS — F329 Major depressive disorder, single episode, unspecified: Secondary | ICD-10-CM | POA: Diagnosis not present

## 2018-06-02 MED ORDER — HYDROXYZINE PAMOATE 25 MG PO CAPS: 25.0000 mg | ORAL_CAPSULE | Freq: Three times a day (TID) | ORAL | 0 refills | Status: DC | PRN

## 2018-06-02 NOTE — Patient Instructions (Signed)
[email or web address]  -[facility] in [place]
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
## 2018-06-02 NOTE — Addendum Note (Signed)
Addended by: [name] on: 06/02/2018 03:34 PM   Modules accepted: Orders

## 2018-06-08 ENCOUNTER — Ambulatory Visit: Payer: No Typology Code available for payment source | Admitting: Nurse Practitioner

## 2018-06-08 ENCOUNTER — Ambulatory Visit (INDEPENDENT_AMBULATORY_CARE_PROVIDER_SITE_OTHER): Payer: No Typology Code available for payment source | Admitting: Nurse Practitioner

## 2018-06-08 ENCOUNTER — Encounter: Payer: Self-pay | Admitting: Nurse Practitioner

## 2018-06-08 VITALS — BP 120/80 | HR 78 | Temp 98.8°F | Resp 16 | Ht 65.5 in | Wt 193.7 lb

## 2018-06-08 DIAGNOSIS — F32A Depression, unspecified: Secondary | ICD-10-CM

## 2018-06-08 DIAGNOSIS — F419 Anxiety disorder, unspecified: Secondary | ICD-10-CM | POA: Diagnosis not present

## 2018-06-08 DIAGNOSIS — F329 Major depressive disorder, single episode, unspecified: Secondary | ICD-10-CM

## 2018-06-08 NOTE — Patient Instructions (Addendum)
Online Counseling Options: [email or web address]/[email or web address]   [email or web address]
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
## 2018-06-11 ENCOUNTER — Ambulatory Visit: Payer: No Typology Code available for payment source | Admitting: Gastroenterology

## 2018-06-11 ENCOUNTER — Encounter: Payer: Self-pay | Admitting: Gastroenterology

## 2018-06-11 DIAGNOSIS — R1011 Right upper quadrant pain: Secondary | ICD-10-CM

## 2018-06-11 IMAGING — US US EXTREM LOW VENOUS BILAT
1 series · 13 of 24 positions shown · non-contrast
Comparison: None.

CLINICAL DATA: Elevated D-dimer, chest pain



[Series 1: us extrem low venous bilat · 0.08mm/px · 13 of 70 slices shown]
[im 1/70]
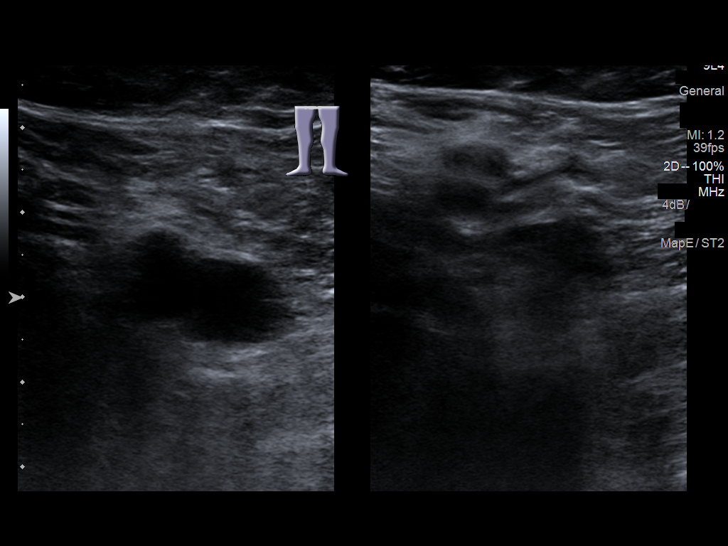
[im 7/70]
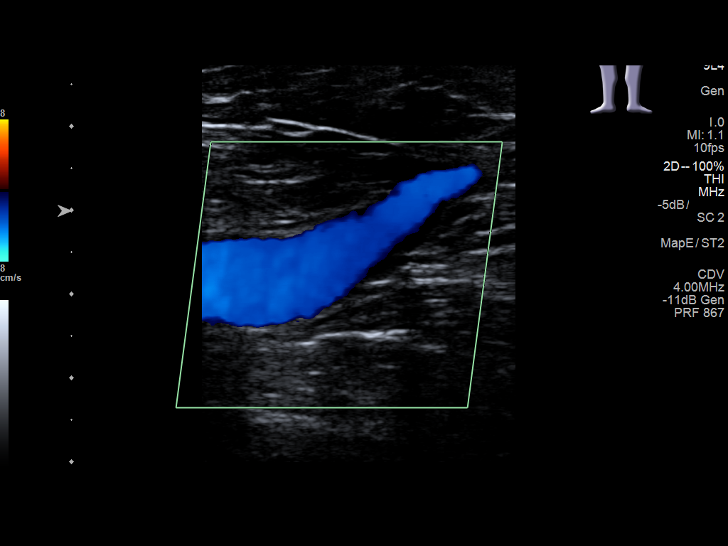
[im 13/70]
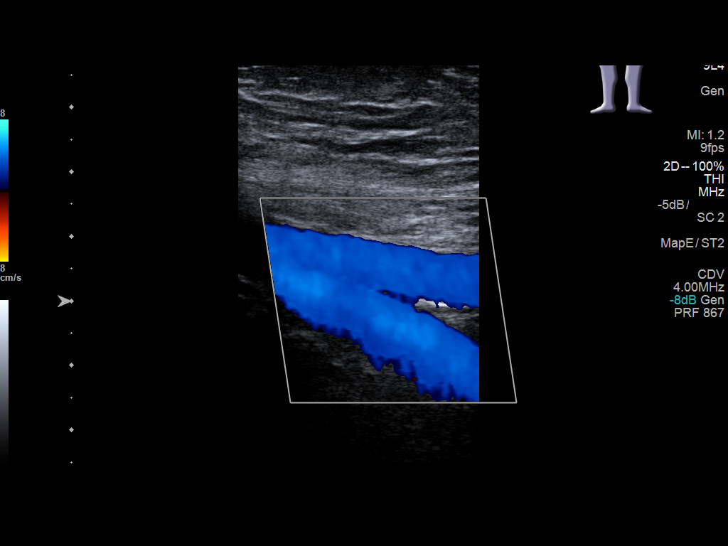
[im 19/70]
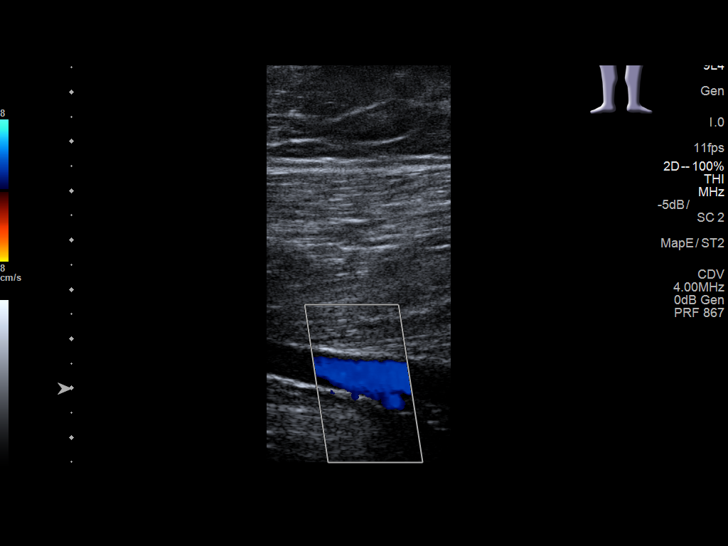
[im 25/70]
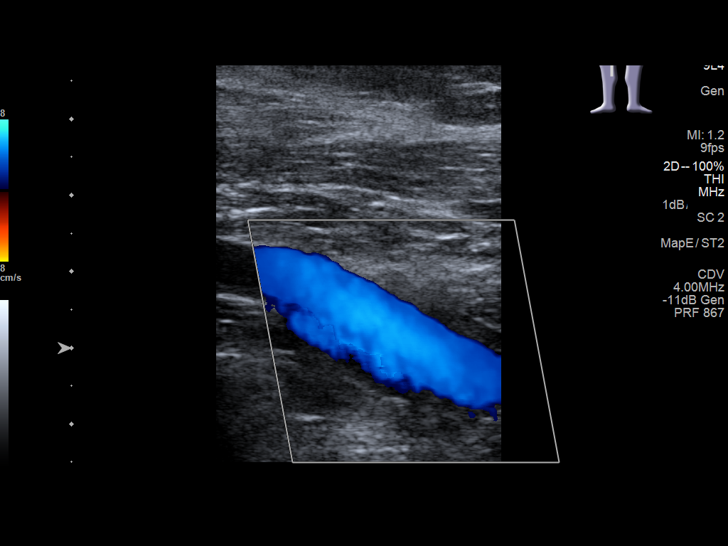
[im 31/70]
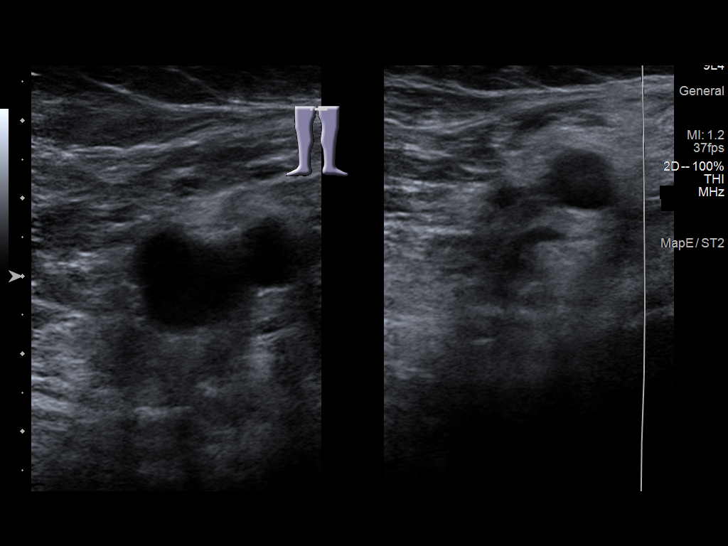
[im 37/70]
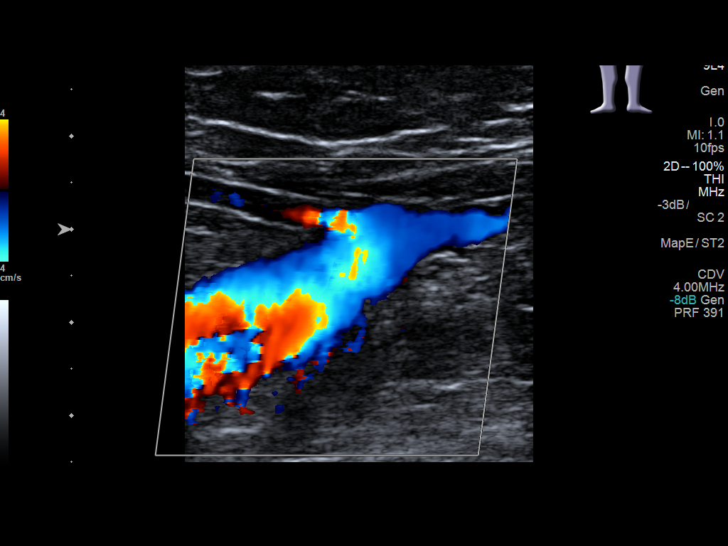
[im 40/70]
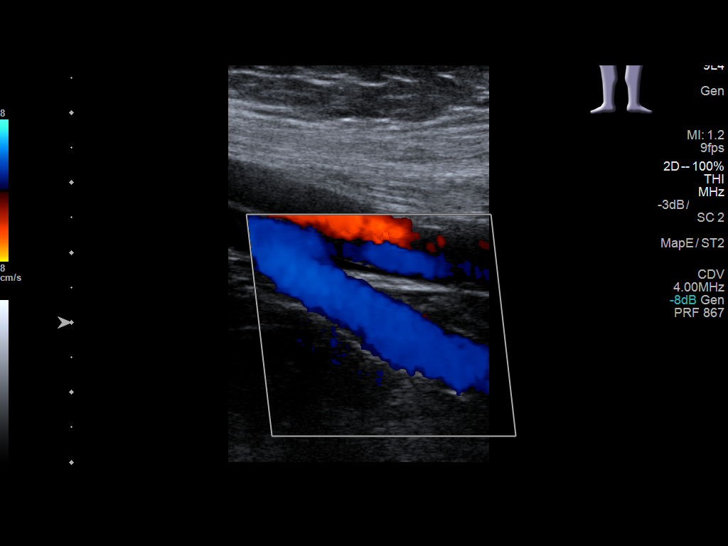
[im 46/70]
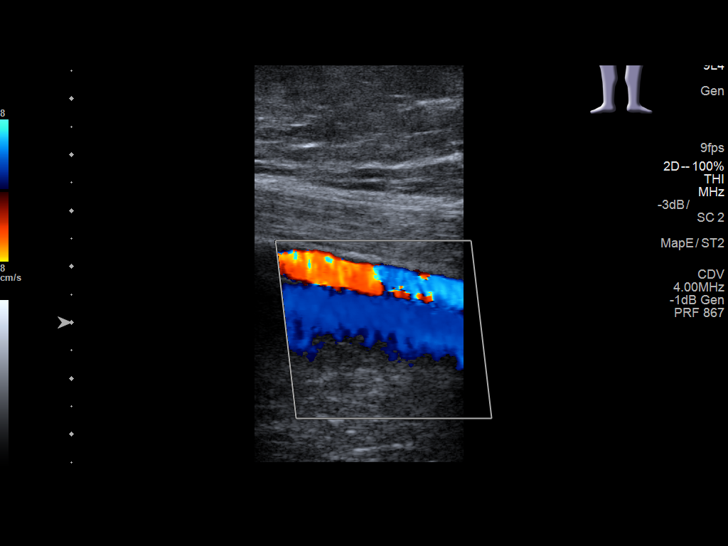
[im 52/70]
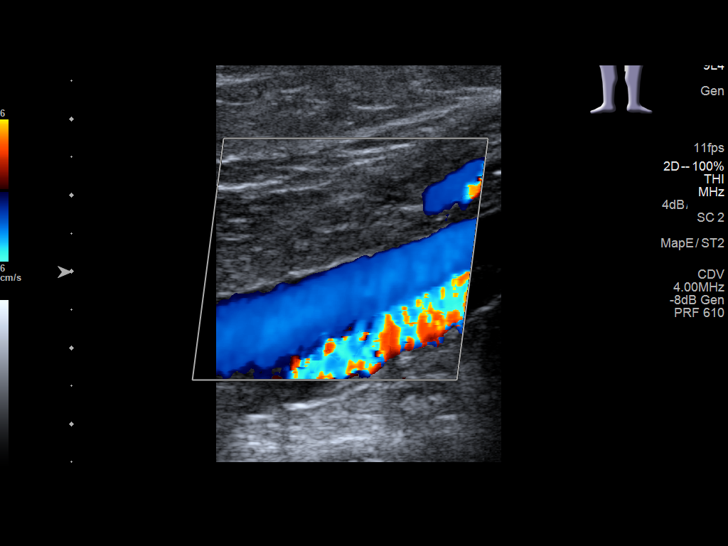
[im 58/70]
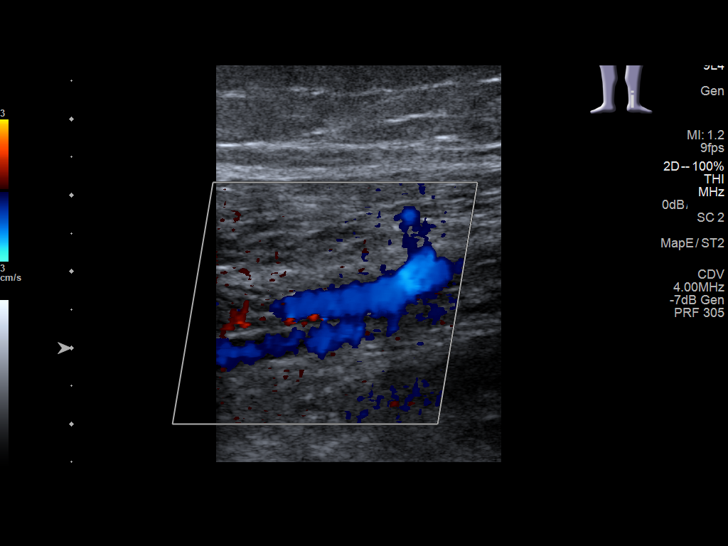
[im 64/70]
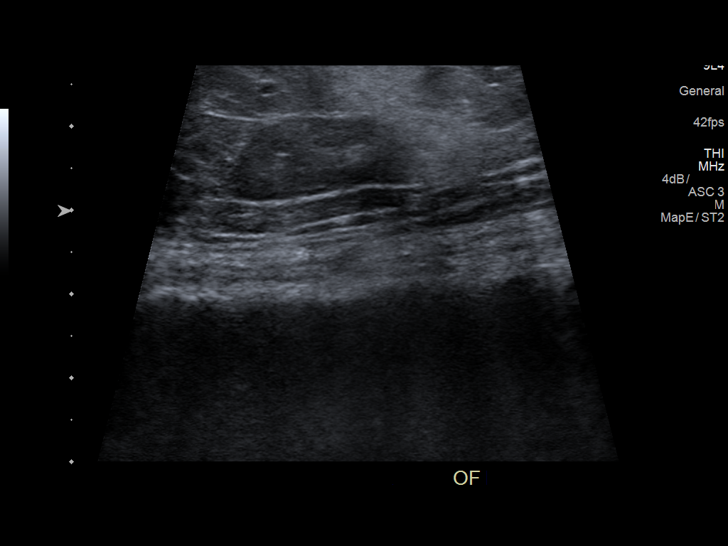
[im 70/70]
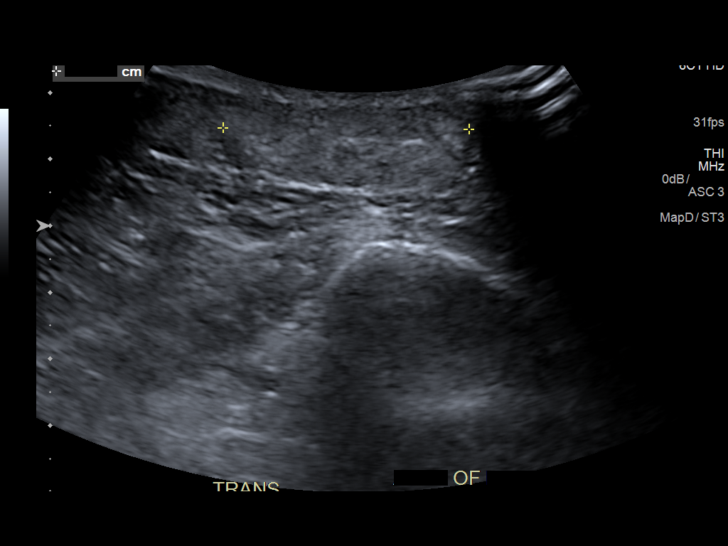

[13 of 24 positions shown; findings below may reference images not displayed]

FINDINGS: RIGHT LOWER EXTREMITY

Common Femoral Vein: No evidence of thrombus. Normal
compressibility, respiratory phasicity and response to augmentation.

Saphenofemoral Junction: No evidence of thrombus. Normal
compressibility and flow on color Doppler imaging.

Profunda Femoral Vein: No evidence of thrombus. Normal
compressibility and flow on color Doppler imaging.

Femoral Vein: No evidence of thrombus. Normal compressibility,
respiratory phasicity and response to augmentation.

Popliteal Vein: No evidence of thrombus. Normal compressibility,
respiratory phasicity and response to augmentation.

Calf Veins: No evidence of thrombus. Normal compressibility and flow
on color Doppler imaging.

Superficial Great Saphenous Vein: No evidence of thrombus. Normal
compressibility and flow on color Doppler imaging.

Venous Reflux:  None.

Other Findings:  None.

LEFT LOWER EXTREMITY

Common Femoral Vein: No evidence of thrombus. Normal
compressibility, respiratory phasicity and response to augmentation.

Saphenofemoral Junction: No evidence of thrombus. Normal
compressibility and flow on color Doppler imaging.

Profunda Femoral Vein: No evidence of thrombus. Normal
compressibility and flow on color Doppler imaging.

Femoral Vein: No evidence of thrombus. Normal compressibility,
respiratory phasicity and response to augmentation.

Popliteal Vein: No evidence of thrombus. Normal compressibility,
respiratory phasicity and response to augmentation.

Calf Veins: No evidence of thrombus. Normal compressibility and flow
on color Doppler imaging.

Superficial Great Saphenous Vein: No evidence of thrombus. Normal
compressibility and flow on color Doppler imaging.

Venous Reflux:  None.

Other Findings:  None.
IMPRESSION: No evidence of DVT within either lower extremity.

## 2018-07-12 ENCOUNTER — Encounter: Payer: Self-pay | Admitting: Family Medicine

## 2018-07-12 ENCOUNTER — Encounter: Payer: Self-pay | Admitting: Emergency Medicine

## 2018-07-12 ENCOUNTER — Ambulatory Visit (INDEPENDENT_AMBULATORY_CARE_PROVIDER_SITE_OTHER): Payer: No Typology Code available for payment source | Admitting: Family Medicine

## 2018-07-12 VITALS — BP 116/72 | HR 94 | Temp 98.7°F | Resp 16 | Ht 66.0 in | Wt 184.5 lb

## 2018-07-12 DIAGNOSIS — J9801 Acute bronchospasm: Secondary | ICD-10-CM

## 2018-07-12 DIAGNOSIS — J0140 Acute pansinusitis, unspecified: Secondary | ICD-10-CM | POA: Diagnosis not present

## 2018-07-12 MED ORDER — BENZONATATE 100 MG PO CAPS: 100.0000 mg | ORAL_CAPSULE | Freq: Three times a day (TID) | ORAL | 0 refills | Status: DC | PRN

## 2018-07-12 MED ORDER — GUAIFENESIN ER 600 MG PO TB12: 600.0000 mg | ORAL_TABLET | Freq: Two times a day (BID) | ORAL | 0 refills | Status: DC

## 2018-07-12 MED ORDER — ALBUTEROL SULFATE 108 (90 BASE) MCG/ACT IN AEPB: 1.0000 | INHALATION_SPRAY | RESPIRATORY_TRACT | 2 refills | Status: DC | PRN

## 2018-07-12 MED ORDER — DOXYCYCLINE HYCLATE 100 MG PO TABS: 100.0000 mg | ORAL_TABLET | Freq: Two times a day (BID) | ORAL | 0 refills | Status: AC

## 2018-07-12 NOTE — Progress Notes (Signed)
Name: Emma Rogers   MRN: [id number]    DOB: [birth year]   Date:07/12/2018       Progress Note  Subjective  Chief Complaint  Chief Complaint  Patient presents with  . URI    cough, head pressure, earache, sore throat for 2 weeks   HPI  Pt presents with >1 week history of URI, she had URI 07/01/18 that seemed to improve later in the week.  Then on Saturday (3 days ago) she woke up with a headache and sore throat again.  She notes now she has ongoing frontal/sinus headache and sore throat, hoarse voice, mild nausea, dry cough (with some bronchospasm).  Denies vomiting, diarrhea, abdominal pain, chest pain.  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  Was taking dayquil/nyquil with minimal relief.  Patient Active Problem List   Diagnosis Date Noted  . Chronic venous insufficiency 03/10/2018  . Lymphedema 03/10/2018  . Elevated blood pressure reading 10/13/2017  . Anxiety 10/13/2017  . Familial hyperlipidemia 10/10/2017  . Bilateral leg edema 09/03/2017  . Depression 09/03/2017  . Chronic kidney disease, stage III (moderate) (HCC) 09/03/2017  . Vitamin D deficiency 09/03/2017  . Vitamin B12 deficiency 09/03/2017  . Medication monitoring encounter 09/03/2017  . Insomnia 08/11/2017  . Menopausal symptoms 08/11/2017  . Shortness of breath 05/18/2017  . Chest pain 03/31/2017  . Migraine without aura and without status migrainosus, not intractable 02/21/2017  . Prediabetes 02/20/2017  . Fatigue 02/20/2017  . Obesity (BMI 35.0-39.9 without comorbidity) 02/20/2017  . 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 VASOVAGAL SYNCOPE 11/27/2009  . Allergic rhinitis 01/18/2008  . HEADACHE 01/18/2008    Social History   Tobacco Use  . Smoking status: Never Smoker  . Smokeless tobacco: Never Used  Substance Use Topics  . Alcohol use: No    Alcohol/week: 0.0 standard drinks    Current Outpatient Medications:  .  albuterol (PROVENTIL  HFA;VENTOLIN HFA) 108 (90 Base) MCG/ACT inhaler, Inhale 1-2 puffs into the lungs every 6 (six) hours as needed for wheezing or shortness of breath., Disp: 1 Inhaler, Rfl: 0 .  atorvastatin (LIPITOR) 80 MG tablet, Take 1 tablet (80 mg total) by mouth daily., Disp: 30 tablet, Rfl: 1 .  azelastine (OPTIVAR) 0.05 % ophthalmic solution, Place 1 drop every morning into both eyes., Disp: , Rfl: 3 .  BREO ELLIPTA 200-25 MCG/INH AEPB, Inhale 1 puff every morning into the lungs., Disp: , Rfl: 3 .  buPROPion (WELLBUTRIN XL) 150 MG 24 hr tablet, TAKE 2 TABLETS BY MOUTH DAILY., Disp: 60 tablet, Rfl: 2 .  furosemide (LASIX) 20 MG tablet, , Disp: , Rfl: 3 .  hydrOXYzine (VISTARIL) 25 MG capsule, Take 1 capsule (25 mg total) by mouth 3 (three) times daily as needed., Disp: 30 capsule, Rfl: 0 .  levocetirizine (XYZAL) 5 MG tablet, Take 5 mg daily by mouth., Disp: , Rfl: 3 .  montelukast (SINGULAIR) 10 MG tablet, TAKE 1 TABLET (10 MG TOTAL) BY MOUTH AT BEDTIME., Disp: 30 tablet, Rfl: 11 .  rizatriptan (MAXALT) 10 MG tablet, Take 1 tablet (10 mg total) by mouth as needed for migraine. May repeat in 2 hours if needed, Disp: 10 tablet, Rfl: 12 .  topiramate (TOPAMAX) 100 MG tablet, TAKE 1 TABLET BY MOUTH DAILY, Disp: 30 tablet, Rfl: 5 .  traZODone (DESYREL) 100 MG tablet, Take 1 tablet (100 mg total) by mouth at bedtime as needed for sleep., Disp: 30 tablet, Rfl: 5  Current  Facility-Administered Medications:  .  cyanocobalamin ((VITAMIN B-12)) injection 1,000 mcg, 1,000 mcg, Intramuscular, Q30 days, [name], [name], MD, 1,000 mcg at 01/19/18 0840  Allergies  Allergen Reactions  . Amoxicillin Other (See Comments)  . Clavulanic Acid Other (See Comments)  . Morphine And Related     vomiting    I personally reviewed active problem list, medication list, allergies, lab results with the patient/caregiver today.  ROS  Ten systems reviewed and is negative except as mentioned in HPI  Objective  Vitals:   07/12/18  1132  BP: 116/72  Pulse: 94  Resp: 16  Temp: 98.7 F (37.1 C)  TempSrc: Oral  SpO2: 99%  Weight: 184 lb 8 oz (83.7 kg)  Height: 5\' 6"  (1.676 m)   Body mass index is 29.78 kg/m.  Nursing Note and Vital Signs reviewed.  Physical Exam  Constitutional: Patient appears well-developed and well-nourished. Obese No distress.  HEENT: head atraumatic, normocephalic, pupils equal and reactive to light, Bilateral TM's without erythema or effusion,  bilateral maxillary and frontal sinuses are tender, neck supple with mild submandibular lymphadenopathy, throat within normal limits - no erythema or exudate, no tonsillar swelling Cardiovascular: Normal rate, regular rhythm and normal heart sounds.  No murmur heard. No BLE edema. Pulmonary/Chest: Effort normal and breath sounds clear bilaterally. No respiratory distress. Abdominal: Soft, bowel sounds normal, there is no tenderness, no HSM Psychiatric: Patient has a normal mood and affect. behavior is normal. Judgment and thought content normal.  No results found for this or any previous visit (from the past 72 hour(s)).  Assessment & Plan  1. Acute non-recurrent pansinusitis - guaiFENesin (MUCINEX) 600 MG 12 hr tablet; Take 1 tablet (600 mg total) by mouth 2 (two) times daily.  Dispense: 20 tablet; Refill: 0 - benzonatate (TESSALON) 100 MG capsule; Take 1-2 capsules (100-200 mg total) by mouth 3 (three) times daily as needed.  Dispense: 30 capsule; Refill: 0 - doxycycline (VIBRA-TABS) 100 MG tablet; Take 1 tablet (100 mg total) by mouth 2 (two) times daily for 10 days.  Dispense: 20 tablet; Refill: 0  2. Bronchospasm - Albuterol Sulfate (PROAIR RESPICLICK) 108 (90 Base) MCG/ACT AEPB; Inhale 1-2 puffs into the lungs every 4 (four) hours as needed.  Dispense: 1 each; Refill: 2  -Red flags and when to present for emergency care or RTC including fever >101.73F, chest pain, shortness of breath, new/worsening/un-resolving symptoms, ocular swelling or  pain with ocular movement. reviewed with patient at time of visit. Follow up and care instructions discussed and provided in AVS.

## 2018-07-12 NOTE — Patient Instructions (Signed)

## 2018-08-17 ENCOUNTER — Ambulatory Visit (INDEPENDENT_AMBULATORY_CARE_PROVIDER_SITE_OTHER): Payer: No Typology Code available for payment source | Admitting: Nurse Practitioner

## 2018-08-17 ENCOUNTER — Encounter: Payer: Self-pay | Admitting: Nurse Practitioner

## 2018-08-17 VITALS — BP 120/80 | HR 67 | Temp 98.4°F | Resp 16 | Ht 66.0 in | Wt 181.6 lb

## 2018-08-17 DIAGNOSIS — M542 Cervicalgia: Secondary | ICD-10-CM

## 2018-08-17 DIAGNOSIS — G43009 Migraine without aura, not intractable, without status migrainosus: Secondary | ICD-10-CM

## 2018-08-17 DIAGNOSIS — F329 Major depressive disorder, single episode, unspecified: Secondary | ICD-10-CM

## 2018-08-17 DIAGNOSIS — F419 Anxiety disorder, unspecified: Secondary | ICD-10-CM | POA: Diagnosis not present

## 2018-08-17 DIAGNOSIS — F32A Depression, unspecified: Secondary | ICD-10-CM

## 2018-08-17 MED ORDER — TOPIRAMATE 100 MG PO TABS: 100.0000 mg | ORAL_TABLET | Freq: Every day | ORAL | 5 refills | Status: DC

## 2018-08-17 MED ORDER — RIZATRIPTAN BENZOATE 10 MG PO TABS: 10.0000 mg | ORAL_TABLET | ORAL | 1 refills | Status: DC | PRN

## 2018-08-17 MED ORDER — TIZANIDINE HCL 4 MG PO TABS: 4.0000 mg | ORAL_TABLET | Freq: Four times a day (QID) | ORAL | 0 refills | Status: DC | PRN

## 2018-08-17 MED ORDER — ESCITALOPRAM OXALATE 5 MG PO TABS: 5.0000 mg | ORAL_TABLET | Freq: Every day | ORAL | 1 refills | Status: DC

## 2018-08-17 NOTE — Progress Notes (Signed)
Name: Emma Rogers   MRN: [id number]    DOB: 10-20-1965   Date:08/17/2018       Progress Note  Subjective  Chief Complaint  Chief Complaint  Patient presents with  . Depression  . neck problem    HPI  Patient endorses increased stress in life- dad has been in ICU and boss has breast cancer, marital issues. Notes increased depression. Good family and friend support.  States right neck tightness caused some worsening headaches. States pain started Monday or Tuesday and then headache started Thursday- taking topamax daily, had to take rizatriptan last night with relief of symptoms.   Does not think vistaril is working, tried buspar- but was having trouble sleeping.   Depression screen [facility] 2/9 08/17/2018 07/12/2018 06/08/2018 06/02/2018 06/02/2018  Decreased Interest 2 1 1 1 1   Down, Depressed, Hopeless 2 1 1 1 1   PHQ - 2 Score 4 2 2 2 2   Altered sleeping 2 1 2 2 2   Tired, decreased energy 2 1 3 3 3   Change in appetite 2 1 2 3 3   Feeling bad or failure about yourself  1 1 3 3 3   Trouble concentrating 2 1 2 3 3   Moving slowly or fidgety/restless 1 1 1 1 1   Suicidal thoughts 0 0 0 0 0  PHQ-9 Score 14 8 15 17 17   Difficult doing work/chores Somewhat difficult Somewhat difficult Somewhat difficult Somewhat difficult Somewhat difficult  Some recent data might be hidden     Patient Active Problem List   Diagnosis Date Noted  . Chronic venous insufficiency 03/10/2018  . Lymphedema 03/10/2018  . Elevated blood pressure reading 10/13/2017  . Anxiety 10/13/2017  . Familial hyperlipidemia 10/10/2017  . Bilateral leg edema 09/03/2017  . Depression 09/03/2017  . Chronic kidney disease, stage III (moderate) (HCC) 09/03/2017  . Vitamin D deficiency 09/03/2017  . Vitamin B12 deficiency 09/03/2017  . Medication monitoring encounter 09/03/2017  . Insomnia 08/11/2017  . Menopausal symptoms 08/11/2017  . Shortness of breath 05/18/2017  . Chest pain 03/31/2017  . Migraine without aura and  without status migrainosus, not intractable 02/21/2017  . Prediabetes 02/20/2017  . Fatigue 02/20/2017  . Obesity (BMI 35.0-39.9 without comorbidity) 02/20/2017  . 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 VASOVAGAL SYNCOPE 11/27/2009  . Allergic rhinitis 01/18/2008  . HEADACHE 01/18/2008    Past Medical History:  Diagnosis Date  . Allergic rhinitis   . Asthma   . Depression   . Headache(784.0)   . Renal insufficiency    Stage 3 kidney disease.     Past Surgical History:  Procedure Laterality Date  . ABDOMINAL HYSTERECTOMY  2009   still has 1 ovary  . BREAST BIOPSY Left 09/30/2002   Ductogram-neg  . INSERT / REPLACE / REMOVE PACEMAKER     Permanent pacemaker, Dr. [name]  . Lobe hemithroidectomy     Right    Social History   Tobacco Use  . Smoking status: Never Smoker  . Smokeless tobacco: Never Used  Substance Use Topics  . Alcohol use: No    Alcohol/week: 0.0 standard drinks     Current Outpatient Medications:  .  Albuterol Sulfate (PROAIR RESPICLICK) 108 (90 Base) MCG/ACT AEPB, Inhale 1-2 puffs into the lungs every 4 (four) hours as needed., Disp: 1 each, Rfl: 2 .  atorvastatin (LIPITOR) 80 MG tablet, Take 1 tablet (80 mg total) by mouth daily., Disp: 30 tablet, Rfl: 1 .  azelastine (OPTIVAR) 0.05 % ophthalmic solution, Place 1 drop every morning into  both eyes., Disp: , Rfl: 3 .  benzonatate (TESSALON) 100 MG capsule, Take 1-2 capsules (100-200 mg total) by mouth 3 (three) times daily as needed., Disp: 30 capsule, Rfl: 0 .  BREO ELLIPTA 200-25 MCG/INH AEPB, Inhale 1 puff every morning into the lungs., Disp: , Rfl: 3 .  buPROPion (WELLBUTRIN XL) 150 MG 24 hr tablet, TAKE 2 TABLETS BY MOUTH DAILY., Disp: 60 tablet, Rfl: 2 .  furosemide (LASIX) 20 MG tablet, , Disp: , Rfl: 3 .  guaiFENesin (MUCINEX) 600 MG 12 hr tablet, Take 1 tablet (600 mg total) by mouth 2 (two) times daily., Disp: 20 tablet, Rfl: 0 .  hydrOXYzine (VISTARIL) 25 MG capsule, Take 1 capsule (25 mg total) by mouth  3 (three) times daily as needed., Disp: 30 capsule, Rfl: 0 .  levocetirizine (XYZAL) 5 MG tablet, Take 5 mg daily by mouth., Disp: , Rfl: 3 .  montelukast (SINGULAIR) 10 MG tablet, TAKE 1 TABLET (10 MG TOTAL) BY MOUTH AT BEDTIME., Disp: 30 tablet, Rfl: 11 .  rizatriptan (MAXALT) 10 MG tablet, Take 1 tablet (10 mg total) by mouth as needed for migraine. May repeat in 2 hours if needed, Disp: 10 tablet, Rfl: 12 .  topiramate (TOPAMAX) 100 MG tablet, TAKE 1 TABLET BY MOUTH DAILY, Disp: 30 tablet, Rfl: 5 .  traZODone (DESYREL) 100 MG tablet, Take 1 tablet (100 mg total) by mouth at bedtime as needed for sleep., Disp: 30 tablet, Rfl: 5  Current Facility-Administered Medications:  .  cyanocobalamin ((VITAMIN B-12)) injection 1,000 mcg, 1,000 mcg, Intramuscular, Q30 days, [name], Melinda P, MD, 1,000 mcg at 01/19/18 0840  Allergies  Allergen Reactions  . Amoxicillin Other (See Comments)  . Clavulanic Acid Other (See Comments)  . Morphine And Related     vomiting    ROS   No other specific complaints in a complete review of systems (except as listed in HPI above).  Objective  Vitals:   08/17/18 1035  BP: 120/80  Pulse: 67  Resp: 16  Temp: 98.4 F (36.9 C)  TempSrc: Oral  SpO2: 98%  Weight: 181 lb 9.6 oz (82.4 kg)  Height: 5\' 6"  (1.676 m)   Body mass index is 29.31 kg/m.  Nursing Note and Vital Signs reviewed.  Physical Exam  Constitutional: She is oriented to person, place, and time. She appears well-developed and well-nourished.  HENT:  Head: Normocephalic and atraumatic.  Nose: Nose normal. Right sinus exhibits no maxillary sinus tenderness and no frontal sinus tenderness. Left sinus exhibits no maxillary sinus tenderness and no frontal sinus tenderness.  Mouth/Throat: Uvula is midline and oropharynx is clear and moist.  Neck: Normal range of motion. Neck supple. Carotid bruit is not present.  Cardiovascular: Normal heart sounds and intact distal pulses.  Pulmonary/Chest:  Effort normal and breath sounds normal.  Abdominal: Soft. Bowel sounds are normal. There is no tenderness. There is no CVA tenderness.  Musculoskeletal: Normal range of motion.       Right shoulder: She exhibits tenderness. She exhibits normal range of motion, no bony tenderness, no swelling and no effusion.       Arms: Neurological: She is alert and oriented to person, place, and time. She has normal strength. No cranial nerve deficit or sensory deficit. GCS eye subscore is 4. GCS verbal subscore is 5. GCS motor subscore is 6.  Skin: Skin is warm, dry and intact. Capillary refill takes less than 2 seconds.  Psychiatric: She has a normal mood and affect. Her speech is normal  and behavior is normal. Judgment and thought content normal.  Vitals reviewed.     No results found for this or any previous visit (from the past 48 hour(s)).  Assessment & Plan  1. Depression, unspecified depression type Strongly urged to call back psychiatry  - escitalopram (LEXAPRO) 5 MG tablet; Take 1 tablet (5 mg total) by mouth daily.  Dispense: 30 tablet; Refill: 1  2. Anxiety - escitalopram (LEXAPRO) 5 MG tablet; Take 1 tablet (5 mg total) by mouth daily.  Dispense: 30 tablet; Refill: 1  3. Neck pain - tiZANidine (ZANAFLEX) 4 MG tablet; Take 1 tablet (4 mg total) by mouth every 6 (six) hours as needed for muscle spasms.  Dispense: 30 tablet; Refill: 0  4. Migraine without aura and without status migrainosus, not intractable - topiramate (TOPAMAX) 100 MG tablet; Take 1 tablet (100 mg total) by mouth daily.  Dispense: 30 tablet; Refill: 5 - rizatriptan (MAXALT) 10 MG tablet; Take 1 tablet (10 mg total) by mouth as needed for migraine. May repeat in 2 hours if needed  Dispense: 10 tablet; Refill: 1

## 2018-08-17 NOTE — Patient Instructions (Addendum)
-   Please drink plenty of water and try to have small snacks throughout the day  - Take muscle relaxer as needed, use heating pack.  - Try to find a time to see- psychiatry specialist; Please call.  - We are starting you on lexapro ( a low dose) please take daily for one month, we can also increase to 10mg  daily in 2 weeks if needed.

## 2018-08-20 ENCOUNTER — Other Ambulatory Visit: Payer: Self-pay | Admitting: Family Medicine

## 2018-09-17 ENCOUNTER — Other Ambulatory Visit: Payer: Self-pay | Admitting: Family Medicine

## 2018-09-20 ENCOUNTER — Encounter: Payer: Self-pay | Admitting: Family Medicine

## 2018-09-20 DIAGNOSIS — F329 Major depressive disorder, single episode, unspecified: Secondary | ICD-10-CM

## 2018-09-20 DIAGNOSIS — F32A Depression, unspecified: Secondary | ICD-10-CM

## 2018-09-27 ENCOUNTER — Ambulatory Visit: Payer: Self-pay | Admitting: Nurse Practitioner

## 2018-09-28 ENCOUNTER — Ambulatory Visit (INDEPENDENT_AMBULATORY_CARE_PROVIDER_SITE_OTHER): Payer: No Typology Code available for payment source | Admitting: Vascular Surgery

## 2018-10-05 ENCOUNTER — Encounter: Payer: Self-pay | Admitting: Family Medicine

## 2018-10-05 ENCOUNTER — Other Ambulatory Visit: Payer: Self-pay | Admitting: Family Medicine

## 2018-10-05 MED ORDER — BENZONATATE 100 MG PO CAPS: 100.0000 mg | ORAL_CAPSULE | Freq: Three times a day (TID) | ORAL | 0 refills | Status: DC | PRN

## 2018-10-05 NOTE — Progress Notes (Signed)
Patient would like tessalon perles for URI symptoms

## 2018-10-05 NOTE — Progress Notes (Signed)
Tessalon perles to [employer]; [facility] employee pharm closed

## 2018-10-17 ENCOUNTER — Encounter: Payer: Self-pay | Admitting: Family Medicine

## 2018-10-18 ENCOUNTER — Encounter: Payer: Self-pay | Admitting: Nurse Practitioner

## 2018-10-18 ENCOUNTER — Ambulatory Visit (INDEPENDENT_AMBULATORY_CARE_PROVIDER_SITE_OTHER): Payer: No Typology Code available for payment source | Admitting: Nurse Practitioner

## 2018-10-18 ENCOUNTER — Encounter (INDEPENDENT_AMBULATORY_CARE_PROVIDER_SITE_OTHER): Payer: Self-pay | Admitting: Vascular Surgery

## 2018-10-18 VITALS — BP 110/68 | HR 79 | Temp 98.5°F | Resp 16 | Ht 66.0 in | Wt 182.3 lb

## 2018-10-18 DIAGNOSIS — F419 Anxiety disorder, unspecified: Secondary | ICD-10-CM | POA: Diagnosis not present

## 2018-10-18 DIAGNOSIS — B009 Herpesviral infection, unspecified: Secondary | ICD-10-CM

## 2018-10-18 DIAGNOSIS — R350 Frequency of micturition: Secondary | ICD-10-CM | POA: Diagnosis not present

## 2018-10-18 DIAGNOSIS — F32A Depression, unspecified: Secondary | ICD-10-CM

## 2018-10-18 DIAGNOSIS — F329 Major depressive disorder, single episode, unspecified: Secondary | ICD-10-CM

## 2018-10-18 MED ORDER — ESCITALOPRAM OXALATE 20 MG PO TABS: 20.0000 mg | ORAL_TABLET | Freq: Every day | ORAL | 2 refills | Status: DC

## 2018-10-18 MED ORDER — VALACYCLOVIR HCL 1 G PO TABS: 1000.0000 mg | ORAL_TABLET | Freq: Three times a day (TID) | ORAL | 0 refills | Status: DC

## 2018-10-18 NOTE — Patient Instructions (Signed)
- Take anti-viral every 8 hours for one week, use cool compress to area to ease pain and itching; you are infectious to those who have not had chicken pox or been vaccinated until the area crusts over. If the rash spreads any closer to your eye please see an opthalmologist right away- we can refer you if you do not currently have one.  - You should receive a phone call soon about your urine results. Please ensure you are drinking at least 64 ounces of water a day, avoid caffeine.    Shingles  Shingles, which is also known as herpes zoster, is an infection that causes a painful skin rash and fluid-filled blisters. It is caused by a virus. Shingles only develops in people who:  Have had chickenpox.  Have been given a medicine to protect against chickenpox (have been vaccinated). Shingles is rare in this group. What are the causes? Shingles is caused by varicella-zoster virus (VZV). This is the same virus that causes chickenpox. After a person is exposed to VZV, the virus stays in the body in an inactive (dormant) state. Shingles develops if the virus is reactivated. This can happen many years after the first (initial) exposure to VZV. It is not known what causes this virus to be reactivated. What increases the risk? People who have had chickenpox or received the chickenpox vaccine are at risk for shingles. Shingles infection is more common in people who:  Are older than age 38.  Have a weakened disease-fighting system (immune system), such as people with: ? HIV. ? AIDS. ? Cancer.  Are taking medicines that weaken the immune system, such as transplant medicines.  Are experiencing a lot of stress. What are the signs or symptoms? Early symptoms of this condition include itching, tingling, and pain in an area on your skin. Pain may be described as burning, stabbing, or throbbing. A few days or weeks after early symptoms start, a painful red rash appears. The rash is usually on one side of the  body and has a band-like or belt-like pattern. The rash eventually turns into fluid-filled blisters that break open, change into scabs, and dry up in about 2-3 weeks. At any time during the infection, you may also develop:  A fever.  Chills.  A headache.  An upset stomach. How is this diagnosed? This condition is diagnosed with a skin exam. Skin or fluid samples may be taken from the blisters before a diagnosis is made. These samples are examined under a microscope or sent to a lab for testing. How is this treated? The rash may last for several weeks. There is not a specific cure for this condition. Your health care provider will probably prescribe medicines to help you manage pain, recover more quickly, and avoid long-term problems. Medicines may include:  Antiviral drugs.  Anti-inflammatory drugs.  Pain medicines.  Anti-itching medicines (antihistamines). If the area involved is on your face, you may be referred to a specialist, such as an eye doctor (ophthalmologist) or an ear, nose, and throat (ENT) doctor (otolaryngologist) to help you avoid eye problems, chronic pain, or disability. Follow these instructions at home: Medicines  Take over-the-counter and prescription medicines only as told by your health care provider.  Apply an anti-itch cream or numbing cream to the affected area as told by your health care provider. Relieving itching and discomfort   Apply cold, wet cloths (cold compresses) to the area of the rash or blisters as told by your health care provider.  Cool  baths can be soothing. Try adding baking soda or dry oatmeal to the water to reduce itching. Do not bathe in hot water. Blister and rash care  Keep your rash covered with a loose bandage (dressing). Wear loose-fitting clothing to help ease the pain of material rubbing against the rash.  Keep your rash and blisters clean by washing the area with mild soap and cool water as told by your health care  provider.  Check your rash every day for signs of infection. Check for: ? More redness, swelling, or pain. ? Fluid or blood. ? Warmth. ? Pus or a bad smell.  Do not scratch your rash or pick at your blisters. To help avoid scratching: ? Keep your fingernails clean and cut short. ? Wear gloves or mittens while you sleep, if scratching is a problem. General instructions  Rest as told by your health care provider.  Keep all follow-up visits as told by your health care provider. This is important.  Wash your hands often with soap and water. If soap and water are not available, use hand sanitizer. Doing this lowers your chance of getting a bacterial skin infection.  Before your blisters change into scabs, your shingles infection can cause chickenpox in people who have never had it or have never been vaccinated against it. To prevent this from happening, avoid contact with other people, especially: ? Babies. ? Pregnant women. ? Children who have eczema. ? Elderly people who have transplants. ? People who have chronic illnesses, such as cancer or AIDS. Contact a health care provider if:  Your pain is not relieved with prescribed medicines.  Your pain does not get better after the rash heals.  You have signs of infection in the rash area, such as: ? More redness, swelling, or pain around the rash. ? Fluid or blood coming from the rash. ? The rash area feeling warm to the touch. ? Pus or a bad smell coming from the rash. Get help right away if:  The rash is on your face or nose.  You have facial pain, pain around your eye area, or loss of feeling on one side of your face.  You have difficulty seeing.  You have ear pain or have ringing in your ear.  You have a loss of taste.  Your condition gets worse. Summary  Shingles, which is also known as herpes zoster, is an infection that causes a painful skin rash and fluid-filled blisters.  This condition is diagnosed with a skin  exam. Skin or fluid samples may be taken from the blisters and examined before the diagnosis is made.  Keep your rash covered with a loose bandage (dressing). Wear loose-fitting clothing to help ease the pain of material rubbing against the rash.  Before your blisters change into scabs, your shingles infection can cause chickenpox in people who have never had it or have never been vaccinated against it. This information is not intended to replace advice given to you by your health care provider. Make sure you discuss any questions you have with your health care provider. Document Released: 09/08/2005 Document Revised: 05/13/2017 Document Reviewed: 05/13/2017 Elsevier Interactive Patient Education  2019 [employer].

## 2018-10-18 NOTE — Progress Notes (Signed)
Name: Emma Rogers   MRN: [id number]    DOB: 06/09/1966   Date:10/18/2018       Progress Note  Subjective  Chief Complaint  Chief Complaint  Patient presents with  . Herpes Zoster    patient stated that she first noticed the area of concern yesterday. she has had shingles in that exact same area.  . Nausea  . Urinary Tract Infection    patient stated that she has been having dx for a couple of days now. frequency. cranberry juice, epsom salt with lavendar baths    HPI  Patient notes rash on right side of temple that started last night. Burning and itching. States has had herpes zoster in this location   Urinary frequency and urgency going on for 3-4 days now, no dysuria. Has been drinking cranberry juice and lots of water with some relief.   Depression has worsened as she recently lost her dad and boss was diagnosed with cancer. She increased her lexapro herself from 5mg  daily to 10mg  and states it has worked really well for her and helped her a lot with coping and focusing would like to further increase dosage. States has tried to reach out to counseling and psychiatry states has not been able to get through to them.  [facility] 2[facility] 10/18/2018 08/17/2018 07/12/2018  Decreased Interest 0 2 1  Down, Depressed, Hopeless 2 2 1   PHQ - 2 Score 2 4 2   Altered sleeping 2 2 1   Tired, decreased energy 2 2 1   Change in appetite 0 2 1  Feeling bad or failure about yourself  0 1 1  Trouble concentrating 2 2 1   Moving slowly or fidgety/restless 1 1 1   Suicidal thoughts 0 0 0  PHQ-9 Score 9 14 8   Difficult doing work/chores Somewhat difficult Somewhat difficult Somewhat difficult  Some recent data might be hidden      Patient Active Problem List   Diagnosis Date Noted  . Chronic venous insufficiency 03/10/2018  . Lymphedema 03/10/2018  . Elevated blood pressure reading 10/13/2017  . Anxiety 10/13/2017  . Familial hyperlipidemia 10/10/2017  . Bilateral leg edema 09/03/2017   . Depression 09/03/2017  . Chronic kidney disease, stage III (moderate) (HCC) 09/03/2017  . Vitamin D deficiency 09/03/2017  . Vitamin B12 deficiency 09/03/2017  . Medication monitoring encounter 09/03/2017  . Insomnia 08/11/2017  . Menopausal symptoms 08/11/2017  . Shortness of breath 05/18/2017  . Chest pain 03/31/2017  . Migraine without aura and without status migrainosus, not intractable 02/21/2017  . Prediabetes 02/20/2017  . Fatigue 02/20/2017  . Obesity (BMI 35.0-39.9 without comorbidity) 02/20/2017  . 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 VASOVAGAL SYNCOPE 11/27/2009  . Allergic rhinitis 01/18/2008  . HEADACHE 01/18/2008    Past Medical History:  Diagnosis Date  . Allergic rhinitis   . Asthma   . Depression   . Headache(784.0)   . Renal insufficiency    Stage 3 kidney disease.     Past Surgical History:  Procedure Laterality Date  . ABDOMINAL HYSTERECTOMY  2009   still has 1 ovary  . BREAST BIOPSY Left 09/30/2002   Ductogram-neg  . INSERT / REPLACE / REMOVE PACEMAKER     Permanent pacemaker, Dr. Graciela HusbandsKlein  . Lobe hemithroidectomy     Right    Social History   Tobacco Use  . Smoking status: Never Smoker  . Smokeless tobacco: Never Used  Substance Use Topics  . Alcohol use: No    Alcohol/week: 0.0 standard drinks  Current Outpatient Medications:  .  Albuterol Sulfate (PROAIR RESPICLICK) 108 (90 Base) MCG/ACT AEPB, Inhale 1-2 puffs into the lungs every 4 (four) hours as needed., Disp: 1 each, Rfl: 2 .  atorvastatin (LIPITOR) 80 MG tablet, Take 1 tablet (80 mg total) by mouth daily., Disp: 30 tablet, Rfl: 1 .  BREO ELLIPTA 200-25 MCG/INH AEPB, Inhale 1 puff every morning into the lungs., Disp: , Rfl: 3 .  buPROPion (WELLBUTRIN XL) 150 MG 24 hr tablet, TAKE 2 TABLETS BY MOUTH DAILY, Disp: 60 tablet, Rfl: 2 .  escitalopram (LEXAPRO) 5 MG tablet, Take 1 tablet (5 mg total) by mouth daily., Disp: 30 tablet, Rfl: 1 .  furosemide (LASIX) 20 MG tablet, , Disp: , Rfl:  3 .  montelukast (SINGULAIR) 10 MG tablet, TAKE 1 TABLET (10 MG TOTAL) BY MOUTH AT BEDTIME., Disp: 30 tablet, Rfl: 11 .  rizatriptan (MAXALT) 10 MG tablet, Take 1 tablet (10 mg total) by mouth as needed for migraine. May repeat in 2 hours if needed, Disp: 10 tablet, Rfl: 1 .  topiramate (TOPAMAX) 100 MG tablet, Take 1 tablet (100 mg total) by mouth daily., Disp: 30 tablet, Rfl: 5 .  traZODone (DESYREL) 100 MG tablet, TAKE 1 TABLET BY MOUTH AT BEDTIME AS NEEDED FOR SLEEP., Disp: 30 tablet, Rfl: 5  Current Facility-Administered Medications:  .  cyanocobalamin ((VITAMIN B-12)) injection 1,000 mcg, 1,000 mcg, Intramuscular, Q30 days, [name], Janit BernMelinda P, MD, 1,000 mcg at 01/19/18 0840  Allergies  Allergen Reactions  . Amoxicillin Other (See Comments)  . Clavulanic Acid Other (See Comments)  . Morphine And Related     vomiting    ROS   No other specific complaints in a complete review of systems (except as listed in HPI above).  Objective  Vitals:   10/18/18 1335  BP: 110/68  Pulse: 79  Resp: 16  Temp: 98.5 F (36.9 C)  TempSrc: Oral  SpO2: 98%  Weight: 182 lb 4.8 oz (82.7 kg)  Height: 5\' 6"  (1.676 m)    Body mass index is 29.42 kg/m.  Nursing Note and Vital Signs reviewed.  Physical Exam Constitutional:      Appearance: Normal appearance.  HENT:     Right Ear: Tympanic membrane, ear canal and external ear normal.     Left Ear: Tympanic membrane, ear canal and external ear normal.     Nose: Nose normal.     Mouth/Throat:     Pharynx: No posterior oropharyngeal erythema.  Eyes:     Extraocular Movements: Extraocular movements intact.     Pupils: Pupils are equal, round, and reactive to light.  Neck:     Musculoskeletal: Normal range of motion and neck supple.  Cardiovascular:     Rate and Rhythm: Normal rate and regular rhythm.  Pulmonary:     Effort: Pulmonary effort is normal.     Breath sounds: Normal breath sounds.  Abdominal:     General: Bowel sounds are  normal.     Tenderness: There is no right CVA tenderness or left CVA tenderness.  Skin:    Findings: Rash (2cm x 1cm red raised area, no vesicles present at this time, no drainage, burning pain) present.  Neurological:     General: No focal deficit present.     Mental Status: She is alert and oriented to person, place, and time.  Psychiatric:        Mood and Affect: Mood normal.        Thought Content: Thought content normal.  No results found for this or any previous visit (from the past 48 hour(s)).  Assessment & Plan  1. Frequency of urination - Urinalysis, Routine w reflex microscopic  2. Herpes infection - valACYclovir (VALTREX) 1000 MG tablet; Take 1 tablet (1,000 mg total) by mouth 3 (three) times daily.  Dispense: 21 tablet; Refill: 0  3. Depression, unspecified depression type - escitalopram (LEXAPRO) 20 MG tablet; Take 1 tablet (20 mg total) by mouth daily.  Dispense: 30 tablet; Refill: 2  4. Anxiety - escitalopram (LEXAPRO) 20 MG tablet; Take 1 tablet (20 mg total) by mouth daily.  Dispense: 30 tablet; Refill: 2    -Red flags and when to present for emergency care or RTC including fever >101.26F, chest pain, shortness of breath, new/worsening/un-resolving symptoms,  reviewed with patient at time of visit. Follow up and care instructions discussed and provided in AVS.

## 2018-10-19 LAB — URINALYSIS, ROUTINE W REFLEX MICROSCOPIC
Bilirubin Urine: NEGATIVE
Glucose, UA: NEGATIVE
Hgb urine dipstick: NEGATIVE
Ketones, ur: NEGATIVE
Leukocytes, UA: NEGATIVE
Nitrite: NEGATIVE
Protein, ur: NEGATIVE
Specific Gravity, Urine: 1.037 — ABNORMAL HIGH (ref 1.001–1.03)
pH: <=5 (ref 5.0–8.0)

## 2018-10-20 ENCOUNTER — Encounter: Payer: Self-pay | Admitting: Family Medicine

## 2018-10-20 MED ORDER — IBUPROFEN 800 MG PO TABS: 800.0000 mg | ORAL_TABLET | Freq: Three times a day (TID) | ORAL | 0 refills | Status: DC | PRN

## 2018-10-20 MED ORDER — GABAPENTIN 100 MG PO CAPS: ORAL_CAPSULE | ORAL | 0 refills | Status: DC

## 2018-11-01 ENCOUNTER — Ambulatory Visit (INDEPENDENT_AMBULATORY_CARE_PROVIDER_SITE_OTHER): Payer: No Typology Code available for payment source | Admitting: Family Medicine

## 2018-11-01 ENCOUNTER — Encounter: Payer: Self-pay | Admitting: Family Medicine

## 2018-11-01 VITALS — BP 126/74 | HR 72 | Temp 98.5°F | Resp 12 | Ht 66.0 in | Wt 180.7 lb

## 2018-11-01 DIAGNOSIS — Z5181 Encounter for therapeutic drug level monitoring: Secondary | ICD-10-CM

## 2018-11-01 DIAGNOSIS — Z9181 History of falling: Secondary | ICD-10-CM

## 2018-11-01 DIAGNOSIS — E538 Deficiency of other specified B group vitamins: Secondary | ICD-10-CM

## 2018-11-01 DIAGNOSIS — R208 Other disturbances of skin sensation: Secondary | ICD-10-CM | POA: Diagnosis not present

## 2018-11-01 DIAGNOSIS — M79605 Pain in left leg: Secondary | ICD-10-CM

## 2018-11-01 DIAGNOSIS — H539 Unspecified visual disturbance: Secondary | ICD-10-CM | POA: Diagnosis not present

## 2018-11-01 DIAGNOSIS — R251 Tremor, unspecified: Secondary | ICD-10-CM

## 2018-11-01 DIAGNOSIS — R5383 Other fatigue: Secondary | ICD-10-CM

## 2018-11-01 DIAGNOSIS — E559 Vitamin D deficiency, unspecified: Secondary | ICD-10-CM

## 2018-11-01 DIAGNOSIS — Z1211 Encounter for screening for malignant neoplasm of colon: Secondary | ICD-10-CM

## 2018-11-01 DIAGNOSIS — R29898 Other symptoms and signs involving the musculoskeletal system: Secondary | ICD-10-CM

## 2018-11-01 DIAGNOSIS — E7849 Other hyperlipidemia: Secondary | ICD-10-CM

## 2018-11-01 NOTE — Patient Instructions (Addendum)
We'll have you see the neurologist They will order appropriate labs and imaging   Preventing Unhealthy Weight Gain, Adult Staying at a healthy weight is important to your overall health. When fat builds up in your body, you may become overweight or obese. Being overweight or obese increases your risk of developing certain health problems, such as heart disease, diabetes, sleeping problems, joint problems, and some types of cancer. Unhealthy weight gain is often the result of making unhealthy food choices or not getting enough exercise. You can make changes to your lifestyle to prevent obesity and stay as healthy as possible. What nutrition changes can be made?   Eat only as much as your body needs. To do this: ? Pay attention to signs that you are hungry or full. Stop eating as soon as you feel full. ? If you feel hungry, try drinking water first before eating. Drink enough water so your urine is clear or pale yellow. ? Eat smaller portions. Pay attention to portion sizes when eating out. ? Look at serving sizes on food labels. Most foods contain more than one serving per container. ? Eat the recommended number of calories for your gender and activity level. For most active people, a daily total of 2,000 calories is appropriate. If you are trying to lose weight or are not very active, you may need to eat fewer calories. Talk with your health care provider or a diet and nutrition specialist (dietitian) about how many calories you need each day.  Choose healthy foods, such as: ? Fruits and vegetables. At each meal, try to fill at least half of your plate with fruits and vegetables. ? Whole grains, such as whole-wheat bread, brown rice, and quinoa. ? Lean meats, such as chicken or fish. ? Other healthy proteins, such as beans, eggs, or tofu. ? Healthy fats, such as nuts, seeds, fatty fish, and olive oil. ? Low-fat or fat-free dairy products.  Check food labels, and avoid food and drinks  that: ? Are high in calories. ? Have added sugar. ? Are high in sodium. ? Have saturated fats or trans fats.  Cook foods in healthier ways, such as by baking, broiling, or grilling.  Make a meal plan for the week, and shop with a grocery list to help you stay on track with your purchases. Try to avoid going to the grocery store when you are hungry.  When grocery shopping, try to shop around the outside of the store first, where the fresh foods are. Doing this helps you to avoid prepackaged foods, which can be high in sugar, salt (sodium), and fat. What lifestyle changes can be made?   Exercise for 30 or more minutes on 5 or more days each week. Exercising may include brisk walking, yard work, biking, running, swimming, and team sports like basketball and soccer. Ask your health care provider which exercises are safe for you.  Do muscle-strengthening activities, such as lifting weights or using resistance bands, on 2 or more days a week.  Do not use any products that contain nicotine or tobacco, such as cigarettes and e-cigarettes. If you need help quitting, ask your health care provider.  Limit alcohol intake to no more than 1 drink a day for nonpregnant women and 2 drinks a day for men. One drink equals 12 oz of beer, 5 oz of wine, or 1 oz of hard liquor.  Try to get 7-9 hours of sleep each night. What other changes can be made?  Keep a food  and activity journal to keep track of: ? What you ate and how many calories you had. Remember to count the calories in sauces, dressings, and side dishes. ? Whether you were active, and what exercises you did. ? Your calorie, weight, and activity goals.  Check your weight regularly. Track any changes. If you notice you have gained weight, make changes to your diet or activity routine.  Avoid taking weight-loss medicines or supplements. Talk to your health care provider before starting any new medicine or supplement.  Talk to your health care  provider before trying any new diet or exercise plan. Why are these changes important? Eating healthy, staying active, and having healthy habits can help you to prevent obesity. Those changes also:  Help you manage stress and emotions.  Help you connect with friends and family.  Improve your self-esteem.  Improve your sleep.  Prevent long-term health problems. What can happen if changes are not made? Being obese or overweight can cause you to develop joint or bone problems, which can make it hard for you to stay active or do activities you enjoy. Being obese or overweight also puts stress on your heart and lungs and can lead to health problems like diabetes, heart disease, and some cancers. Where to find more information Talk with your health care provider or a dietitian about healthy eating and healthy lifestyle choices. You may also find information from:  U.[place]. Department of Agriculture, MyPlate: https://ball-collins.biz/  American Heart Association: www.heart.org  Centers for Disease Control and Prevention: FootballExhibition.com.br Summary  Staying at a healthy weight is important to your overall health. It helps you to prevent certain diseases and health problems, such as heart disease, diabetes, joint problems, sleep disorders, and some types of cancer.  Being obese or overweight can cause you to develop joint or bone problems, which can make it hard for you to stay active or do activities you enjoy.  You can prevent unhealthy weight gain by eating a healthy diet, exercising regularly, not smoking, limiting alcohol, and getting enough sleep.  Talk with your health care provider or a dietitian for guidance about healthy eating and healthy lifestyle choices. This information is not intended to replace advice given to you by your health care provider. Make sure you discuss any questions you have with your health care provider. Document Released: 09/09/2016 Document Revised: 06/19/2017 Document  Reviewed: 10/15/2016 Elsevier Interactive Patient Education  2019 [employer].

## 2018-11-01 NOTE — Progress Notes (Signed)
BP 126/74   Pulse 72   Temp 98.5 F (36.9 C) (Oral)   Resp 12   Ht 5\' 6"  (1.676 m)   Wt 180 lb 11.2 oz (82 kg)   SpO2 96%   BMI 29.17 kg/m    Subjective:    Patient ID: Emma Rogers, female    DOB: 1966/04/22, 53 y.o.   MRN: [id number]  HPI: Emma Rogers is a 53 y.o. female  Chief Complaint  Patient presents with  . Knee Pain    left  . Tremors    in hands comes and goes  . Neck Pain    left     HPI She is here for three different things A lot of this has been going on for several years She has just not worried about it She says after Saturday, it's time to go to the doctor  She got out of the car on Saturday; stepped out of the car like she normal does, could not bear any weight on the left leg at all for five minutes; then 3-4 hours later, same day, neck started to hurt; felt like nerve pain like shingles on the left side of the neck; if she stood up, felt nauseated; Sunday morning, she got up, felt a little better but still felt it was there, had to be very careful; this seemed weird  The trembling has been going on for two years; the lady who shares a 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 notices it; the book is shaking when he has her hand extended holding the choir book  Stomach has been more upset, more than usual; having diarrhea on Saturday  She has a had a few falls; not recently; cookout at daughter's house last summer, fell into the door and didn't get hurt; just fell; she will fall but won't get hurt; I asked how many falls, "I don't even know"; I just feel like I'm crazy; felt like she was having trouble expressing herself and finding her words, but not in a year or so; all these things were happening but she chalked it up to the topirimate; feels clumsy at times  Saturday bothered her because everything was on the same side; I know I'm not crazy  I asked about vision changes; she has readers; she tries readers at the store, they do great sometimes, but then  she cannot see, prescription is not good at that time; just bought readers two weeks ago and now having trouble; this comes and goes  Not taking ibuprofen because of kidneys; did not take it  Did stop the neurontin, shingles pain over  Tremor was going on before the lexapro, going on for a while; rare glass of alcohol, has not paid attention to know if it makes tremor get better; tremor does affect her handwriting; tremor is sometimes overwhelming once in a while, whole arm can do it; right-hand dominant  Depression screen [facility] 2/9 11/01/2018 10/18/2018 08/17/2018 07/12/2018 06/08/2018  Decreased Interest 0 0 2 1 1   Down, Depressed, Hopeless 2 2 2 1 1   PHQ - 2 Score 2 2 4 2 2   Altered sleeping 3 2 2 1 2   Tired, decreased energy 3 2 2 1 3   Change in appetite 0 0 2 1 2   Feeling bad or failure about yourself  1 0 1 1 3   Trouble concentrating 3 2 2 1 2   Moving slowly or fidgety/restless 0 1 1 1 1   Suicidal thoughts 0  0 0 0 0  PHQ-9 Score 12 9 14 8 15   Difficult doing work/chores Somewhat difficult Somewhat difficult Somewhat difficult Somewhat difficult Somewhat difficult  Some recent data might be hidden   Fall Risk  11/01/2018 10/18/2018 08/17/2018 07/12/2018 06/08/2018  Falls in the past year? 1 0 0 No No  Number falls in past yr: 1 0 - - -  Injury with Fall? 0 0 - - -    Relevant past medical, surgical, family and social history reviewed Past Medical History:  Diagnosis Date  . Allergic rhinitis   . Asthma   . Depression   . Headache(784.0)   . Renal insufficiency    Stage 3 kidney disease.   . Shingles    Past Surgical History:  Procedure Laterality Date  . ABDOMINAL HYSTERECTOMY  2009   still has 1 ovary  . BREAST BIOPSY Left 09/30/2002   Ductogram-neg  . INSERT / REPLACE / REMOVE PACEMAKER     Permanent pacemaker, Dr. [name]  . Lobe hemithroidectomy     Right   Family History  Problem Relation Age of Onset  . Heart disease Father        pacemaker,arrythmias  . Lung  disease Father   . Asthma Other   . Coronary artery disease Other        Female 1st degree relative <50  . Lung cancer Other   . Prostate cancer Other        1st degree relative <50  . COPD Maternal Grandmother   . Breast cancer Maternal Grandmother   . Heart disease Maternal Grandfather   . Liver disease Paternal Grandmother   . Heart disease Paternal Grandfather    Social History   Tobacco Use  . Smoking status: Never Smoker  . Smokeless tobacco: Never Used  Substance Use Topics  . Alcohol use: No    Alcohol/week: 0.0 standard drinks  . Drug use: No     [facility] Visit from 11/01/2018 in [facility]  AUDIT-C Score  0      Interim medical history since last visit reviewed. Allergies and medications reviewed  Review of Systems Per HPI unless specifically indicated above     Objective:    BP 126/74   Pulse 72   Temp 98.5 F (36.9 C) (Oral)   Resp 12   Ht 5\' 6"  (1.676 m)   Wt 180 lb 11.2 oz (82 kg)   SpO2 96%   BMI 29.17 kg/m   Wt Readings from Last 3 Encounters:  11/01/18 180 lb 11.2 oz (82 kg)  10/18/18 182 lb 4.8 oz (82.7 kg)  08/17/18 181 lb 9.6 oz (82.4 kg)    Physical Exam Constitutional:      General: She is not in acute distress.    Appearance: She is well-developed. She is not diaphoretic.  HENT:     Head: Normocephalic and atraumatic.  Eyes:     General: No scleral icterus. Neck:     Thyroid: No thyromegaly.  Cardiovascular:     Rate and Rhythm: Normal rate and regular rhythm.     Heart sounds: Normal heart sounds. No murmur.  Pulmonary:     Effort: Pulmonary effort is normal. No respiratory distress.     Breath sounds: Normal breath sounds. No wheezing.  Abdominal:     General: Bowel sounds are normal. There is no distension.     Palpations: Abdomen is soft.  Musculoskeletal:     Comments: Mild crepitus in both kenes with  extension and flexion; no ACL or PCL instability; no laxity with forced varus or valgus  Skin:     General: Skin is warm and dry.     Coloration: Skin is not pale.  Neurological:     Mental Status: She is alert and oriented to person, place, and time.     Motor: Tremor present. No weakness or atrophy.     Deep Tendon Reflexes:     Reflex Scores:      Brachioradialis reflexes are 2+ on the right side and 2+ on the left side.      Patellar reflexes are 2+ on the right side and 2+ on the left side.    Comments: No cogwheeling; resting tremor right > left; strength 5/5 UE and LE  Psychiatric:        Behavior: Behavior normal.        Thought Content: Thought content normal.        Judgment: Judgment normal.    Results for orders placed or performed in visit on 10/18/18  Urinalysis, Routine w reflex microscopic  Result Value Ref Range   Color, Urine DARK YELLOW YELLOW   APPearance CLEAR CLEAR   Specific Gravity, Urine 1.037 (H) 1.001 - 1.03   pH < OR = 5.0 5.0 - 8.0   Glucose, UA NEGATIVE NEGATIVE   Bilirubin Urine NEGATIVE NEGATIVE   Ketones, ur NEGATIVE NEGATIVE   Hgb urine dipstick NEGATIVE NEGATIVE   Protein, ur NEGATIVE NEGATIVE   Nitrite NEGATIVE NEGATIVE   Leukocytes, UA NEGATIVE NEGATIVE      Assessment & Plan:   Problem List Items Addressed This Visit      Other   Medication monitoring encounter   Relevant Orders   COMPLETE METABOLIC PANEL WITH GFR   Familial hyperlipidemia   Relevant Orders   Lipid panel   Vitamin D deficiency   Relevant Orders   VITAMIN D 25 Hydroxy (Vit-D Deficiency, Fractures)   Vitamin B12 deficiency   Relevant Orders   Vitamin B12   Fatigue   Relevant Orders   CBC    Other Visit Diagnoses    Dysesthesia    -  Primary   Relevant Orders   Ambulatory referral to Neurology   Leg pain, left       Relevant Orders   Ambulatory referral to Neurology   Left leg weakness       Relevant Orders   Ambulatory referral to Neurology   Changes in vision       Relevant Orders   Ambulatory referral to Neurology   Screen for colon cancer         Relevant Orders   Cologuard   Falls infrequently       going along with neuro symptoms; remitting and relapsing; offered PT which patient politely declined and she is welcome to call for referral   Tremor       Relevant Orders   T4, free   TSH       Follow up plan: Return in about 3 weeks (around 11/22/2018) for follow-up visit with Dr. Sherie Don.  An after-visit summary was printed and given to the patient at check-out.  Please see the patient instructions which may contain other information and recommendations beyond what is mentioned above in the assessment and plan.  No orders of the defined types were placed in this encounter.   Orders Placed This Encounter  Procedures  . Cologuard  . T4, free  . TSH  . Vitamin B12  .  VITAMIN D 25 Hydroxy (Vit-D Deficiency, Fractures)  . CBC  . COMPLETE METABOLIC PANEL WITH GFR  . Lipid panel  . Ambulatory referral to Neurology

## 2018-11-02 LAB — VITAMIN D 25 HYDROXY (VIT D DEFICIENCY, FRACTURES): Vit D, 25-Hydroxy: 26 ng/mL — ABNORMAL LOW (ref 30–100)

## 2018-11-02 LAB — LIPID PANEL
CHOLESTEROL: 239 mg/dL — AB (ref <200)
HDL: 55 mg/dL (ref >=50)
LDL Cholesterol (Calc): 162 mg/dL (calc) — ABNORMAL HIGH
Non-HDL Cholesterol (Calc): 184 mg/dL (calc) — ABNORMAL HIGH (ref <130)
TRIGLYCERIDES: 107 mg/dL (ref <150)
Total CHOL/HDL Ratio: 4.3 (calc) (ref <5.0)

## 2018-11-02 LAB — COMPLETE METABOLIC PANEL WITH GFR
AG Ratio: 1.5 (calc) (ref 1.0–2.5)
ALT: 9 U/L (ref 6–29)
AST: 13 U/L (ref 10–35)
Albumin: 4.1 g/dL (ref 3.6–5.1)
Alkaline phosphatase (APISO): 45 U/L (ref 37–153)
BUN/Creatinine Ratio: 17 (calc) (ref 6–22)
BUN: 21 mg/dL (ref 7–25)
CO2: 28 mmol/L (ref 20–32)
Calcium: 9.4 mg/dL (ref 8.6–10.4)
Chloride: 106 mmol/L (ref 98–110)
Creat: 1.26 mg/dL — ABNORMAL HIGH (ref 0.50–1.05)
GFR, Est African American: 57 mL/min/{1.73_m2} — ABNORMAL LOW (ref >=60)
GFR, Est Non African American: 49 mL/min/{1.73_m2} — ABNORMAL LOW (ref >=60)
Globulin: 2.7 g/dL (calc) (ref 1.9–3.7)
Glucose, Bld: 93 mg/dL (ref 65–99)
Potassium: 4.3 mmol/L (ref 3.5–5.3)
Sodium: 140 mmol/L (ref 135–146)
Total Bilirubin: 0.6 mg/dL (ref 0.2–1.2)
Total Protein: 6.8 g/dL (ref 6.1–8.1)

## 2018-11-02 LAB — T4, FREE: Free T4: 1.1 ng/dL (ref 0.8–1.8)

## 2018-11-02 LAB — CBC
HCT: 40.3 % (ref 35.0–45.0)
Hemoglobin: 13.7 g/dL (ref 11.7–15.5)
MCH: 31.6 pg (ref 27.0–33.0)
MCHC: 34 g/dL (ref 32.0–36.0)
MCV: 92.9 fL (ref 80.0–100.0)
MPV: 10.6 fL (ref 7.5–12.5)
Platelets: 259 10*3/uL (ref 140–400)
RBC: 4.34 10*6/uL (ref 3.80–5.10)
RDW: 12.7 % (ref 11.0–15.0)
WBC: 5.2 10*3/uL (ref 3.8–10.8)

## 2018-11-02 LAB — VITAMIN B12: Vitamin B-12: 268 pg/mL (ref 200–1100)

## 2018-11-02 LAB — TSH: TSH: 1.01 mIU/L

## 2018-11-03 ENCOUNTER — Encounter: Payer: Self-pay | Admitting: Family Medicine

## 2018-11-03 ENCOUNTER — Other Ambulatory Visit: Payer: Self-pay | Admitting: Family Medicine

## 2018-11-03 DIAGNOSIS — Z5181 Encounter for therapeutic drug level monitoring: Secondary | ICD-10-CM

## 2018-11-03 DIAGNOSIS — E7849 Other hyperlipidemia: Secondary | ICD-10-CM

## 2018-11-03 MED ORDER — CYANOCOBALAMIN 1000 MCG/ML IJ SOLN: 1000.0000 ug | INTRAMUSCULAR | Status: DC

## 2018-11-03 MED ORDER — VITAMIN D (ERGOCALCIFEROL) 1.25 MG (50000 UNIT) PO CAPS: 50000.0000 [IU] | ORAL_CAPSULE | ORAL | 0 refills | Status: DC

## 2018-11-03 NOTE — Progress Notes (Signed)
Start vitamin B12 injections monthly

## 2018-11-04 MED ORDER — ATORVASTATIN CALCIUM 40 MG PO TABS: 40.0000 mg | ORAL_TABLET | Freq: Every day | ORAL | 1 refills | Status: DC

## 2018-11-04 NOTE — Telephone Encounter (Signed)
Lab results responded to on 11/03/2018

## 2018-11-08 ENCOUNTER — Ambulatory Visit (INDEPENDENT_AMBULATORY_CARE_PROVIDER_SITE_OTHER): Payer: No Typology Code available for payment source

## 2018-11-08 DIAGNOSIS — E538 Deficiency of other specified B group vitamins: Secondary | ICD-10-CM

## 2018-11-08 DIAGNOSIS — R29898 Other symptoms and signs involving the musculoskeletal system: Secondary | ICD-10-CM

## 2018-11-08 DIAGNOSIS — M79605 Pain in left leg: Secondary | ICD-10-CM

## 2018-11-08 DIAGNOSIS — R208 Other disturbances of skin sensation: Secondary | ICD-10-CM

## 2018-11-08 DIAGNOSIS — H539 Unspecified visual disturbance: Secondary | ICD-10-CM

## 2018-11-08 MED ORDER — CYANOCOBALAMIN 1000 MCG/ML IJ SOLN
1000.0000 ug | Freq: Once | INTRAMUSCULAR | Status: AC
  Administered 2018-11-08: 1000 ug via INTRAMUSCULAR

## 2018-11-12 LAB — COLOGUARD: Cologuard: NEGATIVE

## 2018-11-18 ENCOUNTER — Emergency Department
Admission: EM | Admit: 2018-11-18 | Discharge: 2018-11-18 | Disposition: A | Discharge status: Home / Self Care | Payer: No Typology Code available for payment source | Attending: Emergency Medicine | Admitting: Emergency Medicine

## 2018-11-18 ENCOUNTER — Other Ambulatory Visit: Payer: Self-pay

## 2018-11-18 ENCOUNTER — Ambulatory Visit: Payer: Self-pay | Admitting: *Deleted

## 2018-11-18 ENCOUNTER — Ambulatory Visit (INDEPENDENT_AMBULATORY_CARE_PROVIDER_SITE_OTHER): Payer: No Typology Code available for payment source | Admitting: Family Medicine

## 2018-11-18 ENCOUNTER — Encounter: Payer: Self-pay | Admitting: Family Medicine

## 2018-11-18 ENCOUNTER — Encounter: Payer: Self-pay | Admitting: Emergency Medicine

## 2018-11-18 VITALS — BP 124/86 | HR 63 | Temp 98.0°F | Resp 16 | Ht 66.0 in | Wt 182.7 lb

## 2018-11-18 DIAGNOSIS — Z79899 Other long term (current) drug therapy: Secondary | ICD-10-CM | POA: Insufficient documentation

## 2018-11-18 DIAGNOSIS — R001 Bradycardia, unspecified: Secondary | ICD-10-CM

## 2018-11-18 DIAGNOSIS — Z45018 Encounter for adjustment and management of other part of cardiac pacemaker: Secondary | ICD-10-CM | POA: Insufficient documentation

## 2018-11-18 DIAGNOSIS — R5383 Other fatigue: Secondary | ICD-10-CM

## 2018-11-18 DIAGNOSIS — R002 Palpitations: Secondary | ICD-10-CM | POA: Insufficient documentation

## 2018-11-18 DIAGNOSIS — N183 Chronic kidney disease, stage 3 (moderate): Secondary | ICD-10-CM | POA: Insufficient documentation

## 2018-11-18 DIAGNOSIS — Z950 Presence of cardiac pacemaker: Secondary | ICD-10-CM

## 2018-11-18 LAB — BASIC METABOLIC PANEL
Anion gap: 8 (ref 5–15)
BUN: 18 mg/dL (ref 6–20)
CO2: 26 mmol/L (ref 22–32)
Calcium: 9 mg/dL (ref 8.9–10.3)
Chloride: 107 mmol/L (ref 98–111)
Creatinine, Ser: 1.12 mg/dL — ABNORMAL HIGH (ref 0.44–1.00)
GFR calc non Af Amer: 57 mL/min — ABNORMAL LOW (ref >60)
Glucose, Bld: 109 mg/dL — ABNORMAL HIGH (ref 70–99)
Potassium: 3.5 mmol/L (ref 3.5–5.1)
Sodium: 141 mmol/L (ref 135–145)

## 2018-11-18 LAB — CBC
HCT: 40.3 % (ref 36.0–46.0)
Hemoglobin: 13.6 g/dL (ref 12.0–15.0)
MCH: 31.3 pg (ref 26.0–34.0)
MCHC: 33.7 g/dL (ref 30.0–36.0)
MCV: 92.9 fL (ref 80.0–100.0)
Platelets: 259 10*3/uL (ref 150–400)
RBC: 4.34 MIL/uL (ref 3.87–5.11)
RDW: 12.7 % (ref 11.5–15.5)
WBC: 6.3 10*3/uL (ref 4.0–10.5)
nRBC: 0 % (ref 0.0–0.2)

## 2018-11-18 LAB — TROPONIN I: Troponin I: <0.03 ng/mL (ref <0.03)

## 2018-11-18 MED ORDER — SODIUM CHLORIDE 0.9% FLUSH: 3.0000 mL | Freq: Once | INTRAVENOUS | Status: DC

## 2018-11-18 NOTE — Discharge Instructions (Signed)
Please call cardiology in the morning to set up a follow-up visit for pacemaker and palpitation evaluation.  Please transmit a report from your monitor of the pacemaker this evening and call cardiology clinic in the morning to notify them of the transmission so they may review it in detail.

## 2018-11-18 NOTE — ED Notes (Signed)
[facility] patient, no signature pad available at time of discharge. Reviewed discharge instructions with patient who verbalized understanding. Paper discharge signed and filed in record.

## 2018-11-18 NOTE — ED Triage Notes (Signed)
Sent from cardiologist office for pacemaker interogation.  Woke last night with heart racing.  Not sure how long it lasted as she went back to sleep, but it is not doing it now.

## 2018-11-18 NOTE — Telephone Encounter (Signed)
Patient came for appointment and was sent to ER

## 2018-11-18 NOTE — Telephone Encounter (Signed)
Pt stated she has been feeling fatigued lately and last night her pacemaker went into pacemaker mediated tachycardia mode. She stated she does have headache this morning. She needs to know if she should come in an be seen; the pt says that her fatigue has been overwhelming for several days; the pt says that she has to go through her primary care before seeing a specialist; nurse triage initiated and recommendations made per protocol; the pt normally sees Dr Sherie Don but she has no availability; pt offered and accepted appointment with Maurice Small, [facility], 11/18/2018 at 1120; she verbalized understanding; will route to office for notification.    Reason for Disposition . [1] MODERATE weakness (i.e., interferes with work, school, normal activities) AND [2] cause unknown  (Exceptions: weakness with acute minor illness, or weakness from poor fluid intake)  Answer Assessment - Initial Assessment Questions 1. DESCRIPTION: "Describe how you are feeling."     fatigue 2. SEVERITY: "How bad is it?"  "Can you stand and walk?"   - MILD - Feels weak or tired, but does not interfere with work, school or normal activities   - MODERATE - Able to stand and walk; weakness interferes with work, school, or normal activities   - SEVERE - Unable to stand or walk     Moderated; hard to stay awake at work 3. ONSET:  "When did the weakness begin?"     Fatigue started at least a month ago 4. CAUSE: "What do you think is causing the weakness?"     Maybe related to pace maker 5. MEDICINES: "Have you recently started a new medicine or had a change in the amount of a medicine?"    Retarted atorvastatin in the past couple of weeks 6. OTHER SYMPTOMS: "Do you have any other symptoms?" (e.g., chest pain, fever, cough, SOB, vomiting, diarrhea, bleeding, other areas of pain)     Cough; pacemake went into mediated tachycardia mode on 11/17/2018; frequent bowel movements (not diarrhea) 7. PREGNANCY: "Is there any chance you  are pregnant?" "When was your last menstrual period?"     No hysterectomy  Protocols used: WEAKNESS (GENERALIZED) AND FATIGUE-A-AH

## 2018-11-18 NOTE — ED Provider Notes (Signed)
[facility] Emergency Department Provider Note   ____________________________________________   None    (approximate)  I have reviewed the triage vital signs and the nursing notes.   HISTORY  Chief Complaint Palpitations    HPI Emma Rogers is a 53 y.o. female    history of previous bradycardia, pacemaker and stage III CKD Patient presents today for evaluation to have her pacemaker interrogated.  She reports that she has been feeling fatigued for several days now, saw her primary care doctor and they did an EKG for her and they did not see pacing spikes.  They were quite certain what her rate is set at her heart rate was in the mid 50s.  She was sent to make sure her pacemaker device is working properly  She denies any fevers or chills.  No nausea vomiting.  No chest pain.  No shortness of breath.  No episodes of passing out.  She just reports feeling fatigued.  Reports her primary care would like her to have her pacemaker interrogated.  She felt like her heart was racing at one point.  She has a history of "pacemaker mediated tachycardia" in the past and wants to make sure that was not happening.  Past Medical History:  Diagnosis Date  . Allergic rhinitis   . Asthma   . Depression   . Headache(784.0)   . Renal insufficiency    Stage 3 kidney disease.   . Shingles     Patient Active Problem List   Diagnosis Date Noted  . Chronic venous insufficiency 03/10/2018  . Lymphedema 03/10/2018  . Elevated blood pressure reading 10/13/2017  . Anxiety 10/13/2017  . Familial hyperlipidemia 10/10/2017  . Bilateral leg edema 09/03/2017  . Depression 09/03/2017  . Chronic kidney disease, stage III (moderate) (HCC) 09/03/2017  . Vitamin D deficiency 09/03/2017  . Vitamin B12 deficiency 09/03/2017  . Medication monitoring encounter 09/03/2017  . Insomnia 08/11/2017  . Menopausal symptoms 08/11/2017  . Shortness of breath 05/18/2017  . Chest pain  03/31/2017  . Migraine without aura and without status migrainosus, not intractable 02/21/2017  . Prediabetes 02/20/2017  . Fatigue 02/20/2017  . Obesity (BMI 35.0-39.9 without comorbidity) 02/20/2017  . 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 VASOVAGAL SYNCOPE 11/27/2009  . Allergic rhinitis 01/18/2008  . HEADACHE 01/18/2008    Past Surgical History:  Procedure Laterality Date  . ABDOMINAL HYSTERECTOMY  2009   still has 1 ovary  . BREAST BIOPSY Left 09/30/2002   Ductogram-neg  . INSERT / REPLACE / REMOVE PACEMAKER     Permanent pacemaker, Dr. [name]  . Lobe hemithroidectomy     Right    Prior to Admission medications   Medication Sig Start Date End Date Taking? Authorizing Provider  Albuterol Sulfate (PROAIR RESPICLICK) 108 (90 Base) MCG/ACT AEPB Inhale 1-2 puffs into the lungs every 4 (four) hours as needed. 07/12/18   Doren Custard, FNP  atorvastatin (LIPITOR) 40 MG tablet Take 1 tablet (40 mg total) by mouth at bedtime. 11/04/18   [name], [name], MD  BREO ELLIPTA 200-25 MCG/INH AEPB Inhale 1 puff every morning into the lungs. 06/04/17   [provider]  buPROPion (WELLBUTRIN XL) 150 MG 24 hr tablet TAKE 2 TABLETS BY MOUTH DAILY 08/21/18   [name], [name], MD  cyanocobalamin (,VITAMIN B-12,) 1000 MCG/ML injection Inject 1 mL (1,000 mcg total) into the muscle every 30 (thirty) days. 11/03/18   Kerman Passey, MD  escitalopram (LEXAPRO) 20 MG tablet Take 1 tablet (  20 mg total) by mouth daily. 10/18/18   [name], Percell BeltElizabeth E, NP  furosemide (LASIX) 20 MG tablet  02/11/18   [provider]  montelukast (SINGULAIR) 10 MG tablet TAKE 1 TABLET (10 MG TOTAL) BY MOUTH AT BEDTIME. 10/06/17   [name], Janit BernMelinda P, MD  rizatriptan (MAXALT) 10 MG tablet Take 1 tablet (10 mg total) by mouth as needed for migraine. May repeat in 2 hours if needed 08/17/18   Cheryle HorsfallPoulose, Elizabeth E, NP  topiramate (TOPAMAX) 100 MG tablet Take 1 tablet (100 mg total) by mouth daily. 08/17/18   [name], Percell BeltElizabeth E, NP    traZODone (DESYREL) 100 MG tablet TAKE 1 TABLET BY MOUTH AT BEDTIME AS NEEDED FOR SLEEP. Patient taking differently: Take 100 mg by mouth at bedtime.  09/17/18   Kerman PasseyLada, Melinda P, MD  valACYclovir (VALTREX) 1000 MG tablet Take 1 tablet (1,000 mg total) by mouth 3 (three) times daily. Patient not taking: Reported on 11/18/2018 10/18/18   Cheryle HorsfallPoulose, Elizabeth E, NP  Vitamin D, Ergocalciferol, (DRISDOL) 1.25 MG (50000 UT) CAPS capsule Take 1 capsule (50,000 Units total) by mouth every 7 (seven) days. 11/03/18   Kerman PasseyLada, Melinda P, MD    Allergies Amoxicillin; Clavulanic acid; and Morphine and related  Family History  Problem Relation Age of Onset  . Heart disease Father        pacemaker,arrythmias  . Lung disease Father   . Asthma Other   . Coronary artery disease Other        Female 1st degree relative <50  . Lung cancer Other   . Prostate cancer Other        1st degree relative <50  . COPD Maternal Grandmother   . Breast cancer Maternal Grandmother   . Heart disease Maternal Grandfather   . Liver disease Paternal Grandmother   . Heart disease Paternal Grandfather     Social History Social History   Tobacco Use  . Smoking status: Never Smoker  . Smokeless tobacco: Never Used  Substance Use Topics  . Alcohol use: No    Alcohol/week: 0.0 standard drinks  . Drug use: No    Review of Systems Constitutional: No fever/chills Eyes: No visual changes. ENT: No sore throat. Cardiovascular: Denies chest pain. Respiratory: Denies shortness of breath. Gastrointestinal: No abdominal pain.   Genitourinary: Negative for dysuria. Musculoskeletal: Negative for back pain. Skin: Negative for rash. Neurological: Negative for headaches, areas of focal weakness or numbness.    ____________________________________________   PHYSICAL EXAM:  VITAL SIGNS: ED Triage Vitals [11/18/18 1316]  Enc Vitals Group     BP (!) 137/97     Pulse Rate 63     Resp 16     Temp 98.1 F (36.7 C)     Temp  Source Oral     SpO2 100 %     Weight 182 lb 8.7 oz (82.8 kg)     Height 5\' 6"  (1.676 m)     Head Circumference      Peak Flow      Pain Score 3     Pain Loc      Pain Edu?      Excl. in GC?     Constitutional: Alert and oriented. Well appearing and in no acute distress.  Patient and her husband are both very pleasant.  In no distress. Eyes: Conjunctivae are normal. Head: Atraumatic. Neck: No stridor.  Cardiovascular: Normal rate, regular rhythm. Grossly normal heart sounds.  Good peripheral circulation. Respiratory: Normal respiratory effort.  No retractions. Lungs CTAB. Musculoskeletal: Moves all extremities well.  Seated upright in the bed in the hallway. Neurologic:  Normal speech and language. No gross focal neurologic deficits are appreciated.  Skin:  Skin is warm, dry and intact. No rash noted. Psychiatric: Mood and affect are normal. Speech and behavior are normal.  ____________________________________________   LABS (all labs ordered are listed, but only abnormal results are displayed)  Labs Reviewed  BASIC METABOLIC PANEL - Abnormal; Notable for the following components:      Result Value   Glucose, Bld 109 (*)    Creatinine, Ser 1.12 (*)    GFR calc non Af Amer 57 (*)    All other components within normal limits  CBC  TROPONIN I   ____________________________________________  EKG  However reviewed by me at 1330 Heart rate 65 QRS 80 QTc 430 Normal sinus rhythm, no evidence of ischemia. ____________________________________________  RADIOLOGY   ____________________________________________   PROCEDURES  Procedure(s) performed: None  Procedures  Critical Care performed: No  ____________________________________________   INITIAL IMPRESSION / ASSESSMENT AND PLAN / ED COURSE  Pertinent labs & imaging results that were available during my care of the patient were reviewed by me and considered in my medical decision making (see chart for  details).   Patient is sent for evaluation of her pacemaker.  She is presently in no distress and asymptomatic.  Had an episode recently that she thought could be tachycardia related to her pacemaker.  We interrogated her device and did not find any evidence of significant arrhythmia, though an episode of elevated heart rate was noted several days ago.  I sat down and reviewed her pacemaker report, I called and detailed this with Dr. Elberta Fortis of cardiology and in review he recommends that the patient should follow-up in his clinic.  Call for an appointment tomorrow and also transmit a report from her home monitor as well for his review.  I discussed with the patient and her husband, she is agreeable with this plan.  Currently asymptomatic.  I do not see evidence on her report of battery failure, ventricular arrhythmia, or evidence of a life concerning rhythm.  Return precautions and treatment recommendations and follow-up discussed with the patient who is agreeable with the plan.       ____________________________________________   FINAL CLINICAL IMPRESSION(S) / ED DIAGNOSES  Final diagnoses:  Encounter for interrogation of cardiac pacemaker        Note:  This document was prepared using Dragon voice recognition software and may include unintentional dictation errors       Sharyn Creamer, MD 11/20/18 2119

## 2018-11-18 NOTE — ED Triage Notes (Signed)
FIRST NURSE NOTE-pt sent to have pacemaker interrogated.  Pt thought it fired last night. Has not fired today.  Seen and HR was normal at cardiology office.  Pt only has pacer, not pacer/defib. Ambulatory. NAD.

## 2018-11-18 NOTE — ED Notes (Signed)
[employer]tr[employer] technician called this RN to give interrogation report.  reprot faxed and placed with patient chart.  Patient's lead 1 has had a warning since January and her last episode of tachycardia noted occurred feb. 23rd.  Low rate is set at 50bpm.

## 2018-11-18 NOTE — Progress Notes (Signed)
Name: Emma Rogers   MRN: [id number]    DOB: 02/16/1966   Date:11/18/2018       Progress Note  Subjective  Chief Complaint  Chief Complaint  Patient presents with  . Fatigue    pacemaker was firing  . Referral    Cardiology Dr.Camintz at [facility]    HPI  Pt presents with concern for pacer firing - has history of pacemaker-mediated tachycardia that required admission in the past.  She has been dealing with significant fatigue over the last few months - worsening over the last week.  Last night while she was asleep, she was awakened by the tachycardia that felt the same as her previous episode.  She is unsure if this has been happening in the middle of the night for several days or not.  Endorses some chest pressure and soreness around the pacer, but not pain.  Pacer is demand, dual chamber (not defibrillator), leads have been in since she was 53 years old, according to her card, last replacement was 2011. No shortness of breath except with exertion, has ongoing baseline BLE edema.   Patient Active Problem List   Diagnosis Date Noted  . Chronic venous insufficiency 03/10/2018  . Lymphedema 03/10/2018  . Elevated blood pressure reading 10/13/2017  . Anxiety 10/13/2017  . Familial hyperlipidemia 10/10/2017  . Bilateral leg edema 09/03/2017  . Depression 09/03/2017  . Chronic kidney disease, stage III (moderate) (HCC) 09/03/2017  . Vitamin D deficiency 09/03/2017  . Vitamin B12 deficiency 09/03/2017  . Medication monitoring encounter 09/03/2017  . Insomnia 08/11/2017  . Menopausal symptoms 08/11/2017  . Shortness of breath 05/18/2017  . Chest pain 03/31/2017  . Migraine without aura and without status migrainosus, not intractable 02/21/2017  . Prediabetes 02/20/2017  . Fatigue 02/20/2017  . Obesity (BMI 35.0-39.9 without comorbidity) 02/20/2017  . 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 VASOVAGAL SYNCOPE 11/27/2009  . Allergic rhinitis 01/18/2008  . HEADACHE 01/18/2008    Social  History   Tobacco Use  . Smoking status: Never Smoker  . Smokeless tobacco: Never Used  Substance Use Topics  . Alcohol use: No    Alcohol/week: 0.0 standard drinks     Current Outpatient Medications:  .  Albuterol Sulfate (PROAIR RESPICLICK) 108 (90 Base) MCG/ACT AEPB, Inhale 1-2 puffs into the lungs every 4 (four) hours as needed., Disp: 1 each, Rfl: 2 .  atorvastatin (LIPITOR) 40 MG tablet, Take 1 tablet (40 mg total) by mouth at bedtime., Disp: 30 tablet, Rfl: 1 .  BREO ELLIPTA 200-25 MCG/INH AEPB, Inhale 1 puff every morning into the lungs., Disp: , Rfl: 3 .  buPROPion (WELLBUTRIN XL) 150 MG 24 hr tablet, TAKE 2 TABLETS BY MOUTH DAILY, Disp: 60 tablet, Rfl: 2 .  cyanocobalamin (,VITAMIN B-12,) 1000 MCG/ML injection, Inject 1 mL (1,000 mcg total) into the muscle every 30 (thirty) days., Disp: , Rfl:  .  escitalopram (LEXAPRO) 20 MG tablet, Take 1 tablet (20 mg total) by mouth daily., Disp: 30 tablet, Rfl: 2 .  furosemide (LASIX) 20 MG tablet, , Disp: , Rfl: 3 .  montelukast (SINGULAIR) 10 MG tablet, TAKE 1 TABLET (10 MG TOTAL) BY MOUTH AT BEDTIME., Disp: 30 tablet, Rfl: 11 .  rizatriptan (MAXALT) 10 MG tablet, Take 1 tablet (10 mg total) by mouth as needed for migraine. May repeat in 2 hours if needed, Disp: 10 tablet, Rfl: 1 .  topiramate (TOPAMAX) 100 MG tablet, Take 1 tablet (100 mg total) by mouth daily., Disp: 30 tablet, Rfl: 5 .  traZODone (DESYREL) 100 MG tablet, TAKE 1 TABLET BY MOUTH AT BEDTIME AS NEEDED FOR SLEEP. (Patient taking differently: Take 100 mg by mouth at bedtime. ), Disp: 30 tablet, Rfl: 5 .  Vitamin D, Ergocalciferol, (DRISDOL) 1.25 MG (50000 UT) CAPS capsule, Take 1 capsule (50,000 Units total) by mouth every 7 (seven) days., Disp: 4 capsule, Rfl: 0 .  valACYclovir (VALTREX) 1000 MG tablet, Take 1 tablet (1,000 mg total) by mouth 3 (three) times daily. (Patient not taking: Reported on 11/18/2018), Disp: 21 tablet, Rfl: 0  Allergies  Allergen Reactions  .  Amoxicillin Other (See Comments)  . Clavulanic Acid Other (See Comments)  . Morphine And Related     vomiting    I personally reviewed active problem list, medication list, allergies, notes from last encounter with the patient/caregiver today.  ROS  Ten systems reviewed and is negative except as mentioned in HPI  Objective  Vitals:   11/18/18 1141  BP: 124/86  Pulse: 63  Resp: 16  Temp: 98 F (36.7 C)  TempSrc: Oral  SpO2: 98%  Weight: 182 lb 11.2 oz (82.9 kg)  Height: 5\' 6"  (1.676 m)   Body mass index is 29.49 kg/m.  Nursing Note and Vital Signs reviewed.  Physical Exam  Constitutional: Patient appears well-developed and well-nourished. No distress.  HENT: Head: Normocephalic and atraumatic. Ears: bilateral TMs with no erythema or effusion; Nose: Nose normal. Mouth/Throat: Oropharynx is clear and moist. No oropharyngeal exudate or tonsillar swelling.  Eyes: Conjunctivae and EOM are normal. No scleral icterus.  Pupils are equal, round, and reactive to light.  Neck: Normal range of motion. Neck supple. No JVD present. No thyromegaly present.  Cardiovascular: Bradycardic rate, regular rhythm and normal heart sounds.  No murmur heard. No BLE edema. Pacer is palpated in LEFT chest. Pulmonary/Chest: Effort normal and breath sounds normal. No respiratory distress. Musculoskeletal: Normal range of motion, no joint effusions. No gross deformities Neurological: Pt is alert and oriented to person, place, and time. No cranial nerve deficit. Coordination, balance, strength, speech and gait are normal.  Skin: Skin is warm and dry. No rash noted. No erythema.  Psychiatric: Patient has a normal mood and affect. behavior is normal. Judgment and thought content normal.   No results found for this or any previous visit (from the past 72 hour(s)).  Assessment & Plan  1. Bradycardia by electrocardiogram HR is 57bpm on EKG, auscultation finds mild bradycardia around 55bpm.  She is unsure  of her pacer settings, but does state that it is a demand pacer.  I am concerned that her pacemaker is not reacting appropriately to her heart rate/rhythm as she is having significant worsening of fatigue, experienced pacemaker activity last night with feeling of tachycardia, and in office today she is bradycardic with no sign of pacer spikes on ECG.  Unfortunately we are not able to interrogate her pacemaker in the office, and she has not been in contact with her cardiologist in over a year.  We did discuss that her safest transportation method would be via EMS, but she refuses and requests to go by POV.  Her husband will transport her to [facility] ER.  ER nurse first is given report.  2. PPM-[employer]tronic - EKG 12-Lead  3. Other fatigue - EKG 12-Lead

## 2018-11-18 NOTE — ED Notes (Signed)
Pacer interrogation done

## 2018-11-19 ENCOUNTER — Ambulatory Visit: Payer: No Typology Code available for payment source | Admitting: *Deleted

## 2018-11-22 ENCOUNTER — Ambulatory Visit (INDEPENDENT_AMBULATORY_CARE_PROVIDER_SITE_OTHER): Payer: No Typology Code available for payment source | Admitting: Family Medicine

## 2018-11-22 ENCOUNTER — Encounter: Payer: Self-pay | Admitting: Family Medicine

## 2018-11-22 VITALS — BP 122/76 | HR 70 | Temp 98.8°F | Resp 15 | Ht 66.0 in | Wt 179.3 lb

## 2018-11-22 DIAGNOSIS — R413 Other amnesia: Secondary | ICD-10-CM | POA: Diagnosis not present

## 2018-11-22 DIAGNOSIS — E538 Deficiency of other specified B group vitamins: Secondary | ICD-10-CM

## 2018-11-22 DIAGNOSIS — F419 Anxiety disorder, unspecified: Secondary | ICD-10-CM | POA: Diagnosis not present

## 2018-11-22 DIAGNOSIS — R0789 Other chest pain: Secondary | ICD-10-CM

## 2018-11-22 DIAGNOSIS — N183 Chronic kidney disease, stage 3 unspecified: Secondary | ICD-10-CM

## 2018-11-22 DIAGNOSIS — E7849 Other hyperlipidemia: Secondary | ICD-10-CM

## 2018-11-22 NOTE — Patient Instructions (Addendum)
Please return for fasting labs on or just after March 26th (cholesterol) Continue the monthly vitamin B12 injections

## 2018-11-22 NOTE — Progress Notes (Signed)
BP 122/76   Pulse 70   Temp 98.8 F (37.1 C)   Resp 15   Ht  (1.676 m)   Wt 179 lb 4.8 oz (81.3 kg)   SpO2 98%   BMI 28.94 kg/m    Subjective:    Patient ID: Emma Rogers, female    DOB: [birth year], 53 y.o.   MRN: [id number]  HPI: Emma Rogers is a 53 y.o. female  Chief Complaint  Patient presents with  . Follow-up    HPI Patient is here for f/u  She has an appointment with her cardiologist tomorrow, Dr. Elberta Fortis, electrophysiologist May need surgery for a bad lead She is having some chest pressure Sometimes feels a little nauseated No sweating, nothing like that She says the chest pressure is worse; "I know that I haven't had a heart attack" Her troponin level was fine; "I don't fear for that" The alarm has been there since January She started to have extreme fatigue in January Did feel tachycardia a few days prior When patient was at the ER, husband was very agitated, thought she was waiting for a room (?), husband argued with her; she is going to see a counselor on Friday; she is safe; she says that what she said by the time the doctor got to her, she just wanted to go home EKG done, "NSR, no evidence of ischemia" They did a troponin and she was having chest pressure at that time she tells me She has also had a little bit of cough since the ER, little aggravating cough, not couhing up any thing; she does not think she needs to go back to the ER She knows something is there, not an elephant sitting on her chest; off and on for a month at least; uncomfortable up under the arm and mashing on the pacemaker and wondering if that is a problem; she has been pressing on it and messing with it; that is not constant; underarm is sore, since early February; all of this makes sense to her, she wonders if it is her pacemaker; if leads not firing right We talked about the troponin I on Nov 18, 2018 at 1333 and she says her symptoms present at that time; she did not tell the  ER doctor because of the issue with husband and just wanting to leave and get out of the ER  CKD stage 3; seeing Dr. Cherylann Ratel on Wednesday; avoiding NSAIDs; good water drinker; grandfather on mother's side and mother had some kidney/bladder issues; mother has interstitial cystitis  Using SABA not even weekly,just if cleaning ceiling fans and   High cholesterol; LDL 162 and she is back on medicine  Lab Results  Component Value Date   CHOL 239 (H) 11/01/2018   HDL 55 11/01/2018   LDLCALC 162 (H) 11/01/2018   LDLDIRECT 178.3 02/13/2012   TRIG 107 11/01/2018   CHOLHDL 4.3 11/01/2018   She does not know if her pacemaker issue will fix the memory issues; like today, she was on the phone with her mother and got distracted and drove past where she was supposed to go; she drove past the [employer] store (not on the phone) and did not spot; she used to remember patients by name and now struggles with that; can't remember something just walking down the hallway; struggles with word findings, pauses to try to find the word, "whatyoumacallit" or struggling to comprehend converstations; put her straw in her water, clear straw in water and then  was looking for her straw and didn't remember that she just put the straw in her water; memory issues and coming and going; good one week, bad the next; picked up medicine at pharmacy on Tuesday and then Thursday went back to pick up same medicine; she is going to to see a neurologist; voice will get raspy, ears are stopped up, Sharyon Cable, DNP looke din them, no fluid and no wax; sees psychiatrist later this week  Lab Results  Component Value Date   TSH 1.01 11/01/2018     Depression screen [facility] 2/9 11/22/2018 11/18/2018 11/01/2018 10/18/2018 08/17/2018  Decreased Interest 0 1 0 0 2  Down, Depressed, Hopeless 0 1 2 2 2   PHQ - 2 Score 0 2 2 2 4   Altered sleeping 0 - 3 2 2   Tired, decreased energy 0 2 3 2 2   Change in appetite 0 2 0 0 2  Feeling bad or failure about  yourself  0 1 1 0 1  Trouble concentrating 0 2 3 2 2   Moving slowly or fidgety/restless 0 1 0 1 1  Suicidal thoughts 0 0 0 0 0  PHQ-9 Score 0 - 12 9 14   Difficult doing work/chores Not difficult at all Somewhat difficult Somewhat difficult Somewhat difficult Somewhat difficult  Some recent data might be hidden  MD note: does not appear clinically depressed; did appear tired and stressed, but not depressed or despondent  Fall Risk  11/22/2018 11/18/2018 11/01/2018 10/18/2018 08/17/2018  Falls in the past year? 1 1 1  0 0  Number falls in past yr: 1 1 1  0 -  Injury with Fall? 0 0 0 0 -  Risk for fall due to : - History of fall(s) - - -  Follow up - Falls evaluation completed - - -    Relevant past medical, surgical, family and social history reviewed Past Medical History:  Diagnosis Date  . Allergic rhinitis   . Anxiety   . Asthma   . Depression   . Headache(784.0)   . Renal insufficiency    Stage 3 kidney disease.   . Shingles    Past Surgical History:  Procedure Laterality Date  . ABDOMINAL HYSTERECTOMY  2009   still has 1 ovary  . BREAST BIOPSY Left 09/30/2002   Ductogram-neg  . INSERT / REPLACE / REMOVE PACEMAKER     Permanent pacemaker, Dr. [name]  . Lobe hemithroidectomy     Right   Family History  Problem Relation Age of Onset  . Heart disease Father        pacemaker,arrythmias  . Lung disease Father   . Asthma Other   . Coronary artery disease Other        Female 1st degree relative <50  . Lung cancer Other   . Prostate cancer Other        1st degree relative <50  . COPD Maternal Grandmother   . Breast cancer Maternal Grandmother   . Heart disease Maternal Grandfather   . Liver disease Paternal Grandmother   . Heart disease Paternal Grandfather    Social History   Tobacco Use  . Smoking status: Never Smoker  . Smokeless tobacco: Never Used  Substance Use Topics  . Alcohol use: No    Alcohol/week: 0.0 standard drinks  . Drug use: No     Office Visit from  11/22/2018 in [facility]  AUDIT-C Score  0      Interim medical history since last visit reviewed. Allergies  and medications reviewed  Review of Systems Per HPI unless specifically indicated above     Objective:    BP 122/76   Pulse 70   Temp 98.8 F (37.1 C)   Resp 15   Ht 5\' 6"  (1.676 m)   Wt 179 lb 4.8 oz (81.3 kg)   SpO2 98%   BMI 28.94 kg/m   Wt Readings from Last 3 Encounters:  11/23/18 183 lb (83 kg)  11/22/18 179 lb 4.8 oz (81.3 kg)  11/18/18 182 lb 8.7 oz (82.8 kg)    Physical Exam Constitutional:      Appearance: She is well-developed. She is obese.  Eyes:     General: No scleral icterus. Cardiovascular:     Rate and Rhythm: Normal rate and regular rhythm.  Pulmonary:     Effort: Pulmonary effort is normal.     Breath sounds: Normal breath sounds.  Musculoskeletal:     Right lower leg: No edema.     Left lower leg: No edema.  Neurological:     Mental Status: She is alert.  Psychiatric:        Mood and Affect: Mood is not anxious. Affect is flat.        Behavior: Behavior normal.        Thought Content: Thought content does not include homicidal or suicidal ideation.     Comments: Good eye contact with examiner     Results for orders placed or performed during the hospital encounter of 11/18/18  Basic metabolic panel  Result Value Ref Range   Sodium 141 135 - 145 mmol/L   Potassium 3.5 3.5 - 5.1 mmol/L   Chloride 107 98 - 111 mmol/L   CO2 26 22 - 32 mmol/L   Glucose, Bld 109 (H) 70 - 99 mg/dL   BUN 18 6 - 20 mg/dL   Creatinine, Ser 3.15 (H) 0.44 - 1.00 mg/dL   Calcium 9.0 8.9 - 40.0 mg/dL   GFR calc non Af Amer 57 (L) >60 mL/min   GFR calc Af Amer >60 >60 mL/min   Anion gap 8 5 - 15  CBC  Result Value Ref Range   WBC 6.3 4.0 - 10.5 K/uL   RBC 4.34 3.87 - 5.11 MIL/uL   Hemoglobin 13.6 12.0 - 15.0 g/dL   HCT 86.7 61.9 - 50.9 %   MCV 92.9 80.0 - 100.0 fL   MCH 31.3 26.0 - 34.0 pg   MCHC 33.7 30.0 - 36.0 g/dL   RDW  32.6 71.2 - 45.8 %   Platelets 259 150 - 400 K/uL   nRBC 0.0 0.0 - 0.2 %  Troponin I - ONCE - STAT  Result Value Ref Range   Troponin I <0.03 <0.03 ng/mL      Assessment & Plan:   Problem List Items Addressed This Visit      Genitourinary   Chronic kidney disease, stage III (moderate) (HCC)    Seeing nephrologist; avoiding NSAIDs        Other   Vitamin B12 deficiency    Reviewed last level, well under 400; monthly B12 injections here; this may help fatigue and memory as well      Familial hyperlipidemia    Patient is back on statin; due for recheck lipids soon      Anxiety    Significant anxiety and stress over relationship with husband; he does not want to partake in counseling she says because then that would look like he's not a good husband;  she is seeing a Veterinary surgeon; she is safe; I am here for her; supportive listening provider       Other Visit Diagnoses    Chest pressure    -  Primary   discussed at length; neg trop (pt says sx prsent in ER during last eval); seeing cardiologist tomorrow; may be stress-related as well; to ER if concerning   Memory impairment       patient thinks pacemaker issues may be affecting her energy and memory and causing other sx; sees cards tomorrow; to neuro if not       Follow up plan: Return in about 1 month (around 12/23/2018) for follow-up visit with Dr. Sherie Don.  An after-visit summary was printed and given to the patient at check-out.  Please see the patient instructions which may contain other information and recommendations beyond what is mentioned above in the assessment and plan.  No orders of the defined types were placed in this encounter.   No orders of the defined types were placed in this encounter.

## 2018-11-23 ENCOUNTER — Encounter: Payer: Self-pay | Admitting: Cardiology

## 2018-11-23 ENCOUNTER — Ambulatory Visit: Payer: No Typology Code available for payment source | Admitting: Cardiology

## 2018-11-23 VITALS — BP 112/62 | HR 73 | Ht 66.0 in | Wt 183.0 lb

## 2018-11-23 DIAGNOSIS — R002 Palpitations: Secondary | ICD-10-CM

## 2018-11-23 NOTE — Patient Instructions (Signed)
Medication Instructions:  Your physician recommends that you continue on your current medications as directed. Please refer to the Current Medication list given to you today.  *If you need a refill on your cardiac medications before your next appointment, please call your pharmacy*  Labwork: None ordered  Testing/Procedures: None ordered  Follow-Up: Remote monitoring is used to monitor your Pacemaker or ICD from home. This monitoring reduces the number of office visits required to check your device to one time per year. It allows [place] to keep an eye on the functioning of your device to ensure it is working properly. You are scheduled for a device check from home on 02/22/19. You may send your transmission at any time that day. If you have a wireless device, the transmission will be sent automatically. After your physician reviews your transmission, you will receive a postcard with your next transmission date.  Your physician wants you to follow-up in: 6 months with Dr. Elberta Fortis.  You will receive a reminder letter in the mail two months in advance. If you don't receive a letter, please call our office to schedule the follow-up appointment.  Thank you for choosing [facility]!!   Dory Horn, RN [phone]

## 2018-11-23 NOTE — Progress Notes (Signed)
Electrophysiology Office Note   Date:  11/23/2018   ID:  Emma Rogers, Emma Rogers 16, 1967, MRN [id number]  PCP:  Kerman Passey, MD Primary Electrophysiologist:  Regan Lemming, MD    No chief complaint on file.    History of Present Illness: Emma Rogers is a 53 y.o. female who presents today for electrophysiology evaluation.   She is feeling well today without major complaint. That being said, she does have some chest pain. The pain occurs both at rest and with exertion. There is no exacerbating or alleviating factor. It is associated with mild shortness of breath. She feels that the chest pain is likely due to asthma. Her pain has not changed since last being seen. Her palpitations also continue. There is no exacerbating or alleviating factor.   Today, denies symptoms of palpitations, chest pain, shortness of breath, orthopnea, PND, lower extremity edema, claudication, dizziness, presyncope, syncope, bleeding, or neurologic sequela. The patient is tolerating medications without difficulties.  Her main complaints today are of weakness and fatigue.  She is also had episodes of tachycardia.  Device interrogation shows last episode of tachycardia was in December.  She is also having frequent episodes of weakness and fatigue.  Device interrogation shows stable parameters.  She is set to pace at 50 beats a minute..    Past Medical History:  Diagnosis Date  . Allergic rhinitis   . Asthma   . Depression   . Headache(784.0)   . Renal insufficiency    Stage 3 kidney disease.   . Shingles    Past Surgical History:  Procedure Laterality Date  . ABDOMINAL HYSTERECTOMY  2009   still has 1 ovary  . BREAST BIOPSY Left 09/30/2002   Ductogram-neg  . INSERT / REPLACE / REMOVE PACEMAKER     Permanent pacemaker, Dr. [name]  . Lobe hemithroidectomy     Right     Current Outpatient Medications  Medication Sig Dispense Refill  . Albuterol Sulfate (PROAIR RESPICLICK) 108 (90 Base)  MCG/ACT AEPB Inhale 1-2 puffs into the lungs every 4 (four) hours as needed. 1 each 2  . atorvastatin (LIPITOR) 40 MG tablet Take 1 tablet (40 mg total) by mouth at bedtime. 30 tablet 1  . buPROPion (WELLBUTRIN XL) 150 MG 24 hr tablet TAKE 2 TABLETS BY MOUTH DAILY 60 tablet 2  . cyanocobalamin (,VITAMIN B-12,) 1000 MCG/ML injection Inject 1 mL (1,000 mcg total) into the muscle every 30 (thirty) days.    Marland Kitchen escitalopram (LEXAPRO) 20 MG tablet Take 1 tablet (20 mg total) by mouth daily. 30 tablet 2  . furosemide (LASIX) 20 MG tablet   3  . montelukast (SINGULAIR) 10 MG tablet TAKE 1 TABLET (10 MG TOTAL) BY MOUTH AT BEDTIME. 30 tablet 11  . rizatriptan (MAXALT) 10 MG tablet Take 1 tablet (10 mg total) by mouth as needed for migraine. May repeat in 2 hours if needed 10 tablet 1  . topiramate (TOPAMAX) 100 MG tablet Take 1 tablet (100 mg total) by mouth daily. 30 tablet 5  . traZODone (DESYREL) 100 MG tablet TAKE 1 TABLET BY MOUTH AT BEDTIME AS NEEDED FOR SLEEP. (Patient taking differently: Take 100 mg by mouth at bedtime. ) 30 tablet 5  . Vitamin D, Ergocalciferol, (DRISDOL) 1.25 MG (50000 UT) CAPS capsule Take 1 capsule (50,000 Units total) by mouth every 7 (seven) days. 4 capsule 0   No current facility-administered medications for this visit.     Allergies:   Amoxicillin; Clavulanic acid; and  Morphine and related   Social History:  The patient  reports that she has never smoked. She has never used smokeless tobacco. She reports that she does not drink alcohol or use drugs.   Family History:  The patient's family history includes Asthma in an other family member; Breast cancer in her maternal grandmother; COPD in her maternal grandmother; Coronary artery disease in an other family member; Heart disease in her father, maternal grandfather, and paternal grandfather; Liver disease in her paternal grandmother; Lung cancer in an other family member; Lung disease in her father; Prostate cancer in an other  family member.    ROS:  Please see the history of present illness.   Otherwise, review of systems is positive for chest pressure, leg swelling, appetite change, shortness of breath, nausea, depression, dizziness, headaches.   All other systems are reviewed and negative.   PHYSICAL EXAM: VS:  BP 112/62   Pulse 73   Ht 5\' 6"  (1.676 m)   Wt 183 lb (83 kg)   SpO2 99%   BMI 29.54 kg/m  , BMI Body mass index is 29.54 kg/m. GEN: Well nourished, well developed, in no acute distress  HEENT: normal  Neck: no JVD, carotid bruits, or masses Cardiac: RRR; no murmurs, rubs, or gallops,no edema  Respiratory:  clear to auscultation bilaterally, normal work of breathing GI: soft, nontender, nondistended, + BS MS: no deformity or atrophy  Skin: warm and dry, device site well healed Neuro:  Strength and sensation are intact Psych: euthymic mood, full affect  EKG:  EKG is not ordered today. Personal review of the ekg ordered 11/18/18 shows sinus rhythm, rate 65  Personal review of the device interrogation today. Results in [facility]   Recent Labs: 11/01/2018: ALT 9; TSH 1.01 11/18/2018: BUN 18; Creatinine, Ser 1.12; Hemoglobin 13.6; Platelets 259; Potassium 3.5; Sodium 141    Lipid Panel     Component Value Date/Time   CHOL 239 (H) 11/01/2018 1156   TRIG 107 11/01/2018 1156   HDL 55 11/01/2018 1156   CHOLHDL 4.3 11/01/2018 1156   VLDL 12.8 02/13/2012 1039   LDLCALC 162 (H) 11/01/2018 1156   LDLDIRECT 178.3 02/13/2012 1039     Wt Readings from Last 3 Encounters:  11/23/18 183 lb (83 kg)  11/22/18 179 lb 4.8 oz (81.3 kg)  11/18/18 182 lb 8.7 oz (82.8 kg)      Other studies Reviewed: Additional studies/ records that were reviewed today include: TTE 01/11/18 - Left ventricle: The cavity size was normal. Systolic function was   normal. The estimated ejection fraction was in the range of 60%   to 65%. Wall motion was normal; there were no regional wall   motion abnormalities. Left  ventricular diastolic function   parameters were normal. - Left atrium: The atrium was normal in size. - Right ventricle: Pacer wire or catheter noted in right ventricle.   Systolic function was normal. - Pulmonary arteries: Systolic pressure was within the normal   range.   ASSESSMENT AND PLAN:  1.  Dual-chamber pacemaker: This point, it is unclear to me as to the cause of her pacemaker implantation.  Was implanted in 1996 in [place].  She does appear to have a one-to-one tachycardia last in December around the time of her father's passing that could be sinus tachycardia versus an atrial tachycardia.  Device is functioning appropriately with some noise on the atrial lead and low impedance.  Despite that she rarely paces and thus Furqan Gosselin not plan for  further adjustments.  2. Chest pain: Remains atypical.  Her pain is lasted a few hours.  At this point I do not feel that her chest pain is cardiac in nature.  3. Palpitations: She had a one-to-one tachycardia that could have been sinus tachycardia versus an atrial tachycardia.  This occurred around the time of her father's passing in December.  She has had no further tachycardias.  We Roney Youtz not make medication changes unless further tachycardias occur.  Current medicines are reviewed at length with the patient today.   The patient does not have concerns regarding her medicines.  The following changes were made today: None  Labs/ tests ordered today include:  No orders of the defined types were placed in this encounter.  Case discussed with primary physician  Disposition:   FU with Page Lancon 6 months  Signed, Reynolds Kittel Jorja Loa, MD  11/23/2018 2:10 PM     [facility] [address] [place] [address] [phone] (office) [phone] (fax)

## 2018-11-24 ENCOUNTER — Encounter: Payer: Self-pay | Admitting: Family Medicine

## 2018-11-24 DIAGNOSIS — J9801 Acute bronchospasm: Secondary | ICD-10-CM

## 2018-11-24 DIAGNOSIS — R0789 Other chest pain: Secondary | ICD-10-CM

## 2018-11-24 DIAGNOSIS — R5383 Other fatigue: Secondary | ICD-10-CM

## 2018-11-24 DIAGNOSIS — R001 Bradycardia, unspecified: Secondary | ICD-10-CM

## 2018-11-24 DIAGNOSIS — Z950 Presence of cardiac pacemaker: Secondary | ICD-10-CM

## 2018-11-25 ENCOUNTER — Ambulatory Visit (INDEPENDENT_AMBULATORY_CARE_PROVIDER_SITE_OTHER): Payer: No Typology Code available for payment source | Admitting: Psychiatry

## 2018-11-25 ENCOUNTER — Encounter: Payer: Self-pay | Admitting: Psychiatry

## 2018-11-25 ENCOUNTER — Other Ambulatory Visit: Payer: Self-pay

## 2018-11-25 VITALS — BP 127/82 | HR 64 | Temp 97.6°F | Wt 184.0 lb

## 2018-11-25 DIAGNOSIS — Z634 Disappearance and death of family member: Secondary | ICD-10-CM | POA: Diagnosis not present

## 2018-11-25 DIAGNOSIS — G4701 Insomnia due to medical condition: Secondary | ICD-10-CM

## 2018-11-25 DIAGNOSIS — F330 Major depressive disorder, recurrent, mild: Secondary | ICD-10-CM | POA: Diagnosis not present

## 2018-11-25 DIAGNOSIS — F411 Generalized anxiety disorder: Secondary | ICD-10-CM | POA: Diagnosis not present

## 2018-11-25 LAB — CUP PACEART INCLINIC DEVICE CHECK
Battery Impedance: 392 Ohm
Battery Remaining Longevity: 117 mo
Battery Voltage: 2.79 V
Brady Statistic AP VP Percent: 0 %
Brady Statistic AS VP Percent: 0 %
Brady Statistic AS VS Percent: 96 %
Date Time Interrogation Session: 20200303184844
Implantable Lead Implant Date: 19960806
Implantable Lead Implant Date: 19960806
Implantable Lead Location: 753859
Implantable Lead Location: 753860
Implantable Lead Model: 4524
Implantable Lead Model: 5034
Implantable Pulse Generator Implant Date: 20110328
Lead Channel Impedance Value: 1052 Ohm
Lead Channel Impedance Value: 263 Ohm
Lead Channel Pacing Threshold Amplitude: 1 V
Lead Channel Pacing Threshold Amplitude: 1.25 V
Lead Channel Pacing Threshold Amplitude: 1.25 V
Lead Channel Pacing Threshold Amplitude: 1.375 V
Lead Channel Pacing Threshold Pulse Width: 0.4 ms
Lead Channel Pacing Threshold Pulse Width: 0.4 ms
Lead Channel Pacing Threshold Pulse Width: 0.4 ms
Lead Channel Pacing Threshold Pulse Width: 0.4 ms
Lead Channel Sensing Intrinsic Amplitude: 4 mV
Lead Channel Setting Pacing Amplitude: 2.75 V
Lead Channel Setting Pacing Pulse Width: 0.4 ms
Lead Channel Setting Sensing Sensitivity: 2 mV
MDC IDC MSMT LEADCHNL RA SENSING INTR AMPL: 2 mV
MDC IDC SET LEADCHNL RA PACING AMPLITUDE: 2.75 V
MDC IDC STAT BRADY AP VS PERCENT: 3 %

## 2018-11-25 MED ORDER — SERTRALINE HCL 25 MG PO TABS: 25.0000 mg | ORAL_TABLET | Freq: Every day | ORAL | 1 refills | Status: DC

## 2018-11-25 MED ORDER — TRAZODONE HCL 100 MG PO TABS: 150.0000 mg | ORAL_TABLET | Freq: Every evening | ORAL | 1 refills | Status: DC | PRN

## 2018-11-25 NOTE — Patient Instructions (Signed)
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
## 2018-11-26 ENCOUNTER — Encounter: Payer: Self-pay | Admitting: Psychiatry

## 2018-11-26 NOTE — Assessment & Plan Note (Signed)
Significant anxiety and stress over relationship with husband; he does not want to partake in counseling she says because then that would look like he's not a good husband; she is seeing a Veterinary surgeon; she is safe; I am here for her; supportive listening provider

## 2018-11-26 NOTE — Assessment & Plan Note (Signed)
Seeing nephrologist; avoiding NSAIDs 

## 2018-11-26 NOTE — Assessment & Plan Note (Signed)
Reviewed last level, well under 400; monthly B12 injections here; this may help fatigue and memory as well

## 2018-11-26 NOTE — Assessment & Plan Note (Signed)
Patient is back on statin; due for recheck lipids soon

## 2018-12-01 ENCOUNTER — Encounter: Payer: Self-pay | Admitting: Family Medicine

## 2018-12-01 ENCOUNTER — Other Ambulatory Visit: Payer: Self-pay

## 2018-12-01 ENCOUNTER — Ambulatory Visit (INDEPENDENT_AMBULATORY_CARE_PROVIDER_SITE_OTHER): Payer: No Typology Code available for payment source | Admitting: Family Medicine

## 2018-12-01 VITALS — BP 120/80 | HR 67 | Temp 97.9°F | Resp 16 | Ht 66.0 in | Wt 181.6 lb

## 2018-12-01 DIAGNOSIS — H6983 Other specified disorders of Eustachian tube, bilateral: Secondary | ICD-10-CM | POA: Diagnosis not present

## 2018-12-01 DIAGNOSIS — H6591 Unspecified nonsuppurative otitis media, right ear: Secondary | ICD-10-CM

## 2018-12-01 DIAGNOSIS — N644 Mastodynia: Secondary | ICD-10-CM | POA: Diagnosis not present

## 2018-12-01 MED ORDER — FLUTICASONE PROPIONATE 50 MCG/ACT NA SUSP: 2.0000 | Freq: Every day | NASAL | 3 refills | Status: DC

## 2018-12-01 MED ORDER — LORATADINE 10 MG PO TABS: 10.0000 mg | ORAL_TABLET | Freq: Every day | ORAL | 1 refills | Status: DC

## 2018-12-01 NOTE — Progress Notes (Addendum)
Name: Emma Rogers   MRN: [id number]    DOB: 01-Aug-1966   Date:12/01/2018       Progress Note  Subjective  Chief Complaint  Chief Complaint  Patient presents with  . Ear Fullness    HPI  Pt presents with concern for sensation of ear fullness.  She notes intermittent feeling of ear fullness, eyes have been watery.  Has had some body aches - more so on the LEFT breast and in her underarm.  Last mammogram was in June 2019 and was normal. Taking singulair, not on nasal spray or antihistamine.  This morning her ear fullness was quite severe, did not have any vertigo, but had some nausea.  No V/D, sore throat, cough, sinus pain/pressure, rhinorrhea.  Has had ongoing hoarse voice for about a year.  Patient Active Problem List   Diagnosis Date Noted  . Chronic venous insufficiency 03/10/2018  . Lymphedema 03/10/2018  . Elevated blood pressure reading 10/13/2017  . Anxiety 10/13/2017  . Familial hyperlipidemia 10/10/2017  . Bilateral leg edema 09/03/2017  . Depression 09/03/2017  . Chronic kidney disease, stage III (moderate) (HCC) 09/03/2017  . Vitamin D deficiency 09/03/2017  . Vitamin B12 deficiency 09/03/2017  . Medication monitoring encounter 09/03/2017  . Insomnia 08/11/2017  . Menopausal symptoms 08/11/2017  . Shortness of breath 05/18/2017  . Chest pain 03/31/2017  . Migraine without aura and without status migrainosus, not intractable 02/21/2017  . Prediabetes 02/20/2017  . Fatigue 02/20/2017  . 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 VASOVAGAL SYNCOPE 11/27/2009  . Allergic rhinitis 01/18/2008  . HEADACHE 01/18/2008    Social History   Tobacco Use  . Smoking status: Never Smoker  . Smokeless tobacco: Never Used  Substance Use Topics  . Alcohol use: No    Alcohol/week: 0.0 standard drinks     Current Outpatient Medications:  .  Albuterol Sulfate (PROAIR RESPICLICK) 108 (90 Base) MCG/ACT AEPB, Inhale 1-2 puffs into the lungs every 4 (four) hours as needed., Disp: 1 each,  Rfl: 2 .  atorvastatin (LIPITOR) 40 MG tablet, Take 1 tablet (40 mg total) by mouth at bedtime., Disp: 30 tablet, Rfl: 1 .  buPROPion (WELLBUTRIN XL) 150 MG 24 hr tablet, TAKE 2 TABLETS BY MOUTH DAILY, Disp: 60 tablet, Rfl: 2 .  cyanocobalamin (,VITAMIN B-12,) 1000 MCG/ML injection, Inject 1 mL (1,000 mcg total) into the muscle every 30 (thirty) days., Disp: , Rfl:  .  furosemide (LASIX) 20 MG tablet, , Disp: , Rfl: 3 .  montelukast (SINGULAIR) 10 MG tablet, TAKE 1 TABLET (10 MG TOTAL) BY MOUTH AT BEDTIME., Disp: 30 tablet, Rfl: 11 .  rizatriptan (MAXALT) 10 MG tablet, Take 1 tablet (10 mg total) by mouth as needed for migraine. May repeat in 2 hours if needed, Disp: 10 tablet, Rfl: 1 .  sertraline (ZOLOFT) 25 MG tablet, Take 1 tablet (25 mg total) by mouth daily with breakfast., Disp: 30 tablet, Rfl: 1 .  topiramate (TOPAMAX) 100 MG tablet, Take 1 tablet (100 mg total) by mouth daily., Disp: 30 tablet, Rfl: 5 .  traZODone (DESYREL) 100 MG tablet, Take 1.5 tablets (150 mg total) by mouth at bedtime as needed. for sleep, Disp: 45 tablet, Rfl: 1 .  Vitamin D, Ergocalciferol, (DRISDOL) 1.25 MG (50000 UT) CAPS capsule, Take 1 capsule (50,000 Units total) by mouth every 7 (seven) days., Disp: 4 capsule, Rfl: 0  Allergies  Allergen Reactions  . Amoxicillin Other (See Comments)  . Clavulanic Acid Other (See Comments)  . Morphine And Related  vomiting    I personally reviewed active problem list, medication list, allergies, notes from last encounter, lab results with the patient/caregiver today.  ROS  Ten systems reviewed and is negative except as mentioned in HPI  Objective  Vitals:   12/01/18 1136  BP: 120/80  Pulse: 67  Resp: 16  Temp: 97.9 F (36.6 C)  TempSrc: Oral  SpO2: 99%  Weight: 181 lb 9.6 oz (82.4 kg)  Height: 5\' 6"  (1.676 m)    Body mass index is 29.31 kg/m.  Nursing Note and Vital Signs reviewed.  Physical Exam  Constitutional: Patient appears well-developed  and well-nourished. No distress.  HENT: Head: Normocephalic and atraumatic. Ears: bilateral TMs with RIGHT sided effusion, LEFT TM is slightly bulging, no erythema bilaterally.  Nose: Nose normal. Mouth/Throat: Oropharynx is clear and moist. No oropharyngeal exudate or tonsillar swelling.  Eyes: Conjunctivae and EOM are normal. No scleral icterus.  Pupils are equal, round, and reactive to light.  Neck: Normal range of motion. Neck supple. No JVD present. No thyromegaly present.  Cardiovascular: Normal rate, regular rhythm and normal heart sounds.  No murmur heard. No BLE edema. Pulmonary/Chest: Effort normal and breath sounds normal. No respiratory distress. Musculoskeletal: Normal range of motion, no joint effusions. No gross deformities Neurological: Pt is alert and oriented to person, place, and time. No cranial nerve deficit. Coordination, balance, strength, speech and gait are normal.  Skin: Skin is warm and dry. No rash noted. No erythema.  Psychiatric: Patient has a normal mood and affect. behavior is normal. Judgment and thought content normal. Breast: Normal nipples bilaterally - no discharge, normal breasts bilaterally - no masses palpated, no skin discoloration.    No results found for this or any previous visit (from the past 72 hour(s)).  Assessment & Plan  1. Eustachian tube dysfunction, bilateral - loratadine (CLARITIN) 10 MG tablet; Take 1 tablet (10 mg total) by mouth daily.  Dispense: 90 tablet; Refill: 1 - fluticasone (FLONASE) 50 MCG/ACT nasal spray; Place 2 sprays into both nostrils daily.  Dispense: 16 g; Refill: 3  2. Middle ear effusion, right - loratadine (CLARITIN) 10 MG tablet; Take 1 tablet (10 mg total) by mouth daily.  Dispense: 90 tablet; Refill: 1 - fluticasone (FLONASE) 50 MCG/ACT nasal spray; Place 2 sprays into both nostrils daily.  Dispense: 16 g; Refill: 3  3. Breast pain, left - MM Digital Diagnostic Unilat L; Future - MM Digital Diagnostic Unilat R;  Future  -Red flags and when to present for emergency care or RTC including fever >101.60F, chest pain, shortness of breath, new/worsening/un-resolving symptoms, reviewed with patient at time of visit. Follow up and care instructions discussed and provided in AVS.

## 2018-12-01 NOTE — Addendum Note (Signed)
Addended by: Maurice Small E on: 12/01/2018 12:00 PM   Modules accepted: Orders

## 2018-12-02 ENCOUNTER — Other Ambulatory Visit: Payer: Self-pay | Admitting: Family Medicine

## 2018-12-02 DIAGNOSIS — N644 Mastodynia: Secondary | ICD-10-CM

## 2018-12-03 ENCOUNTER — Encounter: Payer: Self-pay | Admitting: Family Medicine

## 2018-12-03 NOTE — Telephone Encounter (Signed)
Please call [facility] to ensure they have all orders that they need.

## 2018-12-06 ENCOUNTER — Ambulatory Visit (INDEPENDENT_AMBULATORY_CARE_PROVIDER_SITE_OTHER): Payer: No Typology Code available for payment source | Admitting: Psychiatry

## 2018-12-06 ENCOUNTER — Other Ambulatory Visit: Payer: Self-pay

## 2018-12-06 ENCOUNTER — Encounter: Payer: Self-pay | Admitting: Psychiatry

## 2018-12-06 VITALS — BP 133/87 | HR 62 | Temp 98.9°F | Wt 185.2 lb

## 2018-12-06 DIAGNOSIS — F411 Generalized anxiety disorder: Secondary | ICD-10-CM

## 2018-12-06 DIAGNOSIS — Z634 Disappearance and death of family member: Secondary | ICD-10-CM | POA: Diagnosis not present

## 2018-12-06 DIAGNOSIS — F330 Major depressive disorder, recurrent, mild: Secondary | ICD-10-CM

## 2018-12-06 DIAGNOSIS — G4701 Insomnia due to medical condition: Secondary | ICD-10-CM

## 2018-12-06 MED ORDER — SERTRALINE HCL 50 MG PO TABS: 50.0000 mg | ORAL_TABLET | Freq: Every day | ORAL | 1 refills | Status: DC
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
## 2018-12-08 ENCOUNTER — Ambulatory Visit
Admission: RE | Admit: 2018-12-08 | Discharge: 2018-12-08 | Disposition: A | Discharge status: Home / Self Care | Payer: No Typology Code available for payment source | Source: Ambulatory Visit | Attending: Family Medicine | Admitting: Family Medicine

## 2018-12-08 ENCOUNTER — Other Ambulatory Visit: Payer: Self-pay

## 2018-12-08 DIAGNOSIS — N644 Mastodynia: Secondary | ICD-10-CM | POA: Insufficient documentation

## 2018-12-09 ENCOUNTER — Telehealth: Payer: Self-pay

## 2018-12-09 NOTE — Telephone Encounter (Signed)
Called patient for COVID-19 screening.  Have you recently traveled any where out of the local area in the last 2 weeks? no  Have you been in close contact with a person diagnosed with COVID-19 within the last 2 weeks? no  Do you currently have any fever, cough, or shortness of breath? Shortness of breath, no fever, has asthma.   Okay to proceed with visit.

## 2018-12-10 ENCOUNTER — Ambulatory Visit (INDEPENDENT_AMBULATORY_CARE_PROVIDER_SITE_OTHER): Payer: No Typology Code available for payment source | Admitting: Internal Medicine

## 2018-12-10 ENCOUNTER — Telehealth: Payer: Self-pay | Admitting: Internal Medicine

## 2018-12-10 ENCOUNTER — Other Ambulatory Visit: Payer: Self-pay

## 2018-12-10 ENCOUNTER — Encounter: Payer: Self-pay | Admitting: Internal Medicine

## 2018-12-10 VITALS — BP 124/82 | HR 65 | Ht 66.0 in

## 2018-12-10 DIAGNOSIS — J4521 Mild intermittent asthma with (acute) exacerbation: Secondary | ICD-10-CM

## 2018-12-10 DIAGNOSIS — J181 Lobar pneumonia, unspecified organism: Secondary | ICD-10-CM | POA: Diagnosis not present

## 2018-12-10 DIAGNOSIS — J9801 Acute bronchospasm: Secondary | ICD-10-CM

## 2018-12-10 MED ORDER — MOMETASONE FUROATE 50 MCG/ACT NA SUSP: 2.0000 | Freq: Every day | NASAL | 2 refills | Status: DC

## 2018-12-10 MED ORDER — LEVOFLOXACIN 500 MG PO TABS: 500.0000 mg | ORAL_TABLET | Freq: Every day | ORAL | 0 refills | Status: AC

## 2018-12-10 MED ORDER — BUDESONIDE-FORMOTEROL FUMARATE 80-4.5 MCG/ACT IN AERO: 2.0000 | INHALATION_SPRAY | Freq: Two times a day (BID) | RESPIRATORY_TRACT | 12 refills | Status: DC

## 2018-12-10 MED ORDER — ALBUTEROL SULFATE 108 (90 BASE) MCG/ACT IN AEPB: 1.0000 | INHALATION_SPRAY | RESPIRATORY_TRACT | 2 refills | Status: DC | PRN

## 2018-12-10 MED ORDER — PREDNISONE 20 MG PO TABS: 20.0000 mg | ORAL_TABLET | Freq: Every day | ORAL | 0 refills | Status: DC

## 2018-12-10 NOTE — Progress Notes (Signed)
Name: Emma Rogers MRN: [id number] DOB: 04/14/1966     CONSULTATION DATE: 12/10/2018  REFERRING MD : [name]  CHIEF COMPLAINT: SOB  HISTORY OF PRESENT ILLNESS: 53 year old pleasant white female seen today for asthma Patient diagnosed with asthma as a child Last prednisone dosage was 1 year ago  Patient with progressive cough with intermittent wheezing with some shortness of breath and dyspnea on exertion has been having some left-sided pleurisy  Triggers include dogs pet dander pollen smells paints molds and lotion Patient is exposed to dogs at home  No evidence of GERD  Patient does not have any acute respiratory distress at this time It seems that her asthma is not under control  Patient previously has been on Breo albuterol Singulair Combivent and Flonase  Albuterol as needed helps  Patient is a non-smoker Patient with a history of CAD with pacemaker in place   Physical exam shows left-sided crackles at the base most likely has mucoid impaction with probable underlying pneumonia   Patient denies having any fevers or chills or night sweats     PAST MEDICAL HISTORY :   has a past medical history of Allergic rhinitis, Anxiety, Asthma, Depression, Headache(784.0), Renal insufficiency, and Shingles.  has a past surgical history that includes Lobe hemithroidectomy; Insert / replace / remove pacemaker; Abdominal hysterectomy (2009); and Breast biopsy (Left, 09/30/2002). Prior to Admission medications   Medication Sig Start Date End Date Taking? Authorizing Provider  Albuterol Sulfate (PROAIR RESPICLICK) 108 (90 Base) MCG/ACT AEPB Inhale 1-2 puffs into the lungs every 4 (four) hours as needed. 07/12/18   Doren Custard, FNP  atorvastatin (LIPITOR) 40 MG tablet Take 1 tablet (40 mg total) by mouth at bedtime. 11/04/18   Kerman Passey, MD  buPROPion (WELLBUTRIN XL) 150 MG 24 hr tablet TAKE 2 TABLETS BY MOUTH DAILY 08/21/18   [name], [name], MD  cyanocobalamin (,VITAMIN  B-12,) 1000 MCG/ML injection Inject 1 mL (1,000 mcg total) into the muscle every 30 (thirty) days. 11/03/18   Kerman Passey, MD  fluticasone (FLONASE) 50 MCG/ACT nasal spray Place 2 sprays into both nostrils daily. 12/01/18   Doren Custard, FNP  furosemide (LASIX) 20 MG tablet  02/11/18   [provider]  loratadine (CLARITIN) 10 MG tablet Take 1 tablet (10 mg total) by mouth daily. 12/01/18   Doren Custard, FNP  montelukast (SINGULAIR) 10 MG tablet TAKE 1 TABLET (10 MG TOTAL) BY MOUTH AT BEDTIME. 10/06/17   [name], [name], MD  rizatriptan (MAXALT) 10 MG tablet Take 1 tablet (10 mg total) by mouth as needed for migraine. May repeat in 2 hours if needed 08/17/18   [name], NP  sertraline (ZOLOFT) 50 MG tablet Take 1 tablet (50 mg total) by mouth daily. 12/06/18   [name], MD  topiramate (TOPAMAX) 100 MG tablet Take 1 tablet (100 mg total) by mouth daily. 08/17/18   [name], [name], NP  traZODone (DESYREL) 100 MG tablet Take 1.5 tablets (150 mg total) by mouth at bedtime as needed. for sleep 11/25/18   [name], MD  Vitamin D, Ergocalciferol, (DRISDOL) 1.25 MG (50000 UT) CAPS capsule Take 1 capsule (50,000 Units total) by mouth every 7 (seven) days. 11/03/18   Kerman Passey, MD   Allergies  Allergen Reactions  . Amoxicillin Other (See Comments)  . Clavulanic Acid Other (See Comments)  . Morphine And Related     vomiting    FAMILY HISTORY:  family history includes Asthma in  an other family member; Breast cancer in her maternal grandmother; COPD in her maternal grandmother; Coronary artery disease in an other family member; Heart disease in her father, maternal grandfather, and paternal grandfather; Liver disease in her paternal grandmother; Lung cancer in an other family member; Lung disease in her father; Prostate cancer in an other family member. SOCIAL HISTORY:  reports that she has never smoked. She has never used smokeless tobacco. She reports that she  does not drink alcohol or use drugs.  REVIEW OF SYSTEMS:   Constitutional: Negative for fever, chills, weight loss, malaise/fatigue and diaphoresis.  HENT: Negative for hearing loss, ear pain, nosebleeds, congestion, sore throat, neck pain, tinnitus and ear discharge.   Eyes: Negative for blurred vision, double vision, photophobia, pain, discharge and redness.  Respiratory: Negative for cough, hemoptysis, sputum production, shortness of breath, wheezing and stridor.   Cardiovascular: Negative for chest pain, palpitations, orthopnea, claudication, leg swelling and PND.  Gastrointestinal: Negative for heartburn, nausea, vomiting, abdominal pain, diarrhea, constipation, blood in stool and melena.  Genitourinary: Negative for dysuria, urgency, frequency, hematuria and flank pain.  Musculoskeletal: Negative for myalgias, back pain, joint pain and falls.  Skin: Negative for itching and rash.  Neurological: Negative for dizziness, tingling, tremors, sensory change, speech change, focal weakness, seizures, loss of consciousness, weakness and headaches.  Endo/Heme/Allergies: Negative for environmental allergies and polydipsia. Does not bruise/bleed easily.  ALL OTHER ROS ARE NEGATIVE   BP 124/82 (BP Location: Left Arm, Cuff Size: Normal)   Pulse 65   Ht 5\' 6"  (1.676 m)   SpO2 99%   BMI 29.89 kg/m   Physical Examination:   GENERAL:NAD, no fevers, chills, no weakness no fatigue HEAD: Normocephalic, atraumatic.  EYES: Pupils equal, round, reactive to light. Extraocular muscles intact. No scleral icterus.  MOUTH: Moist mucosal membrane.   EAR, NOSE, THROAT: Clear without exudates. No external lesions.  NECK: Supple. No thyromegaly. No nodules. No JVD.  PULMONARY:CTA B/L no wheezes, +crackles, no rhonchi CARDIOVASCULAR: S1 and S2. Regular rate and rhythm. No murmurs, rubs, or gallops. No edema.  GASTROINTESTINAL: Soft, nontender, nondistended. No masses. Positive bowel sounds.  MUSCULOSKELETAL:  No swelling, clubbing, or edema. Range of motion full in all extremities.  NEUROLOGIC: Cranial nerves II through XII are intact. No gross focal neurological deficits.  SKIN: No ulceration, lesions, rashes, or cyanosis. Skin warm and dry. Turgor intact.  PSYCHIATRIC: Mood, affect within normal limits. The patient is awake, alert and oriented x 3. Insight, judgment intact.        IMAGING     2.19.19 I have Independently reviewed images of  CXR on 12/10/2018 Interpretation: internal device inplace No effusions No edema No pneumonia     ASSESSMENT AND PLAN SYNOPSIS  53 year old pleasant white female seen today for chronic asthma that does not seem to be well-controlled at this time in the setting of probable underlying left-sided pneumonia with pleurisy and shortness of breath consistent with mild asthma exacerbation in the setting of chronic allergic rhinitis  Mild asthma exacerbation with probable underlying left-sided pneumonia Prednisone 20 mg for 10 days  Levaquin 500 mg for 7 days   Asthma not controlled Patient needs to avoid triggers Start Symbicort 80 twice daily Albuterol 2 to 4 puffs every 4 hours as needed   Allergic rhinitis Start Nasonex and continue Claritin      Patient satisfied with Plan of action and management. All questions answered Follow-up in 4 weeks   Zhoe Catania Santiago Glad, M.D.  [facility]  Medical Director [place] Director [facility] Cardio-Pulmonary Department

## 2018-12-10 NOTE — Telephone Encounter (Signed)
Albuterol sent to [facility] pharmacy. Nothing further needed.

## 2018-12-10 NOTE — Patient Instructions (Addendum)
FOR ASTHMA- START SYMBICORT ALBUTEROL 2-4 puffs every 4 hrs as needed AVOID TRIGGERS AND DOGS    For POSSIBLE PNEUMONIA PREDNISONE 20 mg daily for 10 days Levaquin 500 mg daily for 7 days

## 2018-12-17 ENCOUNTER — Ambulatory Visit: Payer: No Typology Code available for payment source | Admitting: Licensed Clinical Social Worker

## 2018-12-18 LAB — CUP PACEART REMOTE DEVICE CHECK
Battery Remaining Longevity: 119 mo
Battery Voltage: 2.79 V
Brady Statistic AP VP Percent: 0 %
Brady Statistic AP VS Percent: 3 %
Brady Statistic AS VP Percent: 0 %
Brady Statistic AS VS Percent: 96 %
Date Time Interrogation Session: 20200227194919
Implantable Lead Implant Date: 19960806
Implantable Lead Implant Date: 19960806
Implantable Lead Location: 753859
Implantable Lead Location: 753860
Implantable Lead Model: 4524
Implantable Pulse Generator Implant Date: 20110328
Lead Channel Impedance Value: 1120 Ohm
Lead Channel Impedance Value: 267 Ohm
Lead Channel Pacing Threshold Amplitude: 1.125 V
Lead Channel Pacing Threshold Pulse Width: 0.4 ms
Lead Channel Setting Pacing Amplitude: 2.75 V
Lead Channel Setting Pacing Amplitude: 2.75 V
Lead Channel Setting Pacing Pulse Width: 0.4 ms
Lead Channel Setting Sensing Sensitivity: 2 mV
MDC IDC MSMT BATTERY IMPEDANCE: 368 Ohm
MDC IDC MSMT LEADCHNL RA PACING THRESHOLD AMPLITUDE: 1.375 V
MDC IDC MSMT LEADCHNL RA PACING THRESHOLD PULSEWIDTH: 0.4 ms

## 2018-12-22 ENCOUNTER — Telehealth: Payer: Self-pay | Admitting: Internal Medicine

## 2018-12-22 NOTE — Telephone Encounter (Signed)
Pt ok with televisit if necessary.

## 2018-12-22 NOTE — Telephone Encounter (Signed)
Recommend COVID testing  I was told that [facility] is testing Recommend Self Daleen Snook

## 2018-12-22 NOTE — Telephone Encounter (Signed)
Returned call to patient, she states since completing antibiotic abnd prednisone she is still SOB, with dry cough, chest soreness, pressure and tightness. Denies fever, states she works for a [employer] but has not been in direct contact with anyone, they have been doing phone/virtual visits. She is using albuterol and Symbicort daily. She states she has chronic kidney disease stage 3. Most recent GFR was 48. States over the last week she has used her albuterol about 4-5 times. Requesting recommnedations from Dr. Belia Heman.   Dr. Belia Heman please advise. Thanks.

## 2018-12-22 NOTE — Telephone Encounter (Signed)
Pt is aware of below recommendations and voiced her understanding. I have contacted [facility], who verified that they are not currently doing covid testing.  I have made Dr. Belia Heman aware of this information, who recommends that pt go to ED if sx worsen.  Nothing further is needed.

## 2018-12-23 ENCOUNTER — Ambulatory Visit: Payer: Self-pay | Admitting: Family Medicine

## 2018-12-29 ENCOUNTER — Telehealth: Payer: Self-pay | Admitting: *Deleted

## 2018-12-29 NOTE — Telephone Encounter (Signed)
Called, LVM again for pt to call.  I also called work number: [phone]. Unable to LVM for her, it was a clinic number, not personal phone.  I tried Royetta Car at [phone] on file. LVM asking pt call office back prior to her appt tomorrow morning.

## 2018-12-29 NOTE — Telephone Encounter (Signed)
Called, LVM for pt returning her call. 

## 2018-12-29 NOTE — Telephone Encounter (Signed)
Pt has returned the call to RN Kara Mead, she is asking for a call back to confirm info for the appointment on tomorrow morning @ 10:30

## 2018-12-29 NOTE — Telephone Encounter (Signed)
Called, LVM for pt to call back so I can update her chart prior to her virtual visit tomorrow with Dr. Epimenio Foot at 10:30am.

## 2018-12-30 ENCOUNTER — Other Ambulatory Visit: Payer: Self-pay

## 2018-12-30 ENCOUNTER — Ambulatory Visit (INDEPENDENT_AMBULATORY_CARE_PROVIDER_SITE_OTHER): Payer: No Typology Code available for payment source | Admitting: Neurology

## 2018-12-30 ENCOUNTER — Encounter: Payer: Self-pay | Admitting: Neurology

## 2018-12-30 DIAGNOSIS — M4802 Spinal stenosis, cervical region: Secondary | ICD-10-CM | POA: Insufficient documentation

## 2018-12-30 DIAGNOSIS — G250 Essential tremor: Secondary | ICD-10-CM | POA: Insufficient documentation

## 2018-12-30 DIAGNOSIS — R29898 Other symptoms and signs involving the musculoskeletal system: Secondary | ICD-10-CM | POA: Insufficient documentation

## 2018-12-30 DIAGNOSIS — G43009 Migraine without aura, not intractable, without status migrainosus: Secondary | ICD-10-CM

## 2018-12-30 DIAGNOSIS — R3915 Urgency of urination: Secondary | ICD-10-CM | POA: Insufficient documentation

## 2018-12-30 NOTE — Telephone Encounter (Signed)
pts husband called in and stated they are confirming appt

## 2018-12-30 NOTE — Telephone Encounter (Signed)
Tried calling husband # and went to VM. Tried home number, got busy signal
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
## 2019-01-03 ENCOUNTER — Other Ambulatory Visit: Payer: Self-pay

## 2019-01-03 ENCOUNTER — Ambulatory Visit: Payer: No Typology Code available for payment source | Admitting: Psychiatry

## 2019-01-03 ENCOUNTER — Telehealth: Payer: Self-pay | Admitting: Neurology

## 2019-01-03 NOTE — Telephone Encounter (Signed)
01/03/19 - LVM to schedule 3 month follow-up with Dr. Epimenio Foot

## 2019-01-04 ENCOUNTER — Telehealth: Payer: Self-pay | Admitting: Neurology

## 2019-01-04 NOTE — Telephone Encounter (Signed)
[facility] order sent to GI. it has to be schedule before i can do the authorization per [facility]. They will reach out to the pt to schedule.

## 2019-01-06 ENCOUNTER — Ambulatory Visit
Admission: RE | Admit: 2019-01-06 | Discharge: 2019-01-06 | Disposition: A | Discharge status: Home / Self Care | Payer: No Typology Code available for payment source | Attending: Internal Medicine | Admitting: Internal Medicine

## 2019-01-06 ENCOUNTER — Encounter: Payer: Self-pay | Admitting: Family Medicine

## 2019-01-06 ENCOUNTER — Ambulatory Visit (INDEPENDENT_AMBULATORY_CARE_PROVIDER_SITE_OTHER): Payer: No Typology Code available for payment source | Admitting: Family Medicine

## 2019-01-06 ENCOUNTER — Other Ambulatory Visit: Payer: Self-pay

## 2019-01-06 ENCOUNTER — Telehealth: Payer: Self-pay

## 2019-01-06 ENCOUNTER — Ambulatory Visit
Admission: RE | Admit: 2019-01-06 | Discharge: 2019-01-06 | Disposition: A | Discharge status: Home / Self Care | Payer: No Typology Code available for payment source | Source: Ambulatory Visit | Attending: Internal Medicine | Admitting: Internal Medicine

## 2019-01-06 ENCOUNTER — Other Ambulatory Visit: Payer: Self-pay | Admitting: Internal Medicine

## 2019-01-06 DIAGNOSIS — R059 Cough, unspecified: Secondary | ICD-10-CM

## 2019-01-06 DIAGNOSIS — G250 Essential tremor: Secondary | ICD-10-CM | POA: Diagnosis not present

## 2019-01-06 DIAGNOSIS — R29898 Other symptoms and signs involving the musculoskeletal system: Secondary | ICD-10-CM

## 2019-01-06 DIAGNOSIS — Z5181 Encounter for therapeutic drug level monitoring: Secondary | ICD-10-CM

## 2019-01-06 DIAGNOSIS — E7849 Other hyperlipidemia: Secondary | ICD-10-CM

## 2019-01-06 DIAGNOSIS — R05 Cough: Secondary | ICD-10-CM | POA: Insufficient documentation

## 2019-01-06 DIAGNOSIS — M4802 Spinal stenosis, cervical region: Secondary | ICD-10-CM

## 2019-01-06 DIAGNOSIS — N183 Chronic kidney disease, stage 3 unspecified: Secondary | ICD-10-CM

## 2019-01-06 DIAGNOSIS — R7303 Prediabetes: Secondary | ICD-10-CM

## 2019-01-06 DIAGNOSIS — F419 Anxiety disorder, unspecified: Secondary | ICD-10-CM

## 2019-01-06 DIAGNOSIS — E538 Deficiency of other specified B group vitamins: Secondary | ICD-10-CM

## 2019-01-06 NOTE — Assessment & Plan Note (Signed)
With neurologist

## 2019-01-06 NOTE — Assessment & Plan Note (Signed)
Check lipids, orders entered, limit saturated fats

## 2019-01-06 NOTE — Assessment & Plan Note (Signed)
Managed by neurologist

## 2019-01-06 NOTE — Assessment & Plan Note (Signed)
Managed by neurologist; no beta-blocker at this point; caffeine, sleep deprivation, and being overly tired worsen symptoms

## 2019-01-06 NOTE — Assessment & Plan Note (Signed)
Managed by Dr. Cherylann Ratel, GFR 48; avoiding NSAIDs; hydrating

## 2019-01-06 NOTE — Telephone Encounter (Signed)
Labs faxed.   Copied from CRM [id number]. Topic: General - Other >> Jan 06, 2019 11:24 AM Darron Doom wrote: Reason for CRM: Patient called to ask for lab orders to be faxed so she can have this blood work done at the [employer] where she works. Fax# [phone]

## 2019-01-06 NOTE — Addendum Note (Signed)
Addended by: Yandel Zeiner M on: 01/06/2019 03:50 PM   Modules accepted: Orders  

## 2019-01-06 NOTE — Assessment & Plan Note (Signed)
Once monthly B12 injections; patient will schedule appt

## 2019-01-06 NOTE — Assessment & Plan Note (Signed)
Working with Dr. Elna Breslow; she reports doing better; sleeping better; she is safe

## 2019-01-06 NOTE — Addendum Note (Signed)
Addended by: Stephannie Peters on: 01/06/2019 03:50 PM   Modules accepted: Orders

## 2019-01-06 NOTE — Progress Notes (Signed)
There were no vitals taken for this visit.   Subjective:    Patient ID: Emma Rogers, female    DOB: Sep 02, 1966, 53 y.o.   MRN: [id number]  HPI: Emma Rogers is a 53 y.o. female  Chief Complaint  Patient presents with  . Follow-up    HPI Virtual Visit via Telephone/Video Note   I connected with the patient via:  Doximity video I verified that I am speaking with the correct person using two identifiers.  Call started: 8:40 Call terminated: 9:09 am Total length of call: 29 minutes   I discussed the limitations, risks, and privacy concerns of performing an evaluation and management service by telephone and the availability of in-person appointments. I explained that he/she may be responsible for charges related to this service. The patient expressed understanding and agreed to proceed.  Provider location: home, upstairs office with door closed, earphones/headset on Patient location: home Additional participants: husband is there, may overhear  Since her last visit, she has seen Dr. Belia Heman (pulm) and Dr. Epimenio Foot (neuro) Dr. Belia Heman said her asthma was not controlled; he found pneumonia in her LLL; she did not have a CXR; he diagnosed it clinically; pharmacy asked about the levaquin dose; he put her on prednisone as well; it was because of kidney disease that he was being careful about her doses; she is still having pain in the side when raising arm; not coughing constantly; they had talked about allergens at that visit; she has dogs, the pollen is terrible right now; worse in the morning; she is coughing some at work now; not coughing all the time, just intermittent; not enough that co-workers are worried; a few weeks ago she called Dr. Belia Heman and said she might need another round of antibiotics because her sx weren't gone; he felt it might be covid-19 and quarantine herself; patient called [employer] at work, screened her, checking temp, they didn't think it was covid-19; husband has not  gotten sick at all; she says that it just hasn't gotten better; [place] won't let her deal with patients face-to-face because of her kidney disease and asthma; she does feel short of breath, not constant  Dr. Cherylann Ratel called her and said that her kidney function dropped to 48; staying away from NSAIDs; good water drinker  The visit with Dr. Epimenio Foot was over the phone; he thinks the tremor is benign essential tremor; he is going to get a CT of neck and thoracic spine; she had a disc problem them; she cannot have an MRI; she is concerned about C5-6; they usually treat with beta-blocker, but she has asthma; she is okay not taking medicine at this time  Dr. Elna Breslow is treating depression and anxiety and sleeping better; things are getting better with her husband; she is safe  Prediabetes; will check A1c when COVID-19 pandemic slows down or she can have done at her work  Lab Results  Component Value Date   HGBA1C 5.8 (H) 12/03/2017   High cholesterol Lab Results  Component Value Date   CHOL 239 (H) 11/01/2018   HDL 55 11/01/2018   LDLCALC 162 (H) 11/01/2018   LDLDIRECT 178.3 02/13/2012   TRIG 107 11/01/2018   CHOLHDL 4.3 11/01/2018   Vitamin D deficiency and vitamin B12 deficiency; found on labs in February   Depression screen [facility] 2/9 01/06/2019 12/01/2018 11/22/2018 11/18/2018 11/01/2018  Decreased Interest 2 0 0 1 0  Down, Depressed, Hopeless 2 1 0 1 2  PHQ - 2 Score 4  1 0 2 2  Altered sleeping 0 0 0 - 3  Tired, decreased energy 3 1 0 2 3  Change in appetite 0 0 0 2 0  Feeling bad or failure about yourself  3 0 0 1 1  Trouble concentrating 2 1 0 2 3  Moving slowly or fidgety/restless 2 0 0 1 0  Suicidal thoughts 0 0 0 0 0  PHQ-9 Score 14 3 0 - 12  Difficult doing work/chores Somewhat difficult Not difficult at all Not difficult at all Somewhat difficult Somewhat difficult  Some recent data might be hidden   Fall Risk  01/06/2019 11/22/2018 11/18/2018 11/01/2018 10/18/2018  Falls in the past  year? 0 0  Number falls in past yr: - 0  Injury with Fall? - 0 0 0 0  Risk for fall due to : - - History of fall(s) - -  Follow up - - Falls evaluation completed - -    Relevant past medical, surgical, family and social history reviewed Past Medical History:  Diagnosis Date  . Allergic rhinitis   . Anxiety   . Asthma   . Depression   . Headache(784.0)   . Renal insufficiency    Stage 3 kidney disease.   . Shingles    Past Surgical History:  Procedure Laterality Date  . ABDOMINAL HYSTERECTOMY  2009   still has 1 ovary  . BREAST BIOPSY Left 09/30/2002   Ductogram-neg  . INSERT / REPLACE / REMOVE PACEMAKER     Permanent pacemaker, Dr. [name]  . Lobe hemithroidectomy     Right   Family History  Problem Relation Age of Onset  . Heart disease Father        pacemaker,arrythmias  . Lung disease Father   . Asthma Other   . Coronary artery disease Other        Female 1st degree relative <50  . Lung cancer Other   . Prostate cancer Other        1st degree relative <50  . COPD Maternal Grandmother   . Breast cancer Maternal Grandmother   . Heart disease Maternal Grandfather   . Liver disease Paternal Grandmother   . Heart disease Paternal Grandfather    Social History   Tobacco Use  . Smoking status: Never Smoker  . Smokeless tobacco: Never Used  Substance Use Topics  . Alcohol use: No    Alcohol/week: 0.0 standard drinks  . Drug use: No     Office Visit from 01/06/2019 in [facility]  AUDIT-C Score  0      Interim medical history since last visit reviewed. Allergies and medications reviewed  Review of Systems Per HPI unless specifically indicated above     Objective:    There were no vitals taken for this visit.  Wt Readings from Last 3 Encounters:  12/01/18 181 lb 9.6 oz (82.4 kg)  11/23/18 183 lb (83 kg)  11/22/18 179 lb 4.8 oz (81.3 kg)    Physical Exam Constitutional:      Appearance: Normal appearance.  Eyes:      Extraocular Movements: Extraocular movements intact.     Right eye: Normal extraocular motion.     Left eye: Normal extraocular motion.  Pulmonary:     Effort: No tachypnea or respiratory distress.  Skin:    Coloration: Skin is not jaundiced or pale.  Neurological:     Mental Status: She is alert.     Cranial  Nerves: No dysarthria.  Psychiatric:        Mood and Affect: Mood is not anxious or depressed.        Speech: Speech is not rapid and pressured, delayed or slurred.       Assessment & Plan:   Problem List Items Addressed This Visit      Nervous and Auditory   Left leg weakness    With neurologist      Benign essential tremor    Managed by neurologist; no beta-blocker at this point; caffeine, sleep deprivation, and being overly tired worsen symptoms        Genitourinary   Chronic kidney disease, stage III (moderate) (HCC)    Managed by Dr. Cherylann RatelLateef, GFR 48; avoiding NSAIDs; hydrating        Other   Prediabetes   Relevant Orders   Hemoglobin A1c   Vitamin B12 deficiency    Once monthly B12 injections; patient will schedule appt      Familial hyperlipidemia    Check lipids, orders entered, limit saturated fats      Relevant Orders   Lipid panel   Cervical spinal stenosis    Managed by neurologist      Anxiety    Working with Dr. Elna BreslowEappen; she reports doing better; sleeping better; she is safe       Other Visit Diagnoses    Cough    -  Primary   I sent a message to her pulmonologist and he will set her up for an appointment; she is safe-guarding at work       Follow up plan: Return in about 3 months (around 04/07/2019) for follow-up visit with Dr. Sherie DonLada; B12 shots once a month.  An after-visit summary was printed and given to the patient at check-out.  Please see the patient instructions which may contain other information and recommendations beyond what is mentioned above in the assessment and plan.  No orders of the defined types were placed in this  encounter.   Orders Placed This Encounter  Procedures  . Lipid panel  . Hemoglobin A1c

## 2019-01-07 ENCOUNTER — Encounter: Payer: Self-pay | Admitting: Internal Medicine

## 2019-01-07 ENCOUNTER — Other Ambulatory Visit: Payer: Self-pay | Admitting: Family Medicine

## 2019-01-07 ENCOUNTER — Telehealth: Payer: Self-pay | Admitting: Internal Medicine

## 2019-01-07 ENCOUNTER — Ambulatory Visit (INDEPENDENT_AMBULATORY_CARE_PROVIDER_SITE_OTHER): Payer: No Typology Code available for payment source | Admitting: Internal Medicine

## 2019-01-07 ENCOUNTER — Telehealth: Payer: Self-pay | Admitting: Family Medicine

## 2019-01-07 DIAGNOSIS — J309 Allergic rhinitis, unspecified: Secondary | ICD-10-CM

## 2019-01-07 DIAGNOSIS — J45909 Unspecified asthma, uncomplicated: Secondary | ICD-10-CM

## 2019-01-07 NOTE — Telephone Encounter (Signed)
Please make sure patient gets ALL labs that were done They were ordered on different days and I just want to be sure that the draw station wherever she has them drawn will have all lab orders; thank you  Lipid panel, A1c, ALT, CBC with diff, and IgE Thank you!

## 2019-01-07 NOTE — Telephone Encounter (Signed)
Dr. Belia Heman, please review patient's request to let you know some information Thank you

## 2019-01-07 NOTE — Progress Notes (Signed)
IgE and CBC with diff

## 2019-01-07 NOTE — Progress Notes (Addendum)
Name: Emma Rogers MRN: [id number] DOB: [birth year]        VIDEO VISIT    In the setting of the current Covid19 crisis, you are scheduled for a  visit with me on 01/07/2019 Just as we do with many in-office visits, in order for you to participate in this visit, we must obtain consent.   I can obtain your verbal consent now.  PATIENT AGREES AND CONFIRMS -YES This Visit has Audio and Visual Capabilities for optimal patient care experience   Evaluation Performed:  Follow-up visit  This visit type was conducted due to national recommendations for restrictions regarding the COVID-19 Pandemic (e.g. social distancing).  This format is felt to be most appropriate for this patient at this time.  All issues noted in this document were discussed and addressed.       Virtual Visit Via VIDEO  I connected with patient on 01/07/2019 by video and verified that I am speaking with the correct person using two identifiers.   I discussed the limitations, risks, security and privacy concerns of performing an evaluation and management service by telephone and the availability of in person appointments. I also discussed with the patient that there may be a patient responsible charge related to this service. The patient expressed understanding and agreed to proceed.   Location of the patient: Home Location of provider:OFFICE Participating persons: Patient and provider only   CONSULTATION DATE: 01/07/2019  REFERRING MD : [name]  CHIEF COMPLAINT:  Follow up ASTHMA   HISTORY OF PRESENT ILLNESS: Patient with moderate persistent symptoms of wheezing cough and shortness of breath No signs of infection at this time No signs of respiratory distress at this time No signs of asthma exacerbation  Patient however is miserable She is having a hard time at home with her husband with regards to plan of care with 2  dogs Patient is constantly exposed to pet dander Which is causing her symptoms  She has  increased shortness of breath and wheezing throughout the day She is on Symbicort daily and albuterol as needed which is not helping  She is taking Nasonex and Claritin for allergic rhinitis which has helped a little bit  At this time I have advised prednisone therapy however at this time due to her chronic kidney disease stage III she was advised not to take any type of NSAIDs or anti-inflammatory therapy at this time  I have also explained to her several other options which include biological and immunological therapy with NUCALA therapy  She seems to be okay with this however I have asked her to review some literature online regarding this therapy     PAST MEDICAL HISTORY :   has a past medical history of Allergic rhinitis, Anxiety, Asthma, Depression, Headache(784.0), Renal insufficiency, and Shingles.  has a past surgical history that includes Lobe hemithroidectomy; Insert / replace / remove pacemaker; Abdominal hysterectomy (2009); and Breast biopsy (Left, 09/30/2002). Prior to Admission medications   Medication Sig Start Date End Date Taking? Authorizing Provider  Albuterol Sulfate (PROAIR RESPICLICK) 108 (90 Base) MCG/ACT AEPB Inhale 1-2 puffs into the lungs every 4 (four) hours as needed. 07/12/18   Doren Custard, FNP  atorvastatin (LIPITOR) 40 MG tablet Take 1 tablet (40 mg total) by mouth at bedtime. 11/04/18   Kerman Passey, MD  buPROPion (WELLBUTRIN XL) 150 MG 24 hr tablet TAKE 2 TABLETS BY MOUTH DAILY 08/21/18   [name], [name], MD  cyanocobalamin (,VITAMIN B-12,) 1000 MCG/ML injection Inject  1 mL (1,000 mcg total) into the muscle every 30 (thirty) days. 11/03/18   Kerman PasseyLada, Melinda P, MD  fluticasone (FLONASE) 50 MCG/ACT nasal spray Place 2 sprays into both nostrils daily. 12/01/18   Doren CustardBoyce, Emily E, FNP  furosemide (LASIX) 20 MG tablet  02/11/18   [provider]  loratadine (CLARITIN) 10 MG tablet Take 1 tablet (10 mg total) by mouth daily. 12/01/18   Doren CustardBoyce, Emily E, FNP   montelukast (SINGULAIR) 10 MG tablet TAKE 1 TABLET (10 MG TOTAL) BY MOUTH AT BEDTIME. 10/06/17   [name], Janit BernMelinda P, MD  rizatriptan (MAXALT) 10 MG tablet Take 1 tablet (10 mg total) by mouth as needed for migraine. May repeat in 2 hours if needed 08/17/18   Cheryle HorsfallPoulose, Elizabeth E, NP  sertraline (ZOLOFT) 50 MG tablet Take 1 tablet (50 mg total) by mouth daily. 12/06/18   Jomarie LongsEappen, Saramma, MD  topiramate (TOPAMAX) 100 MG tablet Take 1 tablet (100 mg total) by mouth daily. 08/17/18   [name], Percell BeltElizabeth E, NP  traZODone (DESYREL) 100 MG tablet Take 1.5 tablets (150 mg total) by mouth at bedtime as needed. for sleep 11/25/18   Jomarie LongsEappen, Saramma, MD  Vitamin D, Ergocalciferol, (DRISDOL) 1.25 MG (50000 UT) CAPS capsule Take 1 capsule (50,000 Units total) by mouth every 7 (seven) days. 11/03/18   Kerman PasseyLada, Melinda P, MD   Allergies  Allergen Reactions  . Amoxicillin Other (See Comments)  . Clavulanic Acid Other (See Comments)  . Morphine And Related     vomiting     Review of Systems:  Gen:  Denies  fever, sweats, chills weigh loss  HEENT: Denies blurred vision, double vision, ear pain, eye pain, hearing loss, nose bleeds, sore throat Cardiac:  No dizziness, chest pain or heaviness, chest tightness,edema, No JVD Resp:   + No cough, -sputum production, + shortness of breath,+ wheezing, -hemoptysis,  Gi: Denies swallowing difficulty, stomach pain, nausea or vomiting, diarrhea, constipation, bowel incontinence Gu:  Denies bladder incontinence, burning urine Ext:   Denies Joint pain, stiffness or swelling Skin: Denies  skin rash, easy bruising or bleeding or hives Endoc:  Denies polyuria, polydipsia , polyphagia or weight change Psych:   Denies depression, insomnia or hallucinations  Other:  All other systems negative    Physical Examination:  Well nourished, well developed female in no acute distress. No Obvious Respiratory Distress noted EOMI intact, NO obvious oral lesions Facial Skin intact-no facial  rash noted CN 3-12 intact   Insight, judgment intact. -depression -anxiety          IMAGING      ASSESSMENT AND PLAN SYNOPSIS  53 year old pleasant white female seen today for virtual visit on video with chronic persistent moderate asthma that is not controlled in the setting of chronic allergic rhinitis and heavy exposure to pet dander and dogs on a persistent basis At this time is not feasible for her to be away from her dogs due to her family situation and her husband She is also not a candidate for chronic prednisone therapy due to her underlying chronic kidney disease these stage III  At this time I would recommend and suggest obtaining CBC and IgE levels (These will be ordered by PCP Dr. Sherie DonLada) and assess patient for biological therapy with NUCALA    Moderate persistent asthma daily basis Plan for assessment for biological therapy No prednisone at this time Continue Symbicort as prescribed Avoid triggers Albuterol as needed   Allergic rhinitis Start Nasonex and continue Claritin   TOTAL TIME  SPENT 47 mins  COVID-19 Education: The signs and symptoms of COVID-19 were discussed with the patient and how to seek care for testing (follow up with PCP or arrange E-visit).  The importance of social distancing was discussed today.   Patient satisfied with Plan of action and management. All questions answered Follow up in 4 weeks   Brigid Vandekamp Santiago Glad, M.D.  [facility]  Medical Director [facility] Medical Director [facility] Cardio-Pulmonary Department

## 2019-01-07 NOTE — Patient Instructions (Addendum)
AVOID TRIGGERS(DOGS, CATS)  ADVISED TO CONTINUE SYMBICORT AS PRESCRIBED  CONTINUE ALBUTEROL AS NEEDED  CHECK CBC AND IgE LEVELS(TO BE ORDERED BY PCP)   PLAN FOR NUCALA INJECTIONS   Mepolizumab injection What is this medicine? MEPOLIZUMAB (me poe LIZ ue mab) is used in combination with other treatments to help treat severe asthma. It is also used for the treatment of a specific condition that causes inflammation of smaller blood vessels (vasculitis). This medicine may be used for other purposes; ask your health care provider or pharmacist if you have questions. COMMON BRAND NAME(S): Nucala What should I tell my health care provider before I take this medicine? They need to know if you have any of these conditions: -parasitic (helminth) infection -an unusual or allergic reaction to mepolizumab, hamster proteins, other medicines, foods, dyes, or preservatives -pregnant or trying to get pregnant -breast-feeding How should I use this medicine? This medicine is for injection under the skin. It is given by a health care professional in a hospital or clinic setting or at home. If you get this medicine at home, you will be taught how to prepare and give this medicine. Use exactly as directed. Take your medicine at regular intervals. Do not take your medicine more often than directed. It is important that you put your used injectors, needles and syringes in a special sharps container. Do not put them in a trash can. If you do not have a sharps container, call your pharmacist or healthcare provider to get one. Talk to your pediatrician regarding the use of this medicine in children. While this drug may be prescribed for children as young as 6 years for selected conditions, precautions do apply. Overdosage: If you think you have taken too much of this medicine contact a poison control center or emergency room at once. NOTE: This medicine is only for you. Do not share this medicine with others. What  if I miss a dose? It is important not to miss your dose. Call your doctor of health care professional if you are unable to keep an appointment. If you give yourself the medicine and you miss a dose, take it as soon as you can. Then, take your next dose at your regular scheduled time. If it is almost time for your next dose, take only that dose. Do not take double or extra doses. If you are not sure what to do about the missed dose, call your health care professional. What may interact with this medicine? Interactions are not expected. This list may not describe all possible interactions. Give your health care provider a list of all the medicines, herbs, non-prescription drugs, or dietary supplements you use. Also tell them if you smoke, drink alcohol, or use illegal drugs. Some items may interact with your medicine. What should I watch for while using this medicine? Tell your doctor or healthcare professional if your symptoms do not start to get better or if they get worse. Talk with your doctor if you have not had chickenpox or the vaccine for chickenpox. Do not stop taking your other asthma medicines unless instructed to do so by your doctor or health care professional. Your condition will be monitored carefully while you are receiving this medicine. What side effects may I notice from receiving this medicine? Side effects that you should report to your doctor or health care professional as soon as possible: -allergic reactions like skin rash, itching or hives, swelling of the face, lips, or tongue -breathing problems -painful rash or blisters -  signs and symptoms of low blood pressure like dizziness; feeling faint or lightheaded, falls -signs and symptoms of infection like fever or chills; cough; sore throat; pain or trouble passing urine Side effects that usually do not require medical attention (report to your doctor or health care professional if they continue or are bothersome): -back  pain -headache -pain, redness, or irritation at the site where injected -weak or tired This list may not describe all possible side effects. Call your doctor for medical advice about side effects. You may report side effects to FDA at 1-800-FDA-1088. Where should I keep my medicine? Keep out of the reach of children. If you use the vials at home, you will be instructed on how to store this medicine. Throw away any unused medicine after the expiration date on the label. Store unopened autoinjectors and prefilled syringes in the refrigerator between 2 and 8 degrees C (36 and 46 degrees F). Do not freeze. Keep this medicine in the original container. Protect from light. Do not shake. If necessary, unopened autoinjectors and syringes can be stored out of the refrigerator at up to 30 degrees C (86 degrees F) for up to 7 days. Discard if left out of the refrigerator for more than 7 days. This medicine must be administered within 8 hours after removal from the packaging. Discard if not administered within 8 hours. Throw away any unopened, unused medicine after the expiration date on the label. NOTE: This sheet is a summary. It may not cover all possible information. If you have questions about this medicine, talk to your doctor, pharmacist, or health care provider.  2019 Elsevier/Gold Standard (2018-06-04 11:10:36)

## 2019-01-07 NOTE — Telephone Encounter (Addendum)
Nucala enrollment forms has been started.  Pt is aware that her signature is needed. Pt will come by our office next week to sign. Also waiting for recent cbc w/diff to result for eosinophil count.

## 2019-01-10 NOTE — Telephone Encounter (Signed)
nucala paperwork has been signed by pt.  Will await cbc/diff results, as pt plans to have labs drawn on Wednesday.

## 2019-01-10 NOTE — Telephone Encounter (Signed)
Spoke with patient and faxed additional labs to the fax she provided.

## 2019-01-12 ENCOUNTER — Other Ambulatory Visit: Payer: Self-pay

## 2019-01-12 ENCOUNTER — Other Ambulatory Visit: Payer: No Typology Code available for payment source

## 2019-01-12 DIAGNOSIS — N183 Chronic kidney disease, stage 3 unspecified: Secondary | ICD-10-CM

## 2019-01-13 ENCOUNTER — Encounter: Payer: Self-pay | Admitting: Family Medicine

## 2019-01-13 NOTE — Telephone Encounter (Signed)
CBC w/ diff has been ordered. Pt is aware and voiced her understanding.

## 2019-01-13 NOTE — Telephone Encounter (Addendum)
It appears that cbc w/diff ordered by Dr. Sherie Don was not drawn yesterday with other labs.  Dr. Sherie Don would you like for our office to order cbc w/diff?

## 2019-01-13 NOTE — Telephone Encounter (Signed)
Yes, she needs a CBC w/diff please, thank you

## 2019-01-13 NOTE — Telephone Encounter (Signed)
Yes please, thank you.

## 2019-01-13 NOTE — Telephone Encounter (Signed)
Dr. Belia Heman, cbc w/diff is needed for nucala start. It was ordered by Dr. Sherie Don, but was not drawn.  Is it okay if we order?

## 2019-01-14 ENCOUNTER — Other Ambulatory Visit: Payer: Self-pay

## 2019-01-14 ENCOUNTER — Other Ambulatory Visit
Admission: RE | Admit: 2019-01-14 | Discharge: 2019-01-14 | Disposition: A | Discharge status: Home / Self Care | Payer: No Typology Code available for payment source | Source: Ambulatory Visit | Attending: Internal Medicine | Admitting: Internal Medicine

## 2019-01-14 DIAGNOSIS — J45909 Unspecified asthma, uncomplicated: Secondary | ICD-10-CM | POA: Diagnosis present

## 2019-01-14 LAB — CBC WITH DIFFERENTIAL/PLATELET
Abs Immature Granulocytes: 0.01 10*3/uL (ref 0.00–0.07)
Basophils Absolute: 0.1 10*3/uL (ref 0.0–0.1)
Basophils Relative: 1 %
Eosinophils Absolute: 0.2 10*3/uL (ref 0.0–0.5)
Eosinophils Relative: 2 %
HCT: 41.1 % (ref 36.0–46.0)
Hemoglobin: 13.9 g/dL (ref 12.0–15.0)
Immature Granulocytes: 0 %
Lymphocytes Relative: 30 %
Lymphs Abs: 1.9 10*3/uL (ref 0.7–4.0)
MCH: 31.3 pg (ref 26.0–34.0)
MCHC: 33.8 g/dL (ref 30.0–36.0)
MCV: 92.6 fL (ref 80.0–100.0)
Monocytes Absolute: 0.3 10*3/uL (ref 0.1–1.0)
Monocytes Relative: 5 %
Neutro Abs: 3.8 10*3/uL (ref 1.7–7.7)
Neutrophils Relative %: 62 %
Platelets: 264 10*3/uL (ref 150–400)
RBC: 4.44 MIL/uL (ref 3.87–5.11)
RDW: 12.6 % (ref 11.5–15.5)
WBC: 6.2 10*3/uL (ref 4.0–10.5)
nRBC: 0 % (ref 0.0–0.2)

## 2019-01-14 LAB — HEMOGLOBIN A1C
Est. average glucose Bld gHb Est-mCnc: 117 mg/dL
Hgb A1c MFr Bld: 5.7 % — ABNORMAL HIGH (ref 4.8–5.6)

## 2019-01-14 LAB — LIPID PANEL
Chol/HDL Ratio: 3 ratio (ref 0.0–4.4)
Cholesterol, Total: 172 mg/dL (ref 100–199)
HDL: 57 mg/dL (ref >39)
LDL Calculated: 91 mg/dL (ref 0–99)
Triglycerides: 119 mg/dL (ref 0–149)
VLDL Cholesterol Cal: 24 mg/dL (ref 5–40)

## 2019-01-14 LAB — IGE: IgE (Immunoglobulin E), Serum: 3 IU/mL — ABNORMAL LOW (ref 6–495)

## 2019-01-14 LAB — ALT: ALT: 16 IU/L (ref 0–32)

## 2019-01-19 ENCOUNTER — Telehealth: Payer: Self-pay | Admitting: Internal Medicine

## 2019-01-19 NOTE — Telephone Encounter (Signed)
Application for Nucala has been started and faxed to [facility] office.

## 2019-01-19 NOTE — Telephone Encounter (Signed)
Received a fax from Lajoyce Lauber, CMA in our [place] office. Dr. Belia Heman wants to start the enrollment process for Nucala. All enrollment forms were filled out and faxed to me. These forms and the pt's insurance cards have been faxed to [facility] [place].

## 2019-01-19 NOTE — Telephone Encounter (Signed)
Please proceed with application and assess whether or not she qualifies

## 2019-01-19 NOTE — Telephone Encounter (Signed)
Emma Rogers from  [employer] to [place] states has questions about form sent to them.  Forms for the Nucala injection.  Emma Rogers phone number is [phone].

## 2019-01-19 NOTE — Telephone Encounter (Signed)
Cbc has been completed, however eosinophil count was not elevated.   DK would you like to still proceed with Nucala?

## 2019-01-20 NOTE — Telephone Encounter (Signed)
Received a fax from [employer] to [place]. Fax states that p's benefits are undisclosed at this time. Our office will have to contact the pt's insurance directly to get the pt's summary of benefits.  Called [facility] [employer] at [phone]. Spoke with Fannie Knee. According to the pt's insurance plan, she is responsible for 20%. PA was started with Fannie Knee for [place]. Supporting clinical notes and labs have been faxed to [phone].  Will continue to follow up.

## 2019-01-20 NOTE — Telephone Encounter (Signed)
Called [facility] [place] at [phone]. Spoke with Vernona Rieger. States that the form that was faxed to them yesterday was marked for the prefilled syringe. For the medication to be administered here at the office, the form needs to be filled out for the single dose vial. Vernona Rieger has updated this in their system to reflect the single dose vial and not the prefilled syringe. Will await summary of benefits.

## 2019-01-25 ENCOUNTER — Telehealth: Payer: Self-pay | Admitting: Internal Medicine

## 2019-01-25 ENCOUNTER — Other Ambulatory Visit: Payer: Self-pay

## 2019-01-25 ENCOUNTER — Ambulatory Visit (INDEPENDENT_AMBULATORY_CARE_PROVIDER_SITE_OTHER): Payer: No Typology Code available for payment source | Admitting: Psychiatry

## 2019-01-25 DIAGNOSIS — F411 Generalized anxiety disorder: Secondary | ICD-10-CM

## 2019-01-25 DIAGNOSIS — J45909 Unspecified asthma, uncomplicated: Secondary | ICD-10-CM

## 2019-01-25 DIAGNOSIS — G4701 Insomnia due to medical condition: Secondary | ICD-10-CM | POA: Diagnosis not present

## 2019-01-25 DIAGNOSIS — F330 Major depressive disorder, recurrent, mild: Secondary | ICD-10-CM | POA: Diagnosis not present

## 2019-01-25 MED ORDER — SERTRALINE HCL 50 MG PO TABS: 75.0000 mg | ORAL_TABLET | Freq: Every day | ORAL | 1 refills | Status: DC

## 2019-01-25 MED ORDER — TRAZODONE HCL 100 MG PO TABS: 150.0000 mg | ORAL_TABLET | Freq: Every evening | ORAL | 1 refills | Status: DC | PRN

## 2019-01-25 NOTE — Telephone Encounter (Signed)
Pt is aware of below message and voiced her understanding. Lab for ova and parasite has been ordered. Nothing further is needed.
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
## 2019-01-25 NOTE — Telephone Encounter (Signed)
I discussed with Dr Johnathan Hausen, they need ova and parasite testing in the stool before they order and approve the Nucala.

## 2019-01-25 NOTE — Telephone Encounter (Signed)
DK please see below message and advise. Thanks 

## 2019-01-26 ENCOUNTER — Ambulatory Visit (INDEPENDENT_AMBULATORY_CARE_PROVIDER_SITE_OTHER): Payer: No Typology Code available for payment source | Admitting: Licensed Clinical Social Worker

## 2019-01-26 ENCOUNTER — Encounter: Payer: Self-pay | Admitting: Psychiatry

## 2019-01-26 ENCOUNTER — Encounter: Payer: Self-pay | Admitting: Licensed Clinical Social Worker

## 2019-01-26 DIAGNOSIS — F330 Major depressive disorder, recurrent, mild: Secondary | ICD-10-CM

## 2019-01-26 DIAGNOSIS — F411 Generalized anxiety disorder: Secondary | ICD-10-CM | POA: Diagnosis not present

## 2019-01-26 NOTE — Telephone Encounter (Signed)
Spoke with Tammy with [employer]. She is a Financial risk analyst and she states that due to the information we sent, she could not approve the pt's medication. She has sent this to their group of physicians to see if they could possibly approve it. Tammy states that we should hear something by the end of business day tomorrow. Will continue to follow up.
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
## 2019-01-26 NOTE — Telephone Encounter (Signed)
Called [facility] to follow up on this PA. Spoke with Helmut Muster, she states that this PA is still pending at this time. She transferred to the nurse who is responsible for this PA. I have left a message for Tammy.

## 2019-01-27 ENCOUNTER — Encounter: Payer: Self-pay | Admitting: Nurse Practitioner

## 2019-01-27 ENCOUNTER — Ambulatory Visit (INDEPENDENT_AMBULATORY_CARE_PROVIDER_SITE_OTHER): Payer: No Typology Code available for payment source | Admitting: Nurse Practitioner

## 2019-01-27 ENCOUNTER — Other Ambulatory Visit: Payer: Self-pay

## 2019-01-27 VITALS — Temp 98.4°F | Ht 66.0 in | Wt 179.0 lb

## 2019-01-27 DIAGNOSIS — F329 Major depressive disorder, single episode, unspecified: Secondary | ICD-10-CM | POA: Diagnosis not present

## 2019-01-27 DIAGNOSIS — R413 Other amnesia: Secondary | ICD-10-CM | POA: Diagnosis not present

## 2019-01-27 DIAGNOSIS — F419 Anxiety disorder, unspecified: Secondary | ICD-10-CM

## 2019-01-27 DIAGNOSIS — J4540 Moderate persistent asthma, uncomplicated: Secondary | ICD-10-CM | POA: Diagnosis not present

## 2019-01-27 DIAGNOSIS — F32A Depression, unspecified: Secondary | ICD-10-CM

## 2019-01-27 NOTE — Progress Notes (Signed)
Virtual Visit via Video Note  I connected with Emma Rogers on 01/27/19 at  9:00 AM EDT by a video enabled telemedicine application and verified that I am speaking with the correct person using two identifiers.   Staff discussed the limitations of evaluation and management by telemedicine and the availability of in person appointments. The patient expressed understanding and agreed to proceed.  Patient location: car My location: home  office Other people present:  none HPI Asthma  States Is seeing Dr. Belia Heman for asthma- getting testing for parasites due to low igE tested for starting her on Nucala. States her asthma is not well-controlled right now. Patient endorses chronic difficulty with deep inspiration- states her chest xray  Shows some atelectasis. Is out of breath more quickly than her baseline.   Cognitive changes States is forgetting to do tasks she would normally remember for work. States she has noticed that she has to write everything down to remember it. States feels she is having trouble finding the right words to say what she wants to say and getting very irritated and easily overwhelmed. Patient states this has been going on for about 6 months and has been progressively getting worse.   6CIT Screen 01/27/2019  What Year? 0 points  What month? 0 points  What time? 0 points  Count back from 20 0 points  Months in reverse 0 points  Repeat phrase 4 points  Total Score 4    Anxiety and Depression  Is feeling better on the zoloft, she is in therapy and doing much improved and is able to talk through a lot of things. Her husband is also going to counseling and that is good. States has some anxiety but much better controlled  PHQ2/9: Depression screen [facility] 2/9 01/27/2019 01/06/2019 12/01/2018 11/22/2018 11/18/2018  Decreased Interest 2 2 0 0 1  Down, Depressed, Hopeless 0 1  PHQ - 2 Score 0 2  Altered sleeping 2 0 0 0 -  Tired, decreased energy 0 2  Change in appetite  0 0 0 0 2  Feeling bad or failure about yourself  - 3 0 0 1  Trouble concentrating 0 2  Moving slowly or fidgety/restless 0 2 0 0 1  Suicidal thoughts 0 0 0 0 0  PHQ-9 Score 0 -  Difficult doing work/chores Very difficult Somewhat difficult Not difficult at all Not difficult at all Somewhat difficult  Some recent data might be hidden    PHQ reviewed. Positive   Patient Active Problem List   Diagnosis Date Noted  . Left leg weakness 12/30/2018  . Urinary urgency 12/30/2018  . Cervical spinal stenosis 12/30/2018  . Benign essential tremor 12/30/2018  . Chronic venous insufficiency 03/10/2018  . Lymphedema 03/10/2018  . Elevated blood pressure reading 10/13/2017  . Anxiety 10/13/2017  . Familial hyperlipidemia 10/10/2017  . Bilateral leg edema 09/03/2017  . Depression 09/03/2017  . Chronic kidney disease, stage III (moderate) (HCC) 09/03/2017  . Vitamin D deficiency 09/03/2017  . Vitamin B12 deficiency 09/03/2017  . Medication monitoring encounter 09/03/2017  . Insomnia 08/11/2017  . Menopausal symptoms 08/11/2017  . Shortness of breath 05/18/2017  . Chest pain 03/31/2017  . Migraine without aura and without status migrainosus, not intractable 02/21/2017  . Prediabetes 02/20/2017  . Fatigue 02/20/2017  . 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 VASOVAGAL SYNCOPE 11/27/2009  . Allergic rhinitis 01/18/2008  . HEADACHE 01/18/2008    Past  Medical History:  Diagnosis Date  . Allergic rhinitis   . Anxiety   . Asthma   . Depression   . Headache(784.0)   . Renal insufficiency    Stage 3 kidney disease.   . Shingles     Past Surgical History:  Procedure Laterality Date  . ABDOMINAL HYSTERECTOMY  2009   still has 1 ovary  . BREAST BIOPSY Left 09/30/2002   Ductogram-neg  . INSERT / REPLACE / REMOVE PACEMAKER     Permanent pacemaker, Dr. Graciela HusbandsKlein  . Lobe hemithroidectomy     Right    Social History   Tobacco Use  . Smoking status: Never Smoker  . Smokeless  tobacco: Never Used  Substance Use Topics  . Alcohol use: No    Alcohol/week: 0.0 standard drinks     Current Outpatient Medications:  .  atorvastatin (LIPITOR) 40 MG tablet, Take 1 tablet (40 mg total) by mouth at bedtime., Disp: 30 tablet, Rfl: 1 .  budesonide-formoterol (SYMBICORT) 80-4.5 MCG/ACT inhaler, Inhale 2 puffs into the lungs 2 (two) times daily., Disp: 1 Inhaler, Rfl: 12 .  buPROPion (WELLBUTRIN XL) 150 MG 24 hr tablet, TAKE 2 TABLETS BY MOUTH DAILY, Disp: 60 tablet, Rfl: 2 .  cyanocobalamin (,VITAMIN B-12,) 1000 MCG/ML injection, Inject 1 mL (1,000 mcg total) into the muscle every 30 (thirty) days., Disp: , Rfl:  .  furosemide (LASIX) 20 MG tablet, , Disp: , Rfl: 3 .  loratadine (CLARITIN) 10 MG tablet, Take 1 tablet (10 mg total) by mouth daily., Disp: 90 tablet, Rfl: 1 .  mometasone (NASONEX) 50 MCG/ACT nasal spray, Place 2 sprays into the nose daily., Disp: 17 g, Rfl: 2 .  rizatriptan (MAXALT) 10 MG tablet, Take 1 tablet (10 mg total) by mouth as needed for migraine. May repeat in 2 hours if needed, Disp: 10 tablet, Rfl: 1 .  sertraline (ZOLOFT) 50 MG tablet, Take 1.5 tablets (75 mg total) by mouth daily., Disp: 45 tablet, Rfl: 1 .  topiramate (TOPAMAX) 100 MG tablet, Take 1 tablet (100 mg total) by mouth daily., Disp: 30 tablet, Rfl: 5 .  traZODone (DESYREL) 100 MG tablet, Take 1.5 tablets (150 mg total) by mouth at bedtime as needed. for sleep, Disp: 45 tablet, Rfl: 1 .  VITAMIN D PO, Take 1 tablet by mouth daily., Disp: , Rfl:  .  Albuterol Sulfate (PROAIR RESPICLICK) 108 (90 Base) MCG/ACT AEPB, Inhale 1-2 puffs into the lungs every 4 (four) hours as needed. (Patient not taking: Reported on 01/27/2019), Disp: 1 each, Rfl: 2  Allergies  Allergen Reactions  . Amoxicillin Other (See Comments)  . Clavulanic Acid Other (See Comments)  . Morphine And Related     vomiting    ROS  No other specific complaints in a complete review of systems (except as listed in HPI  above).  Objective  Vitals:   01/27/19 0901  Temp: 98.4 F (36.9 C)  TempSrc: Oral  Weight: 179 lb (81.2 kg)  Height: 5\' 6"  (1.676 m)    Body mass index is 28.89 kg/m.  Nursing Note and Vital Signs reviewed.  Physical Exam  Constitutional: Patient appears well-developed and well-nourished. No distress.  HENT: Head: Normocephalic and atraumatic. Pulmonary/Chest: Effort normal  Musculoskeletal: Normal range of motion,  Neurological:  alert and oriented. speech  normal.   Psychiatric: Patient has a normal mood and affect. behavior is normal. Judgment and thought content normal.    Assessment & Plan  1. Memory impairment Appears to be slowly progressing despite  anxiety and stress decreasing.will refer to neurology.  - Ambulatory referral to Neurology  2. Anxiety Follow up with Dr. Elna Breslow, appears to be improving   3. Depression, unspecified depression type Follow up with Dr. Elna Breslow, appears to be improving   4. Moderate persistent asthma without complication Follow up with Dr. Belia Heman  Follow Up Instructions:   3 months routine I discussed the assessment and treatment plan with the patient. The patient was provided an opportunity to ask questions and all were answered. The patient agreed with the plan and demonstrated an understanding of the instructions.   The patient was advised to call back or seek an in-person evaluation if the symptoms worsen or if the condition fails to improve as anticipated.  I provided 22 minutes of non-face-to-face time during this encounter.   [name], NP

## 2019-01-28 ENCOUNTER — Other Ambulatory Visit
Admission: RE | Admit: 2019-01-28 | Discharge: 2019-01-28 | Disposition: A | Discharge status: Home / Self Care | Payer: No Typology Code available for payment source | Source: Ambulatory Visit | Attending: Internal Medicine | Admitting: Internal Medicine

## 2019-01-28 DIAGNOSIS — Z01812 Encounter for preprocedural laboratory examination: Secondary | ICD-10-CM | POA: Insufficient documentation

## 2019-01-28 DIAGNOSIS — Z110 Encounter for screening for intestinal infectious diseases: Secondary | ICD-10-CM | POA: Diagnosis not present

## 2019-01-28 DIAGNOSIS — Z09 Encounter for follow-up examination after completed treatment for conditions other than malignant neoplasm: Secondary | ICD-10-CM | POA: Diagnosis present

## 2019-01-28 LAB — GASTROINTESTINAL PANEL BY PCR, STOOL (REPLACES STOOL CULTURE)

## 2019-01-31 NOTE — Telephone Encounter (Signed)
I still have not received anything on the pt's Nucala PA. Spoke with [employer]. Called [facility] Plan to follow up. PA has been denied as of 01/27/2019. Jed Limerick is going to fax me a copy of the denial letter. Will await fax.

## 2019-02-01 NOTE — Telephone Encounter (Signed)
I have had a conversation with physician PEER to PEER review already  Patient needs to avoid her pets at home No other recs at this time

## 2019-02-01 NOTE — Telephone Encounter (Signed)
LMTCB x1 for pt.  

## 2019-02-01 NOTE — Telephone Encounter (Signed)
Spoke with pt. She is aware of Dr. Clovis Fredrickson response. Pt is very worried about her health and wants to see Dr. Belia Heman and discuss some things. She has been scheduled for a televisit with Dr. Belia Heman on 02/02/2019 at 11:30am. This is the only time that worked for the pt's schedule. Nothing further was needed at this time.

## 2019-02-01 NOTE — Telephone Encounter (Signed)
Received pt's PA denial letter. Letter states that Nucala has been determined to be not medically necessary as the widely accepted standard per Physician Advisor Independent review and per Peer to Peer review.  Dr. Belia Heman - please advise. Thanks.

## 2019-02-02 ENCOUNTER — Encounter: Payer: Self-pay | Admitting: Internal Medicine

## 2019-02-02 ENCOUNTER — Ambulatory Visit (INDEPENDENT_AMBULATORY_CARE_PROVIDER_SITE_OTHER): Payer: No Typology Code available for payment source | Admitting: Internal Medicine

## 2019-02-02 DIAGNOSIS — R0602 Shortness of breath: Secondary | ICD-10-CM | POA: Diagnosis not present

## 2019-02-02 DIAGNOSIS — G4719 Other hypersomnia: Secondary | ICD-10-CM | POA: Diagnosis not present

## 2019-02-02 NOTE — Patient Instructions (Signed)
AVOID DOGS AT ALL COSTS CONTINUE INHALERS AS PRESCRIBED  OBTAIN PFT's OBTAIN CT CHEST without CONTRAST OBTAIN SLEEP STUDY TO ASSESS FOR OSA

## 2019-02-02 NOTE — Progress Notes (Signed)
Name: Emma Rogers MRN: [id number] DOB: 1966/05/15        VIDEO VISIT    In the setting of the current Covid19 crisis, you are scheduled for a  visit with me on 02/02/2019 Just as we do with many in-office visits, in order for you to participate in this visit, we must obtain consent.   I can obtain your verbal consent now.  PATIENT AGREES AND CONFIRMS -YES This Visit has Audio and Visual Capabilities for optimal patient care experience   Evaluation Performed:  Follow-up visit  This visit type was conducted due to national recommendations for restrictions regarding the COVID-19 Pandemic (e.g. social distancing).  This format is felt to be most appropriate for this patient at this time.  All issues noted in this document were discussed and addressed.       Virtual Visit Via VIDEO  I connected with patient on 02/02/2019 by video and verified that I am speaking with the correct person using two identifiers.   I discussed the limitations, risks, security and privacy concerns of performing an evaluation and management service by telephone and the availability of in person appointments. I also discussed with the patient that there may be a patient responsible charge related to this service. The patient expressed understanding and agreed to proceed.   Location of the patient: Home Location of provider:OFFICE Participating persons: Patient and provider only   CONSULTATION DATE: 02/02/2019  REFERRING MD : Emma Rogers  CHIEF COMPLAINT:  Follow up ASTHMA   HISTORY OF PRESENT ILLNESS: Follow up ASTHMA Moderate persistent symptoms of wheezing and cough and SOB Left flank pain  Does NOT look in distress  Patient still exposed to DOGS She has had significant second hand smoke exposure for past 20 years Continue Inhalers as prescribed  Patient in constant exposure to per dander and 2 dogs This is main cause of exposure   Continues to take Nasonex and Claritin for allergic rhinitis    Patient has been denied NUCALA therapy due to Normal IgE levels and EOS levels    Patient is seen today for problems and issues with sleep related to excessive daytime sleepiness Patient  has been having sleep problems for many years Patient has been having excessive daytime sleepiness for a long time Patient has been having extreme fatigue and tiredness, lack of energy + very Loud snoring every night + struggling breathe at night and gasps for air Has non-refreshed sleep  Discussed sleep data and reviewed with patient.  Encouraged proper weight management.  Discussed driving precautions and its relationship with hypersomnolence.  Discussed operating dangerous equipment and its relationship with hypersomnolence.  Discussed sleep hygiene, and benefits of a fixed sleep waked time.  The importance of getting eight or more hours of sleep discussed with patient.  Discussed limiting the use of the computer and television before bedtime.  Decrease naps during the day, so night time sleep will become enhanced.  Limit caffeine, and sleep deprivation.  HTN, stroke, and heart failure are potential risk factors.    EPWORTH SLEEP SCORE 4    PAST MEDICAL HISTORY :   has a past medical history of Allergic rhinitis, Anxiety, Asthma, Depression, Headache(784.0), Renal insufficiency, and Shingles.  has a past surgical history that includes Lobe hemithroidectomy; Insert / replace / remove pacemaker; Abdominal hysterectomy (2009); and Breast biopsy (Left, 09/30/2002). Prior to Admission medications   Medication Sig Start Date End Date Taking? Authorizing Provider  Albuterol Sulfate (PROAIR RESPICLICK) 108 (90 Base) MCG/ACT AEPB  Inhale 1-2 puffs into the lungs every 4 (four) hours as needed. 07/12/18   Doren Custard, FNP  atorvastatin (LIPITOR) 40 MG tablet Take 1 tablet (40 mg total) by mouth at bedtime. 11/04/18   Kerman Passey, MD  buPROPion (WELLBUTRIN XL) 150 MG 24 hr tablet TAKE 2 TABLETS BY  MOUTH DAILY 08/21/18   Emma Rogers, Emma Bern, MD  cyanocobalamin (,VITAMIN B-12,) 1000 MCG/ML injection Inject 1 mL (1,000 mcg total) into the muscle every 30 (thirty) days. 11/03/18   Kerman Passey, MD  fluticasone (FLONASE) 50 MCG/ACT nasal spray Place 2 sprays into both nostrils daily. 12/01/18   Doren Custard, FNP  furosemide (LASIX) 20 MG tablet  02/11/18   [provider]  loratadine (CLARITIN) 10 MG tablet Take 1 tablet (10 mg total) by mouth daily. 12/01/18   Doren Custard, FNP  montelukast (SINGULAIR) 10 MG tablet TAKE 1 TABLET (10 MG TOTAL) BY MOUTH AT BEDTIME. 10/06/17   Emma Rogers, Emma Bern, MD  rizatriptan (MAXALT) 10 MG tablet Take 1 tablet (10 mg total) by mouth as needed for migraine. May repeat in 2 hours if needed 08/17/18   [name], NP  sertraline (ZOLOFT) 50 MG tablet Take 1 tablet (50 mg total) by mouth daily. 12/06/18   [name], MD  topiramate (TOPAMAX) 100 MG tablet Take 1 tablet (100 mg total) by mouth daily. 08/17/18   [name], [name], NP  traZODone (DESYREL) 100 MG tablet Take 1.5 tablets (150 mg total) by mouth at bedtime as needed. for sleep 11/25/18   [name], MD  Vitamin D, Ergocalciferol, (DRISDOL) 1.25 MG (50000 UT) CAPS capsule Take 1 capsule (50,000 Units total) by mouth every 7 (seven) days. 11/03/18   Kerman Passey, MD   Allergies  Allergen Reactions  . Amoxicillin Other (See Comments)  . Clavulanic Acid Other (See Comments)  . Morphine And Related     vomiting     Review of Systems:  Gen:  Denies  fever, sweats, chills weigh loss  HEENT: Denies blurred vision, double vision, ear pain, eye pain, hearing loss, nose bleeds, sore throat Cardiac:  No dizziness, chest pain or heaviness, chest tightness,edema, No JVD Resp:   + cough, -sputum production, +shortness of breath,+wheezing, -hemoptysis,  Gi: Denies swallowing difficulty, stomach pain, nausea or vomiting, diarrhea, constipation, bowel incontinence Gu:  Denies bladder  incontinence, burning urine Ext:   Denies Joint pain, stiffness or swelling Skin: Denies  skin rash, easy bruising or bleeding or hives Endoc:  Denies polyuria, polydipsia , polyphagia or weight change Psych:   Denies depression, insomnia or hallucinations  Other:  All other systems negative    Physical Examination:  Well nourished, well developed female in no acute distress. No Obvious Respiratory Distress noted EOMI intact, NO obvious oral lesions Facial Skin intact-no facial rash noted CN 3-12 intact   Insight, judgment intact. -depression -anxiety    IMAGING      ASSESSMENT AND PLAN SYNOPSIS  53 year old pleasant white female seen today for virtual visit on video with chronic persistent moderate asthma that is not controlled in the setting of chronic allergic rhinitis and heavy exposure to pet dander and dogs on a persistent basis  Patient has persistent SOB and Left flank pain Obtain CT chest to assess lungs Patient may have underlying COPD as well-will need to obtain PFT's   Moderate persistent asthma daily basis She has chronic exposure to dogs No prednisone at this time Continue Symbicort as prescribed Avoid triggers  Albuterol as needed At this time  CBC and IgE levels are WNL and patient is NOT a candidate for NUCALA therapy as insurance company has denied claim   Signs and symptoms of sleep apnea Patient will need sleep study  Allergic rhinitis Continue Nasonex and continue Claritin   Total Time Spent 43 mins  COVID-19 EDUCATION: The signs and symptoms of COVID-19 were discussed with the patient and how to seek care for testing (follow up with PCP or arrange E-visit).  The importance of social distancing was discussed today.  MEDICATION ADJUSTMENTS/LABS AND TESTS ORDERED: Obtain PFT's CT chest without contrast for SOB and left flank pain Obtain Sleep Study  CURRENT MEDICATIONS REVIEWED AT LENGTH WITH PATIENT TODAY   Patient satisfied with Plan  of action and management. All questions answered  Follow up 3 months  Nedra Mcinnis Santiago Glad, M.D.  [facility]  Medical Director [facility] Medical Director [facility] Cardio-Pulmonary Department

## 2019-02-04 LAB — CBC WITH DIFFERENTIAL/PLATELET
Basophils Absolute: 0.1 10*3/uL (ref 0.0–0.2)
Basos: 1 %
EOS (ABSOLUTE): 0.1 10*3/uL (ref 0.0–0.4)
Eos: 2 %
Hematocrit: 39.9 % (ref 34.0–46.6)
Hemoglobin: 13 g/dL (ref 11.1–15.9)
Immature Grans (Abs): 0 10*3/uL (ref 0.0–0.1)
Immature Granulocytes: 0 %
Lymphocytes Absolute: 1.7 10*3/uL (ref 0.7–3.1)
Lymphs: 26 %
MCH: 31 pg (ref 26.6–33.0)
MCHC: 32.6 g/dL (ref 31.5–35.7)
MCV: 95 fL (ref 79–97)
Monocytes Absolute: 0.3 10*3/uL (ref 0.1–0.9)
Monocytes: 5 %
Neutrophils Absolute: 4.3 10*3/uL (ref 1.4–7.0)
Neutrophils: 66 %
Platelets: 256 10*3/uL (ref 150–450)
RBC: 4.19 x10E6/uL (ref 3.77–5.28)
RDW: 13 % (ref 11.7–15.4)
WBC: 6.6 10*3/uL (ref 3.4–10.8)

## 2019-02-04 LAB — SPECIMEN STATUS REPORT

## 2019-02-07 ENCOUNTER — Ambulatory Visit
Admission: RE | Admit: 2019-02-07 | Discharge: 2019-02-07 | Disposition: A | Discharge status: Home / Self Care | Payer: No Typology Code available for payment source | Source: Ambulatory Visit | Attending: Internal Medicine | Admitting: Internal Medicine

## 2019-02-07 ENCOUNTER — Other Ambulatory Visit: Payer: Self-pay

## 2019-02-07 DIAGNOSIS — R0602 Shortness of breath: Secondary | ICD-10-CM | POA: Insufficient documentation

## 2019-02-07 NOTE — Telephone Encounter (Signed)
Both CT's are pending with [facility]. I faxed clinical notes.

## 2019-02-08 NOTE — Progress Notes (Signed)
Bibasilar bandlike scarring and partial atelectasis. There are small paramedian consolidations of the medial segment right middle lobe and lingula. Findings are generally consistent with sequelae of prior atypical infection and not significantly changed compared to CT dated 03/31/2017. There is no significant nodularity or other finding to suggest ongoing infection.

## 2019-02-09 NOTE — Telephone Encounter (Signed)
I called [facility] to check the status and it is still pending.

## 2019-02-11 ENCOUNTER — Ambulatory Visit (INDEPENDENT_AMBULATORY_CARE_PROVIDER_SITE_OTHER): Payer: No Typology Code available for payment source | Admitting: Licensed Clinical Social Worker

## 2019-02-11 ENCOUNTER — Other Ambulatory Visit: Payer: Self-pay

## 2019-02-11 ENCOUNTER — Encounter: Payer: Self-pay | Admitting: Licensed Clinical Social Worker

## 2019-02-11 DIAGNOSIS — Z634 Disappearance and death of family member: Secondary | ICD-10-CM

## 2019-02-11 DIAGNOSIS — F411 Generalized anxiety disorder: Secondary | ICD-10-CM | POA: Diagnosis not present

## 2019-02-11 DIAGNOSIS — F330 Major depressive disorder, recurrent, mild: Secondary | ICD-10-CM | POA: Diagnosis not present

## 2019-02-11 NOTE — Progress Notes (Signed)
Virtual Visit via Video Note  I connected with Emma Rogers on 02/11/19 at  8:00 AM EDT by a video enabled telemedicine application and verified that I am speaking with the correct person using two identifiers.   I discussed the limitations of evaluation and management by telemedicine and the availability of in person appointments. The patient expressed understanding and agreed to proceed.  I discussed the assessment and treatment plan with the patient. The patient was provided an opportunity to ask questions and all were answered. The patient agreed with the plan and demonstrated an understanding of the instructions.   The patient was advised to call back or seek an in-person evaluation if the symptoms worsen or if the condition fails to improve as anticipated.  I provided 60 minutes of non-face-to-face time during this encounter.   Heidi Dach, LCSW    THERAPIST PROGRESS NOTE  Session Time: 0800  Participation Level: Active  Behavioral Response: NeatAlertDepressed  Type of Therapy: Individual Therapy  Treatment Goals addressed: Coping  Interventions: Supportive  Summary: Emma Rogers is a 53 y.o. female who presents with symptoms related to her diagnosis. Emma Rogers reports doing well since our last session. She reports, with father's day approaching, she has felt more sadness as it relates to the passing of her father. LCSW normalized those feelings and encouraged Emma Rogers to allow herself to feel sad and do something to honor her father. [name] expressed understanding and agreement with this idea. Emma Rogers went on to discuss her marriage. She reported her husband continues to make her feel everything going wrong in their marriage is her fault, he will tell her she is difficult and needs to have less opinions. Emma Rogers explained several examples of the arguments she and her husband have had recently. "I tried to talk to him about sanatizing his hands. But, he doesn't believe in this  pandemic and thinks the government did it. He won't even hear me out." Another example Emma Rogers provided was her husband wanting to eat dinner together every evening, even if he is working in the yard until 9PM, she is expected to wait to have dinner with him. Further, he often tells her it is the woman's role to be submissive to the husband. LCSW asked Emma Rogers how she felt about these events. She expressed wanting to be able to have a conversation without it leading to an argument. She added she would like to be able to speak her mind without her husband telling her she shouldn't. And, finally, she reported not feeling it is appropriate for women to be submissive. LCSW validated her feelings and encouraged Emma Rogers to begin internalizing everything she just told LCSW. Emma Rogers stated she feels she has lost herself within her marriage. LCSW took time to hold space to discuss what that meant to [facility], and how she could find herself again. LCSW explained she thought it was important to determine who Emma Rogers is before attempting to solve problems in the marriage. Emma Rogers expressed understanding and agreement. LCSW asked Emma Rogers to spend time before our next appointment writing out who she is, what she believes in, what is important to her, etc. Emma Rogers expressed understanding and agreement with this idea.   Suicidal/Homicidal: No  Therapist Response: Emma Rogers continues to work towards her tx goals but has not yet reached them. We will continue to work on building self-esteem and assertiveness skills moving forward.   Plan: Return again in 2 weeks.  Diagnosis: Axis I: MDD    Axis II: No diagnosis  Heidi DachKelsey Arlenne Kimbley, LCSW 02/11/2019

## 2019-02-15 NOTE — Telephone Encounter (Signed)
[facility]: [phone] (exp. 02/25/19 to 03/27/19) patient is scheduled at GI for 02/25/19.

## 2019-02-15 NOTE — Telephone Encounter (Signed)
I called [facility] to check the status they informed me it is still pending.

## 2019-02-22 ENCOUNTER — Ambulatory Visit (INDEPENDENT_AMBULATORY_CARE_PROVIDER_SITE_OTHER): Payer: No Typology Code available for payment source | Admitting: *Deleted

## 2019-02-22 DIAGNOSIS — Z950 Presence of cardiac pacemaker: Secondary | ICD-10-CM

## 2019-02-22 DIAGNOSIS — R002 Palpitations: Secondary | ICD-10-CM

## 2019-02-22 DIAGNOSIS — R55 Syncope and collapse: Secondary | ICD-10-CM | POA: Diagnosis not present

## 2019-02-23 LAB — CUP PACEART REMOTE DEVICE CHECK
Battery Impedance: 490 Ohm
Battery Remaining Longevity: 77 mo
Battery Voltage: 2.78 V
Brady Statistic AP VP Percent: 1 %
Brady Statistic AP VS Percent: 45 %
Brady Statistic AS VP Percent: 0 %
Brady Statistic AS VS Percent: 54 %
Date Time Interrogation Session: 20200602115000
Implantable Lead Implant Date: 19960806
Implantable Lead Implant Date: 19960806
Implantable Lead Location: 753859
Implantable Lead Location: 753860
Implantable Lead Model: 4524
Implantable Lead Model: 5034
Implantable Pulse Generator Implant Date: 20110328
Lead Channel Impedance Value: 1045 Ohm
Lead Channel Impedance Value: 233 Ohm
Lead Channel Pacing Threshold Amplitude: 1.25 V
Lead Channel Pacing Threshold Amplitude: 1.375 V
Lead Channel Pacing Threshold Pulse Width: 0.4 ms
Lead Channel Pacing Threshold Pulse Width: 0.4 ms
Lead Channel Setting Pacing Amplitude: 2.5 V
Lead Channel Setting Pacing Amplitude: 2.75 V
Lead Channel Setting Pacing Pulse Width: 0.4 ms
Lead Channel Setting Sensing Sensitivity: 2 mV

## 2019-02-25 ENCOUNTER — Ambulatory Visit
Admission: RE | Admit: 2019-02-25 | Discharge: 2019-02-25 | Disposition: A | Discharge status: Home / Self Care | Payer: No Typology Code available for payment source | Source: Ambulatory Visit | Attending: Neurology | Admitting: Neurology

## 2019-02-25 ENCOUNTER — Other Ambulatory Visit: Payer: Self-pay

## 2019-02-25 DIAGNOSIS — R29898 Other symptoms and signs involving the musculoskeletal system: Secondary | ICD-10-CM

## 2019-02-25 DIAGNOSIS — R3915 Urgency of urination: Secondary | ICD-10-CM

## 2019-02-25 DIAGNOSIS — M4802 Spinal stenosis, cervical region: Secondary | ICD-10-CM

## 2019-02-28 ENCOUNTER — Telehealth: Payer: Self-pay | Admitting: *Deleted

## 2019-02-28 ENCOUNTER — Ambulatory Visit (INDEPENDENT_AMBULATORY_CARE_PROVIDER_SITE_OTHER): Payer: No Typology Code available for payment source | Admitting: Nurse Practitioner

## 2019-02-28 ENCOUNTER — Encounter: Payer: Self-pay | Admitting: Nurse Practitioner

## 2019-02-28 VITALS — Ht 66.0 in | Wt 185.0 lb

## 2019-02-28 DIAGNOSIS — R252 Cramp and spasm: Secondary | ICD-10-CM

## 2019-02-28 DIAGNOSIS — R519 Headache, unspecified: Secondary | ICD-10-CM

## 2019-02-28 DIAGNOSIS — R51 Headache: Secondary | ICD-10-CM

## 2019-02-28 MED ORDER — GABAPENTIN 100 MG PO CAPS: 100.0000 mg | ORAL_CAPSULE | Freq: Every day | ORAL | 0 refills | Status: DC

## 2019-02-28 NOTE — Progress Notes (Signed)
Virtual Visit via Video Note  I connected with Emma Rogers on 02/28/19 at  4:00 PM EDT by a video enabled telemedicine application and verified that I am speaking with the correct person using two identifiers.   Staff discussed the limitations of evaluation and management by telemedicine and the availability of in person appointments. The patient expressed understanding and agreed to proceed.  Patient location: car My location: home office Other people present:  none HPI  Patient endorses headaches behind left eye and pressure in left ear. Scalp has become painful, endorses  Headache She complains of a of headache. She does have a headache at this time.  Description of Headaches: Location of pain: left-sided unilateral Radiation of pain?:retro-orbital Character of pain:aching and shooting Severity of pain: 4 Accompanying symptoms: nausea, vomiting, photophobia, tinnitus, rhinorrhea Rapidity of onset: gradual Duration of individual headache: 3 days Are most headaches similar in presentation? First headache like this   Has had daily persistent headache for 5 days back in 2011 had CT scan that was negative. States feels similar to then with these new headaches- waking up every morning with them and return later in the day as well.  Has tried Excedrin migraine and maxalt. States maxalt that gets rid of headache but comes back when medicine resolves. She does have a history of migraines and takes topamax- states since being on topamax has not had a migraine since taking topamax. She states this does not feel like her migraine pain. States her scalp feel sensitive to touch along the side of her headache.   Endorses leg cramps- worse at night over the past few weeks, she is having leg cramps in the daytime as well, but more often at night. Improvement with vigorous rubbing.   Patient was referred to neurologist for memory impairment. Neurology NP referred patient for sleep study and for  neurocognitive testing. Daughter noticed that when they were shopping and go to the register she is having trouble figuring out what to due. States her boss has also noticed memory issues- notices that she is struggling what she is being told.   PHQ2/9: [facility] screen [facility] 2[facility] 02/28/2019 01/27/2019 01/06/2019 12/01/2018 11/22/2018  Decreased Interest 0 2 2 0 0  Down, Depressed, Hopeless 0 2 2 1  0  PHQ - 2 Score 0 4 4 1  0  Altered sleeping 0 2 0 0 0  Tired, decreased energy 3 2 3 1  0  Change in appetite 0 0 0 0 0  Feeling bad or failure about yourself  1 - 3 0 0  Trouble concentrating 3 2 2 1  0  Moving slowly or fidgety/restless 3 0 2 0 0  Suicidal thoughts 0 0 0 0 0  PHQ-9 Score 10 10 14 3  0  Difficult doing work/chores Somewhat difficult Very difficult Somewhat difficult Not difficult at all Not difficult at all  Some recent data might be hidden     PHQ reviewed. Positive  Patient Active Problem List   Diagnosis Date Noted  . Moderate persistent asthma 01/27/2019  . Left leg weakness 12/30/2018  . Urinary urgency 12/30/2018  . Cervical spinal stenosis 12/30/2018  . Benign essential tremor 12/30/2018  . Chronic venous insufficiency 03/10/2018  . Lymphedema 03/10/2018  . Elevated blood pressure reading 10/13/2017  . Anxiety 10/13/2017  . Familial hyperlipidemia 10/10/2017  . Bilateral leg edema 09/03/2017  . Depression 09/03/2017  . Chronic kidney disease, stage III (moderate) (HCC) 09/03/2017  . Vitamin D deficiency 09/03/2017  . Vitamin B12 deficiency  09/03/2017  . Medication monitoring encounter 09/03/2017  . Insomnia 08/11/2017  . Menopausal symptoms 08/11/2017  . Shortness of breath 05/18/2017  . Chest pain 03/31/2017  . Migraine without aura and without status migrainosus, not intractable 02/21/2017  . Prediabetes 02/20/2017  . Fatigue 02/20/2017  . 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 VASOVAGAL SYNCOPE 11/27/2009  . Allergic rhinitis 01/18/2008  . HEADACHE 01/18/2008     Past Medical History:  Diagnosis Date  . Allergic rhinitis   . Anxiety   . Asthma   . Depression   . Headache(784.0)   . Renal insufficiency    Stage 3 kidney disease.   . Shingles     Past Surgical History:  Procedure Laterality Date  . ABDOMINAL HYSTERECTOMY  2009   still has 1 ovary  . BREAST BIOPSY Left 09/30/2002   Ductogram-neg  . INSERT / REPLACE / REMOVE PACEMAKER     Permanent pacemaker, Dr. Caryl Comes  . Lobe hemithroidectomy     Right    Social History   Tobacco Use  . Smoking status: Never Smoker  . Smokeless tobacco: Never Used  Substance Use Topics  . Alcohol use: No    Alcohol/week: 0.0 standard drinks     Current Outpatient Medications:  .  Albuterol Sulfate (PROAIR RESPICLICK) 378 (90 Base) MCG/ACT AEPB, Inhale 1-2 puffs into the lungs every 4 (four) hours as needed., Disp: 1 each, Rfl: 2 .  atorvastatin (LIPITOR) 40 MG tablet, Take 1 tablet (40 mg total) by mouth at bedtime., Disp: 30 tablet, Rfl: 1 .  budesonide-formoterol (SYMBICORT) 80-4.5 MCG/ACT inhaler, Inhale 2 puffs into the lungs 2 (two) times daily., Disp: 1 Inhaler, Rfl: 12 .  buPROPion (WELLBUTRIN XL) 150 MG 24 hr tablet, TAKE 2 TABLETS BY MOUTH DAILY, Disp: 60 tablet, Rfl: 2 .  cyanocobalamin (,VITAMIN B-12,) 1000 MCG/ML injection, Inject 1 mL (1,000 mcg total) into the muscle every 30 (thirty) days., Disp: , Rfl:  .  furosemide (LASIX) 20 MG tablet, , Disp: , Rfl: 3 .  loratadine (CLARITIN) 10 MG tablet, Take 1 tablet (10 mg total) by mouth daily., Disp: 90 tablet, Rfl: 1 .  mometasone (NASONEX) 50 MCG/ACT nasal spray, Place 2 sprays into the nose daily., Disp: 17 g, Rfl: 2 .  rizatriptan (MAXALT) 10 MG tablet, Take 1 tablet (10 mg total) by mouth as needed for migraine. May repeat in 2 hours if needed, Disp: 10 tablet, Rfl: 1 .  sertraline (ZOLOFT) 50 MG tablet, Take 1.5 tablets (75 mg total) by mouth daily., Disp: 45 tablet, Rfl: 1 .  topiramate (TOPAMAX) 100 MG tablet, Take 1 tablet  (100 mg total) by mouth daily., Disp: 30 tablet, Rfl: 5 .  traZODone (DESYREL) 100 MG tablet, Take 1.5 tablets (150 mg total) by mouth at bedtime as needed. for sleep, Disp: 45 tablet, Rfl: 1 .  VITAMIN D PO, Take 1 tablet by mouth daily., Disp: , Rfl:   Allergies  Allergen Reactions  . Amoxicillin Other (See Comments)  . Clavulanic Acid Other (See Comments)  . Morphine And Related     vomiting    ROS   No other specific complaints in a complete review of systems (except as listed in HPI above).  Objective  Vitals:   02/28/19 1528  Weight: 185 lb (83.9 kg)  Height: 5\' 6"  (1.676 m)     Body mass index is 29.86 kg/m.  Nursing Note and Vital Signs reviewed.  Physical Exam   Constitutional: Patient appears well-developed and well-nourished. No distress.  HENT: Head: Normocephalic and atraumatic. Pulmonary/Chest: Effort normal  Musculoskeletal: Normal range of motion,  Neurological: alert and oriented, speech normal.  Skin: No rash noted. No erythema.  Psychiatric: Patient has a normal mood and affect. behavior is normal. Judgment and thought content normal.    Assessment & Plan 1. Acute nonintractable headache, unspecified headache type Hydration, maxalt PRN - Ambulatory referral to Neurology - gabapentin (NEURONTIN) 100 MG capsule; Take 1 capsule (100 mg total) by mouth at bedtime.  Dispense: 30 capsule; Refill: 0  2. Leg cramps - gabapentin (NEURONTIN) 100 MG capsule; Take 1 capsule (100 mg total) by mouth at bedtime.  Dispense: 30 capsule; Refill: 0 - Basic Metabolic Panel (BMET) - Magnesium    Follow Up Instructions:  PRN  I discussed the assessment and treatment plan with the patient. The patient was provided an opportunity to ask questions and all were answered. The patient agreed with the plan and demonstrated an understanding of the instructions.   The patient was advised to call back or seek an in-person evaluation if the symptoms worsen or if the  condition fails to improve as anticipated.  I provided 26 minutes of non-face-to-face time during this encounter.   Cheryle HorsfallElizabeth E Joselle Deeds, NP

## 2019-02-28 NOTE — Telephone Encounter (Signed)
-----   Message from Britt Bottom, MD sent at 02/25/2019 12:22 PM EDT ----- Please let the patient know that the CT scans shows more degenerative changes at [facility] that could cause neck pain and maybe left arm pain but no spinal stenosis or pressure on spinal cord

## 2019-03-01 ENCOUNTER — Other Ambulatory Visit: Payer: Self-pay

## 2019-03-01 ENCOUNTER — Encounter: Payer: Self-pay | Admitting: Licensed Clinical Social Worker

## 2019-03-01 ENCOUNTER — Ambulatory Visit (INDEPENDENT_AMBULATORY_CARE_PROVIDER_SITE_OTHER): Payer: No Typology Code available for payment source | Admitting: Licensed Clinical Social Worker

## 2019-03-01 DIAGNOSIS — F330 Major depressive disorder, recurrent, mild: Secondary | ICD-10-CM | POA: Diagnosis not present

## 2019-03-01 NOTE — Progress Notes (Signed)
Virtual Visit via Telephone Note  I connected with Emma Rogers on 03/01/19 at 11:00 AM EDT by telephone and verified that I am speaking with the correct person using two identifiers.   I discussed the limitations, risks, security and privacy concerns of performing an evaluation and management service by telephone and the availability of in person appointments. I also discussed with the patient that there may be a patient responsible charge related to this service. The patient expressed understanding and agreed to proceed.  I discussed the assessment and treatment plan with the patient. The patient was provided an opportunity to ask questions and all were answered. The patient agreed with the plan and demonstrated an understanding of the instructions.   The patient was advised to call back or seek an in-person evaluation if the symptoms worsen or if the condition fails to improve as anticipated.  I provided 45 minutes of non-face-to-face time during this encounter.   Alden Hipp, LCSW    THERAPIST PROGRESS NOTE  Session Time: 1100  Participation Level: Active  Behavioral Response: NeatAlertAnxious  Type of Therapy: Individual Therapy  Treatment Goals addressed: Anxiety  Interventions: CBT  Summary: Emma Rogers is a 53 y.o. female who presents with continued symptoms related to her diagnosis. Emma Rogers reports doing well since our last session. She reports she has been experiencing cognitive and memory problems more frequently which has her worried. She stated her doctor referred her to a neuropsychologist for additional testing, but she has not heard back and that was in May. LCSW encouraged Emma Rogers to focus on the parts of the situation she could control--such as following up with the doctor about the referral. Emma Rogers expressed anxiety around that as she does not want to be a "bad patient." LCSW highlighted the ways she could do this without being abrasive, and advocating for  herself is an important part of improving self-esteem. Emma Rogers was able to recognize this and expressed agreement. She reported she planned to follow up.  LCSW also provided Emma Rogers (via email) with several handouts she could utilize to challenge negative thoughts. We discussed CBT and how it could be utilized in this situation. Emma Rogers expressed understanding and agreement. Additionally, Emma Rogers reported having a discussion with her husband following an argument where she expressed how his comments made her feel and that he "has broken her." She reported after that conversation, "something changed and he's been a lot better. I don't know if it was because of what I said or what." LCSW encouraged Emma Rogers to allow herself to accept credit for that change, as it was likely directly connected to that conversation. Emma Rogers expressed agreement. LCSW validated how difficult it must have been for Emma Rogers to express herself in that way, but encouraged her to recognize the change it made in her life. Emma Rogers expresed agreement.   Suicidal/Homicidal: No  Therapist Response: Emma Rogers continues to work towards her tx goals but has not yet reached them. We will continue to work on emotional regulation skills and improving communication as well.   Plan: Return again in 2 weeks.  Diagnosis: Axis I: MDD    Axis II: No diagnosis    Alden Hipp, LCSW 03/01/2019

## 2019-03-02 ENCOUNTER — Encounter: Payer: Self-pay | Admitting: Cardiology

## 2019-03-02 ENCOUNTER — Encounter: Payer: Self-pay | Admitting: Family Medicine

## 2019-03-02 ENCOUNTER — Telehealth: Payer: Self-pay | Admitting: Internal Medicine

## 2019-03-02 LAB — BASIC METABOLIC PANEL
BUN/Creatinine Ratio: 21 (calc) (ref 6–22)
BUN: 24 mg/dL (ref 7–25)
CO2: 27 mmol/L (ref 20–32)
Calcium: 9.1 mg/dL (ref 8.6–10.4)
Chloride: 105 mmol/L (ref 98–110)
Creat: 1.14 mg/dL — ABNORMAL HIGH (ref 0.50–1.05)
Glucose, Bld: 78 mg/dL (ref 65–99)
Potassium: 3.8 mmol/L (ref 3.5–5.3)
Sodium: 139 mmol/L (ref 135–146)

## 2019-03-02 LAB — MAGNESIUM: Magnesium: 1.9 mg/dL (ref 1.5–2.5)

## 2019-03-02 NOTE — Telephone Encounter (Signed)
Called and spoke with Emma Rogers, nurse reviewer with Emma Rogers.  Two orders had been submitted for sleep studies, [id number] was submitted on 02/07/2019 for Dr. Mortimer Fries and an in lab study procedure code [id number] by Dr. Chipper Herb which this one was approved.  I contacted patient who stated that she wanted to keep the in lab study ordered by Dr. Kristine Linea.  Pt also stated that she wanted to cancel the HST, PFT and cancel the return OV with Dr. Mortimer Fries.  Called nurse reviewer Emma Rogers with [facility] back and advised that we were going to cancel the HST and patient would be proceeding with the in lab.  Pt has requested that all appointments with [place] to be canceled. I have canceled all appointments and have sent this message to physician as a FYI. Rhonda J Cobb

## 2019-03-02 NOTE — Progress Notes (Signed)
Remote pacemaker transmission.   

## 2019-03-04 ENCOUNTER — Ambulatory Visit (INDEPENDENT_AMBULATORY_CARE_PROVIDER_SITE_OTHER): Payer: No Typology Code available for payment source

## 2019-03-04 ENCOUNTER — Encounter: Payer: Self-pay | Admitting: Emergency Medicine

## 2019-03-04 ENCOUNTER — Ambulatory Visit
Admission: EM | Admit: 2019-03-04 | Discharge: 2019-03-04 | Disposition: A | Discharge status: Home / Self Care | Payer: No Typology Code available for payment source | Attending: Family Medicine | Admitting: Family Medicine

## 2019-03-04 ENCOUNTER — Other Ambulatory Visit: Payer: Self-pay

## 2019-03-04 DIAGNOSIS — M545 Low back pain: Secondary | ICD-10-CM | POA: Diagnosis not present

## 2019-03-04 DIAGNOSIS — M5432 Sciatica, left side: Secondary | ICD-10-CM

## 2019-03-04 MED ORDER — CYCLOBENZAPRINE HCL 5 MG PO TABS: 5.0000 mg | ORAL_TABLET | Freq: Three times a day (TID) | ORAL | 0 refills | Status: DC | PRN

## 2019-03-04 MED ORDER — HYDROCODONE-ACETAMINOPHEN 5-325 MG PO TABS: ORAL_TABLET | ORAL | 0 refills | Status: DC

## 2019-03-04 NOTE — ED Triage Notes (Signed)
Patient c/o headache, neck pain, pressure behind her eyes that started 1 week ago. She states Wednesday she started having pain in her left arm and numbness, left side pain, back pain and hip pain. She states she can't lift her lower leg. She states she feels a pressure to urinate even after she has gone to the bathroom.  She reports ongoing issues with memory/cognitive that she sees a neurologist for. She has had CT neck and spine on February 25, 2019. She states there were no changes since her prior CT scan.

## 2019-03-04 NOTE — ED Provider Notes (Signed)
[facility]    CSN: [id number] Arrival date & time: 03/04/19  1317     History   Chief Complaint Chief Complaint  Patient presents with  . Headache  . Neck Pain  . Numbness    HPI Thad RangerLorrie H Carter is a 53 y.o. female.   53 yo female with a c/o low back pain radiating down the left leg for the past 3 days. Denies any falls, injuries, fevers, chills, lower extremity numbness/tingling. However states she's had a lumbar fusion surgery years ago. States she's also had some other chronic issues with her neck and left arm for which she had cervical spine and thoracic spine CTs.    Headache Associated symptoms: neck pain   Neck Pain Associated symptoms: headaches     Past Medical History:  Diagnosis Date  . Allergic rhinitis   . Anxiety   . Asthma   . Depression   . Headache(784.0)   . Renal insufficiency    Stage 3 kidney disease.   . Shingles     Patient Active Problem List   Diagnosis Date Noted  . Moderate persistent asthma 01/27/2019  . Left leg weakness 12/30/2018  . Urinary urgency 12/30/2018  . Cervical spinal stenosis 12/30/2018  . Benign essential tremor 12/30/2018  . Chronic venous insufficiency 03/10/2018  . Lymphedema 03/10/2018  . Elevated blood pressure reading 10/13/2017  . Anxiety 10/13/2017  . Familial hyperlipidemia 10/10/2017  . Bilateral leg edema 09/03/2017  . Depression 09/03/2017  . Chronic kidney disease, stage III (moderate) (HCC) 09/03/2017  . Vitamin D deficiency 09/03/2017  . Vitamin B12 deficiency 09/03/2017  . Medication monitoring encounter 09/03/2017  . Insomnia 08/11/2017  . Menopausal symptoms 08/11/2017  . Shortness of breath 05/18/2017  . Chest pain 03/31/2017  . Migraine without aura and without status migrainosus, not intractable 02/21/2017  . Prediabetes 02/20/2017  . Fatigue 02/20/2017  . 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 VASOVAGAL SYNCOPE 11/27/2009  . Allergic rhinitis 01/18/2008  . HEADACHE 01/18/2008     Past Surgical History:  Procedure Laterality Date  . ABDOMINAL HYSTERECTOMY  2009   still has 1 ovary  . BREAST BIOPSY Left 09/30/2002   Ductogram-neg  . INSERT / REPLACE / REMOVE PACEMAKER     Permanent pacemaker, Dr. Graciela HusbandsKlein  . Lobe hemithroidectomy     Right    OB History   No obstetric history on file.      Home Medications    Prior to Admission medications   Medication Sig Start Date End Date Taking? Authorizing Provider  Albuterol Sulfate (PROAIR RESPICLICK) 108 (90 Base) MCG/ACT AEPB Inhale 1-2 puffs into the lungs every 4 (four) hours as needed. 12/10/18  Yes Erin FullingKasa, Kurian, MD  atorvastatin (LIPITOR) 40 MG tablet Take 1 tablet (40 mg total) by mouth at bedtime. 11/04/18  Yes [name], Janit BernMelinda P, MD  budesonide-formoterol (SYMBICORT) 80-4.5 MCG/ACT inhaler Inhale 2 puffs into the lungs 2 (two) times daily. 12/10/18  Yes Kasa, Wallis BambergKurian, MD  buPROPion (WELLBUTRIN XL) 150 MG 24 hr tablet TAKE 2 TABLETS BY MOUTH DAILY 01/07/19  Yes [name], Janit BernMelinda P, MD  cyanocobalamin (,VITAMIN B-12,) 1000 MCG/ML injection Inject 1 mL (1,000 mcg total) into the muscle every 30 (thirty) days. 11/03/18  Yes [name], Janit BernMelinda P, MD  furosemide (LASIX) 20 MG tablet  02/11/18  Yes [provider]  gabapentin (NEURONTIN) 100 MG capsule Take 1 capsule (100 mg total) by mouth at bedtime. 02/28/19  Yes [name], Percell BeltElizabeth E, NP  loratadine (CLARITIN) 10 MG tablet Take  1 tablet (10 mg total) by mouth daily. 12/01/18  Yes Doren CustardBoyce, Emily E, FNP  mometasone (NASONEX) 50 MCG/ACT nasal spray Place 2 sprays into the nose daily. 12/10/18 12/10/19 Yes Kasa, Wallis BambergKurian, MD  rizatriptan (MAXALT) 10 MG tablet Take 1 tablet (10 mg total) by mouth as needed for migraine. May repeat in 2 hours if needed 08/17/18  Yes [name], Percell BeltElizabeth E, NP  sertraline (ZOLOFT) 50 MG tablet Take 1.5 tablets (75 mg total) by mouth daily. 01/25/19  Yes Jomarie LongsEappen, Saramma, MD  topiramate (TOPAMAX) 100 MG tablet Take 1 tablet (100 mg total) by mouth daily. 08/17/18   Yes [name], Percell BeltElizabeth E, NP  traZODone (DESYREL) 100 MG tablet Take 1.5 tablets (150 mg total) by mouth at bedtime as needed. for sleep 01/25/19  Yes Eappen, Levin BaconSaramma, MD  VITAMIN D PO Take 1 tablet by mouth daily.   Yes [provider]  cyclobenzaprine (FLEXERIL) 5 MG tablet Take 1 tablet (5 mg total) by mouth 3 (three) times daily as needed for muscle spasms. 03/04/19   Payton Mccallumonty, Tiyon Sanor, MD  HYDROcodone-acetaminophen (NORCO/VICODIN) 5-325 MG tablet 1-2 tabs po bid prn 03/04/19   Payton Mccallumonty, Seraphim Affinito, MD    Family History Family History  Problem Relation Age of Onset  . Heart disease Father        pacemaker,arrythmias  . Lung disease Father   . Asthma Other   . Coronary artery disease Other        Female 1st degree relative <50  . Lung cancer Other   . Prostate cancer Other        1st degree relative <50  . COPD Maternal Grandmother   . Breast cancer Maternal Grandmother   . Heart disease Maternal Grandfather   . Liver disease Paternal Grandmother   . Heart disease Paternal Grandfather     Social History Social History   Tobacco Use  . Smoking status: Never Smoker  . Smokeless tobacco: Never Used  Substance Use Topics  . Alcohol use: No    Alcohol/week: 0.0 standard drinks  . Drug use: No     Allergies   Amoxicillin, Clavulanic acid, and Morphine and related   Review of Systems Review of Systems  Musculoskeletal: Positive for neck pain.  Neurological: Positive for headaches.     Physical Exam Triage Vital Signs ED Triage Vitals  Enc Vitals Group     BP 03/04/19 1355 111/70     Pulse Rate 03/04/19 1355 76     Resp 03/04/19 1355 18     Temp 03/04/19 1355 98.7 F (37.1 C)     Temp Source 03/04/19 1355 Oral     SpO2 03/04/19 1355 100 %     Weight 03/04/19 1356 185 lb (83.9 kg)     Height 03/04/19 1356 5\' 6"  (1.676 m)     Head Circumference --      Peak Flow --      Pain Score 03/04/19 1356 3     Pain Loc --      Pain Edu? --      Excl. in GC? --    No  data found.  Updated Vital Signs BP 111/70 (BP Location: Left Arm)   Pulse 76   Temp 98.7 F (37.1 C) (Oral)   Resp 18   Ht 5\' 6"  (1.676 m)   Wt 83.9 kg   SpO2 100%   BMI 29.86 kg/m   Visual Acuity Right Eye Distance:   Left Eye Distance:   Bilateral Distance:  Right Eye Near:   Left Eye Near:    Bilateral Near:     Physical Exam Vitals signs and nursing note reviewed.  Constitutional:      General: She is not in acute distress.    Appearance: She is well-developed. She is not diaphoretic.  Eyes:     Extraocular Movements: Extraocular movements intact.     Pupils: Pupils are equal, round, and reactive to light.  Musculoskeletal:        General: Tenderness present.     Lumbar back: She exhibits tenderness (over the lumbar paraspinous muscles and left buttock) and spasm. She exhibits normal range of motion, no bony tenderness, no swelling, no edema, no deformity, no laceration, no pain and normal pulse.  Skin:    General: Skin is warm and dry.     Findings: No erythema or rash.  Neurological:     General: No focal deficit present.     Mental Status: She is alert.     Cranial Nerves: No cranial nerve deficit.     Motor: No abnormal muscle tone.     Coordination: Coordination normal.     Deep Tendon Reflexes: Reflexes are normal and symmetric. Reflexes normal.      UC Treatments / Results  Labs (all labs ordered are listed, but only abnormal results are displayed) Labs Reviewed - No data to display  EKG None  Radiology Dg Lumbar Spine Complete  Result Date: 03/04/2019 CLINICAL DATA:  Low back pain radiating into the left leg EXAM: LUMBAR SPINE - COMPLETE 4+ VIEW COMPARISON:  12/09/2011 FINDINGS: Five lumbar type vertebral bodies are well visualized. Changes of prior fusion at L4-5 noted. No pars defects are seen. No anterolisthesis is noted. No hardware abnormality is noted. No soft tissue abnormality is noted. Mild retained fecal material is seen. IMPRESSION:  Postsurgical change without acute abnormality. Electronically Signed   By: Inez Catalina M.D.   On: 03/04/2019 15:15    Procedures Procedures (including critical care time)  Medications Ordered in UC Medications - No data to display  Initial Impression / Assessment and Plan / UC Course  I have reviewed the triage vital signs and the nursing notes.  Pertinent labs & imaging results that were available during my care of the patient were reviewed by me and considered in my medical decision making (see chart for details).      Final Clinical Impressions(s) / UC Diagnoses   Final diagnoses:  Sciatica of left side    ED Prescriptions    Medication Sig Dispense Auth. Provider   cyclobenzaprine (FLEXERIL) 5 MG tablet Take 1 tablet (5 mg total) by mouth 3 (three) times daily as needed for muscle spasms. 30 tablet Norval Gable, MD   HYDROcodone-acetaminophen (NORCO/VICODIN) 5-325 MG tablet 1-2 tabs po bid prn 10 tablet Norval Gable, MD      1. x-ray results and diagnosis reviewed with patient 2. rx as per orders above; reviewed possible side effects, interactions, risks and benefits  3. Follow-up prn if symptoms worsen or don't improve   Controlled Substance Prescriptions [place] Controlled Substance Registry consulted? Not Applicable   Norval Gable, MD 03/04/19 1526

## 2019-03-08 DIAGNOSIS — R569 Unspecified convulsions: Secondary | ICD-10-CM | POA: Insufficient documentation

## 2019-03-08 DIAGNOSIS — G8929 Other chronic pain: Secondary | ICD-10-CM | POA: Insufficient documentation

## 2019-03-08 DIAGNOSIS — M545 Low back pain, unspecified: Secondary | ICD-10-CM | POA: Insufficient documentation

## 2019-03-08 DIAGNOSIS — M25552 Pain in left hip: Secondary | ICD-10-CM | POA: Insufficient documentation

## 2019-03-15 ENCOUNTER — Ambulatory Visit: Payer: No Typology Code available for payment source

## 2019-03-21 NOTE — Telephone Encounter (Signed)
Ria Comment, please advise if this encounter can be closed. Thanks

## 2019-03-22 ENCOUNTER — Ambulatory Visit (INDEPENDENT_AMBULATORY_CARE_PROVIDER_SITE_OTHER): Payer: No Typology Code available for payment source | Admitting: Licensed Clinical Social Worker

## 2019-03-22 ENCOUNTER — Other Ambulatory Visit: Payer: Self-pay

## 2019-03-22 ENCOUNTER — Encounter: Payer: Self-pay | Admitting: Licensed Clinical Social Worker

## 2019-03-22 DIAGNOSIS — F411 Generalized anxiety disorder: Secondary | ICD-10-CM

## 2019-03-22 DIAGNOSIS — F330 Major depressive disorder, recurrent, mild: Secondary | ICD-10-CM
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
## 2019-03-23 ENCOUNTER — Other Ambulatory Visit: Payer: Self-pay | Admitting: Nurse Practitioner

## 2019-03-23 DIAGNOSIS — G43009 Migraine without aura, not intractable, without status migrainosus: Secondary | ICD-10-CM

## 2019-03-28 ENCOUNTER — Ambulatory Visit: Payer: No Typology Code available for payment source | Admitting: Internal Medicine

## 2019-03-29 ENCOUNTER — Other Ambulatory Visit: Payer: Self-pay

## 2019-03-29 ENCOUNTER — Ambulatory Visit (INDEPENDENT_AMBULATORY_CARE_PROVIDER_SITE_OTHER): Payer: No Typology Code available for payment source | Admitting: Family Medicine

## 2019-03-29 ENCOUNTER — Encounter: Payer: Self-pay | Admitting: Family Medicine

## 2019-03-29 VITALS — BP 115/78 | HR 80 | Temp 98.5°F

## 2019-03-29 DIAGNOSIS — Z950 Presence of cardiac pacemaker: Secondary | ICD-10-CM

## 2019-03-29 DIAGNOSIS — E538 Deficiency of other specified B group vitamins: Secondary | ICD-10-CM

## 2019-03-29 DIAGNOSIS — E7849 Other hyperlipidemia: Secondary | ICD-10-CM

## 2019-03-29 DIAGNOSIS — N183 Chronic kidney disease, stage 3 unspecified: Secondary | ICD-10-CM

## 2019-03-29 DIAGNOSIS — G43009 Migraine without aura, not intractable, without status migrainosus: Secondary | ICD-10-CM

## 2019-03-29 DIAGNOSIS — J4540 Moderate persistent asthma, uncomplicated: Secondary | ICD-10-CM

## 2019-03-29 DIAGNOSIS — H814 Vertigo of central origin: Secondary | ICD-10-CM

## 2019-03-29 DIAGNOSIS — F419 Anxiety disorder, unspecified: Secondary | ICD-10-CM

## 2019-03-29 DIAGNOSIS — G4701 Insomnia due to medical condition: Secondary | ICD-10-CM

## 2019-03-29 DIAGNOSIS — R7303 Prediabetes: Secondary | ICD-10-CM

## 2019-03-29 DIAGNOSIS — R9389 Abnormal findings on diagnostic imaging of other specified body structures: Secondary | ICD-10-CM

## 2019-03-29 DIAGNOSIS — F329 Major depressive disorder, single episode, unspecified: Secondary | ICD-10-CM

## 2019-03-29 DIAGNOSIS — E559 Vitamin D deficiency, unspecified: Secondary | ICD-10-CM

## 2019-03-29 DIAGNOSIS — F32A Depression, unspecified: Secondary | ICD-10-CM

## 2019-03-29 DIAGNOSIS — R42 Dizziness and giddiness: Secondary | ICD-10-CM

## 2019-03-29 LAB — UA/M W/RFLX CULTURE, ROUTINE
Bilirubin, UA: NEGATIVE
Glucose, UA: NEGATIVE
Ketones, UA: NEGATIVE
Leukocytes,UA: NEGATIVE
Nitrite, UA: NEGATIVE
Protein,UA: NEGATIVE
RBC, UA: NEGATIVE
Specific Gravity, UA: >1.03 — ABNORMAL HIGH (ref 1.005–1.030)
Urobilinogen, Ur: 0.2 mg/dL (ref 0.2–1.0)
pH, UA: 5.5 (ref 5.0–7.5)

## 2019-03-29 LAB — MICROALBUMIN, URINE WAIVED
Creatinine, Urine Waived: 100 mg/dL (ref 10–300)
Microalb, Ur Waived: 10 mg/L (ref 0–19)
Microalb/Creat Ratio: <30 mg/g (ref <30)

## 2019-03-29 LAB — BAYER DCA HB A1C WAIVED: HB A1C (BAYER DCA - WAIVED): 5.5 % (ref <7.0)

## 2019-03-29 MED ORDER — ATORVASTATIN CALCIUM 40 MG PO TABS: 40.0000 mg | ORAL_TABLET | Freq: Every day | ORAL | 1 refills | Status: DC

## 2019-03-29 MED ORDER — TRAZODONE HCL 100 MG PO TABS: 200.0000 mg | ORAL_TABLET | Freq: Every evening | ORAL | 1 refills | Status: DC | PRN
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
## 2019-03-30 LAB — CBC WITH DIFFERENTIAL/PLATELET
Basophils Absolute: 0.1 10*3/uL (ref 0.0–0.2)
Basos: 1 %
EOS (ABSOLUTE): 0.2 10*3/uL (ref 0.0–0.4)
Eos: 2 %
Hematocrit: 44.2 % (ref 34.0–46.6)
Hemoglobin: 14.9 g/dL (ref 11.1–15.9)
Immature Grans (Abs): 0 10*3/uL (ref 0.0–0.1)
Immature Granulocytes: 0 %
Lymphocytes Absolute: 2.2 10*3/uL (ref 0.7–3.1)
Lymphs: 27 %
MCH: 30.8 pg (ref 26.6–33.0)
MCHC: 33.7 g/dL (ref 31.5–35.7)
MCV: 92 fL (ref 79–97)
Monocytes Absolute: 0.4 10*3/uL (ref 0.1–0.9)
Monocytes: 6 %
Neutrophils Absolute: 5.1 10*3/uL (ref 1.4–7.0)
Neutrophils: 64 %
Platelets: 273 10*3/uL (ref 150–450)
RBC: 4.83 x10E6/uL (ref 3.77–5.28)
RDW: 12.8 % (ref 11.7–15.4)
WBC: 7.9 10*3/uL (ref 3.4–10.8)

## 2019-03-30 LAB — COMPREHENSIVE METABOLIC PANEL
ALT: 9 IU/L (ref 0–32)
AST: 12 IU/L (ref 0–40)
Albumin/Globulin Ratio: 1.8 (ref 1.2–2.2)
Albumin: 4.8 g/dL (ref 3.8–4.9)
Alkaline Phosphatase: 58 IU/L (ref 39–117)
BUN/Creatinine Ratio: 22 (ref 9–23)
BUN: 28 mg/dL — ABNORMAL HIGH (ref 6–24)
Bilirubin Total: 0.3 mg/dL (ref 0.0–1.2)
CO2: 24 mmol/L (ref 20–29)
Calcium: 9.5 mg/dL (ref 8.7–10.2)
Chloride: 102 mmol/L (ref 96–106)
Creatinine, Ser: 1.26 mg/dL — ABNORMAL HIGH (ref 0.57–1.00)
GFR calc Af Amer: 57 mL/min/{1.73_m2} — ABNORMAL LOW (ref >59)
GFR calc non Af Amer: 49 mL/min/{1.73_m2} — ABNORMAL LOW (ref >59)
Globulin, Total: 2.6 g/dL (ref 1.5–4.5)
Glucose: 92 mg/dL (ref 65–99)
Potassium: 3.8 mmol/L (ref 3.5–5.2)
Sodium: 141 mmol/L (ref 134–144)
Total Protein: 7.4 g/dL (ref 6.0–8.5)

## 2019-03-30 LAB — LIPID PANEL W/O CHOL/HDL RATIO
Cholesterol, Total: 278 mg/dL — ABNORMAL HIGH (ref 100–199)
HDL: 60 mg/dL (ref >39)
LDL Calculated: 183 mg/dL — ABNORMAL HIGH (ref 0–99)
Triglycerides: 176 mg/dL — ABNORMAL HIGH (ref 0–149)
VLDL Cholesterol Cal: 35 mg/dL (ref 5–40)

## 2019-03-30 LAB — B12 AND FOLATE PANEL
Folate: 3.4 ng/mL (ref >3.0)
Vitamin B-12: 236 pg/mL (ref 232–1245)

## 2019-03-30 LAB — VITAMIN D 25 HYDROXY (VIT D DEFICIENCY, FRACTURES): Vit D, 25-Hydroxy: 31.8 ng/mL (ref 30.0–100.0)

## 2019-03-30 LAB — TSH: TSH: 1.08 u[IU]/mL (ref 0.450–4.500)

## 2019-03-31 ENCOUNTER — Ambulatory Visit: Payer: No Typology Code available for payment source | Attending: Neurology

## 2019-04-03 ENCOUNTER — Encounter: Payer: Self-pay | Admitting: Family Medicine

## 2019-04-03 DIAGNOSIS — R9389 Abnormal findings on diagnostic imaging of other specified body structures: Secondary | ICD-10-CM

## 2019-04-03 HISTORY — DX: Abnormal findings on diagnostic imaging of other specified body structures: R93.89

## 2019-04-03 NOTE — Assessment & Plan Note (Signed)
Rechecking labs today. Await results. Call with any concerns.  

## 2019-04-03 NOTE — Assessment & Plan Note (Signed)
Question on cholesteatoma on previous MRI- never followed up on. Will get her set up for CT. Await results. Call with any concerns.

## 2019-04-03 NOTE — Assessment & Plan Note (Signed)
Follows with psychiatry. Call with any concerns. Continue to monitor. Stable at this time.  

## 2019-04-03 NOTE — Assessment & Plan Note (Signed)
Follows with pulmonology. Stable. Continue to monitor. Call with any concerns.  

## 2019-04-03 NOTE — Assessment & Plan Note (Signed)
Stable on current regimen. Continue to monitor. Continues with bad headaches though- concern about cholesteotoma- will check CT head. Await results.

## 2019-04-03 NOTE — Assessment & Plan Note (Signed)
Stable. Continue to monitor. Call with any concerns.  ?

## 2019-04-03 NOTE — Assessment & Plan Note (Signed)
Follows with psychiatry. Call with any concerns. Continue to monitor. Stable at this time.

## 2019-04-05 ENCOUNTER — Ambulatory Visit
Admission: RE | Admit: 2019-04-05 | Discharge: 2019-04-05 | Disposition: A | Discharge status: Home / Self Care | Payer: No Typology Code available for payment source | Source: Ambulatory Visit | Attending: Family Medicine | Admitting: Family Medicine

## 2019-04-05 ENCOUNTER — Other Ambulatory Visit: Payer: Self-pay

## 2019-04-05 DIAGNOSIS — R9389 Abnormal findings on diagnostic imaging of other specified body structures: Secondary | ICD-10-CM

## 2019-04-05 DIAGNOSIS — R42 Dizziness and giddiness: Secondary | ICD-10-CM | POA: Diagnosis present

## 2019-04-05 DIAGNOSIS — H814 Vertigo of central origin: Secondary | ICD-10-CM | POA: Diagnosis present

## 2019-04-06 ENCOUNTER — Ambulatory Visit: Payer: No Typology Code available for payment source | Admitting: Family Medicine

## 2019-04-06 ENCOUNTER — Telehealth: Payer: Self-pay | Admitting: Family Medicine

## 2019-04-06 NOTE — Telephone Encounter (Signed)
Pt called in to let provider know that she had ct done. Scheduled her for 4 week fu 8/4. Please advise if it needs to be sooner.

## 2019-04-08 NOTE — Telephone Encounter (Signed)
mychart message sent

## 2019-04-09 ENCOUNTER — Encounter: Payer: Self-pay | Admitting: Family Medicine

## 2019-04-09 DIAGNOSIS — R42 Dizziness and giddiness: Secondary | ICD-10-CM

## 2019-04-09 DIAGNOSIS — R9389 Abnormal findings on diagnostic imaging of other specified body structures: Secondary | ICD-10-CM

## 2019-04-13 DIAGNOSIS — G479 Sleep disorder, unspecified: Secondary | ICD-10-CM | POA: Insufficient documentation

## 2019-04-14 ENCOUNTER — Ambulatory Visit (INDEPENDENT_AMBULATORY_CARE_PROVIDER_SITE_OTHER): Payer: No Typology Code available for payment source | Admitting: Licensed Clinical Social Worker

## 2019-04-14 ENCOUNTER — Encounter: Payer: Self-pay | Admitting: Licensed Clinical Social Worker

## 2019-04-14 DIAGNOSIS — F330 Major depressive disorder, recurrent, mild: Secondary | ICD-10-CM

## 2019-04-14 DIAGNOSIS — F411 Generalized anxiety disorder: Secondary | ICD-10-CM | POA: Diagnosis not present
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
## 2019-04-15 ENCOUNTER — Ambulatory Visit: Payer: No Typology Code available for payment source | Attending: Neurology

## 2019-04-15 DIAGNOSIS — G479 Sleep disorder, unspecified: Secondary | ICD-10-CM | POA: Diagnosis present

## 2019-04-18 ENCOUNTER — Other Ambulatory Visit: Payer: Self-pay

## 2019-04-26 ENCOUNTER — Encounter: Payer: Self-pay | Admitting: Family Medicine

## 2019-04-26 ENCOUNTER — Ambulatory Visit: Payer: No Typology Code available for payment source

## 2019-04-26 ENCOUNTER — Ambulatory Visit (INDEPENDENT_AMBULATORY_CARE_PROVIDER_SITE_OTHER): Payer: No Typology Code available for payment source | Admitting: Family Medicine

## 2019-04-26 ENCOUNTER — Other Ambulatory Visit: Payer: Self-pay

## 2019-04-26 VITALS — BP 121/77 | HR 75 | Temp 98.6°F | Ht 64.17 in | Wt 194.0 lb

## 2019-04-26 DIAGNOSIS — H814 Vertigo of central origin: Secondary | ICD-10-CM | POA: Diagnosis not present

## 2019-04-26 DIAGNOSIS — G4701 Insomnia due to medical condition: Secondary | ICD-10-CM

## 2019-04-26 DIAGNOSIS — E538 Deficiency of other specified B group vitamins: Secondary | ICD-10-CM

## 2019-04-26 DIAGNOSIS — J4540 Moderate persistent asthma, uncomplicated: Secondary | ICD-10-CM | POA: Diagnosis not present

## 2019-04-26 DIAGNOSIS — G43009 Migraine without aura, not intractable, without status migrainosus: Secondary | ICD-10-CM | POA: Diagnosis not present

## 2019-04-26 DIAGNOSIS — R413 Other amnesia: Secondary | ICD-10-CM

## 2019-04-26 MED ORDER — CYANOCOBALAMIN 1000 MCG PO CAPS: 1.0000 | ORAL_CAPSULE | Freq: Every day | ORAL | 12 refills | Status: DC

## 2019-04-26 NOTE — Assessment & Plan Note (Signed)
Doing well off shots- continue oral. Call with any concerns.

## 2019-04-26 NOTE — Assessment & Plan Note (Signed)
Had sleep study done on 7/24. Waiting on results. Treat as needed. Call with any concerns.

## 2019-04-26 NOTE — Assessment & Plan Note (Signed)
Very concerning to patient. Needs neuropsych testing- ordered today. Will get her 2nd opinion with neurology. Will continue to follow with psychiatry. Call with any concerns. Continue to monitor.

## 2019-04-26 NOTE — Assessment & Plan Note (Signed)
Likely exacerbated by the humidity and heat. Continue her albuterol. Call if not getting better as the heat breaks and we'll do spiro.

## 2019-04-26 NOTE — Assessment & Plan Note (Signed)
Working with ENT and neurology, but would like a 2nd opinion for neurology. Referral generated today. Await their input. Call with any concerns.

## 2019-04-26 NOTE — Progress Notes (Signed)
BP 121/77   Pulse 75   Temp 98.6 F (37 C) (Oral)   Ht 5' 4.17" (1.63 m)   Wt 194 lb (88 kg)   SpO2 98%   BMI 33.12 kg/m    Subjective:    Patient ID: Emma Rogers, female    DOB: [birth year], 53 y.o.   MRN: [id number]  HPI: Emma Rogers is a 53 y.o. female  No chief complaint on file.  Saw neurology about 2 weeks ago. They wanted her to have her sleep study done and were waiting for her EEG results. They want her to continue her trazodone and are concerned that her insomnia is contributing to her seizure-like activity. Her back pain and weakness has improved with her gabapentin. Her EEG thankfully came back nice and normal. She had her sleep study done on 7/24 and is still awaiting the results. She was not particularly happy with her care from neurology, especially with getting results. She also is very anxious- she feels like something is very wrong and that nothing is being done about it. She notes that she continues with memory lapses and confusion. She continues with dizziness. She would like to see another neurologist. She was supposed to have neuropsych testing early in her trying to figure out what's going on, but she was never contacted about the referral and has yet to hear anything. She would like to move forward with that as well.   She also saw ENT- he thinks that it's either vestibular migraine or meniere's disease.  She just had more testing. She is due to see him again soon. She is very happy with the care that she is getting there and is happy with her treatment.   She also notes that she has had some episodes of SOB, usually when she's moving around a lot or when she's outside. She is concerned that her asthma is acting up. She does note that it's worse with the humidity. She is otherwise doing well with no other concerns or complaints at this time.   Relevant past medical, surgical, family and social history reviewed and updated as indicated. Interim medical history  since our last visit reviewed. Allergies and medications reviewed and updated.  Review of Systems  Constitutional: Negative.   Respiratory: Positive for shortness of breath. Negative for apnea, cough, choking, chest tightness, wheezing and stridor.   Cardiovascular: Negative.   Gastrointestinal: Negative.   Musculoskeletal: Negative.   Skin: Negative.   Neurological: Positive for dizziness, speech difficulty, weakness, light-headedness and headaches. Negative for tremors, seizures, syncope, facial asymmetry and numbness.  Psychiatric/Behavioral: Positive for confusion, decreased concentration and sleep disturbance. Negative for agitation, behavioral problems, dysphoric mood, hallucinations, self-injury and suicidal ideas. The patient is nervous/anxious. The patient is not hyperactive.     Per HPI unless specifically indicated above     Objective:    BP 121/77   Pulse 75   Temp 98.6 F (37 C) (Oral)   Ht 5' 4.17" (1.63 m)   Wt 194 lb (88 kg)   SpO2 98%   BMI 33.12 kg/m   Wt Readings from Last 3 Encounters:  04/26/19 194 lb (88 kg)  03/04/19 185 lb (83.9 kg)  02/28/19 185 lb (83.9 kg)    Physical Exam Vitals signs and nursing note reviewed.  Constitutional:      General: She is not in acute distress.    Appearance: Normal appearance. She is not ill-appearing, toxic-appearing or diaphoretic.  HENT:  Head: Normocephalic and atraumatic.     Right Ear: External ear normal.     Left Ear: External ear normal.     Nose: Nose normal.     Mouth/Throat:     Mouth: Mucous membranes are moist.     Pharynx: Oropharynx is clear.  Eyes:     General: No scleral icterus.       Right eye: No discharge.        Left eye: No discharge.     Extraocular Movements: Extraocular movements intact.     Conjunctiva/sclera: Conjunctivae normal.     Pupils: Pupils are equal, round, and reactive to light.  Neck:     Musculoskeletal: Normal range of motion and neck supple.  Cardiovascular:      Rate and Rhythm: Normal rate and regular rhythm.     Pulses: Normal pulses.     Heart sounds: Normal heart sounds. No murmur. No friction rub. No gallop.   Pulmonary:     Effort: Pulmonary effort is normal. No respiratory distress.     Breath sounds: Normal breath sounds. No stridor. No wheezing, rhonchi or rales.  Chest:     Chest wall: No tenderness.  Musculoskeletal: Normal range of motion.  Skin:    General: Skin is warm and dry.     Capillary Refill: Capillary refill takes less than 2 seconds.     Coloration: Skin is not jaundiced or pale.     Findings: No bruising, erythema, lesion or rash.  Neurological:     General: No focal deficit present.     Mental Status: She is alert and oriented to person, place, and time. Mental status is at baseline.  Psychiatric:        Mood and Affect: Mood normal.        Behavior: Behavior normal.        Thought Content: Thought content normal.        Judgment: Judgment normal.     Results for orders placed or performed in visit on 03/29/19  Bayer DCA Hb A1c Waived  Result Value Ref Range   HB A1C (BAYER DCA - WAIVED) 5.5 <7.0 %  CBC with Differential/Platelet  Result Value Ref Range   WBC 7.9 3.4 - 10.8 x10E3/uL   RBC 4.83 3.77 - 5.28 x10E6/uL   Hemoglobin 14.9 11.1 - 15.9 g/dL   Hematocrit 16.144.2 09.634.0 - 46.6 %   MCV 92 79 - 97 fL   MCH 30.8 26.6 - 33.0 pg   MCHC 33.7 31.5 - 35.7 g/dL   RDW 04.512.8 40.911.7 - 81.115.4 %   Platelets 273 150 - 450 x10E3/uL   Neutrophils 64 Not Estab. %   Lymphs 27 Not Estab. %   Monocytes 6 Not Estab. %   Eos 2 Not Estab. %   Basos 1 Not Estab. %   Neutrophils Absolute 5.1 1.4 - 7.0 x10E3/uL   Lymphocytes Absolute 2.2 0.7 - 3.1 x10E3/uL   Monocytes Absolute 0.4 0.1 - 0.9 x10E3/uL   EOS (ABSOLUTE) 0.2 0.0 - 0.4 x10E3/uL   Basophils Absolute 0.1 0.0 - 0.2 x10E3/uL   Immature Granulocytes 0 Not Estab. %   Immature Grans (Abs) 0.0 0.0 - 0.1 x10E3/uL  Comprehensive metabolic panel  Result Value Ref Range    Glucose 92 65 - 99 mg/dL   BUN 28 (H) 6 - 24 mg/dL   Creatinine, Ser 9.141.26 (H) 0.57 - 1.00 mg/dL   GFR calc non Af Amer 49 (L) >59 mL/min/1.73   GFR  calc Af Amer 57 (L) >59 mL/min/1.73   BUN/Creatinine Ratio 22 9 - 23   Sodium 141 134 - 144 mmol/L   Potassium 3.8 3.5 - 5.2 mmol/L   Chloride 102 96 - 106 mmol/L   CO2 24 20 - 29 mmol/L   Calcium 9.5 8.7 - 10.2 mg/dL   Total Protein 7.4 6.0 - 8.5 g/dL   Albumin 4.8 3.8 - 4.9 g/dL   Globulin, Total 2.6 1.5 - 4.5 g/dL   Albumin/Globulin Ratio 1.8 1.2 - 2.2   Bilirubin Total 0.3 0.0 - 1.2 mg/dL   Alkaline Phosphatase 58 39 - 117 IU/L   AST 12 0 - 40 IU/L   ALT 9 0 - 32 IU/L  Lipid Panel w/o Chol/HDL Ratio  Result Value Ref Range   Cholesterol, Total 278 (H) 100 - 199 mg/dL   Triglycerides 176 (H) 0 - 149 mg/dL   HDL 60 >39 mg/dL   VLDL Cholesterol Cal 35 5 - 40 mg/dL   LDL Calculated 183 (H) 0 - 99 mg/dL  Microalbumin, Urine Waived  Result Value Ref Range   Microalb, Ur Waived 10 0 - 19 mg/L   Creatinine, Urine Waived 100 10 - 300 mg/dL   Microalb/Creat Ratio <30 <30 mg/g  TSH  Result Value Ref Range   TSH 1.080 0.450 - 4.500 uIU/mL  UA/M w/rflx Culture, Routine   Specimen: Blood   BLD  Result Value Ref Range   Specific Gravity, UA >1.030 (H) 1.005 - 1.030   pH, UA 5.5 5.0 - 7.5   Color, UA Yellow Yellow   Appearance Ur Clear Clear   Leukocytes,UA Negative Negative   Protein,UA Negative Negative/Trace   Glucose, UA Negative Negative   Ketones, UA Negative Negative   RBC, UA Negative Negative   Bilirubin, UA Negative Negative   Urobilinogen, Ur 0.2 0.2 - 1.0 mg/dL   Nitrite, UA Negative Negative  VITAMIN D 25 Hydroxy (Vit-D Deficiency, Fractures)  Result Value Ref Range   Vit D, 25-Hydroxy 31.8 30.0 - 100.0 ng/mL  B12 and Folate Panel  Result Value Ref Range   Vitamin B-12 236 232 - 1,245 pg/mL   Folate 3.4 >3.0 ng/mL      Assessment & Plan:   Problem List Items Addressed This Visit      Cardiovascular and  Mediastinum   Migraine without aura and without status migrainosus, not intractable - Primary    Working with ENT and neurology, but would like a 2nd opinion for neurology. Referral generated today. Await their input. Call with any concerns.       Relevant Medications   gabapentin (NEURONTIN) 100 MG capsule   Other Relevant Orders   Ambulatory referral to Neurology   Ambulatory referral to Neuropsychology     Respiratory   Moderate persistent asthma    Likely exacerbated by the humidity and heat. Continue her albuterol. Call if not getting better as the heat breaks and we'll do spiro.        Other   Insomnia    Had sleep study done on 7/24. Waiting on results. Treat as needed. Call with any concerns.       Vitamin B12 deficiency    Doing well off shots- continue oral. Call with any concerns.       Memory deficit    Very concerning to patient. Needs neuropsych testing- ordered today. Will get her 2nd opinion with neurology. Will continue to follow with psychiatry. Call with any concerns. Continue to monitor.  Relevant Orders   Ambulatory referral to Neurology   Ambulatory referral to Neuropsychology    Other Visit Diagnoses    Vertigo of central origin       Following with ENT- will get her into neurology for 2nd opinion. Await their input. Call with any concerns.    Relevant Orders   Ambulatory referral to Neurology   Ambulatory referral to Neuropsychology       Follow up plan: Return in about 4 weeks (around 05/24/2019).

## 2019-05-04 ENCOUNTER — Encounter: Payer: Self-pay | Admitting: Family Medicine

## 2019-05-05 ENCOUNTER — Other Ambulatory Visit: Payer: Self-pay | Admitting: Family Medicine

## 2019-05-05 ENCOUNTER — Telehealth: Payer: Self-pay | Admitting: Cardiology

## 2019-05-05 DIAGNOSIS — Z1231 Encounter for screening mammogram for malignant neoplasm of breast: Secondary | ICD-10-CM

## 2019-05-05 NOTE — Telephone Encounter (Signed)
° ° °  Scheduler called patient to follow up on MyChart request for appointment. The patients comment box indicated chest pain and under arm pain. Left message vcml to call office.

## 2019-05-05 NOTE — Telephone Encounter (Signed)
LMTCB

## 2019-05-06 ENCOUNTER — Ambulatory Visit: Payer: No Typology Code available for payment source | Admitting: Internal Medicine

## 2019-05-06 NOTE — Telephone Encounter (Signed)
lmtcb

## 2019-05-11 ENCOUNTER — Encounter: Payer: Self-pay | Admitting: Family Medicine

## 2019-05-11 ENCOUNTER — Ambulatory Visit (INDEPENDENT_AMBULATORY_CARE_PROVIDER_SITE_OTHER): Payer: No Typology Code available for payment source | Admitting: Family Medicine

## 2019-05-11 ENCOUNTER — Other Ambulatory Visit: Payer: Self-pay

## 2019-05-11 VITALS — BP 114/69 | HR 61 | Temp 97.2°F | Wt 189.5 lb

## 2019-05-11 DIAGNOSIS — H814 Vertigo of central origin: Secondary | ICD-10-CM

## 2019-05-11 NOTE — Progress Notes (Signed)
BP 114/69   Pulse 61   Temp (!) 97.2 F (36.2 C)   Wt 189 lb 8 oz (86 kg)   BMI 32.35 kg/m    Subjective:    Patient ID: Emma Rogers, female    DOB: 08-22-1966, 53 y.o.   MRN: [id number]  HPI: [name] is a 53 y.o. female  Chief Complaint  Patient presents with  . Dizziness    ENT added a new med for Meniere's- possible not 100% sure that is the issue, but was told that is was something defiently with her CNS. Showed Meniere's in one ear but not as high as usual.   . Central nerous system   Saw Dr. Richardson Landry on Thursday. Had some issues with her CNS per ENT. He wasn't sure it was meniere's or not. He started her on medication for meniere's. He advised her to see neurology. She has an appointment to see them at the end of September. She is on a cancellation list with them.   She notes that she is having a tremor again and some cramping in her legs. She continues with brain fog and dizziness. She notes that she was started on a triamterene-HCTZ and is a little anxious about taking that. In general she's just really worried. She is afraid that she is going to have something really bad going on. She is feeling OK- very anxious and still quite dizzy, but otherwise doing OK.  Relevant past medical, surgical, family and social history reviewed and updated as indicated. Interim medical history since our last visit reviewed. Allergies and medications reviewed and updated.  Review of Systems  Constitutional: Positive for fatigue. Negative for activity change, appetite change, chills, diaphoresis, fever and unexpected weight change.  HENT: Negative.   Respiratory: Negative.   Cardiovascular: Negative.   Genitourinary: Negative.   Musculoskeletal: Negative.   Skin: Negative.   Neurological: Positive for dizziness, tremors, weakness, light-headedness and headaches. Negative for seizures, syncope, facial asymmetry, speech difficulty and numbness.  Hematological: Negative.    Psychiatric/Behavioral: Positive for confusion, decreased concentration and dysphoric mood. Negative for agitation, behavioral problems, hallucinations, self-injury, sleep disturbance and suicidal ideas. The patient is nervous/anxious. The patient is not hyperactive.     Per HPI unless specifically indicated above     Objective:    BP 114/69   Pulse 61   Temp (!) 97.2 F (36.2 C)   Wt 189 lb 8 oz (86 kg)   BMI 32.35 kg/m   Wt Readings from Last 3 Encounters:  05/11/19 189 lb 8 oz (86 kg)  04/26/19 194 lb (88 kg)  03/04/19 185 lb (83.9 kg)    Physical Exam Vitals signs and nursing note reviewed.  Constitutional:      General: She is not in acute distress.    Appearance: Normal appearance. She is not ill-appearing, toxic-appearing or diaphoretic.  HENT:     Head: Normocephalic and atraumatic.     Right Ear: External ear normal.     Left Ear: External ear normal.     Nose: Nose normal.     Mouth/Throat:     Mouth: Mucous membranes are moist.     Pharynx: Oropharynx is clear.  Eyes:     General: No scleral icterus.       Right eye: No discharge.        Left eye: No discharge.     Conjunctiva/sclera: Conjunctivae normal.     Pupils: Pupils are equal, round, and reactive to light.  Neck:     Musculoskeletal: Normal range of motion.  Pulmonary:     Effort: Pulmonary effort is normal. No respiratory distress.     Comments: Speaking in full sentences Musculoskeletal: Normal range of motion.  Skin:    Coloration: Skin is not jaundiced or pale.     Findings: No bruising, erythema, lesion or rash.  Neurological:     Mental Status: She is alert and oriented to person, place, and time. Mental status is at baseline.  Psychiatric:        Mood and Affect: Mood normal.        Behavior: Behavior normal.        Thought Content: Thought content normal.        Judgment: Judgment normal.     Results for orders placed or performed in visit on 03/29/19  Bayer DCA Hb A1c Waived   Result Value Ref Range   HB A1C (BAYER DCA - WAIVED) 5.5 <7.0 %  CBC with Differential/Platelet  Result Value Ref Range   WBC 7.9 3.4 - 10.8 x10E3/uL   RBC 4.83 3.77 - 5.28 x10E6/uL   Hemoglobin 14.9 11.1 - 15.9 g/dL   Hematocrit 57.844.2 46.934.0 - 46.6 %   MCV 92 79 - 97 fL   MCH 30.8 26.6 - 33.0 pg   MCHC 33.7 31.5 - 35.7 g/dL   RDW 62.912.8 52.811.7 - 41.315.4 %   Platelets 273 150 - 450 x10E3/uL   Neutrophils 64 Not Estab. %   Lymphs 27 Not Estab. %   Monocytes 6 Not Estab. %   Eos 2 Not Estab. %   Basos 1 Not Estab. %   Neutrophils Absolute 5.1 1.4 - 7.0 x10E3/uL   Lymphocytes Absolute 2.2 0.7 - 3.1 x10E3/uL   Monocytes Absolute 0.4 0.1 - 0.9 x10E3/uL   EOS (ABSOLUTE) 0.2 0.0 - 0.4 x10E3/uL   Basophils Absolute 0.1 0.0 - 0.2 x10E3/uL   Immature Granulocytes 0 Not Estab. %   Immature Grans (Abs) 0.0 0.0 - 0.1 x10E3/uL  Comprehensive metabolic panel  Result Value Ref Range   Glucose 92 65 - 99 mg/dL   BUN 28 (H) 6 - 24 mg/dL   Creatinine, Ser 2.441.26 (H) 0.57 - 1.00 mg/dL   GFR calc non Af Amer 49 (L) >59 mL/min/1.73   GFR calc Af Amer 57 (L) >59 mL/min/1.73   BUN/Creatinine Ratio 22 9 - 23   Sodium 141 134 - 144 mmol/L   Potassium 3.8 3.5 - 5.2 mmol/L   Chloride 102 96 - 106 mmol/L   CO2 24 20 - 29 mmol/L   Calcium 9.5 8.7 - 10.2 mg/dL   Total Protein 7.4 6.0 - 8.5 g/dL   Albumin 4.8 3.8 - 4.9 g/dL   Globulin, Total 2.6 1.5 - 4.5 g/dL   Albumin/Globulin Ratio 1.8 1.2 - 2.2   Bilirubin Total 0.3 0.0 - 1.2 mg/dL   Alkaline Phosphatase 58 39 - 117 IU/L   AST 12 0 - 40 IU/L   ALT 9 0 - 32 IU/L  Lipid Panel w/o Chol/HDL Ratio  Result Value Ref Range   Cholesterol, Total 278 (H) 100 - 199 mg/dL   Triglycerides 010176 (H) 0 - 149 mg/dL   HDL 60 >27>39 mg/dL   VLDL Cholesterol Cal 35 5 - 40 mg/dL   LDL Calculated 253183 (H) 0 - 99 mg/dL  Microalbumin, Urine Waived  Result Value Ref Range   Microalb, Ur Waived 10 0 - 19 mg/L   Creatinine, Urine Waived 100  10 - 300 mg/dL   Microalb/Creat Ratio  <30 <30 mg/g  TSH  Result Value Ref Range   TSH 1.080 0.450 - 4.500 uIU/mL  UA/M w/rflx Culture, Routine   Specimen: Blood   BLD  Result Value Ref Range   Specific Gravity, UA >1.030 (H) 1.005 - 1.030   pH, UA 5.5 5.0 - 7.5   Color, UA Yellow Yellow   Appearance Ur Clear Clear   Leukocytes,UA Negative Negative   Protein,UA Negative Negative/Trace   Glucose, UA Negative Negative   Ketones, UA Negative Negative   RBC, UA Negative Negative   Bilirubin, UA Negative Negative   Urobilinogen, Ur 0.2 0.2 - 1.0 mg/dL   Nitrite, UA Negative Negative  VITAMIN D 25 Hydroxy (Vit-D Deficiency, Fractures)  Result Value Ref Range   Vit D, 25-Hydroxy 31.8 30.0 - 100.0 ng/mL  B12 and Folate Panel  Result Value Ref Range   Vitamin B-12 236 232 - 1,245 pg/mL   Folate 3.4 >3.0 ng/mL      Assessment & Plan:   Problem List Items Addressed This Visit    None    Visit Diagnoses    Vertigo of central origin    -  Primary   Reassured patient. Continue medication. Await neurology appointment. Call with any concerns.        Follow up plan: Return As scheduled.   . This visit was completed via FaceTime due to the restrictions of the COVID-19 pandemic. All issues as above were discussed and addressed. Physical exam was done as above through visual confirmation on FaceTime. If it was felt that the patient should be evaluated in the office, they were directed there. The patient verbally consented to this visit. . Location of the patient: parking lot . Location of the provider: home . Those involved with this call:  . Provider: Olevia PerchesMegan , DO . CMA: Tiffany Reel, CMA . Front Desk/Registration: Adela Portshristan Williamson  . Time spent on call: 15 minutes with patient face to face via video conference. More than 50% of this time was spent in counseling and coordination of care. 23 minutes total spent in review of patient's record and preparation of their chart.

## 2019-05-12 ENCOUNTER — Ambulatory Visit (INDEPENDENT_AMBULATORY_CARE_PROVIDER_SITE_OTHER): Payer: No Typology Code available for payment source | Admitting: Licensed Clinical Social Worker

## 2019-05-12 ENCOUNTER — Other Ambulatory Visit: Payer: Self-pay

## 2019-05-12 ENCOUNTER — Ambulatory Visit
Admission: RE | Admit: 2019-05-12 | Discharge: 2019-05-12 | Disposition: A | Discharge status: Home / Self Care | Payer: No Typology Code available for payment source | Source: Ambulatory Visit | Attending: Family Medicine | Admitting: Family Medicine

## 2019-05-12 ENCOUNTER — Encounter: Payer: Self-pay | Admitting: Licensed Clinical Social Worker

## 2019-05-12 DIAGNOSIS — F330 Major depressive disorder, recurrent, mild: Secondary | ICD-10-CM

## 2019-05-12 DIAGNOSIS — Z1231 Encounter for screening mammogram for malignant neoplasm of breast: Secondary | ICD-10-CM | POA: Diagnosis present

## 2019-05-12 DIAGNOSIS — F411 Generalized anxiety disorder: Secondary | ICD-10-CM

## 2019-05-12 NOTE — Progress Notes (Signed)
Virtual Visit via Video Note  I connected with Emma Rogers on 05/12/19 at  1:30 PM EDT by a video enabled telemedicine application and verified that I am speaking with the correct person using two identifiers.   I discussed the limitations of evaluation and management by telemedicine and the availability of in person appointments. The patient expressed understanding and agreed to proceed.   I discussed the assessment and treatment plan with the patient. The patient was provided an opportunity to ask questions and all were answered. The patient agreed with the plan and demonstrated an understanding of the instructions.   The patient was advised to call back or seek an in-person evaluation if the symptoms worsen or if the condition fails to improve as anticipated.  I provided 60 minutes of non-face-to-face time during this encounter.   Alden Hipp, LCSW    THERAPIST PROGRESS NOTE  Session Time: 1330  Participation Level: Active  Behavioral Response: CasualAlertAnxious  Type of Therapy: Individual Therapy  Treatment Goals addressed: Coping  Interventions: Supportive  Summary: Emma Rogers is a 53 y.o. female who presents with continued symptoms related to her diagnosis. Pema reports things have continued to escalate with her husband, and she has decided to leave him. She set a date for October first, and is moving into an apartment that her daughter owns. She was stressed about how to inform her husband she was leaving him. We discussed several options, but ultimately decided Yeraldine needed to protect herself and not discuss this with him until she is walking out the door. She reported, previously, he has been able to talk her out of leaving, which ended up where she is now. So, because of this, she decided it was okay to tell him as she walks out the door. LCSW confirmed this and expressed she has given him everything she can in the marriage, and she does not ow ehim anything  else. We utilized the remainder of the session to practice assertiveness and ways to manage her feelings int he moment moving forward.   Suicidal/Homicidal: No  Therapist Response: Merdis continues to work towards her tx goals but has not yet reached them. We will continue to work on emotional regulation and improving assertiveness.   Plan: Return again in 3 weeks.  Diagnosis: Axis I: Generalized Anxiety Disorder    Axis II: No diagnosis    Alden Hipp, LCSW 05/12/2019

## 2019-05-12 NOTE — Progress Notes (Signed)
1 

## 2019-05-16 ENCOUNTER — Telehealth: Payer: Self-pay | Admitting: Cardiology

## 2019-05-16 NOTE — Telephone Encounter (Signed)
New message   Pt c/o BP issue: STAT if pt c/o blurred vision, one-sided weakness or slurred speech  1. What are your last 5 BP readings? 103/62, 86/36, 100/58  2. Are you having any other symptoms (ex. Dizziness, headache, blurred vision, passed out)? dizziness  3. What is your BP issue? Low BP

## 2019-05-17 ENCOUNTER — Encounter: Payer: Self-pay | Admitting: Family Medicine

## 2019-05-24 ENCOUNTER — Ambulatory Visit: Payer: No Typology Code available for payment source | Admitting: Family Medicine

## 2019-05-25 ENCOUNTER — Ambulatory Visit: Payer: No Typology Code available for payment source | Admitting: *Deleted

## 2019-05-26 ENCOUNTER — Other Ambulatory Visit: Payer: Self-pay

## 2019-05-26 ENCOUNTER — Ambulatory Visit (INDEPENDENT_AMBULATORY_CARE_PROVIDER_SITE_OTHER): Payer: No Typology Code available for payment source | Admitting: Psychiatry

## 2019-05-26 ENCOUNTER — Telehealth: Payer: Self-pay

## 2019-05-26 ENCOUNTER — Other Ambulatory Visit: Payer: Self-pay | Admitting: Psychiatry

## 2019-05-26 ENCOUNTER — Encounter: Payer: Self-pay | Admitting: Psychiatry

## 2019-05-26 DIAGNOSIS — F331 Major depressive disorder, recurrent, moderate: Secondary | ICD-10-CM | POA: Diagnosis not present

## 2019-05-26 DIAGNOSIS — F330 Major depressive disorder, recurrent, mild: Secondary | ICD-10-CM | POA: Insufficient documentation

## 2019-05-26 DIAGNOSIS — F411 Generalized anxiety disorder: Secondary | ICD-10-CM

## 2019-05-26 DIAGNOSIS — G4701 Insomnia due to medical condition: Secondary | ICD-10-CM

## 2019-05-26 LAB — CUP PACEART REMOTE DEVICE CHECK
Battery Impedance: 539 Ohm
Battery Remaining Longevity: 78 mo
Battery Voltage: 2.78 V
Brady Statistic AP VP Percent: 1 %
Brady Statistic AP VS Percent: 41 %
Brady Statistic AS VP Percent: 0 %
Brady Statistic AS VS Percent: 57 %
Date Time Interrogation Session: 20200902133449
Implantable Lead Implant Date: 19960806
Implantable Lead Implant Date: 19960806
Implantable Lead Location: 753859
Implantable Lead Location: 753860
Implantable Lead Model: 4524
Implantable Lead Model: 5034
Implantable Pulse Generator Implant Date: 20110328
Lead Channel Impedance Value: 1078 Ohm
Lead Channel Impedance Value: 243 Ohm
Lead Channel Pacing Threshold Amplitude: 1.5 V
Lead Channel Pacing Threshold Amplitude: 1.5 V
Lead Channel Pacing Threshold Pulse Width: 0.4 ms
Lead Channel Pacing Threshold Pulse Width: 0.4 ms
Lead Channel Setting Pacing Amplitude: 3 V
Lead Channel Setting Pacing Amplitude: 3 V
Lead Channel Setting Pacing Pulse Width: 0.4 ms
Lead Channel Setting Sensing Sensitivity: 2 mV

## 2019-05-26 MED ORDER — TRAZODONE HCL 100 MG PO TABS: 200.0000 mg | ORAL_TABLET | Freq: Every evening | ORAL | 0 refills | Status: DC | PRN

## 2019-05-26 MED ORDER — BUPROPION HCL ER (XL) 300 MG PO TB24: 300.0000 mg | ORAL_TABLET | Freq: Every day | ORAL | 0 refills | Status: DC

## 2019-05-26 MED ORDER — SERTRALINE HCL 100 MG PO TABS: 100.0000 mg | ORAL_TABLET | Freq: Every day | ORAL | 0 refills | Status: DC
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
## 2019-05-26 NOTE — Telephone Encounter (Signed)
pt called states that [employer] states that you would have to start her out of work as of 05-24-19.  she states if you have any questions to call her she can explain in more detail.  her work is suppose to fax over paperwork

## 2019-05-26 NOTE — Telephone Encounter (Signed)
OK WILL DO

## 2019-06-01 NOTE — Telephone Encounter (Signed)
Completed FMLA form.

## 2019-06-07 ENCOUNTER — Other Ambulatory Visit: Payer: Self-pay

## 2019-06-07 ENCOUNTER — Ambulatory Visit (INDEPENDENT_AMBULATORY_CARE_PROVIDER_SITE_OTHER): Payer: No Typology Code available for payment source | Admitting: Licensed Clinical Social Worker

## 2019-06-07 ENCOUNTER — Telehealth (HOSPITAL_COMMUNITY): Payer: Self-pay | Admitting: Psychiatry

## 2019-06-07 ENCOUNTER — Encounter: Payer: Self-pay | Admitting: Psychiatry

## 2019-06-07 ENCOUNTER — Encounter: Payer: Self-pay | Admitting: Licensed Clinical Social Worker

## 2019-06-07 ENCOUNTER — Ambulatory Visit (INDEPENDENT_AMBULATORY_CARE_PROVIDER_SITE_OTHER): Payer: No Typology Code available for payment source | Admitting: Psychiatry

## 2019-06-07 DIAGNOSIS — F331 Major depressive disorder, recurrent, moderate: Secondary | ICD-10-CM

## 2019-06-07 DIAGNOSIS — F411 Generalized anxiety disorder: Secondary | ICD-10-CM

## 2019-06-07 DIAGNOSIS — G4701 Insomnia due to medical condition: Secondary | ICD-10-CM

## 2019-06-07 MED ORDER — TRAZODONE HCL 100 MG PO TABS: 50.0000 mg | ORAL_TABLET | Freq: Every evening | ORAL | 0 refills | Status: DC | PRN

## 2019-06-07 MED ORDER — QUETIAPINE FUMARATE 25 MG PO TABS: 25.0000 mg | ORAL_TABLET | Freq: Every day | ORAL | 1 refills | Status: DC
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
## 2019-06-07 NOTE — Telephone Encounter (Signed)
D:  Dr. Shea Evans referred pt to [facility].  A:  Placed call to orient pt and provide her with a start date.  Left case manager's phone number.  Inform Dr. Shea Evans.

## 2019-06-07 NOTE — Patient Instructions (Signed)
Quetiapine tablets What is this medicine? QUETIAPINE (kwe TYE a peen) is an antipsychotic. It is used to treat schizophrenia and bipolar disorder, also known as manic-depression. This medicine may be used for other purposes; ask your health care provider or pharmacist if you have questions. COMMON BRAND NAME(S): Seroquel What should I tell my health care provider before I take this medicine? They need to know if you have any of these conditions:  blockage in your bowel  cataracts  constipation  dehydration  diabetes  difficulty swallowing  glaucoma  heart disease  history of breast cancer  kidney disease  liver disease  low blood counts, like low white cell, platelet, or red cell counts  low blood pressure or dizziness when standing up  Parkinson's disease  previous heart attack  prostate disease  seizures  stomach or intestine problems  suicidal thoughts, plans or attempt; a previous suicide attempt by you or a family member  thyroid disease  trouble passing urine  an unusual or allergic reaction to quetiapine, other medicines, foods, dyes, or preservatives  pregnant or trying to get pregnant  breast-feeding How should I use this medicine? Take this medicine by mouth. Swallow it with a drink of water. Follow the directions on the prescription label. If it upsets your stomach you can take it with food. Take your medicine at regular intervals. Do not take it more often than directed. Do not stop taking except on the advice of your doctor or health care professional. A special MedGuide will be given to you by the pharmacist with each prescription and refill. Be sure to read this information carefully each time. Talk to your pediatrician regarding the use of this medicine in children. While this drug may be prescribed for children as young as 10 years for selected conditions, precautions do apply. Patients over age 65 years may have a stronger reaction to this  medicine and need smaller doses. Overdosage: If you think you have taken too much of this medicine contact a poison control center or emergency room at once. NOTE: This medicine is only for you. Do not share this medicine with others. What if I miss a dose? If you miss a dose, take it as soon as you can. If it is almost time for your next dose, take only that dose. Do not take double or extra doses. What may interact with this medicine? Do not take this medicine with any of the following medications:  cisapride  dronedarone  fluconazole  metoclopramide  pimozide  posaconazole  thioridazine This medicine may also interact with the following medications:  alcohol  antihistamines for allergy cough and cold  antiviral medicines for HIV or AIDS  atropine  certain medicines for bladder problems like oxybutynin, tolterodine  certain medicines for blood pressure  certain medicines for depression, anxiety, or psychotic disturbances  certain medicines for diabetes  certain medicines for stomach problems like dicyclomine, hyoscyamine  certain medicines for travel sickness like scopolamine  certain medicines for Parkinson's disease  certain medicines for seizures like carbamazepine, phenobarbital, phenytoin  cimetidine  erythromycin  ipratropium  other medicines that prolong the QT interval (cause an abnormal heart rhythm) like dofetilide  rifampin  steroid medicines like prednisone or cortisone This list may not describe all possible interactions. Give your health care provider a list of all the medicines, herbs, non-prescription drugs, or dietary supplements you use. Also tell them if you smoke, drink alcohol, or use illegal drugs. Some items may interact with your medicine. What   should I watch for while using this medicine? Visit your doctor or health care professional for regular checks on your progress. It may be several weeks before you see the full effects of  this medicine. Your health care provider may suggest that you have your eyes examined prior to starting this medicine, and every 6 months thereafter. If you have been taking this medicine regularly for some time, do not suddenly stop taking it. You must gradually reduce the dose or your symptoms may get worse. Ask your doctor or health care professional for advice. This medicine may increase blood sugar. Ask your healthcare provider if changes in diet or medicines are needed if you have diabetes. Patients and their families should watch out for worsening depression or thoughts of suicide. Also watch out for sudden or severe changes in feelings such as feeling anxious, agitated, panicky, irritable, hostile, aggressive, impulsive, severely restless, overly excited and hyperactive, or not being able to sleep. If this happens, especially at the beginning of antidepressant treatment or after a change in dose, call your health care professional. You may get dizzy or drowsy. Do not drive, use machinery, or do anything that needs mental alertness until you know how this medicine affects you. Do not stand or sit up quickly, especially if you are an older patient. This reduces the risk of dizzy or fainting spells. Alcohol can increase dizziness and drowsiness. Avoid alcoholic drinks. Do not treat yourself for colds, diarrhea or allergies. Ask your doctor or health care professional for advice, some ingredients may increase possible side effects. This medicine can reduce the response of your body to heat or cold. Dress warm in cold weather and stay hydrated in hot weather. If possible, avoid extreme temperatures like saunas, hot tubs, very hot or cold showers, or activities that can cause dehydration such as vigorous exercise. What side effects may I notice from receiving this medicine? Side effects that you should report to your doctor or health care professional as soon as possible:  allergic reactions like skin  rash, itching or hives, swelling of the face, lips, or tongue  changes in vision  difficulty swallowing  elevated mood, decreased need for sleep, racing thoughts, impulsive behavior  eye pain  redness, blistering, peeling, or loosening of the skin, including inside the mouth  restlessness, pacing, inability to keep still  seizures  signs and symptoms of a dangerous change in heartbeat or heart rhythm like chest pain; dizziness; fast, irregular heartbeat; palpitations; feeling faint or lightheaded; falls; breathing problems  signs and symptoms of high blood sugar such as being more thirsty or hungry or having to urinate more than normal. You may also feel very tired or have blurry vision.  signs and symptoms of hypothyroidism like fatigue; increased sensitivity to cold; weight gain; hoarseness; thinning hair  signs and symptoms of infection like fever; chills; cough; sore throat; pain or trouble passing urine  signs and symptoms of low blood pressure like dizziness; feeling faint or lightheaded; falls; unusually weak or tired  signs and symptoms of neuroleptic malignant syndrome (NMS) like confusion; fast, irregular heartbeat; high fever; increased sweating; stiff muscles  signs and symptoms of a stroke like changes in vision; confusion; trouble speaking or understanding; severe headaches; sudden numbness or weakness of the face, arm or leg; trouble walking; dizziness; loss of balance or coordination  signs and symptoms of tardive dyskinesia, like uncontrollable head, mouth, neck, arm, or leg movements  suicidal thoughts, mood changes Side effects that usually do not require   medical attention (report to your doctor or health care professional if they continue or are bothersome):  change in sex drive or performance  constipation  drowsiness  dry mouth  upset stomach  weight gain This list may not describe all possible side effects. Call your doctor for medical advice about  side effects. You may report side effects to FDA at 1-800-FDA-1088. Where should I keep my medicine? Keep out of the reach of children. Store at room temperature between 15 and 30 degrees C (59 and 86 degrees F). Throw away any unused medicine after the expiration date. NOTE: This sheet is a summary. It may not cover all possible information. If you have questions about this medicine, talk to your doctor, pharmacist, or health care provider.  2020 Elsevier/Gold Standard (2018-08-31 08:55:27)  

## 2019-06-07 NOTE — Progress Notes (Signed)
Virtual Visit via Video Note  I connected with Emma Rogers on 06/07/19 at  8:00 AM EDT by a video enabled telemedicine application and verified that I am speaking with the correct person using two identifiers.   I discussed the limitations of evaluation and management by telemedicine and the availability of in person appointments. The patient expressed understanding and agreed to proceed.  I discussed the assessment and treatment plan with the patient. The patient was provided an opportunity to ask questions and all were answered. The patient agreed with the plan and demonstrated an understanding of the instructions.   The patient was advised to call back or seek an in-person evaluation if the symptoms worsen or if the condition fails to improve as anticipated.  I provided 60  minutes of non-face-to-face time during this encounter.   Alden Hipp, LCSW    THERAPIST PROGRESS NOTE  Session Time: 0800  Participation Level: Active  Behavioral Response: NeatAlertDepressed  Type of Therapy: Individual Therapy  Treatment Goals addressed: Coping  Interventions: Solution Focused and Supportive  Summary: Emma Rogers is a 53 y.o. female who presents with continued symptoms related to her diagnosis. Kenlynn reports doing "not great," since our last session. She reports having a "complete break down," at work when a co-worker asked, "what I thought was a dumb question." Azyria noted she just started bawling at work, and was not able to control her emotions. Her coworker then suggested she take a leave of absence, which she is currently doing. She reports she has no motivation or energy to do anything, and is very depressed. She states she still plans to leave her husband on 06/23/19, but is feeling overwhelmed by the repairs she needs to complete in the mobile home she's moving into. LCSW validated and normalized these feelings, and encouraged Faithanne to make a list of the things that HAVE to  be done before she is able to spend one night in the home, and then break that down into smaller goals. We reviewed how to set SMART goals in order to not get overwhelmed or feel overly anxious throughout the process. Ashaya expressed understanding and agreement with this information. Jocabed reported she is having a difficult time managing her feelings and knowing if what she feels is correct. LCSW validated these feelings and highlighted how her marriage has impacted her sense of self. LCSW encouraged Jashanti to trust her gut, and to start paying attention to how often she second guesses herself. Pallavi expressed understanding and agreement. We discussed having sessions more frequently through this process as well.   Suicidal/Homicidal: No  Therapist Response: Felesha continues to work towards her tx goals but has not yet reached them. We will continue to work on building self-esteem and improving emotional regulation skills moving forward.   Plan: Return again in 1 weeks.  Diagnosis: Axis I: Generalized Anxiety Disorder and MDD    Axis II: No diagnosis    Alden Hipp, LCSW 06/07/2019

## 2019-06-07 NOTE — Telephone Encounter (Signed)
D:  Pt called writer back.  A:  Oriented pt provided pt with start date of 06-13-19; per pt's request.  Inform Dr. Shea Evans and Alden Hipp, LCSW.  R:  Pt receptive.

## 2019-06-09 ENCOUNTER — Telehealth: Payer: Self-pay

## 2019-06-09 NOTE — Telephone Encounter (Signed)
Please let her know letter was mailed . If she wants it emailed, please assist.Thanks

## 2019-06-09 NOTE — Telephone Encounter (Signed)
pt checking on letter you was going to email to her. pt states she still has not received. it was about matrix. and extening her time off

## 2019-06-10 ENCOUNTER — Ambulatory Visit: Payer: No Typology Code available for payment source | Admitting: Psychiatry

## 2019-06-13 ENCOUNTER — Telehealth (HOSPITAL_COMMUNITY): Payer: Self-pay | Admitting: Psychiatry

## 2019-06-13 NOTE — Telephone Encounter (Signed)
D:  Pt left writer a vm re: starting MH-IOP today.  According to pt, she had various things that have come up and she's thinking of doing group at a later time.  She said she verified her benefits and was told she would be responsible for paying 20% for each visit.  "I am already concerned about my finances since I am moving out on my own.  I am already struggling with my bills.  I worried all weekend.  I also have two doctor's appts this week and my internet is messing up."  Pt states it's just not a good time, but may do it at a later date.  Pt asked for recommendations.  A:  Returned pt's call.  Provided pt with support.  Left vm informing pt about [facility] thru [place]; which is free.  Also, strongly recommended a separation/divorce recovery group.  Will inform Dr. Shea Evans.

## 2019-06-15 ENCOUNTER — Encounter: Payer: Self-pay | Admitting: Licensed Clinical Social Worker

## 2019-06-15 ENCOUNTER — Other Ambulatory Visit: Payer: Self-pay

## 2019-06-15 ENCOUNTER — Ambulatory Visit (INDEPENDENT_AMBULATORY_CARE_PROVIDER_SITE_OTHER): Payer: No Typology Code available for payment source | Admitting: Licensed Clinical Social Worker

## 2019-06-15 DIAGNOSIS — F331 Major depressive disorder, recurrent, moderate: Secondary | ICD-10-CM | POA: Diagnosis not present
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
## 2019-06-16 ENCOUNTER — Ambulatory Visit: Payer: No Typology Code available for payment source | Admitting: Neurology

## 2019-06-16 ENCOUNTER — Encounter: Payer: Self-pay | Admitting: Neurology

## 2019-06-16 VITALS — BP 105/70 | HR 59 | Temp 97.3°F | Ht 65.0 in | Wt 194.5 lb

## 2019-06-16 DIAGNOSIS — R42 Dizziness and giddiness: Secondary | ICD-10-CM

## 2019-06-16 DIAGNOSIS — F329 Major depressive disorder, single episode, unspecified: Secondary | ICD-10-CM

## 2019-06-16 DIAGNOSIS — G43009 Migraine without aura, not intractable, without status migrainosus: Secondary | ICD-10-CM

## 2019-06-16 DIAGNOSIS — R413 Other amnesia: Secondary | ICD-10-CM | POA: Diagnosis not present

## 2019-06-16 DIAGNOSIS — G4701 Insomnia due to medical condition: Secondary | ICD-10-CM

## 2019-06-16 DIAGNOSIS — F32A Depression, unspecified: Secondary | ICD-10-CM

## 2019-06-16 DIAGNOSIS — R29898 Other symptoms and signs involving the musculoskeletal system: Secondary | ICD-10-CM | POA: Diagnosis not present
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
## 2019-06-24 ENCOUNTER — Ambulatory Visit (INDEPENDENT_AMBULATORY_CARE_PROVIDER_SITE_OTHER): Payer: No Typology Code available for payment source | Admitting: Psychiatry

## 2019-06-24 ENCOUNTER — Encounter: Payer: Self-pay | Admitting: Psychiatry

## 2019-06-24 ENCOUNTER — Other Ambulatory Visit: Payer: Self-pay

## 2019-06-24 DIAGNOSIS — G4701 Insomnia due to medical condition: Secondary | ICD-10-CM | POA: Diagnosis not present

## 2019-06-24 DIAGNOSIS — F331 Major depressive disorder, recurrent, moderate: Secondary | ICD-10-CM

## 2019-06-24 DIAGNOSIS — F411 Generalized anxiety disorder: Secondary | ICD-10-CM
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
## 2019-06-30 ENCOUNTER — Encounter: Payer: Self-pay | Admitting: Licensed Clinical Social Worker

## 2019-06-30 ENCOUNTER — Other Ambulatory Visit: Payer: Self-pay

## 2019-06-30 ENCOUNTER — Ambulatory Visit (INDEPENDENT_AMBULATORY_CARE_PROVIDER_SITE_OTHER): Payer: No Typology Code available for payment source | Admitting: Licensed Clinical Social Worker

## 2019-06-30 DIAGNOSIS — F411 Generalized anxiety disorder: Secondary | ICD-10-CM | POA: Diagnosis not present

## 2019-06-30 DIAGNOSIS — F331 Major depressive disorder, recurrent, moderate: Secondary | ICD-10-CM

## 2019-06-30 NOTE — Progress Notes (Signed)
  Virtual Visit via Telephone Note  I connected with Emma Rogers on 06/30/19 at  8:00 AM EDT by telephone and verified that I am speaking with the correct person using two identifiers.   I discussed the limitations, risks, security and privacy concerns of performing an evaluation and management service by telephone and the availability of in person appointments. I also discussed with the patient that there may be a patient responsible charge related to this service. The patient expressed understanding and agreed to proceed.   I discussed the assessment and treatment plan with the patient. The patient was provided an opportunity to ask questions and all were answered. The patient agreed with the plan and demonstrated an understanding of the instructions.   The patient was advised to call back or seek an in-person evaluation if the symptoms worsen or if the condition fails to improve as anticipated.  I provided 60 minutes of non-face-to-face time during this encounter.   Alden Hipp, LCSW   THERAPIST PROGRESS NOTE  Session Time: 0800  Participation Level: Active  Behavioral Response: Well GroomedAlertHappy  Type of Therapy: Individual Therapy  Treatment Goals addressed: Coping  Interventions: Supportive  Summary: Emma Rogers is a 53 y.o. female who presents with continued symptoms related to her diagnosis. Emma Rogers reports doing well since our last session. She reports her mood has drastically improved since leaving her husband. She reports feeling more at peace, and has been able to return to work. LCSW validated Emma Rogers's feelings, and highlighted that her change in mood could be heard through her voice during today's session. Emma Rogers reported she left her husband two weeks ago, and since then has been doing much better. She reports her primary stressor now is her husband's constant communication and asking her when she was coming home. Her husband proposed going to marriage  counseling, leaving their money together, and having date nights. LCSW asked Emma Rogers, in her gut, what she wanted regarding those three things. She reported she just wants out of the marriage, and does not want to work on it. LCSW validated those feelings and highlighted the many ways Emma Rogers has already attempted to work on her marriage and that her husband was not left in the dark about how his behaviors impacted her. We discussed ways to listen to her inner voice and drown out the other people in her life telling her how she "should" be behaving. Emma Rogers was able to understand these ideas and information presented, and stated she needs to set more boundaries. We discussed where her "people pleasing," comes from and discussed ways she could be more assertive moving forward.   Suicidal/Homicidal: No  Therapist Response: Emma Rogers continues to work towards her tx goals but has not yet reached them. We will continue to work on improving communication and assertiveness.   Plan: Return again in 2 weeks.  Diagnosis: Axis I: Generalized Anxiety Disorder    Axis II: No diagnosis    Alden Hipp, LCSW 06/30/2019

## 2019-07-06 ENCOUNTER — Telehealth: Payer: Self-pay | Admitting: Cardiology

## 2019-07-06 NOTE — Telephone Encounter (Signed)
Patient called office to report intermittent palpitations and "fluttering" in her chest throughout the day. No CP, no SOB, no dizziness, no syncope. Unable to send remote transmission at this time. Will send transmission when she gets home from work. ED precautions given.

## 2019-07-06 NOTE — Telephone Encounter (Signed)
New Message:    Please call, having some problems.

## 2019-07-07 NOTE — Telephone Encounter (Signed)
Transmission received 07/07/19 at 07:49. Presenting rhythm AS/VS @ 70bpm. 4,425 mode switches since 11/23/18 (0.3% burden), most recent episodes on 07/01/19 show 1:1 ST vs AT, longest 71min 33sec. Occasional atrial markers non-physiologic, suggesting RA oversensing/noise (previously noted, RA lead is programmed unipolar pace/sense, low impedance, sensitivity at 0.76mV).  Spoke with patient. She reports palpitations are improved today. Lasted a few seconds to a few minutes at a time yesterday. Reports she sometimes noted light "pressure" in her chest, similar to when we run RV threshold testing in clinic. Explained to pt that she V-paces minimally (1.3%), PPM will not record EGMs of V-pacing. Pt is overdue for 67m f/u with Dr. Curt Bears. She is agreeable to scheduling this visit. Encouraged to call back with any further questions or concerns in the interim. Pt verbalizes understanding.  Encounter routed to [facility] to schedule visit.

## 2019-07-12 ENCOUNTER — Encounter: Payer: Self-pay | Admitting: Licensed Clinical Social Worker

## 2019-07-12 ENCOUNTER — Other Ambulatory Visit: Payer: Self-pay

## 2019-07-12 ENCOUNTER — Ambulatory Visit (INDEPENDENT_AMBULATORY_CARE_PROVIDER_SITE_OTHER): Payer: No Typology Code available for payment source | Admitting: Licensed Clinical Social Worker

## 2019-07-12 DIAGNOSIS — F411 Generalized anxiety disorder: Secondary | ICD-10-CM

## 2019-07-12 DIAGNOSIS — F331 Major depressive disorder, recurrent, moderate: Secondary | ICD-10-CM
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
## 2019-07-26 ENCOUNTER — Encounter: Payer: Self-pay | Admitting: Cardiology

## 2019-07-26 ENCOUNTER — Ambulatory Visit (INDEPENDENT_AMBULATORY_CARE_PROVIDER_SITE_OTHER): Payer: No Typology Code available for payment source | Admitting: Psychiatry

## 2019-07-26 ENCOUNTER — Ambulatory Visit (INDEPENDENT_AMBULATORY_CARE_PROVIDER_SITE_OTHER): Payer: No Typology Code available for payment source | Admitting: Cardiology

## 2019-07-26 ENCOUNTER — Other Ambulatory Visit: Payer: Self-pay

## 2019-07-26 ENCOUNTER — Encounter: Payer: Self-pay | Admitting: Psychiatry

## 2019-07-26 VITALS — BP 122/88 | HR 73 | Ht 65.0 in | Wt 199.0 lb

## 2019-07-26 DIAGNOSIS — F3341 Major depressive disorder, recurrent, in partial remission: Secondary | ICD-10-CM | POA: Diagnosis not present

## 2019-07-26 DIAGNOSIS — F411 Generalized anxiety disorder: Secondary | ICD-10-CM | POA: Diagnosis not present

## 2019-07-26 DIAGNOSIS — R002 Palpitations: Secondary | ICD-10-CM | POA: Diagnosis not present

## 2019-07-26 DIAGNOSIS — G4701 Insomnia due to medical condition: Secondary | ICD-10-CM | POA: Diagnosis not present

## 2019-07-26 MED ORDER — QUETIAPINE FUMARATE 25 MG PO TABS: 25.0000 mg | ORAL_TABLET | Freq: Every day | ORAL | 1 refills | Status: DC

## 2019-07-26 NOTE — Progress Notes (Signed)
Electrophysiology Office Note   Date:  07/26/2019   ID:  Jeanell, Mangan 14-Jul-1966, MRN [id number]  PCP:  Dorcas Carrow, DO Primary Electrophysiologist:  Regan Lemming, MD    No chief complaint on file.    History of Present Illness: Emma Rogers is a 53 y.o. female who presents today for electrophysiology evaluation.   She is feeling well today without major complaint. That being said, she does have some chest pain. The pain occurs both at rest and with exertion. There is no exacerbating or alleviating factor. It is associated with mild shortness of breath. She feels that the chest pain is likely due to asthma. Her pain has not changed since last being seen. Her palpitations also continue. There is no exacerbating or alleviating factor.   Today, denies symptoms of palpitations, chest pain, shortness of breath, orthopnea, PND, lower extremity edema, claudication, dizziness, presyncope, syncope, bleeding, or neurologic sequela. The patient is tolerating medications without difficulties.  Continued to have episodic palpitations.  Her palpitations occur on a daily basis sometimes.  They last a few minutes at a time.  Device interrogation shows multiple episodes of ventricular pacing, which she certainly felt during pacemaker testing.  She does say that her feelings are a little bit different from that.  Past Medical History:  Diagnosis Date  . Abnormal MRI 04/03/2019  . Allergic rhinitis   . Anxiety   . Asthma   . Depression   . Headache(784.0)   . Renal insufficiency    Stage 3 kidney disease.   . Shingles    Past Surgical History:  Procedure Laterality Date  . ABDOMINAL HYSTERECTOMY  2009   still has 1 ovary  . BREAST BIOPSY Left 09/30/2002   Ductogram-neg  . INSERT / REPLACE / REMOVE PACEMAKER     Permanent pacemaker, Dr. [name]  . Lobe hemithroidectomy     Right     Current Outpatient Medications  Medication Sig Dispense Refill  . Albuterol Sulfate  (PROAIR RESPICLICK) 108 (90 Base) MCG/ACT AEPB Inhale 1-2 puffs into the lungs every 4 (four) hours as needed. 1 each 2  . atorvastatin (LIPITOR) 40 MG tablet Take 1 tablet (40 mg total) by mouth at bedtime. 90 tablet 1  . budesonide-formoterol (SYMBICORT) 80-4.5 MCG/ACT inhaler Inhale 2 puffs into the lungs 2 (two) times daily. 1 Inhaler 12  . buPROPion (WELLBUTRIN XL) 300 MG 24 hr tablet Take 1 tablet (300 mg total) by mouth daily. 90 tablet 0  . Cyanocobalamin 1000 MCG CAPS Take 1 capsule by mouth daily. 30 capsule 12  . rizatriptan (MAXALT) 10 MG tablet Take 1 tablet (10 mg total) by mouth as needed for migraine. May repeat in 2 hours if needed 10 tablet 1  . sertraline (ZOLOFT) 100 MG tablet Take 1 tablet (100 mg total) by mouth daily. 90 tablet 0  . traZODone (DESYREL) 100 MG tablet Take 0.5 tablets (50 mg total) by mouth at bedtime as needed. for sleep 90 tablet 0  . VITAMIN D PO Take 1 tablet by mouth daily.     No current facility-administered medications for this visit.     Allergies:   Amoxicillin, Clavulanic acid, and Morphine and related   Social History:  The patient  reports that she has never smoked. She has never used smokeless tobacco. She reports that she does not drink alcohol or use drugs.   Family History:  The patient's family history includes Asthma in an other family member; Breast cancer  in her maternal grandmother; COPD in her maternal grandmother; Coronary artery disease in an other family member; Heart disease in her father, maternal grandfather, and paternal grandfather; Liver disease in her paternal grandmother; Lung cancer in an other family member; Lung disease in her father; Prostate cancer in an other family member.    ROS:  Please see the history of present illness.   Otherwise, review of systems is positive for none.   All other systems are reviewed and negative.   PHYSICAL EXAM: VS:  BP 122/88   Pulse 73   Ht 5\' 5"  (1.651 m)   Wt 199 lb (90.3 kg)    SpO2 95%   BMI 33.12 kg/m  , BMI Body mass index is 33.12 kg/m. GEN: Well nourished, well developed, in no acute distress  HEENT: normal  Neck: no JVD, carotid bruits, or masses Cardiac: RRR; no murmurs, rubs, or gallops,no edema  Respiratory:  clear to auscultation bilaterally, normal work of breathing GI: soft, nontender, nondistended, + BS MS: no deformity or atrophy  Skin: warm and dry, device site well healed Neuro:  Strength and sensation are intact Psych: euthymic mood, full affect  EKG:  EKG is ordered today. Personal review of the ekg ordered shows sinus rhythm rate 73  Personal review of the device interrogation today. Results in [facility]: 03/01/2019: Magnesium 1.9 03/29/2019: ALT 9; BUN 28; Creatinine, Ser 1.26; Hemoglobin 14.9; Platelets 273; Potassium 3.8; Sodium 141; TSH 1.080    Lipid Panel     Component Value Date/Time   CHOL 278 (H) 03/29/2019 1457   TRIG 176 (H) 03/29/2019 1457   HDL 60 03/29/2019 1457   CHOLHDL 3.0 01/12/2019 1327   CHOLHDL 4.3 11/01/2018 1156   VLDL 12.8 02/13/2012 1039   LDLCALC 183 (H) 03/29/2019 1457   LDLCALC 162 (H) 11/01/2018 1156   LDLDIRECT 178.3 02/13/2012 1039     Wt Readings from Last 3 Encounters:  07/26/19 199 lb (90.3 kg)  06/16/19 194 lb 8 oz (88.2 kg)  05/11/19 189 lb 8 oz (86 kg)      Other studies Reviewed: Additional studies/ records that were reviewed today include: TTE 01/11/18 - Left ventricle: The cavity size was normal. Systolic function was   normal. The estimated ejection fraction was in the range of 60%   to 65%. Wall motion was normal; there were no regional wall   motion abnormalities. Left ventricular diastolic function   parameters were normal. - Left atrium: The atrium was normal in size. - Right ventricle: Pacer wire or catheter noted in right ventricle.   Systolic function was normal. - Pulmonary arteries: Systolic pressure was within the normal   range.   ASSESSMENT AND PLAN:   1.  Dual-chamber pacemaker: Status post Medtronic dual-chamber pacemaker implanted in 19 6.  Does have some noise and a low impedance on the atrial lead.   She is atrial pacing up to 40% of the time.  She is having palpitations.  It does look like she is having some PMT and thus we have increased her PVARP.  We have also changed her atrial sensitivity as she does have noise on her atrial lead.  2. Chest pain: Remains atypical.  Pain is lasted up to a few hours.  Likely noncardiac.  3. Palpitations: Has had a one-to-one tachycardia, though it is unclear if this is due to sinus tach versus atrial tachycardia.  This is around the time of her father's passing.  It is unclear to  me what she is symptomatic from.  We Norbert Malkin thus place a 14-day monitor to see if we can find a cause for her palpitations.  Current medicines are reviewed at length with the patient today.   The patient does not have concerns regarding her medicines.  The following changes were made today: None  Labs/ tests ordered today include:  Orders Placed This Encounter  Procedures  . LONG TERM MONITOR (3-14 DAYS)  . EKG 12-Lead    Disposition:   FU with Emma Rogers 12 months  Signed, Emma Rogers Jorja LoaMartin Deloris Moger, MD  07/26/2019 4:35 PM     [facility] [address] [place] [address] [phone] (office) [phone] (fax)
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
## 2019-07-26 NOTE — Patient Instructions (Addendum)
Medication Instructions:  Your physician recommends that you continue on your current medications as directed. Please refer to the Current Medication list given to you today.  * If you need a refill on your cardiac medications before your next appointment, please call your pharmacy.   Labwork: None ordered  Testing/Procedures: Your physician has recommended that you wear a 14 day holter monitor. Holter monitors are medical devices that record the heart's electrical activity. Doctors most often use these monitors to diagnose arrhythmias. Arrhythmias are problems with the speed or rhythm of the heartbeat. The monitor is a small, portable device. You can wear one while you do your normal daily activities. This is usually used to diagnose what is causing palpitations/syncope (passing out).  Follow-Up: Follow up to be determined after you complete monitor  Thank you for choosing [facility]!!   Dory Horn, RN [phone]  Any Other Special Instructions Will Be Listed Below (If Applicable).  Ambulatory Cardiac Monitoring An ambulatory cardiac monitor is a small recording device that is used to detect abnormal heart rhythms (arrhythmias). Most monitors are connected by wires to flat, sticky disks (electrodes) that are then attached to your chest. You may need to wear a monitor if you have had symptoms such as:  Fast heartbeats (palpitations).  Dizziness.  Fainting or light-headedness.  Unexplained weakness.  Shortness of breath. There are several types of monitors. Some common monitors include:  Holter monitor. This records your heart rhythm continuously, usually for 24-48 hours.  Event (episodic) monitor. This monitor has a symptoms button, and when pushed, it will begin recording. You need to activate this monitor to record when you have a heart-related symptom.  Automatic detection monitor. This monitor will begin recording when it detects an abnormal heartbeat. What are  the risks? Generally, these devices are safe to use. However, it is possible that the skin under the electrodes will become irritated. How to prepare for monitoring Your health care provider will prepare your chest for the electrode placement and show you how to use the monitor.  Do not apply lotions to your chest before monitoring.  Follow directions on how to care for the monitor, and how to return the monitor when the testing period is complete. How to use your cardiac monitor  Follow directions about how long to wear the monitor, and if you can take the monitor off in order to shower or bathe. ? Do not let the monitor get wet. ? Do not bathe, swim, or use a hot tub while wearing the monitor.  Keep your skin clean. Do not put body lotion or moisturizer on your chest.  Change the electrodes as told by your health care provider, or any time they stop sticking to your skin. You may need to use medical tape to keep them on.  Try to put the electrodes in slightly different places on your chest to help prevent skin irritation. Follow directions from your health care provider about where to place the electrodes.  Make sure the monitor is safely clipped to your clothing or in a location close to your body as recommended by your health care provider.  If your monitor has a symptoms button, press the button to mark an event as soon as you feel a heart-related symptom, such as: ? Dizziness. ? Weakness. ? Light-headedness. ? Palpitations. ? Thumping or pounding in your chest. ? Shortness of breath. ? Unexplained weakness.  Keep a diary of your activities, such as walking, doing chores, and taking medicine.  It is very important to note what you were doing when you pushed the button to record your symptoms. This will help your health care provider determine what might be contributing to your symptoms.  Send the recorded information as recommended by your health care provider. It may take some  time for your health care provider to process the results.  Change the batteries as told by your health care provider.  Keep electronic devices away from your monitor. These include: ? Tablets. ? MP3 players. ? Cell phones.  While wearing your monitor you should avoid: ? Electric blankets. ? Armed forces operational officer. ? Electric toothbrushes. ? Microwave ovens. ? Magnets. ? Metal detectors. Get help right away if:  You have chest pain.  You have shortness of breath or extreme difficulty breathing.  You develop a very fast heartbeat that does not get better.  You develop dizziness that does not go away.  You faint or constantly feel like you are about to faint. Summary  An ambulatory cardiac monitor is a small recording device that is used to detect abnormal heart rhythms (arrhythmias).  Make sure you understand how to send the information from the monitor to your health care provider.  It is important to press the button on the monitor when you have any heart-related symptoms.  Keep a diary of your activities, such as walking, doing chores, and taking medicine. It is very important to note what you were doing when you pushed the button to record your symptoms. This will help your health care provider learn what might be causing your symptoms. This information is not intended to replace advice given to you by your health care provider. Make sure you discuss any questions you have with your health care provider. Document Released: 06/17/2008 Document Revised: 08/21/2017 Document Reviewed: 08/23/2016 Elsevier Patient Education  2020 [employer].

## 2019-07-27 ENCOUNTER — Telehealth: Payer: Self-pay

## 2019-07-27 NOTE — Telephone Encounter (Signed)
Spoke to pt, went over monitor instructions. Verified address. 14 day ZIO ordered.  

## 2019-08-01 ENCOUNTER — Other Ambulatory Visit: Payer: Self-pay

## 2019-08-01 ENCOUNTER — Ambulatory Visit: Payer: No Typology Code available for payment source | Admitting: Licensed Clinical Social Worker

## 2019-08-08 ENCOUNTER — Ambulatory Visit (INDEPENDENT_AMBULATORY_CARE_PROVIDER_SITE_OTHER): Payer: No Typology Code available for payment source

## 2019-08-08 DIAGNOSIS — R002 Palpitations: Secondary | ICD-10-CM | POA: Diagnosis not present

## 2019-08-15 ENCOUNTER — Other Ambulatory Visit: Payer: Self-pay

## 2019-08-15 ENCOUNTER — Encounter: Payer: Self-pay | Admitting: Licensed Clinical Social Worker

## 2019-08-15 ENCOUNTER — Ambulatory Visit (INDEPENDENT_AMBULATORY_CARE_PROVIDER_SITE_OTHER): Payer: No Typology Code available for payment source | Admitting: Licensed Clinical Social Worker

## 2019-08-15 DIAGNOSIS — F3341 Major depressive disorder, recurrent, in partial remission: Secondary | ICD-10-CM | POA: Diagnosis not present

## 2019-08-15 DIAGNOSIS — F411 Generalized anxiety disorder: Secondary | ICD-10-CM | POA: Diagnosis not present

## 2019-08-15 NOTE — Progress Notes (Signed)
Virtual Visit via Video Note  I connected with Emma Rogers on 08/15/19 at  8:00 AM EST by a video enabled telemedicine application and verified that I am speaking with the correct person using two identifiers.   I discussed the limitations of evaluation and management by telemedicine and the availability of in person appointments. The patient expressed understanding and agreed to proceed.  I discussed the assessment and treatment plan with the patient. The patient was provided an opportunity to ask questions and all were answered. The patient agreed with the plan and demonstrated an understanding of the instructions.   The patient was advised to call back or seek an in-person evaluation if the symptoms worsen or if the condition fails to improve as anticipated.  I provided 60 minutes of non-face-to-face time during this encounter.   Emma Hipp, LCSW    THERAPIST PROGRESS NOTE  Session Time: 0800  Participation Level: Active  Behavioral Response: NeatAlertAnxious  Type of Therapy: Individual Therapy  Treatment Goals addressed: Anxiety  Interventions: Supportive  Summary: Emma Rogers is a 53 y.o. female who presents with continued symptoms related to her diagnosis. Emma Rogers reports doing well since our last session, but noted she has had ongoing problems with communication with her husband from whom she is currently separated. Emma Rogers reports she has been struggling with not feeling badly for leaving her husband, and for not wanting to work on the marriage. LCSW validated Emma Rogers's feelings and encouraged her to remember feelings are not facts. For instance, just because she is feeling badly does not mean she has done anything wrong. Emma Rogers was able to understand this idea and expressed agreement. Emma Rogers recounted several conversations between herself and her husband, wherein she was unsure if she responded correctly. LCSW held space for Emma Rogers to express her feelings and discuss  various ways she could have handled the situation. Emma Rogers expressed feeling she still does not want to go to marriage counseling, and is looking forward to the day she can get a divorce. LCSW validated these feelings and encouraged Emma Rogers to continue biding her time and utilizing assertive communication during her discussions with her husband. Emma Rogers expressed understanding and agreement.   Suicidal/Homicidal: No  Therapist Response: Emma Rogers continues to work towards her tx goals but has not yet reached them. We will continue to work on emotional regulation and improving assertive communication moving forward.   Plan: Return again in 2 weeks.  Diagnosis: Axis I: Generalized Anxiety Disorder    Axis II: No diagnosis    Emma Hipp, LCSW 08/15/2019

## 2019-08-22 DIAGNOSIS — F3341 Major depressive disorder, recurrent, in partial remission: Secondary | ICD-10-CM | POA: Insufficient documentation

## 2019-08-22 NOTE — Progress Notes (Signed)
No response to call or text. 

## 2019-08-23 ENCOUNTER — Ambulatory Visit (INDEPENDENT_AMBULATORY_CARE_PROVIDER_SITE_OTHER): Payer: Self-pay | Admitting: Psychiatry

## 2019-08-23 ENCOUNTER — Other Ambulatory Visit: Payer: Self-pay

## 2019-08-23 DIAGNOSIS — Z5329 Procedure and treatment not carried out because of patient's decision for other reasons: Secondary | ICD-10-CM

## 2019-08-24 ENCOUNTER — Ambulatory Visit (INDEPENDENT_AMBULATORY_CARE_PROVIDER_SITE_OTHER): Payer: No Typology Code available for payment source | Admitting: *Deleted

## 2019-08-24 DIAGNOSIS — R55 Syncope and collapse: Secondary | ICD-10-CM

## 2019-08-25 LAB — CUP PACEART REMOTE DEVICE CHECK
Battery Impedance: 613 Ohm
Battery Remaining Longevity: 78 mo
Battery Voltage: 2.78 V
Brady Statistic AP VP Percent: 1 %
Brady Statistic AP VS Percent: 33 %
Brady Statistic AS VP Percent: 0 %
Brady Statistic AS VS Percent: 66 %
Date Time Interrogation Session: 20201203131935
Implantable Lead Implant Date: 19960806
Implantable Lead Implant Date: 19960806
Implantable Lead Location: 753859
Implantable Lead Location: 753860
Implantable Lead Model: 4524
Implantable Lead Model: 5034
Implantable Pulse Generator Implant Date: 20110328
Lead Channel Impedance Value: 1102 Ohm
Lead Channel Impedance Value: 261 Ohm
Lead Channel Pacing Threshold Amplitude: 1.375 V
Lead Channel Pacing Threshold Amplitude: 1.5 V
Lead Channel Pacing Threshold Pulse Width: 0.4 ms
Lead Channel Pacing Threshold Pulse Width: 0.4 ms
Lead Channel Setting Pacing Amplitude: 2.75 V
Lead Channel Setting Pacing Amplitude: 3 V
Lead Channel Setting Pacing Pulse Width: 0.4 ms
Lead Channel Setting Sensing Sensitivity: 2 mV

## 2019-09-06 ENCOUNTER — Encounter: Payer: Self-pay | Admitting: Licensed Clinical Social Worker

## 2019-09-06 ENCOUNTER — Ambulatory Visit (INDEPENDENT_AMBULATORY_CARE_PROVIDER_SITE_OTHER): Payer: No Typology Code available for payment source | Admitting: Licensed Clinical Social Worker

## 2019-09-06 ENCOUNTER — Other Ambulatory Visit: Payer: Self-pay

## 2019-09-06 DIAGNOSIS — F411 Generalized anxiety disorder: Secondary | ICD-10-CM

## 2019-09-06 DIAGNOSIS — F3341 Major depressive disorder, recurrent, in partial remission: Secondary | ICD-10-CM

## 2019-09-06 NOTE — Progress Notes (Signed)
Virtual Visit via Telephone Note  I connected with Emma Rogers on 09/06/19 at  8:00 AM EST by telephone and verified that I am speaking with the correct person using two identifiers.   I discussed the limitations, risks, security and privacy concerns of performing an evaluation and management service by telephone and the availability of in person appointments. I also discussed with the patient that there may be a patient responsible charge related to this service. The patient expressed understanding and agreed to proceed.   I discussed the assessment and treatment plan with the patient. The patient was provided an opportunity to ask questions and all were answered. The patient agreed with the plan and demonstrated an understanding of the instructions.   The patient was advised to call back or seek an in-person evaluation if the symptoms worsen or if the condition fails to improve as anticipated.  I provided 30 minutes of non-face-to-face time during this encounter.   Alden Hipp, LCSW    THERAPIST PROGRESS NOTE  Session Time: 0800  Participation Level: Active  Behavioral Response: NeatAlertAnxious  Type of Therapy: Individual Therapy  Treatment Goals addressed: Coping  Interventions: Supportive  Summary: Emma Rogers is a 53 y.o. female who presents with continued symptoms related to her diagnosis. Emma Rogers reports doing well since our last session. However, she reports that her boss and best friend died of cancer recently, and she has been struggling with her grief. She reports feeling she and her boss were very close, but feels guilty that she has now been appointed to her position. "I feel like I took her job." LCSW validated Emma Rogers's feelings, and encouraged her to recognize her friend would probably be happy with the idea of her taking over her role. LCSW encouraged Emma Rogers to try to hear her friend's voice, and have conversations with her when she is unsure. We discussed  ways to process grief, and how the process is different for everyone. Emma Rogers reported, at times, she feels  Angry about various things--that she died so young, that she didn't talk to her much before she passed, etc. LCSW encouraged Emma Rogers to recognize these things are not mutually exclusive, and she can feel two feelings at once for someone. Emma Rogers expressed understanding and agreement. LCSW encouraged Emma Rogers to allow herself to feel what she feels without punishing herself for those feelings. Emma Rogers was in agreement with this information as well. Emma Rogers reported she had to cut this session short as she was being trained for her new position, but would work out later sessions in order to avoid this moving forward. LCSW assured Emma Rogers it was no big deal, and we would pick up the conversation in two weeks. LCSW also encouraged Emma Rogers to email LCSW if she  Needed support through the upcoming weeks.   Suicidal/Homicidal: No   Therapist Response: Emma Rogers continues to work towards her tx goals but has not yet reached them. We will continue to work on improving emotional regulation skills and distress tolerance moving forward.   Plan: Return again in 2 weeks.  Diagnosis: Axis I: Generalized Anxiety Disorder    Axis II: No diagnosis    Alden Hipp, LCSW 09/06/2019

## 2019-09-20 ENCOUNTER — Ambulatory Visit (INDEPENDENT_AMBULATORY_CARE_PROVIDER_SITE_OTHER): Payer: No Typology Code available for payment source | Admitting: Licensed Clinical Social Worker

## 2019-09-20 ENCOUNTER — Encounter: Payer: Self-pay | Admitting: Licensed Clinical Social Worker

## 2019-09-20 ENCOUNTER — Other Ambulatory Visit: Payer: Self-pay

## 2019-09-20 DIAGNOSIS — F3341 Major depressive disorder, recurrent, in partial remission: Secondary | ICD-10-CM

## 2019-09-20 DIAGNOSIS — F411 Generalized anxiety disorder: Secondary | ICD-10-CM

## 2019-09-20 NOTE — Progress Notes (Signed)
Virtual Visit via Telephone Note  I connected with Emma Rogers on 09/20/19 at  8:00 AM EST by telephone and verified that I am speaking with the correct person using two identifiers.   I discussed the limitations, risks, security and privacy concerns of performing an evaluation and management service by telephone and the availability of in person appointments. I also discussed with the patient that there may be a patient responsible charge related to this service. The patient expressed understanding and agreed to proceed.  I discussed the assessment and treatment plan with the patient. The patient was provided an opportunity to ask questions and all were answered. The patient agreed with the plan and demonstrated an understanding of the instructions.   The patient was advised to call back or seek an in-person evaluation if the symptoms worsen or if the condition fails to improve as anticipated.  I provided 60 minutes of non-face-to-face time during this encounter.   Alden Hipp, LCSW    THERAPIST PROGRESS NOTE  Session Time: 0800  Participation Level: Active  Behavioral Response: CasualAlertAnxious  Type of Therapy: Individual Therapy  Treatment Goals addressed: Coping  Interventions: Supportive  Summary: Emma Rogers is a 53 y.o. female who presents with continued symptoms related to her diagnosis. Emma Rogers reports doing well since our last session. She reports ongoing problems with her husband, but states she continues to set boundaries with him. Emma Rogers reports her husband has continued texting her in the evenings, and it is apparent he has been drinking prior to these conversations. Emma Rogers reported she rarely replies but when she does is very short and to the point. She reports she was planning to spend time with him on Christmas, but ended up not doing so due to him becoming angry about something unrelated. She reports she finally told him she has no interest in getting back  together, and her end goal is a divorce. LCSW validated Emma Rogers's use of assertive communication and encouraged her to continue setting very clear, concise boundaries with her husband. Emma Rogers expressed understanding and agreement. Emma Rogers went on to discuss work stress related to moving into her new position at work. LCSW validated her feelings and recognized how difficult it can be to walk into a position and feel like you're supposed to know everything. LCSW encouraged Emma Rogers to lean into the uncertainty and let others know she may not know the answers to everything at this moment, but will continue to look for them, etc. Emma Rogers expressed understanding and agreement.    Suicidal/Homicidal: No  Therapist Response: Emma Rogers continues to work towards her tx goals but has not yet reached them. We will continue to work on improving emotional regulation skills and assertive communication moving forward.   Plan: Return again in 2 weeks.  Diagnosis: Axis I: MDD    Axis II: No diagnosis    Alden Hipp, LCSW 09/20/2019

## 2019-09-26 ENCOUNTER — Encounter: Payer: Self-pay | Admitting: Family Medicine

## 2019-09-26 ENCOUNTER — Telehealth: Payer: Self-pay

## 2019-09-26 ENCOUNTER — Ambulatory Visit: Payer: No Typology Code available for payment source | Admitting: Family Medicine

## 2019-09-26 NOTE — Telephone Encounter (Signed)
Patient was a no call/no show for their appointment today.   

## 2019-09-29 LAB — CUP PACEART INCLINIC DEVICE CHECK
Date Time Interrogation Session: 20201103131907
Implantable Lead Implant Date: 19960806
Implantable Lead Implant Date: 19960806
Implantable Lead Location: 753859
Implantable Lead Location: 753860
Implantable Lead Model: 4524
Implantable Lead Model: 5034
Implantable Pulse Generator Implant Date: 20110328

## 2019-10-03 ENCOUNTER — Other Ambulatory Visit: Payer: Self-pay

## 2019-10-03 ENCOUNTER — Encounter: Payer: Self-pay | Admitting: Licensed Clinical Social Worker

## 2019-10-03 ENCOUNTER — Ambulatory Visit (INDEPENDENT_AMBULATORY_CARE_PROVIDER_SITE_OTHER): Payer: No Typology Code available for payment source | Admitting: Licensed Clinical Social Worker

## 2019-10-03 DIAGNOSIS — F411 Generalized anxiety disorder: Secondary | ICD-10-CM | POA: Diagnosis not present

## 2019-10-03 DIAGNOSIS — F3341 Major depressive disorder, recurrent, in partial remission: Secondary | ICD-10-CM

## 2019-10-03 NOTE — Progress Notes (Signed)
Virtual Visit via Telephone Note  I connected with ELEN ACERO on 10/03/19 at  9:00 AM EST by telephone and verified that I am speaking with the correct person using two identifiers.   I discussed the limitations, risks, security and privacy concerns of performing an evaluation and management service by telephone and the availability of in person appointments. I also discussed with the patient that there may be a patient responsible charge related to this service. The patient expressed understanding and agreed to proceed.  I discussed the assessment and treatment plan with the patient. The patient was provided an opportunity to ask questions and all were answered. The patient agreed with the plan and demonstrated an understanding of the instructions.   The patient was advised to call back or seek an in-person evaluation if the symptoms worsen or if the condition fails to improve as anticipated.  I provided 53 minutes of non-face-to-face time during this encounter.   Heidi Dach, LCSW    THERAPIST PROGRESS NOTE  Session Time: 0900  Participation Level: Active  Behavioral Response: NeatAlertAnxious  Type of Therapy: Individual Therapy  Treatment Goals addressed: Coping  Interventions: Supportive  Summary: LUDY MESSAMORE is a 54 y.o. female who presents with continued symptoms related to her diagnosis. Jodel reports doing well since our last session. She reports things have felt more hectic at times, but she has been attempting to manage moments of anxiety through skills learned in previous session. Keitra reports her husband has been contacting her more frequently, and makes it obvious he has been drinking. Ruwayda reports after she is able to get her belongings out of the home, she will be able to avoid contact more effectively. LCSW validated Adamaris's feelings, and encouraged her to recognize she can be civil without answering his calls at all times. Rihanna expressed  understanding and agreement with this information. Further, Arthea reported feeling upset about the women commenting on her husband's post about them being separated. Sammye expressed feeling upset with herself for feeling that way. LCSW validated Clovis's feelings and normalized them. We discussed how it would be unusual if she did not have some sort of feelings--as they were married and together for a long time. LCSW encouraged Dandria to allow herself to feel however she is feeling without judgement or expectation, and just allow herself to feel her feelings. We discussed ways Margart could manage those feelings in the moment as well. We also discussed not following her husband on Facebook in order to create more space.   Suicidal/Homicidal: No  Therapist Response: Sanjna continues to work towards her tx goals but has not yet reached them. We will continue to work on improving emotional regulation and distress tolerance skills.   Plan: Return again in 2 weeks.  Diagnosis: Axis I: Generalized Anxiety Disorder    Axis II: No diagnosis    Heidi Dach, LCSW 10/03/2019

## 2019-10-12 ENCOUNTER — Ambulatory Visit: Payer: No Typology Code available for payment source | Admitting: Family Medicine

## 2019-10-12 ENCOUNTER — Other Ambulatory Visit: Payer: Self-pay

## 2019-10-12 ENCOUNTER — Encounter: Payer: Self-pay | Admitting: Family Medicine

## 2019-10-12 VITALS — BP 122/90 | HR 73 | Temp 97.2°F | Ht 66.0 in | Wt 208.8 lb

## 2019-10-12 DIAGNOSIS — R42 Dizziness and giddiness: Secondary | ICD-10-CM | POA: Diagnosis not present

## 2019-10-12 DIAGNOSIS — R413 Other amnesia: Secondary | ICD-10-CM

## 2019-10-12 DIAGNOSIS — G43009 Migraine without aura, not intractable, without status migrainosus: Secondary | ICD-10-CM | POA: Diagnosis not present
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
## 2019-10-12 NOTE — Progress Notes (Signed)
I have read the note, and I agree with the clinical assessment and plan.  Juluis Fitzsimmons A. Tome Wilson, MD, PhD, FAAN Certified in Neurology, Clinical Neurophysiology, Sleep Medicine, Pain Medicine and Neuroimaging  [facility] [address], [address] [place], [place] [address] [phone]  

## 2019-10-12 NOTE — Patient Instructions (Signed)
  CT Head for evaluation   Vestibular therapy as referred by ENT  PCP for concerns of palpitations  Stay hydrated, well balanced diet and regular exercise   Follow up with Dr Epimenio Foot 4-5 months   Vertigo Vertigo is the feeling that you or the things around you are moving when they are not. This feeling can come and go at any time. Vertigo often goes away on its own. This condition can be dangerous if it happens when you are doing activities like driving or working with machines. Your doctor will do tests to find the cause of your vertigo. These tests will also help your doctor decide on the best treatment for you. Follow these instructions at home: Eating and drinking      Drink enough fluid to keep your pee (urine) pale yellow.  Do not drink alcohol. Activity  Return to your normal activities as told by your doctor. Ask your doctor what activities are safe for you.  In the morning, first sit up on the side of the bed. When you feel okay, stand slowly while you hold onto something until you know that your balance is fine.  Move slowly. Avoid sudden body or head movements or certain positions, as told by your doctor.  Use a cane if you have trouble standing or walking.  Sit down right away if you feel dizzy.  Avoid doing any tasks or activities that can cause danger to you or others if you get dizzy.  Avoid bending down if you feel dizzy. Place items in your home so that they are easy for you to reach without leaning over.  Do not drive or use heavy machinery if you feel dizzy. General instructions  Take over-the-counter and prescription medicines only as told by your doctor.  Keep all follow-up visits as told by your doctor. This is important. Contact a doctor if:  Your medicine does not help your vertigo.  You have a fever.  Your problems get worse or you have new symptoms.  Your family or friends see changes in your behavior.  The feeling of being sick to your  stomach gets worse.  Your vomiting gets worse.  You lose feeling (have numbness) in part of your body.  You feel prickling and tingling in a part of your body. Get help right away if:  You have trouble moving or talking.  You are always dizzy.  You pass out (faint).  You get very bad headaches.  You feel weak in your hands, arms, or legs.  You have changes in your hearing.  You have changes in how you see (vision).  You get a stiff neck.  Bright light starts to bother you. Summary  Vertigo is the feeling that you or the things around you are moving when they are not.  Your doctor will do tests to find the cause of your vertigo.  You may be told to avoid some tasks, positions, or movements.  Contact a doctor if your medicine is not helping, or if you have a fever, new symptoms, or a change in behavior.  Get help right away if you get very bad headaches, or if you have changes in how you speak, hear, or see. This information is not intended to replace advice given to you by your health care provider. Make sure you discuss any questions you have with your health care provider. Document Revised: 08/02/2018 Document Reviewed: 08/02/2018 Elsevier Patient Education  2020 [employer].

## 2019-10-24 ENCOUNTER — Ambulatory Visit (INDEPENDENT_AMBULATORY_CARE_PROVIDER_SITE_OTHER): Payer: No Typology Code available for payment source | Admitting: Licensed Clinical Social Worker

## 2019-10-24 ENCOUNTER — Encounter: Payer: Self-pay | Admitting: Licensed Clinical Social Worker

## 2019-10-24 ENCOUNTER — Other Ambulatory Visit: Payer: Self-pay

## 2019-10-24 DIAGNOSIS — F3341 Major depressive disorder, recurrent, in partial remission: Secondary | ICD-10-CM | POA: Diagnosis not present

## 2019-10-24 DIAGNOSIS — F411 Generalized anxiety disorder: Secondary | ICD-10-CM

## 2019-10-24 NOTE — Progress Notes (Signed)
Virtual Visit via Video Note  I connected with Emma Rogers on 10/24/19 at  9:00 AM EST by a video enabled telemedicine application and verified that I am speaking with the correct person using two identifiers.   I discussed the limitations of evaluation and management by telemedicine and the availability of in person appointments. The patient expressed understanding and agreed to proceed.  I discussed the assessment and treatment plan with the patient. The patient was provided an opportunity to ask questions and all were answered. The patient agreed with the plan and demonstrated an understanding of the instructions.   The patient was advised to call back or seek an in-person evaluation if the symptoms worsen or if the condition fails to improve as anticipated.  I provided 60 minutes of non-face-to-face time during this encounter.   Heidi Dach, LCSW    THERAPIST PROGRESS NOTE  Session Time: 0900  Participation Level: Active  Behavioral Response: NeatAlertAnxious  Type of Therapy: Individual Therapy  Treatment Goals addressed: Coping  Interventions: Strength-based  Summary: Emma Rogers is a 54 y.o. female who presents with continued symptoms related to her diagnosis. Emma Rogers reports doing well since our last session, but noted ongoing stress around feeling she is doing things well. LCSW held space for Emma Rogers to elaborate on areas she is feeling anxious about. Emma Rogers reported feeling increasing stress associated with her job role, and attempting to meet the needs of those she is managing. LCSW validated and normalized Emma Rogers's feelings, and encouraged her to recognize she has to transition her mentality about work as well as her job role. 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 We discussed ways she could feel more comfortable in that role, and ways she could check in with her staff to ensure she is doing all she can to support them. Emma Rogers expressed  understanding and agreement. Emma Rogers went on to discuss missing her husband a little bit. LCSW held space for Emma Rogers to discuss situations around her marriage. LCSW encouraged Emma Rogers to recognize she can miss her husband without wanting to go back to him. We reviewedCBT skills Peityn can utilize in moments of missing her husband. Rick expressed understanding and agreement.   Suicidal/Homicidal: No  Therapist Response: Emma Rogers continues to work towards her tx goals but has not yet reached them. We will continue to work on improving emotional regulation skills and distress tolerance moving forward.  Plan: Return again in 2 weeks.  Diagnosis: Axis I: Generalized Anxiety Disorder    Axis II: No diagnosis    Heidi Dach, LCSW 10/24/2019

## 2019-10-27 ENCOUNTER — Ambulatory Visit
Admission: RE | Admit: 2019-10-27 | Discharge: 2019-10-27 | Disposition: A | Discharge status: Home / Self Care | Payer: No Typology Code available for payment source | Source: Ambulatory Visit | Attending: Family Medicine | Admitting: Family Medicine

## 2019-10-27 DIAGNOSIS — R413 Other amnesia: Secondary | ICD-10-CM

## 2019-10-27 DIAGNOSIS — G43009 Migraine without aura, not intractable, without status migrainosus: Secondary | ICD-10-CM

## 2019-10-27 DIAGNOSIS — R42 Dizziness and giddiness: Secondary | ICD-10-CM

## 2019-10-31 ENCOUNTER — Telehealth: Payer: Self-pay | Admitting: *Deleted

## 2019-10-31 NOTE — Telephone Encounter (Signed)
Attempted to reach patient with CT head results. Phone rang once then immediately rang busy.

## 2019-11-02 NOTE — Telephone Encounter (Signed)
LVM informing her that Dr Epimenio Foot did read her CT of head. Everything looked good! Hope she is feeling better. Left # for questions.

## 2019-11-07 ENCOUNTER — Ambulatory Visit (INDEPENDENT_AMBULATORY_CARE_PROVIDER_SITE_OTHER): Payer: No Typology Code available for payment source | Admitting: Licensed Clinical Social Worker

## 2019-11-07 ENCOUNTER — Other Ambulatory Visit: Payer: Self-pay

## 2019-11-07 ENCOUNTER — Encounter: Payer: Self-pay | Admitting: Licensed Clinical Social Worker

## 2019-11-07 DIAGNOSIS — F411 Generalized anxiety disorder: Secondary | ICD-10-CM

## 2019-11-07 DIAGNOSIS — F3341 Major depressive disorder, recurrent, in partial remission: Secondary | ICD-10-CM | POA: Diagnosis not present
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
## 2019-11-21 ENCOUNTER — Other Ambulatory Visit: Payer: Self-pay

## 2019-11-21 ENCOUNTER — Ambulatory Visit (INDEPENDENT_AMBULATORY_CARE_PROVIDER_SITE_OTHER): Payer: No Typology Code available for payment source | Admitting: Licensed Clinical Social Worker

## 2019-11-21 ENCOUNTER — Encounter: Payer: Self-pay | Admitting: Licensed Clinical Social Worker

## 2019-11-21 DIAGNOSIS — F411 Generalized anxiety disorder: Secondary | ICD-10-CM

## 2019-11-21 DIAGNOSIS — F3341 Major depressive disorder, recurrent, in partial remission: Secondary | ICD-10-CM | POA: Diagnosis not present
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
## 2019-11-28 ENCOUNTER — Other Ambulatory Visit: Payer: Self-pay | Admitting: Family Medicine

## 2019-11-28 ENCOUNTER — Other Ambulatory Visit: Payer: Self-pay | Admitting: Psychiatry

## 2019-11-28 DIAGNOSIS — G4701 Insomnia due to medical condition: Secondary | ICD-10-CM

## 2019-11-28 DIAGNOSIS — G43009 Migraine without aura, not intractable, without status migrainosus: Secondary | ICD-10-CM

## 2019-11-28 MED ORDER — TRAZODONE HCL 100 MG PO TABS: 50.0000 mg | ORAL_TABLET | Freq: Every evening | ORAL | 0 refills | Status: DC | PRN

## 2019-11-28 NOTE — Telephone Encounter (Signed)
Patient last seen 08/20. Was supposed to f/up 09/20 but did not.

## 2019-11-28 NOTE — Telephone Encounter (Signed)
Sent refill for trazodone to pharmacy however per review of E HR she is on a lower dosage of 50 to 100 mg at bedtime.

## 2019-11-28 NOTE — Telephone Encounter (Signed)
Requested medication (s) are due for refill today:yes  Requested medication (s) are on the active medication list:yes  Last refill:  08/17/2018  Future visit scheduled:no  Notes to clinic:  last seen by Sharyon Cable   Requested Prescriptions  Pending Prescriptions Disp Refills   rizatriptan (MAXALT) 10 MG tablet [Pharmacy Med Name: RIZATRIPTAN BENZOATE 10 MG 10 Tablet] 10 tablet 1    Sig: TAKE 1 TABLET (10 MG TOTAL) BY MOUTH AS NEEDED FOR MIGRAINE. MAY REPEAT IN 2 HOURS IF NEEDED      Neurology:  Migraine Therapy - Triptan Failed - 11/28/2019  3:05 PM      Failed - Last BP in normal range    BP Readings from Last 1 Encounters:  10/12/19 122/90          Passed - Valid encounter within last 12 months    Recent Outpatient Visits           6 months ago Vertigo of central origin   [facility], Megan P, DO   7 months ago Migraine without aura and without status migrainosus, not intractable   [facility] [place], Megan P, DO   8 months ago Migraine without aura and without status migrainosus, not intractable   [facility], Megan P, DO   9 months ago Acute nonintractable headache, unspecified headache type   [facility] [name], [name], NP   10 months ago Memory impairment   [facility] [place], [name], NP

## 2019-11-29 ENCOUNTER — Ambulatory Visit (INDEPENDENT_AMBULATORY_CARE_PROVIDER_SITE_OTHER): Payer: No Typology Code available for payment source | Admitting: *Deleted

## 2019-11-29 ENCOUNTER — Ambulatory Visit: Payer: No Typology Code available for payment source | Admitting: Physical Therapy

## 2019-11-29 ENCOUNTER — Encounter: Payer: No Typology Code available for payment source | Admitting: Physical Therapy

## 2019-11-29 DIAGNOSIS — R55 Syncope and collapse: Secondary | ICD-10-CM | POA: Diagnosis not present

## 2019-11-29 LAB — CUP PACEART REMOTE DEVICE CHECK
Battery Impedance: 737 Ohm
Battery Remaining Longevity: 71 mo
Battery Voltage: 2.78 V
Brady Statistic AP VP Percent: 1 %
Brady Statistic AP VS Percent: 26 %
Brady Statistic AS VP Percent: 0 %
Brady Statistic AS VS Percent: 73 %
Date Time Interrogation Session: 20210309081507
Implantable Lead Implant Date: 19960806
Implantable Lead Implant Date: 19960806
Implantable Lead Location: 753859
Implantable Lead Location: 753860
Implantable Lead Model: 4524
Implantable Lead Model: 5034
Implantable Pulse Generator Implant Date: 20110328
Lead Channel Impedance Value: 1146 Ohm
Lead Channel Impedance Value: 281 Ohm
Lead Channel Pacing Threshold Amplitude: 1.125 V
Lead Channel Pacing Threshold Amplitude: 1.375 V
Lead Channel Pacing Threshold Pulse Width: 0.4 ms
Lead Channel Pacing Threshold Pulse Width: 0.4 ms
Lead Channel Setting Pacing Amplitude: 2.5 V
Lead Channel Setting Pacing Amplitude: 2.75 V
Lead Channel Setting Pacing Pulse Width: 0.4 ms
Lead Channel Setting Sensing Sensitivity: 2 mV

## 2019-11-30 NOTE — Progress Notes (Signed)
PPM Remote  

## 2019-12-05 ENCOUNTER — Encounter: Payer: Self-pay | Admitting: Licensed Clinical Social Worker

## 2019-12-05 ENCOUNTER — Other Ambulatory Visit: Payer: Self-pay

## 2019-12-05 ENCOUNTER — Ambulatory Visit (INDEPENDENT_AMBULATORY_CARE_PROVIDER_SITE_OTHER): Payer: No Typology Code available for payment source | Admitting: Licensed Clinical Social Worker

## 2019-12-05 DIAGNOSIS — F411 Generalized anxiety disorder: Secondary | ICD-10-CM

## 2019-12-05 DIAGNOSIS — F3341 Major depressive disorder, recurrent, in partial remission: Secondary | ICD-10-CM
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
## 2019-12-06 ENCOUNTER — Encounter: Payer: No Typology Code available for payment source | Admitting: Physical Therapy

## 2019-12-20 ENCOUNTER — Other Ambulatory Visit: Payer: Self-pay | Admitting: Psychiatry

## 2019-12-20 ENCOUNTER — Encounter: Payer: No Typology Code available for payment source | Admitting: Physical Therapy

## 2019-12-20 ENCOUNTER — Other Ambulatory Visit: Payer: Self-pay | Admitting: Family Medicine

## 2019-12-20 DIAGNOSIS — F331 Major depressive disorder, recurrent, moderate: Secondary | ICD-10-CM

## 2019-12-20 MED ORDER — BUPROPION HCL ER (XL) 300 MG PO TB24: 300.0000 mg | ORAL_TABLET | Freq: Every day | ORAL | 0 refills | Status: DC

## 2019-12-20 NOTE — Telephone Encounter (Signed)
We will give her 30-day supply of bupropion.  Patient needs appointment.

## 2019-12-23 ENCOUNTER — Other Ambulatory Visit: Payer: Self-pay

## 2019-12-23 ENCOUNTER — Encounter: Payer: Self-pay | Admitting: Licensed Clinical Social Worker

## 2019-12-23 ENCOUNTER — Ambulatory Visit (INDEPENDENT_AMBULATORY_CARE_PROVIDER_SITE_OTHER): Payer: No Typology Code available for payment source | Admitting: Licensed Clinical Social Worker

## 2019-12-23 DIAGNOSIS — F3341 Major depressive disorder, recurrent, in partial remission: Secondary | ICD-10-CM
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
## 2019-12-27 ENCOUNTER — Encounter: Payer: No Typology Code available for payment source | Admitting: Physical Therapy

## 2019-12-30 ENCOUNTER — Ambulatory Visit
Admission: EM | Admit: 2019-12-30 | Discharge: 2019-12-30 | Disposition: A | Discharge status: Home / Self Care | Payer: No Typology Code available for payment source | Attending: Urgent Care | Admitting: Urgent Care

## 2019-12-30 ENCOUNTER — Other Ambulatory Visit: Payer: Self-pay

## 2019-12-30 ENCOUNTER — Encounter: Payer: Self-pay | Admitting: Emergency Medicine

## 2019-12-30 ENCOUNTER — Ambulatory Visit: Payer: Self-pay | Admitting: *Deleted

## 2019-12-30 ENCOUNTER — Ambulatory Visit (INDEPENDENT_AMBULATORY_CARE_PROVIDER_SITE_OTHER): Payer: No Typology Code available for payment source

## 2019-12-30 DIAGNOSIS — M25561 Pain in right knee: Secondary | ICD-10-CM

## 2019-12-30 DIAGNOSIS — W19XXXA Unspecified fall, initial encounter: Secondary | ICD-10-CM

## 2019-12-30 DIAGNOSIS — S80211A Abrasion, right knee, initial encounter: Secondary | ICD-10-CM

## 2019-12-30 MED ORDER — TRAMADOL HCL 50 MG PO TABS: 50.0000 mg | ORAL_TABLET | Freq: Three times a day (TID) | ORAL | 0 refills | Status: DC | PRN

## 2019-12-30 MED ORDER — MELOXICAM 15 MG PO TABS: 15.0000 mg | ORAL_TABLET | Freq: Every day | ORAL | 0 refills | Status: DC

## 2019-12-30 MED ORDER — TRAMADOL-ACETAMINOPHEN 37.5-325 MG PO TABS: 1.0000 | ORAL_TABLET | Freq: Three times a day (TID) | ORAL | 0 refills | Status: DC | PRN

## 2019-12-30 MED ORDER — BACITRACIN ZINC 500 UNIT/GM EX OINT: TOPICAL_OINTMENT | Freq: Once | CUTANEOUS | Status: AC

## 2019-12-30 NOTE — Discharge Instructions (Addendum)
It was very nice seeing you today in clinic. Thank you for entrusting me with your care.   XRAY was negative, however this does not rule out a more serious injury to the ligaments or cartilage. Rest, ice, and elevate your knee. Use antiinflammatory medication as directed. If not improving, please following up with your PCP and/or orthopedics to discuss further evaluation.   Patient educated on need for daily wound care. She was encouraged to keep wound clean and dry. She was instructed to apply antibiotic ointment 1-2 times daily. Monitor for signs and symptoms of infection, which would include increased redness, swelling, streaking, drainage, pain, and the development of a fever.    If your symptoms/condition worsens, please seek follow up care either here or in the ER. Please remember, our [facility] providers are "right here with you" when you need [place].   Again, it was my pleasure to take care of you today. Thank you for choosing our clinic. I hope that you start to feel better quickly.   Quentin Mulling, MSN, APRN, FNP-C, CEN Advanced Practice Provider [facility]

## 2019-12-30 NOTE — ED Triage Notes (Signed)
Patient states that she rolled her left ankle on Tuesday.  Patient states that this happened again on today but she fell on the concrete.  Patient states that she landed on her right knee.  Patient c/o ongoing pain in her right knee.

## 2019-12-30 NOTE — Telephone Encounter (Signed)
  Patient is calling - she fell this morning and hit her knee. Patient states she has scrapes and a knot which is getting larger. Patient has pain with movement and walking. Advised UC for evaluation. Reason for Disposition . [1] SEVERE pain AND [2] not improved 2 hours after pain medicine/ice packs    Patient fell 45 minutes ago going into hospital for COVID vaccine- her knee is injured and has knot that is swelling. Patient states it is painful- 6-8 scale. Advised UC.  Answer Assessment - Initial Assessment Questions 1. MECHANISM: "How did the injury happen?" (e.g., twisting injury, direct blow)      Patient had L foot rolled over and caused fall to R knee 2. ONSET: "When did the injury happen?" (Minutes or hours ago)      Today- 45 minutes ago 3. LOCATION: "Where is the injury located?"      R knee 4. APPEARANCE of INJURY: "What does the injury look like?"      Scrapes and small knot- quarter size 5. SEVERITY: "Can you put weight on that leg?" "Can you walk?"      Yes- trouble driving with bending knee 6. SIZE: For cuts, bruises, or swelling, ask: "How large is it?" (e.g., inches or centimeters;  entire joint)      Scratches, small knot 7. PAIN: "Is there pain?" If so, ask: "How bad is the pain?"    (e.g., Scale 1-10; or mild, moderate, severe)     Pain - 6-8 8. TETANUS: For any breaks in the skin, ask: "When was the last tetanus booster?"     yes 9. OTHER SYMPTOMS: "Do you have any other symptoms?"  (e.g., "pop" when knee injured, swelling, locking, buckling)      Small knot 10. PREGNANCY: "Is there any chance you are pregnant?" "When was your last menstrual period?"       n/a  Protocols used: KNEE INJURY-A-AH

## 2019-12-31 NOTE — ED Provider Notes (Signed)
[place], [place]   Name: Emma Rogers DOB: [birth year] MRN: [id number] CSN: [id number] PCP: Dorcas Carrow, DO  Arrival date and time:  12/30/19 1221  Chief Complaint:  Knee Pain  NOTE: Prior to seeing the patient today, I have reviewed the triage nursing documentation and vital signs. Clinical staff has updated patient's PMH/PSHx, current medication list, and drug allergies/intolerances to ensure comprehensive history available to assist in medical decision making.   History:   HPI: Emma Rogers is a 54 y.o. female who presents today with complaints of pain in her RIGHT knee following a fall. Patient describes that she has been dealing with instability issues in her LEFT ankle for over a year. She notes that she frequently "rolls" her ankle with ambulation. Patient advising that she suffered a recurrent inversion of her ankle today while ambulating causing her to sustain a fall to the ground. When she fell, patient notes that she fell directly onto her RIGHT knee. Patient did not hit her head during the fall; no LOC. She denies pain in the LEFT ankle, however notes pain her knee that she self rated 6/10. PMH is not significant for any sort of knee injuries or surgeries. She has FROM and intact distal sensation. She has a small abrasion to the anterior aspect of her knee just inferior the patella; bleeding controlled. Due to the acute nature of the events leading to today's urgent care visit, patient has not taken any over the counter interventions to help with her pain.   Past Medical History:  Diagnosis Date  . Abnormal MRI 04/03/2019  . Allergic rhinitis   . Anxiety   . Asthma   . Depression   . Headache(784.0)   . Renal insufficiency    Stage 3 kidney disease.   . Shingles     Past Surgical History:  Procedure Laterality Date  . ABDOMINAL HYSTERECTOMY  2009   still has 1 ovary  . BREAST BIOPSY Left 09/30/2002   Ductogram-neg  . INSERT / REPLACE / REMOVE PACEMAKER     Permanent pacemaker, Dr. [name]  . Lobe hemithroidectomy     Right    Family History  Problem Relation Age of Onset  . Heart disease Father        pacemaker,arrythmias  . Lung disease Father   . Asthma Other   . Coronary artery disease Other        Female 1st degree relative <50  . Lung cancer Other   . Prostate cancer Other        1st degree relative <50  . COPD Maternal Grandmother   . Breast cancer Maternal Grandmother   . Heart disease Maternal Grandfather   . Liver disease Paternal Grandmother   . Heart disease Paternal Grandfather     Social History   Tobacco Use  . Smoking status: Never Smoker  . Smokeless tobacco: Never Used  Substance Use Topics  . Alcohol use: No    Alcohol/week: 0.0 standard drinks  . Drug use: No    Patient Active Problem List   Diagnosis Date Noted  . MDD (major depressive disorder), recurrent, in partial remission (HCC) 08/22/2019  . Vertigo 06/16/2019  . MDD (major depressive disorder), recurrent episode, moderate (HCC) 06/07/2019  . MDD (major depressive disorder), recurrent episode, mild (HCC) 05/26/2019  . GAD (generalized anxiety disorder) 05/26/2019  . Memory deficit 04/26/2019  . Sleeping difficulty 04/13/2019  . Chronic low back pain without sciatica 03/08/2019  . Hip pain, acute, left 03/08/2019  .  Seizure-like activity (HCC) 03/08/2019  . Moderate persistent asthma 01/27/2019  . Left leg weakness 12/30/2018  . Cervical spinal stenosis 12/30/2018  . Benign essential tremor 12/30/2018  . Chronic venous insufficiency 03/10/2018  . Lymphedema 03/10/2018  . Anxiety 10/13/2017  . Familial hyperlipidemia 10/10/2017  . Depression 09/03/2017  . Chronic kidney disease, stage III (moderate) 09/03/2017  . Vitamin D deficiency 09/03/2017  . Vitamin B12 deficiency 09/03/2017  . Insomnia 08/11/2017  . Menopausal symptoms 08/11/2017  . Migraine without aura and without status migrainosus, not intractable 02/21/2017  . Prediabetes  02/20/2017  . 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 VASOVAGAL SYNCOPE 11/27/2009  . Allergic rhinitis 01/18/2008    Home Medications:    Current Meds  Medication Sig  . atorvastatin (LIPITOR) 40 MG tablet Take 1 tablet (40 mg total) by mouth at bedtime.  . budesonide-formoterol (SYMBICORT) 80-4.5 MCG/ACT inhaler Inhale 2 puffs into the lungs 2 (two) times daily.  Marland Kitchen buPROPion (WELLBUTRIN XL) 300 MG 24 hr tablet Take 1 tablet (300 mg total) by mouth daily.  . Cyanocobalamin 1000 MCG CAPS Take 1 capsule by mouth daily.  . rizatriptan (MAXALT) 10 MG tablet TAKE 1 TABLET (10 MG TOTAL) BY MOUTH AS NEEDED FOR MIGRAINE. MAY REPEAT IN 2 HOURS IF NEEDED  . traZODone (DESYREL) 100 MG tablet Take 0.5-1 tablets (50-100 mg total) by mouth at bedtime as needed. for sleep  . VITAMIN D PO Take 1 tablet by mouth daily.    Allergies:   Amoxicillin, Clavulanic acid, and Morphine and related  Review of Systems (ROS):  Review of systems NEGATIVE unless otherwise noted in narrative H&P section.   Vital Signs: Today's Vitals   12/30/19 1234 12/30/19 1235 12/30/19 1237 12/30/19 1351  BP:   121/67   Pulse:   63   Resp:   14   Temp:   98.3 F (36.8 C)   TempSrc:   Oral   SpO2:   100%   Weight:  205 lb (93 kg)    Height:  5\' 6"  (1.676 m)    PainSc: 6    6     Physical Exam: Physical Exam  Constitutional: She is oriented to person, place, and time and well-developed, well-nourished, and in no distress.  HENT:  Head: Normocephalic and atraumatic.  Eyes: Pupils are equal, round, and reactive to light.  Cardiovascular: Normal rate and intact distal pulses.  Pulmonary/Chest: Effort normal. No respiratory distress.  Musculoskeletal:     Cervical back: Full passive range of motion without pain.     Right knee: Swelling present. No deformity, effusion, erythema, ecchymosis or lacerations. Normal range of motion. Tenderness (generalized) present. No LCL laxity.  Normal alignment.     Comments: (+) PMS with no distal weakness of paresthesias. Color, temperature, and capillary refill WNL distally.   Neurological: She is alert and oriented to person, place, and time. She has normal sensation, normal strength and normal reflexes. Gait (2/2 acute knee injury) abnormal.  Skin: Skin is warm and dry. Abrasion (anterior RIGHT knee) noted. No rash noted. She is not diaphoretic.  Psychiatric: Mood, memory, affect and judgment normal.  Nursing note and vitals reviewed.   Urgent Care Treatments / Results:   Orders Placed This Encounter  Procedures  . DG Knee Complete 4 Views Right  . Apply dressing    LABS: PLEASE NOTE: all labs that were ordered this encounter are listed, however only abnormal results are displayed. Labs Reviewed - No data to display  EKG: -None  RADIOLOGY: DG Knee Complete 4 Views Right  Result Date: 12/30/2019 CLINICAL DATA:  Pain following fall EXAM: RIGHT KNEE - COMPLETE 4+ VIEW COMPARISON:  None. FINDINGS: Upright frontal, upright tunnel, lateral, and sunrise patellar images obtained. No fracture or dislocation. No joint effusion. Joint spaces appear normal. No erosive change. IMPRESSION: No fracture or dislocation. No joint effusion. No evident arthropathy. Electronically Signed   By: Lowella Grip III M.D.   On: 12/30/2019 13:33    PROCEDURES: Procedures  MEDICATIONS RECEIVED THIS VISIT: Medications  bacitracin ointment ( Topical Given 12/30/19 1346)    PERTINENT CLINICAL COURSE NOTES/UPDATES:   Initial Impression / Assessment and Plan / Urgent Care Course:  Pertinent labs & imaging results that were available during my care of the patient were personally reviewed by me and considered in my medical decision making (see lab/imaging section of note for values and interpretations).  Emma Rogers is a 54 y.o. female who presents to [facility] today with complaints of Knee Pain  Patient is well appearing overall  in clinic today. She does not appear to be in any acute distress. Presenting symptoms (see HPI) and exam as documented above. Pain status post a mechanical fall earlier today. Diagnostic radiographs of the RIGHT knee revealed no acute abnormalities; no fracture, dislocation, or effusion. Discussed that the absence of osseous abnormalities do not rule out a more serious issues with the knee. I explained that there could still be the potential for ligamentous and/or cartilaginous injuries that are not visible on plain radiographs. Outpatient plan of care as follows:   Wound cleansed and dressed in clinic by nursing staff; Bacitracin applied. Patient educated on need for daily wound care. She was encouraged to keep wound clean and dry. She was instructed to apply antibiotic ointment 1-2 times daily. Patient to monitor for signs and symptoms of infection, which would include increased redness, swelling, streaking, drainage, pain, and the development of a fever.     Will pursue treatment using anti-inflammatory medication (meloxicam). Patient is opioid naive, however I feel as if her pain is significant and stands to worsen with normal ADL/IADLs. Discussed lower potency options. Will send in a prescription for a short course of Ultracet for more severe pain; indications and precautions discusses.    She was educated on complimentary modalities to help with her pain.   Patient encouraged to rest, ice, and elevate her knee. Ice should be applied TID-QID for at least 15-20 minutes at a time; written information provided on today's AVS. She was provided with new (dry) ice packs for use at home.    Ideally, I would like for patient to be in a brace/sleeve for stability and comfort, however I do not have one available today in her size today in clinc. Discussed that she could obtain a knee sleeve at the pharmacy when she picks up her medications today. Recommended WBAT.     Patient to trial conservative  management for the next week. If no improvement, patient to follow up with PCP Wynetta Emery, MD) to discuss referral to orthopedics for further management and treatment as deemed appropriate.   I have reviewed the follow up and strict return precautions for any new or worsening symptoms. Patient is aware of symptoms that would be deemed urgent/emergent, and would thus require further evaluation either here or in the emergency department. At the time of discharge, she verbalized understanding and consent with the discharge plan as it was reviewed with her. All questions  were fielded by provider and/or clinic staff prior to patient discharge.    ADDENDUM: Received call from pharmacy advising that Ultracet was not available and would have to be ordered. Will change ordered intervention to plain tramadol TID PRN (Disp #12). Patient is aware of change.   Final Clinical Impressions / Urgent Care Diagnoses:   Final diagnoses:  Acute pain of right knee  Abrasion of right knee, initial encounter  Accidental fall, initial encounter    New Prescriptions:  [place] Controlled Substance Registry consulted? Yes, I have consulted the [place] Controlled Substances Registry for this patient, and feel the risk/benefit ratio today is favorable for proceeding with this prescription for a controlled substance.  . Discussed use of controlled substance medication to treat her acute pain.  o Reviewed [place] STOP Act regulations  o Clinic does not refill controlled substances over the phone without face to face evaluation.  . Safety precautions reviewed.  o Medications should not be bitten, chewed, sold, or taken with alcohol.  o Avoid use while working, driving, or operating heavy machinery.  o Side effects associated with the use of this particular medication reviewed. - Patient understands that this medication can cause CNS depression, increase her risk of falls, and even lead to overdose that may result in death, if used outside of  the parameters that she and I discussed.  With all of this in mind, she knowingly accepts the risks and responsibilities associated with intended course of treatment, and elects to responsibly proceed as discussed.  Meds ordered this encounter  Medications  . bacitracin ointment  . meloxicam (MOBIC) 15 MG tablet    Sig: Take 1 tablet (15 mg total) by mouth daily.    Dispense:  30 tablet    Refill:  0  . DISCONTD: traMADol-acetaminophen (ULTRACET) 37.5-325 MG tablet    Sig: Take 1 tablet by mouth 3 (three) times daily as needed.    Dispense:  12 tablet    Refill:  0  . traMADol (ULTRAM) 50 MG tablet    Sig: Take 1 tablet (50 mg total) by mouth 3 (three) times daily as needed.    Dispense:  12 tablet    Refill:  0    DISCONTINUE ultracet Rx; not available at pharmacy.    Recommended Follow up Care:  Patient encouraged to follow up with the following provider within the specified time frame, or sooner as dictated by the severity of her symptoms. As always, she was instructed that for any urgent/emergent care needs, she should seek care either here or in the emergency department for more immediate evaluation.  Follow-up Information    Laural Benes, Megan P, DO In 1 week.   Specialty: Family Medicine Why: General reassessment of symptoms if not improving Contact information: [address] [place] [address] [phone]         NOTE: This note was prepared using Dragon dictation software along with smaller phrase technology. Despite my best ability to proofread, there is the potential that transcriptional errors may still occur from this process, and are completely unintentional.   Verlee Monte, NP 12/31/19 2246

## 2020-01-03 ENCOUNTER — Encounter: Payer: No Typology Code available for payment source | Admitting: Physical Therapy

## 2020-01-06 ENCOUNTER — Other Ambulatory Visit: Payer: Self-pay

## 2020-01-06 ENCOUNTER — Encounter: Payer: Self-pay | Admitting: Licensed Clinical Social Worker

## 2020-01-06 ENCOUNTER — Telehealth (INDEPENDENT_AMBULATORY_CARE_PROVIDER_SITE_OTHER): Payer: No Typology Code available for payment source | Admitting: Licensed Clinical Social Worker

## 2020-01-06 DIAGNOSIS — F411 Generalized anxiety disorder: Secondary | ICD-10-CM

## 2020-01-06 DIAGNOSIS — F3341 Major depressive disorder, recurrent, in partial remission: Secondary | ICD-10-CM
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
## 2020-01-10 ENCOUNTER — Encounter: Payer: No Typology Code available for payment source | Admitting: Physical Therapy

## 2020-01-12 ENCOUNTER — Other Ambulatory Visit: Payer: Self-pay

## 2020-01-12 ENCOUNTER — Other Ambulatory Visit: Payer: Self-pay | Admitting: Family Medicine

## 2020-01-12 ENCOUNTER — Ambulatory Visit (INDEPENDENT_AMBULATORY_CARE_PROVIDER_SITE_OTHER): Payer: No Typology Code available for payment source | Admitting: Family Medicine

## 2020-01-12 ENCOUNTER — Encounter: Payer: Self-pay | Admitting: Family Medicine

## 2020-01-12 VITALS — BP 133/89 | HR 71 | Temp 97.4°F | Wt 221.2 lb

## 2020-01-12 DIAGNOSIS — F419 Anxiety disorder, unspecified: Secondary | ICD-10-CM

## 2020-01-12 DIAGNOSIS — G4701 Insomnia due to medical condition: Secondary | ICD-10-CM

## 2020-01-12 DIAGNOSIS — F331 Major depressive disorder, recurrent, moderate: Secondary | ICD-10-CM | POA: Diagnosis not present

## 2020-01-12 DIAGNOSIS — G43009 Migraine without aura, not intractable, without status migrainosus: Secondary | ICD-10-CM | POA: Diagnosis not present

## 2020-01-12 DIAGNOSIS — N183 Chronic kidney disease, stage 3 unspecified: Secondary | ICD-10-CM

## 2020-01-12 DIAGNOSIS — E559 Vitamin D deficiency, unspecified: Secondary | ICD-10-CM

## 2020-01-12 DIAGNOSIS — R7303 Prediabetes: Secondary | ICD-10-CM

## 2020-01-12 DIAGNOSIS — M2559 Pain in other specified joint: Secondary | ICD-10-CM

## 2020-01-12 DIAGNOSIS — E7849 Other hyperlipidemia: Secondary | ICD-10-CM

## 2020-01-12 DIAGNOSIS — E538 Deficiency of other specified B group vitamins: Secondary | ICD-10-CM

## 2020-01-12 LAB — BAYER DCA HB A1C WAIVED: HB A1C (BAYER DCA - WAIVED): 5.5 % (ref <7.0)

## 2020-01-12 MED ORDER — ATORVASTATIN CALCIUM 40 MG PO TABS: 40.0000 mg | ORAL_TABLET | Freq: Every day | ORAL | 1 refills | Status: DC

## 2020-01-12 MED ORDER — TRAZODONE HCL 100 MG PO TABS: 50.0000 mg | ORAL_TABLET | Freq: Every evening | ORAL | 1 refills | Status: DC | PRN

## 2020-01-12 MED ORDER — RIZATRIPTAN BENZOATE 10 MG PO TABS: 10.0000 mg | ORAL_TABLET | ORAL | 1 refills | Status: DC | PRN

## 2020-01-12 MED ORDER — BUPROPION HCL ER (XL) 300 MG PO TB24: 300.0000 mg | ORAL_TABLET | Freq: Every day | ORAL | 1 refills | Status: DC

## 2020-01-12 NOTE — Patient Instructions (Signed)
Liraglutide injection (Weight Management) What is this medicine? LIRAGLUTIDE (LIR a GLOO tide) is used to help people lose weight and maintain weight loss. It is used with a reduced-calorie diet and exercise. This medicine may be used for other purposes; ask your health care provider or pharmacist if you have questions. COMMON BRAND NAME(S): Saxenda What should I tell my health care provider before I take this medicine? They need to know if you have any of these conditions:  endocrine tumors (MEN 2) or if someone in your family had these tumors  gallbladder disease  high cholesterol  history of alcohol abuse problem  history of pancreatitis  kidney disease or if you are on dialysis  liver disease  previous swelling of the tongue, face, or lips with difficulty breathing, difficulty swallowing, hoarseness, or tightening of the throat  stomach problems  suicidal thoughts, plans, or attempt; a previous suicide attempt by you or a family member  thyroid cancer or if someone in your family had thyroid cancer  an unusual or allergic reaction to liraglutide, other medicines, foods, dyes, or preservatives  pregnant or trying to get pregnant  breast-feeding How should I use this medicine? This medicine is for injection under the skin of your upper leg, stomach area, or upper arm. You will be taught how to prepare and give this medicine. Use exactly as directed. Take your medicine at regular intervals. Do not take it more often than directed. This drug comes with INSTRUCTIONS FOR USE. Ask your pharmacist for directions on how to use this drug. Read the information carefully. Talk to your pharmacist or health care provider if you have questions. It is important that you put your used needles and syringes in a special sharps container. Do not put them in a trash can. If you do not have a sharps container, call your pharmacist or healthcare provider to get one. A special MedGuide will be  given to you by the pharmacist with each prescription and refill. Be sure to read this information carefully each time. Talk to your pediatrician regarding the use of this medicine in children. Special care may be needed. Overdosage: If you think you have taken too much of this medicine contact a poison control center or emergency room at once. NOTE: This medicine is only for you. Do not share this medicine with others. What if I miss a dose? If you miss a dose, take it as soon as you can. If it is almost time for your next dose, take only that dose. Do not take double or extra doses. If you miss your dose for 3 days or more, call your doctor or health care professional to talk about how to restart this medicine. What may interact with this medicine?  insulin and other medicines for diabetes This list may not describe all possible interactions. Give your health care provider a list of all the medicines, herbs, non-prescription drugs, or dietary supplements you use. Also tell them if you smoke, drink alcohol, or use illegal drugs. Some items may interact with your medicine. What should I watch for while using this medicine? Visit your doctor or health care professional for regular checks on your progress. Drink plenty of fluids while taking this medicine. Check with your doctor or health care professional if you get an attack of severe diarrhea, nausea, and vomiting. The loss of too much body fluid can make it dangerous for you to take this medicine. This medicine may affect blood sugar levels. Ask your healthcare   provider if changes in diet or medicines are needed if you have diabetes. Patients and their families should watch out for worsening depression or thoughts of suicide. Also watch out for sudden changes in feelings such as feeling anxious, agitated, panicky, irritable, hostile, aggressive, impulsive, severely restless, overly excited and hyperactive, or not being able to sleep. If this happens,  especially at the beginning of treatment or after a change in dose, call your health care professional. Women should inform their health care provider if they wish to become pregnant or think they might be pregnant. Losing weight while pregnant is not advised and may cause harm to the unborn child. Talk to your health care provider for more information. What side effects may I notice from receiving this medicine? Side effects that you should report to your doctor or health care professional as soon as possible:  allergic reactions like skin rash, itching or hives, swelling of the face, lips, or tongue  breathing problems  diarrhea that continues or is severe  lump or swelling on the neck  severe nausea  signs and symptoms of infection like fever or chills; cough; sore throat; pain or trouble passing urine  signs and symptoms of low blood sugar such as feeling anxious; confusion; dizziness; increased hunger; unusually weak or tired; increased sweating; shakiness; cold, clammy skin; irritable; headache; blurred vision; fast heartbeat; loss of consciousness  signs and symptoms of kidney injury like trouble passing urine or change in the amount of urine  trouble swallowing  unusual stomach upset or pain  vomiting Side effects that usually do not require medical attention (report to your doctor or health care professional if they continue or are bothersome):  constipation  decreased appetite  diarrhea  fatigue  headache  nausea  pain, redness, or irritation at site where injected  stomach upset  stuffy or runny nose This list may not describe all possible side effects. Call your doctor for medical advice about side effects. You may report side effects to FDA at 1-800-FDA-1088. Where should I keep my medicine? Keep out of the reach of children. Store unopened pen in a refrigerator between 2 and 8 degrees C (36 and 46 degrees F). Do not freeze or use if the medicine has been  frozen. Protect from light and excessive heat. After you first use the pen, it can be stored at room temperature between 15 and 30 degrees C (59 and 86 degrees F) or in a refrigerator. Throw away your used pen after 30 days or after the expiration date, whichever comes first. Do not store your pen with the needle attached. If the needle is left on, medicine may leak from the pen. NOTE: This sheet is a summary. It may not cover all possible information. If you have questions about this medicine, talk to your doctor, pharmacist, or health care provider.  2020 Elsevier/Gold Standard (2019-07-14 21:16:59)  
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
## 2020-01-14 LAB — COMPREHENSIVE METABOLIC PANEL
ALT: 13 IU/L (ref 0–32)
AST: 16 IU/L (ref 0–40)
Albumin/Globulin Ratio: 1.9 (ref 1.2–2.2)
Albumin: 4.4 g/dL (ref 3.8–4.9)
Alkaline Phosphatase: 59 IU/L (ref 39–117)
BUN/Creatinine Ratio: 18 (ref 9–23)
BUN: 18 mg/dL (ref 6–24)
Bilirubin Total: 0.4 mg/dL (ref 0.0–1.2)
CO2: 25 mmol/L (ref 20–29)
Calcium: 9.3 mg/dL (ref 8.7–10.2)
Chloride: 100 mmol/L (ref 96–106)
Creatinine, Ser: 0.99 mg/dL (ref 0.57–1.00)
GFR calc Af Amer: 76 mL/min/{1.73_m2} (ref >59)
GFR calc non Af Amer: 66 mL/min/{1.73_m2} (ref >59)
Globulin, Total: 2.3 g/dL (ref 1.5–4.5)
Glucose: 109 mg/dL — ABNORMAL HIGH (ref 65–99)
Potassium: 4.2 mmol/L (ref 3.5–5.2)
Sodium: 138 mmol/L (ref 134–144)
Total Protein: 6.7 g/dL (ref 6.0–8.5)

## 2020-01-14 LAB — CBC WITH DIFFERENTIAL/PLATELET
Basophils Absolute: 0.1 10*3/uL (ref 0.0–0.2)
Basos: 1 %
EOS (ABSOLUTE): 0.2 10*3/uL (ref 0.0–0.4)
Eos: 2 %
Hematocrit: 40.2 % (ref 34.0–46.6)
Hemoglobin: 14.1 g/dL (ref 11.1–15.9)
Immature Grans (Abs): 0 10*3/uL (ref 0.0–0.1)
Immature Granulocytes: 0 %
Lymphocytes Absolute: 1.8 10*3/uL (ref 0.7–3.1)
Lymphs: 19 %
MCH: 32.8 pg (ref 26.6–33.0)
MCHC: 35.1 g/dL (ref 31.5–35.7)
MCV: 94 fL (ref 79–97)
Monocytes Absolute: 0.5 10*3/uL (ref 0.1–0.9)
Monocytes: 6 %
Neutrophils Absolute: 6.8 10*3/uL (ref 1.4–7.0)
Neutrophils: 72 %
Platelets: 285 10*3/uL (ref 150–450)
RBC: 4.3 x10E6/uL (ref 3.77–5.28)
RDW: 13.1 % (ref 11.7–15.4)
WBC: 9.4 10*3/uL (ref 3.4–10.8)

## 2020-01-14 LAB — VITAMIN B12: Vitamin B-12: 235 pg/mL (ref 232–1245)

## 2020-01-14 LAB — CK: Total CK: 72 U/L (ref 32–182)

## 2020-01-14 LAB — URIC ACID: Uric Acid: 4 mg/dL (ref 3.0–7.2)

## 2020-01-14 LAB — TSH: TSH: 1.13 u[IU]/mL (ref 0.450–4.500)

## 2020-01-14 LAB — LIPID PANEL W/O CHOL/HDL RATIO
Cholesterol, Total: 185 mg/dL (ref 100–199)
HDL: 58 mg/dL (ref >39)
LDL Chol Calc (NIH): 98 mg/dL (ref 0–99)
Triglycerides: 172 mg/dL — ABNORMAL HIGH (ref 0–149)
VLDL Cholesterol Cal: 29 mg/dL (ref 5–40)

## 2020-01-14 LAB — VITAMIN D 25 HYDROXY (VIT D DEFICIENCY, FRACTURES): Vit D, 25-Hydroxy: 34.4 ng/mL (ref 30.0–100.0)

## 2020-01-14 LAB — RA QN+CCP(IGG/A)+SJOSSA+SJOSSB
Cyclic Citrullin Peptide Ab: 3 units (ref 0–19)
ENA SSA (RO) Ab: <0.2 AI (ref 0.0–0.9)
ENA SSB (LA) Ab: <0.2 AI (ref 0.0–0.9)
Rheumatoid fact SerPl-aCnc: <10 IU/mL (ref 0.0–13.9)

## 2020-01-14 LAB — SEDIMENTATION RATE: Sed Rate: 11 mm/hr (ref 0–40)

## 2020-01-14 LAB — ANA: Anti Nuclear Antibody (ANA): NEGATIVE

## 2020-01-15 ENCOUNTER — Encounter: Payer: Self-pay | Admitting: Family Medicine

## 2020-01-15 NOTE — Assessment & Plan Note (Signed)
Checking labs today. Await results. Continue to monitor.  

## 2020-01-15 NOTE — Assessment & Plan Note (Signed)
Under good control on current regimen. Continue current regimen. Continue to monitor. Call with any concerns. Refills given.   

## 2020-01-15 NOTE — Assessment & Plan Note (Signed)
Doing well on current regimen. Continue to monitor. Call with any concerns. Continue current regimen.

## 2020-01-15 NOTE — Assessment & Plan Note (Signed)
Rechecking levels today. Await results. Treat as needed.  

## 2020-01-15 NOTE — Assessment & Plan Note (Signed)
Doing well. Continuing to see her counselor. Would like [place] to take over her medication management. Discussed as long as she stays stable, I'm happy to do this. Continue to monitor.

## 2020-01-15 NOTE — Assessment & Plan Note (Signed)
Doing well. Continuing to see her counselor. Would like us to take over her medication management. Discussed as long as she stays stable, I'm happy to do this. Continue to monitor.  

## 2020-01-16 ENCOUNTER — Encounter: Payer: Self-pay | Admitting: Family Medicine

## 2020-01-17 ENCOUNTER — Encounter: Payer: No Typology Code available for payment source | Admitting: Physical Therapy

## 2020-01-20 ENCOUNTER — Ambulatory Visit: Payer: Self-pay | Admitting: *Deleted

## 2020-01-20 NOTE — Telephone Encounter (Signed)
Summary: Clinical Advice   Patient experiencing low grade fever, sore throat, cough, head ache, runny nose 4x and tested negative for COVID on Tuesday 4/27, patient seeking clinical advice for OTC. No availability today at PCP office      Attempted to call patient back regarding symptoms.

## 2020-01-20 NOTE — Telephone Encounter (Addendum)
Pt. Reports she started feeling bad Monday with cough, headache and fever. Tested negative for COVID 19. Still having fever on and off 102. Wheezing at times.Cough is "congested, but not coughing anything up." No availability in the practice today. Pt. Will go to UC. Reason for Disposition . [1] Continuous (nonstop) coughing interferes with work or school AND [2] no improvement using cough treatment per protocol  Answer Assessment - Initial Assessment Questions 1. ONSET: "When did the cough begin?"      Monday 2. SEVERITY: "How bad is the cough today?"      Moderate 3. RESPIRATORY DISTRESS: "Describe your breathing."      Mild 4. FEVER: "Do you have a fever?" If so, ask: "What is your temperature, how was it measured, and when did it start?"     Fever on and off 5. HEMOPTYSIS: "Are you coughing up any blood?" If so ask: "How much?" (flecks, streaks, tablespoons, etc.)     No 6. TREATMENT: "What have you done so far to treat the cough?" (e.g., meds, fluids, humidifier)     Advil 7. CARDIAC HISTORY: "Do you have any history of heart disease?" (e.g., heart attack, congestive heart failure)      No 8. LUNG HISTORY: "Do you have any history of lung disease?"  (e.g., pulmonary embolus, asthma, emphysema)     Asthma 9. PE RISK FACTORS: "Do you have a history of blood clots?" (or: recent major surgery, recent prolonged travel, bedridden)     No 10. OTHER SYMPTOMS: "Do you have any other symptoms? (e.g., runny nose, wheezing, chest pain)       Mild wheezing 11. PREGNANCY: "Is there any chance you are pregnant?" "When was your last menstrual period?"       No 12. TRAVEL: "Have you traveled out of the country in the last month?" (e.g., travel history, exposures)       No  Protocols used: COUGH - ACUTE NON-PRODUCTIVE-A-AH

## 2020-01-26 ENCOUNTER — Telehealth (INDEPENDENT_AMBULATORY_CARE_PROVIDER_SITE_OTHER): Payer: No Typology Code available for payment source | Admitting: Family Medicine

## 2020-01-26 ENCOUNTER — Encounter: Payer: Self-pay | Admitting: Family Medicine

## 2020-01-26 DIAGNOSIS — J0100 Acute maxillary sinusitis, unspecified: Secondary | ICD-10-CM

## 2020-01-26 MED ORDER — DOXYCYCLINE HYCLATE 100 MG PO TABS: 100.0000 mg | ORAL_TABLET | Freq: Two times a day (BID) | ORAL | 0 refills | Status: DC

## 2020-01-26 NOTE — Progress Notes (Signed)
There were no vitals taken for this visit.   Subjective:    Patient ID: Emma Rogers, female    DOB: [birth year], 54 y.o.   MRN: [id number]  HPI: [name] is a 54 y.o. female  Chief Complaint  Patient presents with  . Nasal Congestion  . Cough  . Headache   UPPER RESPIRATORY TRACT INFECTION Duration: about a week, was tested for covid and it was negative Worst symptom: congestion, PND Fever: yes Cough: yes Shortness of breath: yes Wheezing: no Chest pain: no Chest tightness: no Chest congestion: no Nasal congestion: yes Runny nose: yes Post nasal drip: yes Sneezing: no Sore throat: no Swollen glands: no Sinus pressure: yes Headache: yes Face pain: no Toothache: no Ear pain: no  Ear pressure: no  Eyes red/itching:no Eye drainage/crusting: no  Vomiting: no Rash: no Fatigue: yes Sick contacts: no Strep contacts: no  Context: stable Recurrent sinusitis: no Relief with OTC cold/cough medications: no  Treatments attempted: mucinex and pseudoephedrine   Relevant past medical, surgical, family and social history reviewed and updated as indicated. Interim medical history since our last visit reviewed. Allergies and medications reviewed and updated.  Review of Systems  Constitutional: Positive for fatigue and fever. Negative for activity change, appetite change, chills, diaphoresis and unexpected weight change.  HENT: Positive for congestion, postnasal drip, rhinorrhea, sinus pressure and sinus pain. Negative for dental problem, drooling, ear discharge, ear pain, facial swelling, hearing loss, mouth sores, nosebleeds, sneezing, sore throat, tinnitus, trouble swallowing and voice change.   Eyes: Negative.   Respiratory: Positive for cough. Negative for apnea, choking, chest tightness, shortness of breath, wheezing and stridor.   Cardiovascular: Negative.   Gastrointestinal: Negative.   Psychiatric/Behavioral: Negative.     Per HPI unless specifically  indicated above     Objective:    There were no vitals taken for this visit.  Wt Readings from Last 3 Encounters:  01/12/20 221 lb 4 oz (100.4 kg)  12/30/19 205 lb (93 kg)  10/12/19 208 lb 12.8 oz (94.7 kg)    Physical Exam Vitals and nursing note reviewed.  Constitutional:      General: She is not in acute distress.    Appearance: Normal appearance. She is not ill-appearing, toxic-appearing or diaphoretic.  HENT:     Head: Normocephalic and atraumatic.     Right Ear: External ear normal.     Left Ear: External ear normal.     Nose: Nose normal.     Mouth/Throat:     Mouth: Mucous membranes are moist.     Pharynx: Oropharynx is clear.  Eyes:     General: No scleral icterus.       Right eye: No discharge.        Left eye: No discharge.     Conjunctiva/sclera: Conjunctivae normal.     Pupils: Pupils are equal, round, and reactive to light.  Pulmonary:     Effort: Pulmonary effort is normal. No respiratory distress.     Comments: Speaking in full sentences Musculoskeletal:        General: Normal range of motion.     Cervical back: Normal range of motion.  Skin:    Coloration: Skin is not jaundiced or pale.     Findings: No bruising, erythema, lesion or rash.  Neurological:     Mental Status: She is alert and oriented to person, place, and time. Mental status is at baseline.  Psychiatric:        Mood and  Affect: Mood normal.        Behavior: Behavior normal.        Thought Content: Thought content normal.        Judgment: Judgment normal.     Results for orders placed or performed in visit on 01/12/20  CBC with Differential/Platelet  Result Value Ref Range   WBC 9.4 3.4 - 10.8 x10E3/uL   RBC 4.30 3.77 - 5.28 x10E6/uL   Hemoglobin 14.1 11.1 - 15.9 g/dL   Hematocrit 40.2 34.0 - 46.6 %   MCV 94 79 - 97 fL   MCH 32.8 26.6 - 33.0 pg   MCHC 35.1 31.5 - 35.7 g/dL   RDW 13.1 11.7 - 15.4 %   Platelets 285 150 - 450 x10E3/uL   Neutrophils 72 Not Estab. %   Lymphs 19  Not Estab. %   Monocytes 6 Not Estab. %   Eos 2 Not Estab. %   Basos 1 Not Estab. %   Neutrophils Absolute 6.8 1.4 - 7.0 x10E3/uL   Lymphocytes Absolute 1.8 0.7 - 3.1 x10E3/uL   Monocytes Absolute 0.5 0.1 - 0.9 x10E3/uL   EOS (ABSOLUTE) 0.2 0.0 - 0.4 x10E3/uL   Basophils Absolute 0.1 0.0 - 0.2 x10E3/uL   Immature Granulocytes 0 Not Estab. %   Immature Grans (Abs) 0.0 0.0 - 0.1 x10E3/uL  Comprehensive metabolic panel  Result Value Ref Range   Glucose 109 (H) 65 - 99 mg/dL   BUN 18 6 - 24 mg/dL   Creatinine, Ser 0.99 0.57 - 1.00 mg/dL   GFR calc non Af Amer 66 >59 mL/min/1.73   GFR calc Af Amer 76 >59 mL/min/1.73   BUN/Creatinine Ratio 18 9 - 23   Sodium 138 134 - 144 mmol/L   Potassium 4.2 3.5 - 5.2 mmol/L   Chloride 100 96 - 106 mmol/L   CO2 25 20 - 29 mmol/L   Calcium 9.3 8.7 - 10.2 mg/dL   Total Protein 6.7 6.0 - 8.5 g/dL   Albumin 4.4 3.8 - 4.9 g/dL   Globulin, Total 2.3 1.5 - 4.5 g/dL   Albumin/Globulin Ratio 1.9 1.2 - 2.2   Bilirubin Total 0.4 0.0 - 1.2 mg/dL   Alkaline Phosphatase 59 39 - 117 IU/L   AST 16 0 - 40 IU/L   ALT 13 0 - 32 IU/L  Lipid Panel w/o Chol/HDL Ratio  Result Value Ref Range   Cholesterol, Total 185 100 - 199 mg/dL   Triglycerides 172 (H) 0 - 149 mg/dL   HDL 58 >39 mg/dL   VLDL Cholesterol Cal 29 5 - 40 mg/dL   LDL Chol Calc (NIH) 98 0 - 99 mg/dL  TSH  Result Value Ref Range   TSH 1.130 0.450 - 4.500 uIU/mL  RA Qn+CCP(IgG/A)+SjoSSA+SjoSSB  Result Value Ref Range   Rhuematoid fact SerPl-aCnc <10.0 0.0 - 13.9 IU/mL   ENA SSA (RO) Ab <0.2 0.0 - 0.9 AI   ENA SSB (LA) Ab <0.2 0.0 - 0.9 AI   Cyclic Citrullin Peptide Ab 3 0 - 19 units  VITAMIN D 25 Hydroxy (Vit-D Deficiency, Fractures)  Result Value Ref Range   Vit D, 25-Hydroxy 34.4 30.0 - 100.0 ng/mL  Uric acid  Result Value Ref Range   Uric Acid 4.0 3.0 - 7.2 mg/dL  Sed Rate (ESR)  Result Value Ref Range   Sed Rate 11 0 - 40 mm/hr  Antinuclear Antib (ANA)  Result Value Ref Range   Anti  Nuclear Antibody (ANA) Negative Negative  CK (Creatine  Kinase)  Result Value Ref Range   Total CK 72 32 - 182 U/L  Bayer DCA Hb A1c Waived  Result Value Ref Range   HB A1C (BAYER DCA - WAIVED) 5.5 <7.0 %  B12  Result Value Ref Range   Vitamin B-12 235 232 - 1,245 pg/mL      Assessment & Plan:   Problem List Items Addressed This Visit    None    Visit Diagnoses    Acute non-recurrent maxillary sinusitis    -  Primary   Will treat with doxycycline. Call if not getting better or getting worse. Continue to monitor.    Relevant Medications   doxycycline (VIBRA-TABS) 100 MG tablet       Follow up plan: Return if symptoms worsen or fail to improve.    . This visit was completed via MyChart due to the restrictions of the COVID-19 pandemic. All issues as above were discussed and addressed. Physical exam was done as above through visual confirmation on MyChart. If it was felt that the patient should be evaluated in the office, they were directed there. The patient verbally consented to this visit. . Location of the patient: work . Location of the provider: work . Those involved with this call:  . Provider: Park Liter, DO . CMA: Tiffany Reel, CMA . Front Desk/Registration: Don Perking  . Time spent on call: 15 minutes with patient face to face via video conference. More than 50% of this time was spent in counseling and coordination of care. 23 minutes total spent in review of patient's record and preparation of their chart.

## 2020-02-13 ENCOUNTER — Ambulatory Visit: Payer: No Typology Code available for payment source | Admitting: Neurology

## 2020-02-29 ENCOUNTER — Telehealth: Payer: Self-pay

## 2020-02-29 NOTE — Telephone Encounter (Signed)
Spoke with patient to remind of missed remote transmission 

## 2020-03-14 ENCOUNTER — Telehealth: Payer: No Typology Code available for payment source | Admitting: Family

## 2020-03-14 DIAGNOSIS — J4541 Moderate persistent asthma with (acute) exacerbation: Secondary | ICD-10-CM

## 2020-03-14 MED ORDER — PREDNISONE 20 MG PO TABS: 40.0000 mg | ORAL_TABLET | Freq: Every day | ORAL | 0 refills | Status: AC

## 2020-03-14 NOTE — Progress Notes (Signed)
Visit for Asthma  Based on what you have shared with me, it looks like you may have a flare up of your asthma.  Asthma is a chronic (ongoing) lung disease which results in airway obstruction, inflammation and hyper-responsiveness.   Asthma symptoms vary from person to person, with common symptoms including nighttime awakening and decreased ability to participate in normal activities as a result of shortness of breath. It is often triggered by changes in weather, changes in the season, changes in air temperature, or inside (home, school, daycare or work) allergens such as animal dander, mold, mildew, woodstoves or cockroaches.   It can also be triggered by hormonal changes, extreme emotion, physical exertion or an upper respiratory tract illness.     It is important to identify the trigger, and then eliminate or avoid the trigger if possible.   If you have been prescribed medications to be taken on a regular basis, it is important to follow the asthma action plan and to follow guidelines to adjust medication in response to increasing symptoms of decreased peak expiratory flow rate  Treatment: I have prescribed: Prednisone 40mg  by mouth per day for 5  days  HOME CARE . Only take medications as instructed by your medical team. . Consider wearing a mask or scarf to improve breathing air temperature have been shown to decrease irritation and decrease exacerbations . Get rest. . Taking a steamy shower or using a humidifier may help nasal congestion sand ease sore throat pain. You can place a towel over your head and breathe in the steam from hot water coming from a faucet. . Using a saline nasal spray works much the same way.  . Cough drops, hare candies and sore throat lozenges may ease your cough.  . Avoid close contacts especially the very you and the elderly . Cover your mouth if you  cough or sneeze . Always remember to wash your hands.    GET HELP RIGHT AWAY IF: . You develop worsening symptoms; breathlessness at rest, drowsy, confused or agitated, unable to speak in full sentences . You have coughing fits . You develop a severe headache or visual changes . You develop shortness of breath, difficulty breathing or start having chest pain . Your symptoms persist after you have completed your treatment plan . If your symptoms do not improve within 10 days  MAKE SURE YOU . Understand these instructions. . Will watch your condition. . Will get help right away if you are not doing well or get worse.   Your e-visit answers were reviewed by a board certified advanced clinical practitioner to complete your personal care plan, Depending upon the condition, your plan could have included both over the counter or prescription medications.  Please review your pharmacy choice. Your safety is important to . If you have drug allergies check your prescription carefully. You can use MyChart to ask questions about today's visit, request a non-urgent call back, or ask for a work or school excuse for 24 hours related to this e-Visit. If it has been greater than 24 hours you will need to follow up with your provider, or enter a new e-Visit to address those concerns.  You will get an e-mail in the next two days asking about your experience. I hope that your e-visit has been valuable and will speed your recovery. Thank you for using e-visits.  Approximately 5 minutes was spent documenting and reviewing patient's chart.

## 2020-03-15 ENCOUNTER — Telehealth: Payer: No Typology Code available for payment source | Admitting: Physician Assistant

## 2020-03-15 DIAGNOSIS — N3000 Acute cystitis without hematuria: Secondary | ICD-10-CM

## 2020-03-15 MED ORDER — CEPHALEXIN 500 MG PO CAPS: 500.0000 mg | ORAL_CAPSULE | Freq: Two times a day (BID) | ORAL | 0 refills | Status: AC

## 2020-03-15 NOTE — Progress Notes (Signed)

## 2020-03-19 ENCOUNTER — Ambulatory Visit (INDEPENDENT_AMBULATORY_CARE_PROVIDER_SITE_OTHER): Payer: No Typology Code available for payment source | Admitting: Family Medicine

## 2020-03-19 ENCOUNTER — Encounter: Payer: Self-pay | Admitting: Family Medicine

## 2020-03-19 ENCOUNTER — Other Ambulatory Visit: Payer: Self-pay

## 2020-03-19 VITALS — BP 120/81 | HR 70 | Temp 98.5°F | Wt 226.0 lb

## 2020-03-19 DIAGNOSIS — R350 Frequency of micturition: Secondary | ICD-10-CM

## 2020-03-19 LAB — MICROSCOPIC EXAMINATION
Bacteria, UA: NONE SEEN
WBC, UA: NONE SEEN /hpf (ref 0–5)

## 2020-03-19 LAB — UA/M W/RFLX CULTURE, ROUTINE

## 2020-03-19 MED ORDER — SULFAMETHOXAZOLE-TRIMETHOPRIM 800-160 MG PO TABS: 1.0000 | ORAL_TABLET | Freq: Two times a day (BID) | ORAL | 0 refills | Status: DC

## 2020-03-19 MED ORDER — PHENAZOPYRIDINE HCL 200 MG PO TABS: 200.0000 mg | ORAL_TABLET | Freq: Three times a day (TID) | ORAL | 0 refills | Status: DC | PRN

## 2020-03-19 NOTE — Progress Notes (Signed)
BP 120/81   Pulse 70   Temp 98.5 F (36.9 C) (Oral)   Wt 226 lb (102.5 kg)   SpO2 97%   BMI 36.48 kg/m    Subjective:    Patient ID: Emma Rogers, female    DOB: 1966-07-21, 54 y.o.   MRN: [id number]  HPI: ELMO SHUMARD is a 54 y.o. female  Chief Complaint  Patient presents with  . Abdominal Pain    low abdomen since last Tuesday. did e-visit, given 500 mg keflex. not helping  . Urinary Frequency   Presenting today following up on lower abdominal pressure and burning, urinary urgency, left flank pain that's been ongoing for about 6 days. Did an e visit about 4 days ago and was started on keflex, not feeling any better. Denies fevers, chills, sweats, N/V, hematuria. Trying to drink plenty of fluids.   Relevant past medical, surgical, family and social history reviewed and updated as indicated. Interim medical history since our last visit reviewed. Allergies and medications reviewed and updated.  Review of Systems  Per HPI unless specifically indicated above     Objective:    BP 120/81   Pulse 70   Temp 98.5 F (36.9 C) (Oral)   Wt 226 lb (102.5 kg)   SpO2 97%   BMI 36.48 kg/m   Wt Readings from Last 3 Encounters:  03/19/20 226 lb (102.5 kg)  01/12/20 221 lb 4 oz (100.4 kg)  12/30/19 205 lb (93 kg)    Physical Exam Vitals and nursing note reviewed.  Constitutional:      Appearance: Normal appearance. She is not ill-appearing.  HENT:     Head: Atraumatic.  Eyes:     Extraocular Movements: Extraocular movements intact.     Conjunctiva/sclera: Conjunctivae normal.  Cardiovascular:     Rate and Rhythm: Normal rate and regular rhythm.     Heart sounds: Normal heart sounds.  Pulmonary:     Effort: Pulmonary effort is normal.     Breath sounds: Normal breath sounds.  Abdominal:     General: Bowel sounds are normal. There is no distension.     Palpations: Abdomen is soft.     Tenderness: There is no abdominal tenderness. There is no right CVA tenderness,  left CVA tenderness or guarding.  Musculoskeletal:        General: Normal range of motion.     Cervical back: Normal range of motion and neck supple.  Skin:    General: Skin is warm and dry.  Neurological:     Mental Status: She is alert and oriented to person, place, and time.  Psychiatric:        Mood and Affect: Mood normal.        Thought Content: Thought content normal.        Judgment: Judgment normal.     Results for orders placed or performed in visit on 03/19/20  Microscopic Examination   Urine  Result Value Ref Range   WBC, UA None seen 0 - 5 /hpf   RBC 0-2 0 - 2 /hpf   Epithelial Cells (non renal) 0-10 0 - 10 /hpf   Casts Present None seen /lpf   Cast Type Hyaline casts N/A   Mucus, UA Present Not Estab.   Bacteria, UA None seen None seen/Few  UA/M w/rflx Culture, Routine   Specimen: Urine   Urine  Result Value Ref Range   Color, UA Orange Yellow   Appearance Ur Clear Clear   Microscopic  Examination See below:       Assessment & Plan:   Problem List Items Addressed This Visit    None    Visit Diagnoses    Urinary frequency    -  Primary   U/A benign today but already on abx. Will switch to bactrim given poor improvement in sxs, start pyridium prn and monitor closely for improvement   Relevant Orders   UA/M w/rflx Culture, Routine (Completed)       Follow up plan: Return if symptoms worsen or fail to improve.

## 2020-06-07 ENCOUNTER — Ambulatory Visit: Payer: No Typology Code available for payment source | Admitting: Unknown Physician Specialty

## 2020-06-20 ENCOUNTER — Encounter: Payer: Self-pay | Admitting: Family Medicine

## 2020-06-21 ENCOUNTER — Other Ambulatory Visit: Payer: Self-pay | Admitting: Family Medicine

## 2020-06-21 MED ORDER — FUROSEMIDE 20 MG PO TABS: 20.0000 mg | ORAL_TABLET | Freq: Every day | ORAL | 6 refills | Status: DC

## 2020-07-26 ENCOUNTER — Telehealth (INDEPENDENT_AMBULATORY_CARE_PROVIDER_SITE_OTHER): Payer: No Typology Code available for payment source | Admitting: Nurse Practitioner

## 2020-07-26 ENCOUNTER — Telehealth: Payer: Self-pay

## 2020-07-26 ENCOUNTER — Other Ambulatory Visit: Payer: Self-pay | Admitting: Nurse Practitioner

## 2020-07-26 ENCOUNTER — Encounter: Payer: Self-pay | Admitting: Nurse Practitioner

## 2020-07-26 DIAGNOSIS — R059 Cough, unspecified: Secondary | ICD-10-CM

## 2020-07-26 DIAGNOSIS — J9801 Acute bronchospasm: Secondary | ICD-10-CM | POA: Diagnosis not present

## 2020-07-26 MED ORDER — GUAIFENESIN-CODEINE 100-10 MG/5ML PO SOLN: 5.0000 mL | Freq: Two times a day (BID) | ORAL | 0 refills | Status: DC | PRN

## 2020-07-26 MED ORDER — PREDNISONE 20 MG PO TABS: 40.0000 mg | ORAL_TABLET | Freq: Every day | ORAL | 0 refills | Status: AC

## 2020-07-26 MED ORDER — AZITHROMYCIN 250 MG PO TABS: ORAL_TABLET | ORAL | 0 refills | Status: DC

## 2020-07-26 MED ORDER — PROAIR RESPICLICK 108 (90 BASE) MCG/ACT IN AEPB: 1.0000 | INHALATION_SPRAY | RESPIRATORY_TRACT | 2 refills | Status: DC | PRN

## 2020-07-26 MED ORDER — ALBUTEROL SULFATE HFA 108 (90 BASE) MCG/ACT IN AERS: 2.0000 | INHALATION_SPRAY | Freq: Four times a day (QID) | RESPIRATORY_TRACT | 2 refills | Status: DC | PRN

## 2020-07-26 NOTE — Progress Notes (Signed)
Temp 98.7 F (37.1 C) (Oral)    Subjective:    Patient ID: Emma Rogers, female    DOB: 22-Jul-1966, 54 y.o.   MRN: [id number]  HPI: Emma Rogers is a 55 y.o. female  Chief Complaint  Patient presents with   Cough    x 2 weeks, tested for covid on 10/28 and was negative. Persistent cough, drainage from nose chest congestions, sounds hoarse, woke several times last night with coughing spells.     This visit was completed via MyChart due to the restrictions of the COVID-19 pandemic. All issues as above were discussed and addressed. Physical exam was done as above through visual confirmation on MyChart. If it was felt that the patient should be evaluated in the office, they were directed there. The patient verbally consented to this visit.  Location of the patient: home  Location of the provider: work  Those involved with this call:   Provider: Aura Dials, DNP  CMA: Wilhemena Durie, CMA  Front Desk/Registration: Adela Ports   Time spent on call: 20 minutes with patient face to face via video conference. More than 50% of this time was spent in counseling and coordination of care. 15 minutes total spent in review of patient's record and preparation of their chart.   I verified patient identity using two factors (patient name and date of birth). Patient consents verbally to being seen via telemedicine visit today.    UPPER RESPIRATORY TRACT INFECTION Presents today for symptoms that started last Monday 07/16/20, tested negative for Covid on 07/19/20.  Presents with persistent cough, nasal drainage, chest congestion, hoarseness.  Waking up several times at night due to cough.  Was vaccinated for Covid in April, last on 01/20/20.   Fever: no Cough: yes -- coughing up yellow Shortness of breath: no Wheezing: yes Chest pain: no Chest tightness: yes Chest congestion: yes Nasal congestion: yes Runny nose: yes Post nasal drip: no Sneezing: no Sore throat:  no Swollen glands: no Sinus pressure: no Headache: no Face pain: no Toothache: no Ear pain: none Ear pressure: none Eyes red/itching:no Eye drainage/crusting: no  Vomiting: no Rash: no Fatigue: yes Sick contacts: no Strep contacts: no  Context: stable Recurrent sinusitis: no Relief with OTC cold/cough medications: no  Treatments attempted: cold/sinus, mucinex and cough syrup   Relevant past medical, surgical, family and social history reviewed and updated as indicated. Interim medical history since our last visit reviewed. Allergies and medications reviewed and updated.  Review of Systems  Constitutional: Positive for fatigue. Negative for activity change, appetite change, chills and fever.  HENT: Positive for congestion and rhinorrhea. Negative for ear discharge, ear pain, facial swelling, postnasal drip, sinus pressure, sinus pain, sneezing, sore throat and voice change.   Eyes: Negative for pain and visual disturbance.  Respiratory: Positive for cough, chest tightness and wheezing. Negative for shortness of breath.   Cardiovascular: Negative for chest pain, palpitations and leg swelling.  Gastrointestinal: Negative for abdominal distention, abdominal pain, constipation, diarrhea, nausea and vomiting.  Endocrine: Negative.   Musculoskeletal: Negative for myalgias.  Neurological: Negative for dizziness, numbness and headaches.  Psychiatric/Behavioral: Negative.     Per HPI unless specifically indicated above     Objective:    Temp 98.7 F (37.1 C) (Oral)   Wt Readings from Last 3 Encounters:  03/19/20 226 lb (102.5 kg)  01/12/20 221 lb 4 oz (100.4 kg)  12/30/19 205 lb (93 kg)    Physical Exam Vitals and nursing note reviewed.  Constitutional:      General: She is awake. She is not in acute distress.    Appearance: She is well-developed and well-groomed. She is not ill-appearing.  HENT:     Head: Normocephalic.     Right Ear: Hearing normal.     Left Ear:  Hearing normal.  Eyes:     General: Lids are normal.        Right eye: No discharge.        Left eye: No discharge.     Conjunctiva/sclera: Conjunctivae normal.  Pulmonary:     Effort: Pulmonary effort is normal. No accessory muscle usage or respiratory distress.     Comments: Unable to auscultate due to virtual exam only.  No SOB with talking.  Hoarseness noted. Musculoskeletal:     Cervical back: Normal range of motion.  Neurological:     Mental Status: She is alert and oriented to person, place, and time.  Psychiatric:        Attention and Perception: Attention normal.        Mood and Affect: Mood normal.        Speech: Speech normal.        Behavior: Behavior normal. Behavior is cooperative.        Thought Content: Thought content normal.    Results for orders placed or performed in visit on 03/19/20  Microscopic Examination   Urine  Result Value Ref Range   WBC, UA None seen 0 - 5 /hpf   RBC 0-2 0 - 2 /hpf   Epithelial Cells (non renal) 0-10 0 - 10 /hpf   Casts Present None seen /lpf   Cast Type Hyaline casts N/A   Mucus, UA Present Not Estab.   Bacteria, UA None seen None seen/Few  UA/M w/rflx Culture, Routine   Specimen: Urine   Urine  Result Value Ref Range   Color, UA Orange Yellow   Appearance Ur Clear Clear   Microscopic Examination See below:       Assessment & Plan:   Problem List Items Addressed This Visit      Other   Cough    Acute and ongoing for over 1 week with negative Covid testing and no improvement.  Has underlying asthma.  Will send in refill on her Proair and recommend she use this as needed for wheezing or SOB.  Scripts for Zpack, Prednisone, and Guaf-Codeine syrup sent.  Will plan on imaging if no improvement once treatment complete.  Recommend: - Increased rest - Increasing Fluids - Acetaminophen for fever/pain.  - Salt water gargling, chloraseptic spray and throat lozenges - Mucinex.  - Saline sinus flushes or a neti pot.  -  Humidifying the air Return to office as needed for worsening or ongoing symptoms.       Other Visit Diagnoses    Bronchospasm       Relevant Medications   Albuterol Sulfate (PROAIR RESPICLICK) 108 (90 Base) MCG/ACT AEPB      I discussed the assessment and treatment plan with the patient. The patient was provided an opportunity to ask questions and all were answered. The patient agreed with the plan and demonstrated an understanding of the instructions.   The patient was advised to call back or seek an in-person evaluation if the symptoms worsen or if the condition fails to improve as anticipated.   I provided 21+ minutes of time during this encounter.  Follow up plan: Return if symptoms worsen or fail to improve.

## 2020-07-26 NOTE — Telephone Encounter (Signed)
Pharmacy sent a fax stating that the Proair Respiclick is not covered. Ventolin is covered per pharmacy. Can we send this in instead?

## 2020-07-26 NOTE — Telephone Encounter (Signed)
Sent this in

## 2020-07-26 NOTE — Assessment & Plan Note (Signed)
Acute and ongoing for over 1 week with negative Covid testing and no improvement.  Has underlying asthma.  Will send in refill on her Proair and recommend she use this as needed for wheezing or SOB.  Scripts for Zpack, Prednisone, and Guaf-Codeine syrup sent.  Will plan on imaging if no improvement once treatment complete.  Recommend: - Increased rest - Increasing Fluids - Acetaminophen for fever/pain.  - Salt water gargling, chloraseptic spray and throat lozenges - Mucinex.  - Saline sinus flushes or a neti pot.  - Humidifying the air Return to office as needed for worsening or ongoing symptoms.

## 2020-07-26 NOTE — Patient Instructions (Signed)

## 2020-09-19 ENCOUNTER — Other Ambulatory Visit: Payer: Self-pay | Admitting: Emergency Medicine

## 2020-09-19 ENCOUNTER — Telehealth: Payer: No Typology Code available for payment source | Admitting: Emergency Medicine

## 2020-09-19 DIAGNOSIS — J069 Acute upper respiratory infection, unspecified: Secondary | ICD-10-CM | POA: Diagnosis not present

## 2020-09-19 MED ORDER — IPRATROPIUM BROMIDE 0.03 % NA SOLN: 2.0000 | Freq: Two times a day (BID) | NASAL | 0 refills | Status: DC

## 2020-09-19 MED ORDER — BENZONATATE 100 MG PO CAPS: 100.0000 mg | ORAL_CAPSULE | Freq: Two times a day (BID) | ORAL | 0 refills | Status: DC | PRN

## 2020-09-19 NOTE — Progress Notes (Signed)
We are sorry you are not feeling well.  Here is how we plan to help!  Based on what you have shared with me, it looks like you may have a viral upper respiratory infection.  Upper respiratory infections are caused by a large number of viruses; however, rhinovirus is the most common cause.   Symptoms vary from person to person, with common symptoms including sore throat, cough, fatigue or lack of energy and feeling of general discomfort.  A low-grade fever of up to 100.4 may present, but is often uncommon.  Symptoms vary however, and are closely related to a person's age or underlying illnesses.  The most common symptoms associated with an upper respiratory infection are nasal discharge or congestion, cough, sneezing, headache and pressure in the ears and face.  These symptoms usually persist for about 3 to 10 days, but can last up to 2 weeks.  It is important to know that upper respiratory infections do not cause serious illness or complications in most cases.    Upper respiratory infections can be transmitted from person to person, with the most common method of transmission being a person's hands.  The virus is able to live on the skin and can infect other persons for up to 2 hours after direct contact.  Also, these can be transmitted when someone coughs or sneezes; thus, it is important to cover the mouth to reduce this risk.  To keep the spread of the illness at bay, good hand hygiene is very important.  This is an infection that is most likely caused by a virus. There are no specific treatments other than to help you with the symptoms until the infection runs its course.  We are sorry you are not feeling well.  Here is how we plan to help!   For nasal congestion, you may use an oral decongestants such as Mucinex D or if you have glaucoma or high blood pressure use plain Mucinex.  Saline nasal spray or nasal drops can help and can safely be used as often as needed for congestion.  For your congestion,  I have prescribed Ipratropium Bromide nasal spray 0.03% two sprays in each nostril 2-3 times a day  If you do not have a history of heart disease, hypertension, diabetes or thyroid disease, prostate/bladder issues or glaucoma, you may also use Sudafed to treat nasal congestion.  It is highly recommended that you consult with a pharmacist or your primary care physician to ensure this medication is safe for you to take.     If you have a cough, you may use cough suppressants such as Delsym and Robitussin.  If you have glaucoma or high blood pressure, you can also use Coricidin HBP.   For cough I have prescribed for you A prescription cough medication called Tessalon Perles 100 mg. You may take 1-2 capsules every 8 hours as needed for cough  If you have a sore or scratchy throat, use a saltwater gargle-  to  teaspoon of salt dissolved in a 4-ounce to 8-ounce glass of warm water.  Gargle the solution for approximately 15-30 seconds and then spit.  It is important not to swallow the solution.  You can also use throat lozenges/cough drops and Chloraseptic spray to help with throat pain or discomfort.  Warm or cold liquids can also be helpful in relieving throat pain.  For headache, pain or general discomfort, you can use Ibuprofen or Tylenol as directed.   Some authorities believe that zinc sprays or   the use of Echinacea may shorten the course of your symptoms.   HOME CARE . Only take medications as instructed by your medical team. . Be sure to drink plenty of fluids. Water is fine as well as fruit juices, sodas and electrolyte beverages. You may want to stay away from caffeine or alcohol. If you are nauseated, try taking small sips of liquids. How do you know if you are getting enough fluid? Your urine should be a pale yellow or almost colorless. . Get rest. . Taking a steamy shower or using a humidifier may help nasal congestion and ease sore throat pain. You can place a towel over your head and  breathe in the steam from hot water coming from a faucet. . Using a saline nasal spray works much the same way. . Cough drops, hard candies and sore throat lozenges may ease your cough. . Avoid close contacts especially the very young and the elderly . Cover your mouth if you cough or sneeze . Always remember to wash your hands.   GET HELP RIGHT AWAY IF: . You develop worsening fever. . If your symptoms do not improve within 10 days . You develop yellow or green discharge from your nose over 3 days. . You have coughing fits . You develop a severe head ache or visual changes. . You develop shortness of breath, difficulty breathing or start having chest pain . Your symptoms persist after you have completed your treatment plan  MAKE SURE YOU   Understand these instructions.  Will watch your condition.  Will get help right away if you are not doing well or get worse.  Your e-visit answers were reviewed by a board certified advanced clinical practitioner to complete your personal care plan. Depending upon the condition, your plan could have included both over the counter or prescription medications. Please review your pharmacy choice. If there is a problem, you may call our nursing hot line at and have the prescription routed to another pharmacy. Your safety is important to [place]. If you have drug allergies check your prescription carefully.   You can use MyChart to ask questions about today's visit, request a non-urgent call back, or ask for a work or school excuse for 24 hours related to this e-Visit. If it has been greater than 24 hours you will need to follow up with your provider, or enter a new e-Visit to address those concerns. You will get an e-mail in the next two days asking about your experience.  I hope that your e-visit has been valuable and will speed your recovery. Thank you for using e-visits.     Approximately 5 minutes was used in reviewing the patient's chart,  questionnaire, prescribing medications, and documentation.

## 2020-09-24 ENCOUNTER — Other Ambulatory Visit: Payer: Self-pay | Admitting: Family Medicine

## 2020-09-24 DIAGNOSIS — G4701 Insomnia due to medical condition: Secondary | ICD-10-CM

## 2020-09-24 NOTE — Telephone Encounter (Signed)
Approved per protocol.  Requested Prescriptions  Pending Prescriptions Disp Refills  . traZODone (DESYREL) 100 MG tablet [Pharmacy Med Name: traZODone HCL 100 MG TABS 100 Tablet] 90 tablet 1    Sig: TAKE 1/2 TO 1 TABLET BY MOUTH AT BEDTIME AS NEEDED FOR SLEEP     Psychiatry: Antidepressants - Serotonin Modulator Passed - 09/24/2020  1:43 PM      Passed - Completed PHQ-2 or PHQ-9 in the last 360 days      Passed - Valid encounter within last 6 months    Recent Outpatient Visits          2 months ago Bronchospasm   [facility] [place], Corrie Dandy T, NP   6 months ago Urinary frequency   [facility] [place], [place], [place]   8 months ago Acute non-recurrent maxillary sinusitis   [facility] [place], Megan P, DO   8 months ago Pain in other joint   [facility] [place], Megan P, DO   1 year ago Vertigo of central origin   [facility] [place], [place], DO

## 2020-10-03 ENCOUNTER — Other Ambulatory Visit: Payer: Self-pay | Admitting: Nurse Practitioner

## 2020-10-03 ENCOUNTER — Telehealth: Payer: No Typology Code available for payment source | Admitting: Nurse Practitioner

## 2020-10-03 DIAGNOSIS — N3000 Acute cystitis without hematuria: Secondary | ICD-10-CM

## 2020-10-03 MED ORDER — CEPHALEXIN 500 MG PO CAPS: 500.0000 mg | ORAL_CAPSULE | Freq: Two times a day (BID) | ORAL | 0 refills | Status: DC

## 2020-10-03 NOTE — Progress Notes (Signed)
We are sorry that you are not feeling well.  Here is how we plan to help!  Based on what you shared with me it looks like you most likely have a simple urinary tract infection.  A UTI (Urinary Tract Infection) is a bacterial infection of the bladder.  Most cases of urinary tract infections are simple to treat but a key part of your care is to encourage you to drink plenty of fluids and watch your symptoms carefully.  I have prescribed Keflex 500 mg twice a day for 7 days.  Your symptoms should gradually improve. Call us if the burning in your urine worsens, you develop worsening fever, back pain or pelvic pain or if your symptoms do not resolve after completing the antibiotic.  If you are not improving by Friday morning you will need a face to face visit for urinalysis and urine culture.  Urinary tract infections can be prevented by drinking plenty of water to keep your body hydrated.  Also be sure when you wipe, wipe from front to back and don't hold it in!  If possible, empty your bladder every 4 hours.  Your e-visit answers were reviewed by a board certified advanced clinical practitioner to complete your personal care plan.  Depending on the condition, your plan could have included both over the counter or prescription medications.  If there is a problem please reply  once you have received a response from your provider.  Your safety is important to [place].  If you have drug allergies check your prescription carefully.    You can use MyChart to ask questions about today's visit, request a non-urgent call back, or ask for a work or school excuse for 24 hours related to this e-Visit. If it has been greater than 24 hours you will need to follow up with your provider, or enter a new e-Visit to address those concerns.   You will get an e-mail in the next two days asking about your experience.  I hope that your e-visit has been valuable and will speed your recovery. Thank you for using  e-visits.  5-10 minutes spent reviewing and documenting in chart.

## 2020-10-23 ENCOUNTER — Other Ambulatory Visit: Payer: Self-pay

## 2020-10-23 ENCOUNTER — Ambulatory Visit (INDEPENDENT_AMBULATORY_CARE_PROVIDER_SITE_OTHER): Payer: No Typology Code available for payment source | Admitting: Family Medicine

## 2020-10-23 ENCOUNTER — Other Ambulatory Visit: Payer: Self-pay | Admitting: Family Medicine

## 2020-10-23 ENCOUNTER — Encounter: Payer: Self-pay | Admitting: Family Medicine

## 2020-10-23 VITALS — BP 114/78 | HR 67 | Temp 98.5°F | Wt 213.6 lb

## 2020-10-23 DIAGNOSIS — Z1231 Encounter for screening mammogram for malignant neoplasm of breast: Secondary | ICD-10-CM

## 2020-10-23 DIAGNOSIS — M545 Low back pain, unspecified: Secondary | ICD-10-CM | POA: Diagnosis not present

## 2020-10-23 DIAGNOSIS — M5442 Lumbago with sciatica, left side: Secondary | ICD-10-CM | POA: Diagnosis not present

## 2020-10-23 DIAGNOSIS — M5441 Lumbago with sciatica, right side: Secondary | ICD-10-CM

## 2020-10-23 DIAGNOSIS — R21 Rash and other nonspecific skin eruption: Secondary | ICD-10-CM | POA: Insufficient documentation

## 2020-10-23 DIAGNOSIS — G8929 Other chronic pain: Secondary | ICD-10-CM

## 2020-10-23 MED ORDER — BACLOFEN 10 MG PO TABS: 10.0000 mg | ORAL_TABLET | Freq: Two times a day (BID) | ORAL | 0 refills | Status: DC | PRN

## 2020-10-23 MED ORDER — TRIAMCINOLONE ACETONIDE 0.1 % EX CREA: 1.0000 "application " | TOPICAL_CREAM | Freq: Two times a day (BID) | CUTANEOUS | 0 refills | Status: DC

## 2020-10-23 NOTE — Assessment & Plan Note (Signed)
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 Will refer to derm for definitive dx with biopsy. Prior autoimmune testing wnl. Will trial low dose topical steroid. F/u prn.

## 2020-10-23 NOTE — Patient Instructions (Signed)
.   Lie on your back. Bend your right knee so that your right foot is flat on the floor. Jed Limerick your left leg over your right so that your left ankle rests on your right knee. . Use your hands to grab hold of your left knee and pull it gently toward the opposite shoulder. You should feel the stretch in your buttocks and hips. . Hold for 15 to 30 seconds. . Relax, and then repeat with the other leg. . Repeat this cycle 2 to 4 times.   Lie on your back in a doorway, with one leg through the open door. Slide your leg up the wall to straighten your knee. You should feel a gentle stretch down the back of your leg. Hold the stretch for at least 1 minute. As the days go by, add a little more time until you can relax and let these muscles stretch for as much as 6 minutes for each leg. Do not arch your back. Do not bend either knee. Keep one heel touching the floor and the other heel touching the wall. Do not point your toes. Repeat with your other leg. Do 2 to 4 times for each leg. If you do not have a place to do this exercise in a doorway, there is another way to do it:  Lie on your back and bend the knee of the leg you want to stretch. Loop a towel under the ball and toes of that foot, and hold the ends of the towel in your hands. Straighten your knee and slowly pull back on the towel. You should feel a gentle stretch down the back of your leg. It is hard to hold this stretch with a towel for a long time, but hold the stretch for at least 15 to 30 seconds. One minute or more is even better. Repeat with your other leg. Do 2 to 4 times for each leg.   Child's Pose 1. Kneel on the floor with your toes together and your knees hip-width apart. Rest your palms on top of your thighs. 2. On an exhale, lower your torso between your knees. Extend your arms alongside your torso with your palms facing down. Relax your shoulders toward the ground. Rest in the pose for as long as needed.

## 2020-10-23 NOTE — Progress Notes (Signed)
SUBJECTIVE:   CHIEF COMPLAINT / HPI:   Patient Active Problem List   Diagnosis Date Noted  . Rash 10/23/2020  . Cough 07/26/2020  . Vertigo 06/16/2019  . MDD (major depressive disorder), recurrent episode, moderate (HCC) 06/07/2019  . GAD (generalized anxiety disorder) 05/26/2019  . Memory deficit 04/26/2019  . Sleeping difficulty 04/13/2019  . Chronic low back pain without sciatica 03/08/2019  . Hip pain, acute, left 03/08/2019  . Seizure-like activity (HCC) 03/08/2019  . Moderate persistent asthma 01/27/2019  . Left leg weakness 12/30/2018  . Cervical spinal stenosis 12/30/2018  . Benign essential tremor 12/30/2018  . Chronic venous insufficiency 03/10/2018  . Lymphedema 03/10/2018  . Anxiety 10/13/2017  . Familial hyperlipidemia 10/10/2017  . Chronic kidney disease, stage III (moderate) (HCC) 09/03/2017  . Vitamin D deficiency 09/03/2017  . Vitamin B12 deficiency 09/03/2017  . Insomnia 08/11/2017  . Menopausal symptoms 08/11/2017  . Migraine without aura and without status migrainosus, not intractable 02/21/2017  . Prediabetes 02/20/2017  . 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 VASOVAGAL SYNCOPE 11/27/2009  . Allergic rhinitis 01/18/2008   BACK PAIN - h/o spinal stenosis, chronic LBP with radiation to L leg. Prior fusion at L4-5. Lumbar XR 02/2019 without acute abnormality. CT thoracic spine 02/2019 without acute abnormality. - pain in mornings when first gets up - did fine after fusion surgery ~2003 with return of pain few months ago. Exacerbated after lifting boxes at work or lifting granddaughter. Otherwise no known injury.  Duration: few months Mechanism of injury: unknown Location: bilateral and low back Onset: sudden Quality: sharp Frequency: intermittent Radiation: down b/l legs Aggravating factors: lifting, laying and prolonged sitting Alleviating factors: nothing Status: worse Treatments attempted: rest, ice, heat  and ibuprofen  Relief with NSAIDs?: mild Nighttime pain:  yes Paresthesias / decreased sensation:  yes at night sometimes Bowel / bladder incontinence:  no Fevers:  no Dysuria / urinary frequency:  no   RASH - size of quarter, started out red with scale.  - on arms, legs, shoulders - never happened before - uncle with psorasis, mom with ?lupus.  Duration:  months  Location: arms and legs  Itching: yes Burning: sometimes Redness: yes Oozing: no Scaling: yes Blisters: no  Bleeding: no Painful: no Fevers: no Change in detergents/soaps/personal care products: no Recent illness: sinus infection recently. was on keflex for UTI History of same: no Context: stable Alleviating factors: nothing Treatments attempted:nothing Shortness of breath: no  Throat/tongue swelling: no Myalgias/arthralgias: yes    OBJECTIVE:   BP 114/78   Pulse 67   Temp 98.5 F (36.9 C) (Oral)   Wt 213 lb 9.6 oz (96.9 kg)   SpO2 96%   BMI 34.48 kg/m   Gen: well appearing, in NAD MSK: no midline spinal tenderness. TTP over lumbar paravertebral musculature without appreciated hypertonicity. No tenderness over b/l piriformis. Patellar and achilles reflexes symmetric and intact. 5/5 LE strength. Full AROM, some pain with extension.  Skin: scattered circular erythematous lesions with overlying scale, largest ~1cm in diameter. No bleeding.  ASSESSMENT/PLAN:   Chronic low back pain without sciatica With recent flare in few months, likely MSK etiology given exacerbated by certain movements. Will obtain lumbar XR to check hardware. Handout on stretches provided. Continue to use NSAIDs, heat. Will also trial short course of muscle relaxer. Referral placed for PT. F/u prn.  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 Will refer to derm for definitive dx with biopsy. Prior autoimmune testing  wnl. Will trial low dose topical steroid. F/u prn.    Caro Laroche, DO

## 2020-10-23 NOTE — Assessment & Plan Note (Addendum)
With recent flare in few months, likely MSK etiology given exacerbated by certain movements. Will obtain lumbar XR to check hardware. Handout on stretches provided. Continue to use NSAIDs, heat. Will also trial short course of muscle relaxer. Referral placed for PT. F/u prn.

## 2020-10-25 ENCOUNTER — Other Ambulatory Visit: Payer: Self-pay

## 2020-10-25 ENCOUNTER — Encounter: Payer: Self-pay | Admitting: Dermatology

## 2020-10-25 ENCOUNTER — Telehealth: Payer: Self-pay

## 2020-10-25 ENCOUNTER — Other Ambulatory Visit: Payer: Self-pay | Admitting: Dermatology

## 2020-10-25 ENCOUNTER — Ambulatory Visit: Payer: No Typology Code available for payment source | Admitting: Dermatology

## 2020-10-25 DIAGNOSIS — L409 Psoriasis, unspecified: Secondary | ICD-10-CM

## 2020-10-25 MED ORDER — HYDROCORTISONE 2.5 % EX CREA: TOPICAL_CREAM | Freq: Two times a day (BID) | CUTANEOUS | 2 refills | Status: DC | PRN

## 2020-10-25 MED ORDER — BETAMETHASONE DIPROPIONATE 0.05 % EX OINT: TOPICAL_OINTMENT | Freq: Two times a day (BID) | CUTANEOUS | 2 refills | Status: DC | PRN

## 2020-10-25 NOTE — Patient Instructions (Addendum)
Psoriasis is a chronic non-curable, but treatable genetic/hereditary disease that may have other systemic features affecting other organ systems such as joints (Psoriatic Arthritis). It is associated with an increased risk of inflammatory bowel disease, heart disease, non-alcoholic fatty liver disease, and depression.    Start augmented betamethasone ointment to AA body twice daily up to 3 weeks.  Avoid applying to face, groin, and axilla. Use as directed. Risk of skin atrophy with long-term use reviewed.   Start hydrocortisone 2.5% cream twice daily to AA face, groin, underarms for up to 3 weeks.   Topical steroids (such as triamcinolone, fluocinolone, fluocinonide, mometasone, clobetasol, halobetasol, betamethasone, hydrocortisone) can cause thinning and lightening of the skin if they are used for too long in the same area. Your physician has selected the right strength medicine for your problem and area affected on the body. Please use your medication only as directed by your physician to prevent side effects.

## 2020-10-25 NOTE — Progress Notes (Signed)
   New Patient Visit  Subjective  Emma Rogers is a 55 y.o. female who presents for the following: Rash (Patient here today for rash at arms, legs, face. First noticed about 6 months ago. Patient saw PCP 2 days ago and they suggested she be seen by dermatology to be evaluated for psoriasis. Patient's uncle has psoriatic arthritis and her mother has psoriasis. ).  She is not treating with anything right now. She does have a lot of pain in the mornings in fingers, toes, knees, back. Advises most days once she is up and moving around it subsides but not always.  No recent sore throat. She has noticed mucus in her stool during the last 2 months but does have irritable bowel. She also reports abdominal pain.   The following portions of the chart were reviewed this encounter and updated as appropriate:   Tobacco  Allergies  Meds  Problems  Med Hx  Surg Hx  Fam Hx      Review of Systems:  No other skin or systemic complaints except as noted in HPI or Assessment and Plan.  Objective  Well appearing patient in no apparent distress; mood and affect are within normal limits.  A focused examination was performed including face, neck, chest and back and arms, legs. Relevant physical exam findings are noted in the Assessment and Plan.  Objective  arms, legs: Round scaly red plaques scattered at arms and legs   Assessment & Plan  Psoriasis arms, legs  Chronic condition with expected duration over one year. Condition is bothersome to patient. Currently flared.  Start augmented betamethasone ointment to AA body twice daily up to 3 weeks.  Avoid applying to face, groin, and axilla. Use as directed. Risk of skin atrophy with long-term use reviewed.   Will order ASO titer.  Start hydrocortisone 2.5% cream twice daily to AA face, groin, underarms for up to 3 weeks.   May send in calcipotriene if not cleared in 3 weeks.   Will refer to rheumatology and gastroenterology.   Psoriasis is a  chronic non-curable, but treatable genetic/hereditary disease that may have other systemic features affecting other organ systems such as joints (Psoriatic Arthritis). It is associated with an increased risk of inflammatory bowel disease, heart disease, non-alcoholic fatty liver disease, and depression.   Topical steroids (such as triamcinolone, fluocinolone, fluocinonide, mometasone, clobetasol, halobetasol, betamethasone, hydrocortisone) can cause thinning and lightening of the skin if they are used for too long in the same area. Your physician has selected the right strength medicine for your problem and area affected on the body. Please use your medication only as directed by your physician to prevent side effects.      Ordered Medications: hydrocortisone 2.5 % cream betamethasone dipropionate (DIPROLENE) 0.05 % ointment  Return in about 6 weeks (around 12/06/2020) for Psoriasis.  Anise Salvo, RMA, am acting as scribe for Darden Dates, MD .   Documentation: I have reviewed the above documentation for accuracy and completeness, and I agree with the above.  Darden Dates, MD

## 2020-10-25 NOTE — Telephone Encounter (Signed)
Left message for patient to call concerning order for lab, JS

## 2020-11-09 ENCOUNTER — Other Ambulatory Visit: Payer: Self-pay | Admitting: Family Medicine

## 2020-11-09 DIAGNOSIS — F331 Major depressive disorder, recurrent, moderate: Secondary | ICD-10-CM

## 2020-11-19 ENCOUNTER — Ambulatory Visit: Payer: No Typology Code available for payment source | Attending: Family Medicine | Admitting: Physical Therapy

## 2020-11-19 ENCOUNTER — Telehealth: Payer: Self-pay | Admitting: Physical Therapy

## 2020-11-19 NOTE — Telephone Encounter (Signed)
Called patient when she did not show up for her 5:30pm appointment. No answer. Left VM asking patient to return call.   Emma Rogers. Ilsa Iha, PT, DPT 11/19/20, 5:49 PM

## 2020-11-21 ENCOUNTER — Ambulatory Visit: Payer: No Typology Code available for payment source | Admitting: Physical Therapy

## 2020-11-26 ENCOUNTER — Encounter: Payer: No Typology Code available for payment source | Admitting: Physical Therapy

## 2020-11-27 DIAGNOSIS — L4050 Arthropathic psoriasis, unspecified: Secondary | ICD-10-CM | POA: Insufficient documentation

## 2020-11-27 DIAGNOSIS — M47819 Spondylosis without myelopathy or radiculopathy, site unspecified: Secondary | ICD-10-CM | POA: Insufficient documentation

## 2020-11-27 DIAGNOSIS — Z8669 Personal history of other diseases of the nervous system and sense organs: Secondary | ICD-10-CM

## 2020-11-27 HISTORY — DX: Personal history of other diseases of the nervous system and sense organs: Z86.69

## 2020-11-28 ENCOUNTER — Encounter: Payer: No Typology Code available for payment source | Admitting: Physical Therapy

## 2020-11-29 ENCOUNTER — Ambulatory Visit: Payer: No Typology Code available for payment source | Admitting: Gastroenterology

## 2020-12-03 ENCOUNTER — Encounter: Payer: No Typology Code available for payment source | Admitting: Physical Therapy

## 2020-12-05 ENCOUNTER — Encounter: Payer: No Typology Code available for payment source | Admitting: Physical Therapy

## 2020-12-06 ENCOUNTER — Ambulatory Visit: Payer: No Typology Code available for payment source | Admitting: Dermatology

## 2020-12-09 ENCOUNTER — Telehealth: Payer: No Typology Code available for payment source | Admitting: Physician Assistant

## 2020-12-09 DIAGNOSIS — R21 Rash and other nonspecific skin eruption: Secondary | ICD-10-CM | POA: Diagnosis not present

## 2020-12-09 MED ORDER — CIPROFLOXACIN-DEXAMETHASONE 0.3-0.1 % OT SUSP: OTIC | 0 refills | Status: DC

## 2020-12-09 NOTE — Progress Notes (Signed)
Hi Rainey,  E Visit for Rash  I am sorry you aren't feeling well.  Here is how we plan to help!   I have prescribed: Ciprofloxin 0.2% and hydrocortisone 1% otic suspension 3 drops in affected ears twice daily for 7 days  Be sure to follow-up with your PCP or rheumatologist as soon as you can schedule an appointment.     HOME CARE:   Take cool showers and avoid direct sunlight.  Apply cool compress or wet dressings.  Take an antihistamine like Benadryl for widespread rashes that itch.  The adult dose of Benadryl is 25-50 mg by mouth 4 times daily.  Caution:  This type of medication may cause sleepiness.  Do not drink alcohol, drive, or operate dangerous machinery while taking antihistamines.  Do not take these medications if you have prostate enlargement.  Read package instructions thoroughly on all medications that you take.  GET HELP RIGHT AWAY IF:   Symptoms don't go away after treatment.  Severe itching that persists.  If you rash spreads or swells.  If you rash begins to smell.  If it blisters and opens or develops a yellow-brown crust.  You develop a fever.  You have a sore throat.  You become short of breath.  MAKE SURE YOU:  Understand these instructions. Will watch your condition. Will get help right away if you are not doing well or get worse.  Thank you for choosing an e-visit. Your e-visit answers were reviewed by a board certified advanced clinical practitioner to complete your personal care plan. Depending upon the condition, your plan could have included both over the counter or prescription medications. Please review your pharmacy choice. Be sure that the pharmacy you have chosen is open so that you can pick up your prescription now.  If there is a problem you may message your provider in MyChart to have the prescription routed to another pharmacy. Your safety is important to [place]. If you have drug allergies check your prescription carefully.  For the  next 24 hours, you can use MyChart to ask questions about today's visit, request a non-urgent call back, or ask for a work or school excuse from your e-visit provider. You will get an email in the next two days asking about your experience. I hope that your e-visit has been valuable and will speed your recovery.     Greater than 5 minutes, yet less than 10 minutes of time have been spent researching, coordinating and implementing care for this patient today.

## 2020-12-10 ENCOUNTER — Encounter: Payer: No Typology Code available for payment source | Admitting: Physical Therapy

## 2020-12-12 ENCOUNTER — Encounter: Payer: No Typology Code available for payment source | Admitting: Physical Therapy

## 2020-12-17 ENCOUNTER — Encounter: Payer: No Typology Code available for payment source | Admitting: Physical Therapy

## 2020-12-19 ENCOUNTER — Ambulatory Visit: Payer: No Typology Code available for payment source | Attending: Family Medicine | Admitting: Pharmacist

## 2020-12-19 ENCOUNTER — Other Ambulatory Visit: Payer: Self-pay

## 2020-12-19 ENCOUNTER — Encounter: Payer: No Typology Code available for payment source | Admitting: Physical Therapy

## 2020-12-19 ENCOUNTER — Other Ambulatory Visit: Payer: Self-pay | Admitting: Internal Medicine

## 2020-12-19 DIAGNOSIS — Z79899 Other long term (current) drug therapy: Secondary | ICD-10-CM

## 2020-12-19 MED ORDER — HUMIRA PEN 40 MG/0.8ML ~~LOC~~ PNKT: PEN_INJECTOR | SUBCUTANEOUS | 12 refills | Status: DC

## 2020-12-19 NOTE — Progress Notes (Signed)
  S: Patient presents for review of their specialty medication therapy.  Patient is about to start Humira for psoriatic arthritis. Patient is managed by Dr. Allena Katz for this.   Adherence: has not yet started   Efficacy: has not yet started  Dosing: Psoriatic arthritis: SubQ: 40 mg every other week (may continue methotrexate, other nonbiologic DMARDS, corticosteroids, NSAIDs and/or analgesics)  Dose adjustments: Renal: no dose adjustments (has not been studied) Hepatic: no dose adjustments (has not been studied)  Drug-drug interactions: none identified   Screening: TB test: completed - negative (11/30/2020) Hepatitis: completed - negative (11/30/2020)  Monitoring: S/sx of infection: none CBC: WNL (11/27/2020) S/sx of hypersensitivity: has not started; counseling provided S/sx of malignancy: has not started; counseling provided S/sx of heart failure: has not started; counseling provided  Other side effects: has not started; counseling provided  O:     Lab Results  Component Value Date   WBC 9.4 01/12/2020   HGB 14.1 01/12/2020   HCT 40.2 01/12/2020   MCV 94 01/12/2020   PLT 285 01/12/2020      Chemistry      Component Value Date/Time   NA 138 01/12/2020 1607   NA 135 (L) 06/15/2014 2324   K 4.2 01/12/2020 1607   K 3.3 (L) 06/15/2014 2324   CL 100 01/12/2020 1607   CL 98 06/15/2014 2324   CO2 25 01/12/2020 1607   CO2 31 06/15/2014 2324   BUN 18 01/12/2020 1607   BUN 15 06/15/2014 2324   CREATININE 0.99 01/12/2020 1607   CREATININE 1.14 (H) 03/01/2019 1452      Component Value Date/Time   CALCIUM 9.3 01/12/2020 1607   CALCIUM 9.2 06/15/2014 2324   ALKPHOS 59 01/12/2020 1607   AST 16 01/12/2020 1607   ALT 13 01/12/2020 1607   BILITOT 0.4 01/12/2020 1607       A/P: 1. Medication review: Patient is to begin Humira for psoriatic arthritis. Reviewed the medication with the patient, including the following: Humira is a TNF blocking agent indicated for ankylosing  spondylitis, Crohn's disease, Hidradenitis suppurativa, psoriatic arthritis, plaque psoriasis, ulcerative colitis, and uveitis. Patient educated on purpose, proper use and potential adverse effects of Humira. Possible adverse effects are increased risk of infections, headache, and injection site reactions. There is the possibility of an increased risk of malignancy but it is not well understood if this increased risk is due to there medication or the disease state. There are rare cases of pancytopenia and aplastic anemia. For SubQ injection at separate sites in the thigh or lower abdomen (avoiding areas within 2 inches of navel); rotate injection sites. May leave at room temperature for ~15 to 30 minutes prior to use; do not remove cap or cover while allowing product to reach room temperature. Do not use if solution is discolored or contains particulate matter. Do not administer to skin which is red, tender, bruised, hard, or that has scars, stretch marks, or psoriasis plaques. Needle cap of the prefilled syringe or needle cover for the adalimumab pen may contain latex. Prefilled pens and syringes are available for use by patients and the full amount of the syringe should be injected (self-administration); the vial is intended for institutional use only. Vials do not contain a preservative; discard unused portion. No recommendations for any changes at this time.  Butch Penny, PharmD, P[place]y [place], CPP Clinical Pharmacist [facility] [phone]

## 2020-12-21 ENCOUNTER — Other Ambulatory Visit: Payer: Self-pay | Admitting: Family Medicine

## 2020-12-21 DIAGNOSIS — G4701 Insomnia due to medical condition: Secondary | ICD-10-CM

## 2020-12-31 ENCOUNTER — Ambulatory Visit: Payer: No Typology Code available for payment source | Admitting: Gastroenterology

## 2020-12-31 ENCOUNTER — Other Ambulatory Visit: Payer: Self-pay

## 2020-12-31 NOTE — Progress Notes (Deleted)
Gastroenterology Consultation  Referring Provider:     Dorcas Carrow, DO Primary Care Physician:  Dorcas Carrow, DO Primary Gastroenterologist:  Dr. Servando Snare     Reason for Consultation:     Psoriasis        HPI:   Emma Rogers is a 55 y.o. y/o female referred for consultation & management of psoriasis by Dr. Laural Benes, Megan P, DO.  This patient was seen by dermatology for a rash and had been told that the rash was consistent with psoriasis.  Due to the association with inflammatory bowel disease and nonalcoholic fatty liver disease and including other nondermatological organ involvement such as arthritis the patient was referred to both rheumatology and gastroenterology.  Past Medical History:  Diagnosis Date  . Abnormal MRI 04/03/2019  . Allergic rhinitis   . Anxiety   . Asthma   . Depression   . Headache(784.0)   . Renal insufficiency    Stage 3 kidney disease.   . Shingles     Past Surgical History:  Procedure Laterality Date  . ABDOMINAL HYSTERECTOMY  2009   still has 1 ovary  . BREAST BIOPSY Left 09/30/2002   Ductogram-neg  . INSERT / REPLACE / REMOVE PACEMAKER     Permanent pacemaker, Dr. [name]  . Lobe hemithroidectomy     Right    Prior to Admission medications   Medication Sig Start Date End Date Taking? Authorizing Provider  Adalimumab 40 MG/0.8ML PNKT INJECT 40MG  UNDER THE SKIN EVERY 2 WEEKS AS DIRECTED. 12/19/20 12/19/21  12/21/21, MD  albuterol (VENTOLIN HFA) 108 (90 Base) MCG/ACT inhaler Inhale 2 puffs into the lungs every 6 (six) hours as needed for wheezing or shortness of breath. 07/26/20   Cannady, 13/4/21 T, NP  atorvastatin (LIPITOR) 40 MG tablet Take 1 tablet (40 mg total) by mouth at bedtime. 01/12/20   Johnson, Megan P, DO  baclofen (LIORESAL) 10 MG tablet Take 1 tablet (10 mg total) by mouth 2 (two) times daily as needed for muscle spasms. 10/23/20   12/21/20, DO  betamethasone dipropionate (DIPROLENE) 0.05 % ointment Apply  topically 2 (two) times daily as needed (Rash). To affected areas body for up to 3 weeks. Avoid applying to face, groin, and axilla. Use as directed. Risk of skin atrophy with long-term use reviewed. 10/25/20   Moye, 12/23/20, MD  budesonide-formoterol (SYMBICORT) 80-4.5 MCG/ACT inhaler Inhale 2 puffs into the lungs 2 (two) times daily. 12/10/18   12/12/18, MD  buPROPion (WELLBUTRIN XL) 300 MG 24 hr tablet TAKE 1 TABLET (300 MG TOTAL) BY MOUTH DAILY. 11/09/20   11/11/20, DO  ciprofloxacin-dexamethasone (CIPRODEX) OTIC suspension Apply 3 drops in affected ears twice daily for 7 days 12/09/20   McVey, 12/11/20, PA-C  Cyanocobalamin 1000 MCG CAPS Take 1 capsule by mouth daily. 04/26/19   Johnson, Megan P, DO  furosemide (LASIX) 20 MG tablet Take 1 tablet (20 mg total) by mouth daily. 06/21/20   Johnson, Megan P, DO  hydrocortisone 2.5 % cream Apply topically 2 (two) times daily as needed (Rash). Affected areas face, groin, underarms for up to 3 weeks 10/25/20   [facility], [facility], MD  ipratropium (ATROVENT) 0.03 % nasal spray Place 2 sprays into both nostrils every 12 (twelve) hours. 09/19/20   09/21/20, PA-C  rizatriptan (MAXALT) 10 MG tablet Take 1 tablet (10 mg total) by mouth as needed for migraine. May repeat in 2 hours if needed 01/12/20   01/14/20  P, DO  traZODone (DESYREL) 100 MG tablet TAKE 1/2 TO 1 TABLET BY MOUTH AT BEDTIME AS NEEDED FOR SLEEP 12/21/20   Johnson, Megan P, DO  triamcinolone (KENALOG) 0.1 % Apply 1 application topically 2 (two) times daily. 10/23/20   Caro Laroche, DO  VITAMIN D PO Take 1 tablet by mouth daily.    [provider]  sertraline (ZOLOFT) 100 MG tablet Take 1 tablet (100 mg total) by mouth daily. Patient not taking: Reported on 10/12/2019 05/26/19 12/30/19  [name], MD    Family History  Problem Relation Age of Onset  . Heart disease Father        pacemaker,arrythmias  . Lung disease Father   . Asthma Other   . Coronary artery  disease Other        Female 1st degree relative <50  . Lung cancer Other   . Prostate cancer Other        1st degree relative <50  . COPD Maternal Grandmother   . Breast cancer Maternal Grandmother   . Heart disease Maternal Grandfather   . Liver disease Paternal Grandmother   . Heart disease Paternal Grandfather      Social History   Tobacco Use  . Smoking status: Never Smoker  . Smokeless tobacco: Never Used  Vaping Use  . Vaping Use: Never used  Substance Use Topics  . Alcohol use: No    Alcohol/week: 0.0 standard drinks  . Drug use: No    Allergies as of 12/31/2020 - Review Complete 10/25/2020  Allergen Reaction Noted  . Amoxicillin Other (See Comments) 07/23/2015  . Clavulanic acid Other (See Comments) 07/23/2015  . Morphine and related  02/13/2012    Review of Systems:    All systems reviewed and negative except where noted in HPI.   Physical Exam:  There were no vitals taken for this visit. No LMP recorded. Patient has had a hysterectomy. General:   Alert,  Well-developed, well-nourished, pleasant and cooperative in NAD Head:  Normocephalic and atraumatic. Eyes:  Sclera clear, no icterus.   Conjunctiva pink. Ears:  Normal auditory acuity. Neck:  Supple; no masses or thyromegaly. Lungs:  Respirations even and unlabored.  Clear throughout to auscultation.   No wheezes, crackles, or rhonchi. No acute distress. Heart:  Regular rate and rhythm; no murmurs, clicks, rubs, or gallops. Abdomen:  Normal bowel sounds.  No bruits.  Soft, non-tender and non-distended without masses, hepatosplenomegaly or hernias noted.  No guarding or rebound tenderness.  Negative Carnett sign.   Rectal:  Deferred.  Pulses:  Normal pulses noted. Extremities:  No clubbing or edema.  No cyanosis. Neurologic:  Alert and oriented x3;  grossly normal neurologically. Skin:  Intact without significant lesions or rashes.  No jaundice. Lymph Nodes:  No significant cervical adenopathy. Psych:  Alert  and cooperative. Normal mood and affect.  Imaging Studies: No results found.  Assessment and Plan:   Emma Rogers is a 55 y.o. y/o female ***    Midge Minium, MD. Clementeen Graham    Note: This dictation was prepared with Dragon dictation along with smaller phrase technology. Any transcriptional errors that result from this process are unintentional.

## 2021-01-08 ENCOUNTER — Emergency Department
Admission: EM | Admit: 2021-01-08 | Discharge: 2021-01-08 | Disposition: A | Discharge status: Home / Self Care | Payer: No Typology Code available for payment source | Attending: Emergency Medicine | Admitting: Emergency Medicine

## 2021-01-08 ENCOUNTER — Other Ambulatory Visit: Payer: Self-pay

## 2021-01-08 ENCOUNTER — Emergency Department: Payer: No Typology Code available for payment source

## 2021-01-08 ENCOUNTER — Encounter: Payer: Self-pay | Admitting: Emergency Medicine

## 2021-01-08 DIAGNOSIS — J45909 Unspecified asthma, uncomplicated: Secondary | ICD-10-CM | POA: Insufficient documentation

## 2021-01-08 DIAGNOSIS — Z7951 Long term (current) use of inhaled steroids: Secondary | ICD-10-CM | POA: Insufficient documentation

## 2021-01-08 DIAGNOSIS — R0789 Other chest pain: Secondary | ICD-10-CM | POA: Insufficient documentation

## 2021-01-08 DIAGNOSIS — N183 Chronic kidney disease, stage 3 unspecified: Secondary | ICD-10-CM | POA: Insufficient documentation

## 2021-01-08 HISTORY — DX: Arthropathic psoriasis, unspecified: L40.50

## 2021-01-08 LAB — CBC
HCT: 41.9 % (ref 36.0–46.0)
Hemoglobin: 14.4 g/dL (ref 12.0–15.0)
MCH: 31.5 pg (ref 26.0–34.0)
MCHC: 34.4 g/dL (ref 30.0–36.0)
MCV: 91.7 fL (ref 80.0–100.0)
Platelets: 262 10*3/uL (ref 150–400)
RBC: 4.57 MIL/uL (ref 3.87–5.11)
RDW: 12.4 % (ref 11.5–15.5)
WBC: 5.8 10*3/uL (ref 4.0–10.5)
nRBC: 0 % (ref 0.0–0.2)

## 2021-01-08 LAB — BASIC METABOLIC PANEL
Anion gap: 8 (ref 5–15)
BUN: 20 mg/dL (ref 6–20)
CO2: 28 mmol/L (ref 22–32)
Calcium: 9.4 mg/dL (ref 8.9–10.3)
Chloride: 101 mmol/L (ref 98–111)
Creatinine, Ser: 1.38 mg/dL — ABNORMAL HIGH (ref 0.44–1.00)
GFR, Estimated: 46 mL/min — ABNORMAL LOW (ref >60)
Glucose, Bld: 142 mg/dL — ABNORMAL HIGH (ref 70–99)
Potassium: 3.9 mmol/L (ref 3.5–5.1)
Sodium: 137 mmol/L (ref 135–145)

## 2021-01-08 LAB — TROPONIN I (HIGH SENSITIVITY): Troponin I (High Sensitivity): <2 ng/L (ref <18)

## 2021-01-08 NOTE — ED Provider Notes (Signed)
[facility] Emergency Department Provider Note   ____________________________________________    I have reviewed the triage vital signs and the nursing notes.   HISTORY  Chief Complaint Chest Pain     HPI Emma Rogers is a 55 y.o. female with a history of psoriatic arthritis for which she recently started Humira approximately 2 weeks ago, she had her second shot on Saturday.  The following day she felt achy which she reports is typical.  She also developed some mild chest discomfort which has persisted until today.  Denies fevers chills or cough.  No shortness of breath.  No calf pain or swelling.  No history of heart disease.  Does not smoke.  Has not take anything for this.  Past Medical History:  Diagnosis Date  . Abnormal MRI 04/03/2019  . Allergic rhinitis   . Anxiety   . Asthma   . Depression   . Headache(784.0)   . Psoriatic arthritis (HCC)   . Renal insufficiency    Stage 3 kidney disease.   . Shingles     Patient Active Problem List   Diagnosis Date Noted  . History of iritis 11/27/2020  . Psoriatic arthritis (HCC) 11/27/2020  . Seronegative spondyloarthropathy 11/27/2020  . Rash 10/23/2020  . Cough 07/26/2020  . Vertigo 06/16/2019  . MDD (major depressive disorder), recurrent episode, moderate (HCC) 06/07/2019  . GAD (generalized anxiety disorder) 05/26/2019  . Memory deficit 04/26/2019  . Sleeping difficulty 04/13/2019  . Chronic low back pain without sciatica 03/08/2019  . Hip pain, acute, left 03/08/2019  . Seizure-like activity (HCC) 03/08/2019  . Moderate persistent asthma 01/27/2019  . Left leg weakness 12/30/2018  . Cervical spinal stenosis 12/30/2018  . Benign essential tremor 12/30/2018  . Chronic venous insufficiency 03/10/2018  . Lymphedema 03/10/2018  . Anxiety 10/13/2017  . Familial hyperlipidemia 10/10/2017  . Chronic kidney disease, stage III (moderate) (HCC) 09/03/2017  . Vitamin D deficiency 09/03/2017   . Vitamin B12 deficiency 09/03/2017  . Insomnia 08/11/2017  . Menopausal symptoms 08/11/2017  . Migraine without aura and without status migrainosus, not intractable 02/21/2017  . Prediabetes 02/20/2017  . 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 VASOVAGAL SYNCOPE 11/27/2009  . Allergic rhinitis 01/18/2008    Past Surgical History:  Procedure Laterality Date  . ABDOMINAL HYSTERECTOMY  2009   still has 1 ovary  . BREAST BIOPSY Left 09/30/2002   Ductogram-neg  . INSERT / REPLACE / REMOVE PACEMAKER     Permanent pacemaker, Dr. Graciela HusbandsKlein  . Lobe hemithroidectomy     Right    Prior to Admission medications   Medication Sig Start Date End Date Taking? Authorizing Provider  Adalimumab 40 MG/0.8ML PNKT INJECT 40MG  UNDER THE SKIN EVERY 2 WEEKS AS DIRECTED. 12/19/20 12/19/21  Quentin AngstJegede, Olugbemiga E, MD  albuterol (VENTOLIN HFA) 108 (90 Base) MCG/ACT inhaler Inhale 2 puffs into the lungs every 6 (six) hours as needed for wheezing or shortness of breath. 07/26/20   Cannady, Corrie DandyJolene T, NP  albuterol (VENTOLIN HFA) 108 (90 Base) MCG/ACT inhaler INHALE 2 PUFFS INTO THE LUNGS EVERY 6 (SIX) HOURS AS NEEDED FOR WHEEZING OR SHORTNESS OF BREATH. 07/26/20 07/26/21  Cannady, Corrie DandyJolene T, NP  atorvastatin (LIPITOR) 40 MG tablet Take 1 tablet (40 mg total) by mouth at bedtime. 01/12/20   Johnson, Megan P, DO  azithromycin (ZITHROMAX) 250 MG tablet TAKE 2 TABLETS BY MOUTH ON DAY 1 THEN TAKE 1 TABLET DAILY FOR THE NEXT 4 DAYS 07/26/20 07/26/21  Cannady, Jolene T, NP  baclofen (LIORESAL) 10 MG tablet Take 1 tablet (10 mg total) by mouth 2 (two) times daily as needed for muscle spasms. 10/23/20   Caro Laroche, DO  baclofen (LIORESAL) 10 MG tablet TAKE 1 TABLET BY MOUTH TWO TIMES DAILY AS NEEDED FOR MUSCLE SPASMS 10/23/20 10/23/21  Caro Laroche, DO  benzonatate (TESSALON) 100 MG capsule TAKE 1 CAPSULE BY MOUTH 2 TIMES DAILY AS NEEDED FOR COUGH. 09/19/20 09/19/21  Roxy Horseman, PA-C   betamethasone dipropionate (DIPROLENE) 0.05 % ointment Apply topically 2 (two) times daily as needed (Rash). To affected areas body for up to 3 weeks. Avoid applying to face, groin, and axilla. Use as directed. Risk of skin atrophy with long-term use reviewed. 10/25/20   Moye, [place], MD  betamethasone dipropionate (DIPROLENE) 0.05 % ointment APPLY TOPICALLY 2 TIMES DAILY AS NEEDED (RASH) TO AFFECTED AREAS BODY FOR UP TO 3 WEEKS. AVOID APPLYING TO FACE, GROIN, AND AXILLA. 10/25/20 10/25/21  Moye, [place], MD  budesonide-formoterol (SYMBICORT) 80-4.5 MCG/ACT inhaler Inhale 2 puffs into the lungs 2 (two) times daily. 12/10/18   Erin Fulling, MD  buPROPion (WELLBUTRIN XL) 300 MG 24 hr tablet TAKE 1 TABLET (300 MG TOTAL) BY MOUTH DAILY. 11/09/20   Johnson, Megan P, DO  buPROPion (WELLBUTRIN XL) 300 MG 24 hr tablet TAKE 1 TABLET BY MOUTH DAILY 11/09/20 11/09/21  Johnson, Megan P, DO  buPROPion (WELLBUTRIN XL) 300 MG 24 hr tablet TAKE 1 TABLET (300 MG TOTAL) BY MOUTH DAILY. 01/12/20 01/11/21  Olevia Perches P, DO  cephALEXin (KEFLEX) 500 MG capsule TAKE 1 CAPSULE BY MOUTH 2 (TWO) TIMES DAILY 10/03/20 10/03/21  Bennie Pierini, FNP  ciprofloxacin-dexamethasone [facility]) OTIC suspension Apply 3 drops in affected ears twice daily for 7 days 12/09/20   McVey, Madelaine Bhat, PA-C  Cyanocobalamin 1000 MCG CAPS Take 1 capsule by mouth daily. 04/26/19   Johnson, Megan P, DO  furosemide (LASIX) 20 MG tablet Take 1 tablet (20 mg total) by mouth daily. 06/21/20   Johnson, Megan P, DO  furosemide (LASIX) 20 MG tablet TAKE 1 TABLET BY MOUTH DAILY. 06/21/20 06/21/21  Johnson, Megan P, DO  guaiFENesin-codeine 100-10 MG/5ML syrup TAKE 5 ML BY MOUTH 2 TIMES DAILY AS NEEDED FOR COUGH. 07/26/20 01/22/21  Cannady, Corrie Dandy T, NP  hydrocortisone 2.5 % cream Apply topically 2 (two) times daily as needed (Rash). Affected areas face, groin, underarms for up to 3 weeks 10/25/20   Moye, [place], MD  hydrocortisone 2.5 % cream APPLY TOPICALLY 2  (TWO) TIMES DAILY AS NEEDED (RASH). AFFECTED AREAS FACE, GROIN, UNDERARMS FOR UP TO 3 WEEKS 10/25/20 10/25/21  Moye, [place], MD  ipratropium (ATROVENT) 0.03 % nasal spray Place 2 sprays into both nostrils every 12 (twelve) hours. 09/19/20   Roxy Horseman, PA-C  ipratropium (ATROVENT) 0.03 % nasal spray PLACE 2 SPRAYS INTO BOTH NOSTRILS EVERY 12 HOURS. 09/19/20 09/19/21  Roxy Horseman, PA-C  predniSONE (DELTASONE) 20 MG tablet TAKE 2 TABLETS (40 MG) BY MOUTH DAILY WITH BREAKFAST FOR 5 DAYS. 07/26/20 07/26/21  Cannady, Corrie Dandy T, NP  rizatriptan (MAXALT) 10 MG tablet Take 1 tablet (10 mg total) by mouth as needed for migraine. May repeat in 2 hours if needed 01/12/20   Olevia Perches P, DO  rizatriptan (MAXALT) 10 MG tablet TAKE 1 TABLET BY MOUTH AS NEEDED FOR MIGRAINE. MAY REPEAT IN 2 HOURS IF NEEDED 01/12/20 01/11/21  Johnson, Megan P, DO  traZODone (DESYREL) 100 MG tablet TAKE 1/2 TO 1 TABLET BY MOUTH AT BEDTIME  AS NEEDED FOR SLEEP 12/21/20   Johnson, Megan P, DO  traZODone (DESYREL) 100 MG tablet TAKE 1/2 TO 1 TABLET BY MOUTH AT BEDTIME AS NEEDED FOR SLEEP 12/21/20 12/21/21  Johnson, Megan P, DO  traZODone (DESYREL) 100 MG tablet TAKE 1/2 TO 1 TABLET BY MOUTH AT BEDTIME AS NEEDED FOR SLEEP 09/24/20 09/24/21  Johnson, Megan P, DO  triamcinolone (KENALOG) 0.1 % Apply 1 application topically 2 (two) times daily. 10/23/20   Caro Laroche, DO  triamcinolone (KENALOG) 0.1 % APPLY TO THE AFFECTED AREA(S) TWO TIMES DAILY 10/23/20 10/23/21  Caro Laroche, DO  VITAMIN D PO Take 1 tablet by mouth daily.    [provider]  sertraline (ZOLOFT) 100 MG tablet Take 1 tablet (100 mg total) by mouth daily. Patient not taking: Reported on 10/12/2019 05/26/19 12/30/19  [name], MD     Allergies Amoxicillin, Clavulanic acid, and Morphine and related  Family History  Problem Relation Age of Onset  . Heart disease Father        pacemaker,arrythmias  . Lung disease Father   . Asthma Other   . Coronary artery  disease Other        Female 1st degree relative <50  . Lung cancer Other   . Prostate cancer Other        1st degree relative <50  . COPD Maternal Grandmother   . Breast cancer Maternal Grandmother   . Heart disease Maternal Grandfather   . Liver disease Paternal Grandmother   . Heart disease Paternal Grandfather     Social History Social History   Tobacco Use  . Smoking status: Never Smoker  . Smokeless tobacco: Never Used  Vaping Use  . Vaping Use: Never used  Substance Use Topics  . Alcohol use: No    Alcohol/week: 0.0 standard drinks  . Drug use: No    Review of Systems  Constitutional: No fever/chills Eyes: No visual changes.  ENT: No sore throat. Cardiovascular: As above Respiratory: Denies shortness of breath. Gastrointestinal: No abdominal pain.  No nausea, no vomiting.   Genitourinary: Negative for dysuria. Musculoskeletal: Negative for back pain. Skin: Negative for rash. Neurological: Negative for headaches   ____________________________________________   PHYSICAL EXAM:  VITAL SIGNS: ED Triage Vitals  Enc Vitals Group     BP 01/08/21 1011 139/74     Pulse Rate 01/08/21 1011 78     Resp 01/08/21 1011 20     Temp 01/08/21 1011 97.8 F (36.6 C)     Temp Source 01/08/21 1011 Oral     SpO2 01/08/21 1011 97 %     Weight 01/08/21 1012 93 kg (205 lb)     Height 01/08/21 1012 1.676 m (5\' 6" )     Head Circumference --      Peak Flow --      Pain Score 01/08/21 1012 4     Pain Loc --      Pain Edu? --      Excl. in GC? --     Constitutional: Alert and oriented. No acute distress. Pleasant and interactive  Nose: No congestion/rhinnorhea. Mouth/Throat: Mucous membranes are moist.    Cardiovascular: Normal rate, regular rhythm. Grossly normal heart sounds.  Good peripheral circulation. Respiratory: Normal respiratory effort.  No retractions. Lungs CTAB. Gastrointestinal: Soft and nontender. No distention.  No CVA tenderness. Genitourinary:  deferred Musculoskeletal: No lower extremity tenderness nor edema.  Warm and well perfused Neurologic:  Normal speech and language. No gross focal neurologic deficits  are appreciated.  Skin:  Skin is warm, dry and intact. No rash noted. Psychiatric: Mood and affect are normal. Speech and behavior are normal.  ____________________________________________   LABS (all labs ordered are listed, but only abnormal results are displayed)  Labs Reviewed  BASIC METABOLIC PANEL - Abnormal; Notable for the following components:      Result Value   Glucose, Bld 142 (*)    Creatinine, Ser 1.38 (*)    GFR, Estimated 46 (*)    All other components within normal limits  CBC  TROPONIN I (HIGH SENSITIVITY)  TROPONIN I (HIGH SENSITIVITY)   ____________________________________________  EKG  ED ECG REPORT I, Jene Every, the attending physician, personally viewed and interpreted this ECG.  Date: 01/08/2021  Rhythm: normal sinus rhythm QRS Axis: Left axis deviation Intervals: normal ST/T Wave abnormalities: normal Narrative Interpretation: no evidence of acute ischemia  ____________________________________________  RADIOLOGY  Chest x-ray reviewed by me, no infiltrate or effusion ____________________________________________   PROCEDURES  Procedure(s) performed: No  Procedures   Critical Care performed: No ____________________________________________   INITIAL IMPRESSION / ASSESSMENT AND PLAN / ED COURSE  Pertinent labs & imaging results that were available during my care of the patient were reviewed by me and considered in my medical decision making (see chart for details).  Patient well-appearing and in no acute distress, vital signs reassuring, exam is reassuring.  EKG is unremarkable.  Differential includes myocarditis/pericarditis, less likely ACS, no shortness of breath or pleurisy to suggest PE, not consistent with dissection.  Likely related to Humira side  effect  High sensitive troponin is undetectable.  CBC is normal.  Patient does have a history of chronic kidney disease, creatinine is in line with that.  Chest x-ray without acute abnormality  Reassuring work-up today, appropriate for outpatient follow-up with cardiology, strict return precautions discussed with patient, she agrees with this plan.    ____________________________________________   FINAL CLINICAL IMPRESSION(S) / ED DIAGNOSES  Final diagnoses:  Atypical chest pain        Note:  This document was prepared using Dragon voice recognition software and may include unintentional dictation errors.   Jene Every, MD 01/08/21 1153

## 2021-01-08 NOTE — ED Triage Notes (Signed)
Patient to ER for c/o chest pain that started on Sunday. States she just started taking Humira for psoriatic arthritis two weeks ago (last dose was on Saturday). Denies shortness of breath or diaphoresis. +Cough for few days. +Nausea.

## 2021-01-11 ENCOUNTER — Other Ambulatory Visit (HOSPITAL_COMMUNITY): Payer: Self-pay

## 2021-01-11 MED FILL — Adalimumab Auto-injector Kit 40 MG/0.8ML: SUBCUTANEOUS | 28 days supply | Qty: 2 | Fill #0 | Status: CN

## 2021-01-12 ENCOUNTER — Other Ambulatory Visit (HOSPITAL_COMMUNITY): Payer: Self-pay

## 2021-01-16 ENCOUNTER — Other Ambulatory Visit (HOSPITAL_COMMUNITY): Payer: Self-pay

## 2021-01-18 ENCOUNTER — Other Ambulatory Visit (HOSPITAL_COMMUNITY): Payer: Self-pay

## 2021-01-18 MED FILL — Adalimumab Auto-injector Kit 40 MG/0.8ML: SUBCUTANEOUS | 28 days supply | Qty: 2 | Fill #0 | Status: AC

## 2021-01-21 ENCOUNTER — Other Ambulatory Visit (HOSPITAL_COMMUNITY): Payer: Self-pay

## 2021-01-24 ENCOUNTER — Encounter: Payer: Self-pay | Admitting: Family Medicine

## 2021-01-24 ENCOUNTER — Ambulatory Visit (INDEPENDENT_AMBULATORY_CARE_PROVIDER_SITE_OTHER): Payer: No Typology Code available for payment source | Admitting: Family Medicine

## 2021-01-24 ENCOUNTER — Other Ambulatory Visit: Payer: Self-pay

## 2021-01-24 VITALS — BP 113/81 | HR 60 | Temp 98.5°F

## 2021-01-24 DIAGNOSIS — N183 Chronic kidney disease, stage 3 unspecified: Secondary | ICD-10-CM

## 2021-01-24 DIAGNOSIS — E538 Deficiency of other specified B group vitamins: Secondary | ICD-10-CM

## 2021-01-24 DIAGNOSIS — E559 Vitamin D deficiency, unspecified: Secondary | ICD-10-CM | POA: Diagnosis not present

## 2021-01-24 DIAGNOSIS — L4050 Arthropathic psoriasis, unspecified: Secondary | ICD-10-CM

## 2021-01-24 DIAGNOSIS — E7849 Other hyperlipidemia: Secondary | ICD-10-CM

## 2021-01-24 DIAGNOSIS — M549 Dorsalgia, unspecified: Secondary | ICD-10-CM

## 2021-01-24 DIAGNOSIS — R7303 Prediabetes: Secondary | ICD-10-CM

## 2021-01-24 LAB — URINALYSIS, ROUTINE W REFLEX MICROSCOPIC
Bilirubin, UA: NEGATIVE
Glucose, UA: NEGATIVE
Ketones, UA: NEGATIVE
Leukocytes,UA: NEGATIVE
Nitrite, UA: NEGATIVE
Protein,UA: NEGATIVE
RBC, UA: NEGATIVE
Specific Gravity, UA: 1.025 (ref 1.005–1.030)
Urobilinogen, Ur: 0.2 mg/dL (ref 0.2–1.0)
pH, UA: 6 (ref 5.0–7.5)

## 2021-01-24 LAB — BAYER DCA HB A1C WAIVED: HB A1C (BAYER DCA - WAIVED): 6 % (ref <7.0)

## 2021-01-24 NOTE — Assessment & Plan Note (Addendum)
Recently started on humira by rheumatology and not having a good reaction. Encouraged her to reach out to her rheumatologist. Will check labs to look for other causes of her fatigue. Call with any concerns.

## 2021-01-24 NOTE — Assessment & Plan Note (Signed)
Under good control on current regimen. Continue current regimen. Continue to monitor. Call with any concerns. Refills given. Labs drawn today.   

## 2021-01-24 NOTE — Assessment & Plan Note (Signed)
Rechecking labs today. Await results. Treat as needed.  °

## 2021-01-24 NOTE — Patient Instructions (Signed)
Voltaren gel 

## 2021-01-24 NOTE — Progress Notes (Signed)
BP 113/81   Pulse 60   Temp 98.5 F (36.9 C)   SpO2 98%    Subjective:    Patient ID: Emma Rogers, female    DOB: 03-Apr-1966, 55 y.o.   MRN: [id number]  HPI: Emma Rogers is a 55 y.o. female  Chief Complaint  Patient presents with  . medication update    Patient states she recently was prescribed Humira. Patient states she is having side effects from medication. Patient feels very fatigue for a few days after taking a dose. Has had 3 doses so far.    Recently started on humira by the rheumatologist. She has taken it 3x and has had bad reactions to each of the injections. The first time, she felt like she had a the flu, but she was better within about 24 hours. The 2nd time she was feeling so poorly she was sent to the ER. Everything was OK on her blood work and her CXR was normal. She notes that it took several days to recover following her injection. Her 3rd shot gave her horrible leg cramps for several days. She was not sure if this was normal with humira and she was concerned about it. She has not spoken to her rheumatologist about this. She does note that when she went to the ER her GFR had dropped by 10 and she is quiet concerned about this.   She has been having pain in her L side for several months. She notes that is seems to be worse at night. It's aching and sore. Worse with laying down and better during the day. It has not been radiating. She has not tried anything for it.   HYPERLIPIDEMIA Hyperlipidemia status: excellent compliance Satisfied with current treatment?  yes Side effects:  no Medication compliance: excellent compliance Past cholesterol meds: atorvastatin Supplements: none Aspirin:  yes The 10-year ASCVD risk score Denman George DC Jr., et al., 2013) is: 1.1%   Values used to calculate the score:     Age: 65 years     Sex: Female     Is Non-Hispanic African American: No     Diabetic: No     Tobacco smoker: No     Systolic Blood Pressure: 113 mmHg     Is  BP treated: No     HDL Cholesterol: 58 mg/dL     Total Cholesterol: 185 mg/dL Chest pain:  no Coronary artery disease:  no  Impaired Fasting Glucose HbA1C:  Lab Results  Component Value Date   HGBA1C 5.5 01/12/2020   Duration of elevated blood sugar: chronic Polydipsia: no Polyuria: no Weight change: no Visual disturbance: no Glucose Monitoring: no    Accucheck frequency: Not Checking Diabetic Education: Not Completed Family history of diabetes: yes  Relevant past medical, surgical, family and social history reviewed and updated as indicated. Interim medical history since our last visit reviewed. Allergies and medications reviewed and updated.  Review of Systems  Constitutional: Positive for fatigue. Negative for activity change, appetite change, chills, diaphoresis, fever and unexpected weight change.  Respiratory: Negative.   Cardiovascular: Negative.   Gastrointestinal: Negative.   Musculoskeletal: Negative.   Neurological: Negative.   Psychiatric/Behavioral: Negative.     Per HPI unless specifically indicated above     Objective:    BP 113/81   Pulse 60   Temp 98.5 F (36.9 C)   SpO2 98%   Wt Readings from Last 3 Encounters:  01/08/21 205 lb (93 kg)  10/23/20 213 lb 9.6  oz (96.9 kg)  03/19/20 226 lb (102.5 kg)    Physical Exam Vitals and nursing note reviewed.  Constitutional:      General: She is not in acute distress.    Appearance: Normal appearance. She is not ill-appearing, toxic-appearing or diaphoretic.  HENT:     Head: Normocephalic and atraumatic.     Right Ear: External ear normal.     Left Ear: External ear normal.     Nose: Nose normal.     Mouth/Throat:     Mouth: Mucous membranes are moist.     Pharynx: Oropharynx is clear.  Eyes:     General: No scleral icterus.       Right eye: No discharge.        Left eye: No discharge.     Extraocular Movements: Extraocular movements intact.     Conjunctiva/sclera: Conjunctivae normal.      Pupils: Pupils are equal, round, and reactive to light.  Cardiovascular:     Rate and Rhythm: Normal rate and regular rhythm.     Pulses: Normal pulses.     Heart sounds: Normal heart sounds. No murmur heard. No friction rub. No gallop.   Pulmonary:     Effort: Pulmonary effort is normal. No respiratory distress.     Breath sounds: Normal breath sounds. No stridor. No wheezing, rhonchi or rales.  Chest:     Chest wall: No tenderness.  Musculoskeletal:        General: Normal range of motion.     Cervical back: Normal range of motion and neck supple.  Skin:    General: Skin is warm and dry.     Capillary Refill: Capillary refill takes less than 2 seconds.     Coloration: Skin is not jaundiced or pale.     Findings: No bruising, erythema, lesion or rash.  Neurological:     General: No focal deficit present.     Mental Status: She is alert and oriented to person, place, and time. Mental status is at baseline.  Psychiatric:        Mood and Affect: Mood normal.        Behavior: Behavior normal.        Thought Content: Thought content normal.        Judgment: Judgment normal.     Results for orders placed or performed during the hospital encounter of 01/08/21  Basic metabolic panel  Result Value Ref Range   Sodium 137 135 - 145 mmol/L   Potassium 3.9 3.5 - 5.1 mmol/L   Chloride 101 98 - 111 mmol/L   CO2 28 22 - 32 mmol/L   Glucose, Bld 142 (H) 70 - 99 mg/dL   BUN 20 6 - 20 mg/dL   Creatinine, Ser 0.10 (H) 0.44 - 1.00 mg/dL   Calcium 9.4 8.9 - 27.2 mg/dL   GFR, Estimated 46 (L) >60 mL/min   Anion gap 8 5 - 15  CBC  Result Value Ref Range   WBC 5.8 4.0 - 10.5 K/uL   RBC 4.57 3.87 - 5.11 MIL/uL   Hemoglobin 14.4 12.0 - 15.0 g/dL   HCT 53.6 64.4 - 03.4 %   MCV 91.7 80.0 - 100.0 fL   MCH 31.5 26.0 - 34.0 pg   MCHC 34.4 30.0 - 36.0 g/dL   RDW 74.2 59.5 - 63.8 %   Platelets 262 150 - 400 K/uL   nRBC 0.0 0.0 - 0.2 %  Troponin I (High Sensitivity)  Result Value Ref Range  Troponin I (High Sensitivity) <2 <18 ng/L      Assessment & Plan:   Problem List Items Addressed This Visit      Musculoskeletal and Integument   Psoriatic arthritis (HCC) - Primary    Recently started on humira by rheumatology and not having a good reaction. Encouraged her to reach out to her rheumatologist. Will check labs to look for other causes of her fatigue. Call with any concerns.         Genitourinary   Chronic kidney disease, stage III (moderate) (HCC)    Rechecking labs today. Await results. Treat as needed.      Relevant Orders   Urinalysis, Routine w reflex microscopic   CBC with Differential/Platelet     Other   Prediabetes    Rechecking labs today. Await results. Treat as needed.      Relevant Orders   Comprehensive metabolic panel   Bayer DCA Hb U1J Waived   Vitamin D deficiency    Rechecking labs today. Await results. Treat as needed.      Relevant Orders   VITAMIN D 25 Hydroxy (Vit-D Deficiency, Fractures)   Vitamin B12 deficiency    Rechecking labs today. Await results. Treat as needed.      Familial hyperlipidemia    Under good control on current regimen. Continue current regimen. Continue to monitor. Call with any concerns. Refills given. Labs drawn today.       Relevant Orders   Comprehensive metabolic panel   Lipid Panel w/o Chol/HDL Ratio    Other Visit Diagnoses    Upper back pain       CXR normal in April- will check thoracic x-ray- start voltaren PRN. Call with any concerns.    Relevant Orders   DG Thoracic Spine W/Swimmers       Follow up plan: Return ASAP for physical.

## 2021-01-25 ENCOUNTER — Other Ambulatory Visit: Payer: Self-pay

## 2021-01-25 LAB — COMPREHENSIVE METABOLIC PANEL
ALT: 19 IU/L (ref 0–32)
AST: 18 IU/L (ref 0–40)
Albumin/Globulin Ratio: 1.7 (ref 1.2–2.2)
Albumin: 4.5 g/dL (ref 3.8–4.9)
Alkaline Phosphatase: 63 IU/L (ref 44–121)
BUN/Creatinine Ratio: 20 (ref 9–23)
BUN: 21 mg/dL (ref 6–24)
Bilirubin Total: 0.3 mg/dL (ref 0.0–1.2)
CO2: 24 mmol/L (ref 20–29)
Calcium: 9.6 mg/dL (ref 8.7–10.2)
Chloride: 100 mmol/L (ref 96–106)
Creatinine, Ser: 1.05 mg/dL — ABNORMAL HIGH (ref 0.57–1.00)
Globulin, Total: 2.6 g/dL (ref 1.5–4.5)
Glucose: 103 mg/dL — ABNORMAL HIGH (ref 65–99)
Potassium: 4.6 mmol/L (ref 3.5–5.2)
Sodium: 139 mmol/L (ref 134–144)
Total Protein: 7.1 g/dL (ref 6.0–8.5)
eGFR: 64 mL/min/{1.73_m2} (ref >59)

## 2021-01-25 LAB — CBC WITH DIFFERENTIAL/PLATELET
Basophils Absolute: 0 10*3/uL (ref 0.0–0.2)
Basos: 1 %
EOS (ABSOLUTE): 0.2 10*3/uL (ref 0.0–0.4)
Eos: 3 %
Hematocrit: 43.4 % (ref 34.0–46.6)
Hemoglobin: 14.4 g/dL (ref 11.1–15.9)
Immature Grans (Abs): 0 10*3/uL (ref 0.0–0.1)
Immature Granulocytes: 0 %
Lymphocytes Absolute: 2.1 10*3/uL (ref 0.7–3.1)
Lymphs: 30 %
MCH: 31.2 pg (ref 26.6–33.0)
MCHC: 33.2 g/dL (ref 31.5–35.7)
MCV: 94 fL (ref 79–97)
Monocytes Absolute: 0.4 10*3/uL (ref 0.1–0.9)
Monocytes: 5 %
Neutrophils Absolute: 4.4 10*3/uL (ref 1.4–7.0)
Neutrophils: 61 %
Platelets: 259 10*3/uL (ref 150–450)
RBC: 4.62 x10E6/uL (ref 3.77–5.28)
RDW: 12.7 % (ref 11.7–15.4)
WBC: 7.2 10*3/uL (ref 3.4–10.8)

## 2021-01-25 LAB — LIPID PANEL W/O CHOL/HDL RATIO
Cholesterol, Total: 212 mg/dL — ABNORMAL HIGH (ref 100–199)
HDL: 61 mg/dL (ref >39)
LDL Chol Calc (NIH): 135 mg/dL — ABNORMAL HIGH (ref 0–99)
Triglycerides: 92 mg/dL (ref 0–149)
VLDL Cholesterol Cal: 16 mg/dL (ref 5–40)

## 2021-01-25 LAB — VITAMIN D 25 HYDROXY (VIT D DEFICIENCY, FRACTURES): Vit D, 25-Hydroxy: 41.8 ng/mL (ref 30.0–100.0)

## 2021-01-25 MED FILL — Albuterol Sulfate Inhal Aero 108 MCG/ACT (90MCG Base Equiv): RESPIRATORY_TRACT | 30 days supply | Qty: 18 | Fill #0 | Status: AC

## 2021-01-25 MED FILL — Furosemide Tab 20 MG: ORAL | 30 days supply | Qty: 30 | Fill #0 | Status: AC

## 2021-02-01 ENCOUNTER — Encounter: Payer: Self-pay | Admitting: Family Medicine

## 2021-02-01 ENCOUNTER — Ambulatory Visit (INDEPENDENT_AMBULATORY_CARE_PROVIDER_SITE_OTHER): Payer: No Typology Code available for payment source | Admitting: Family Medicine

## 2021-02-01 ENCOUNTER — Telehealth: Payer: Self-pay | Admitting: Family Medicine

## 2021-02-01 ENCOUNTER — Ambulatory Visit: Payer: No Typology Code available for payment source | Attending: Internal Medicine

## 2021-02-01 DIAGNOSIS — J029 Acute pharyngitis, unspecified: Secondary | ICD-10-CM | POA: Diagnosis not present

## 2021-02-01 DIAGNOSIS — Z20822 Contact with and (suspected) exposure to covid-19: Secondary | ICD-10-CM

## 2021-02-01 MED ORDER — LIDOCAINE VISCOUS HCL 2 % MT SOLN: 15.0000 mL | OROMUCOSAL | 0 refills | Status: DC | PRN

## 2021-02-01 MED ORDER — BENZONATATE 200 MG PO CAPS: 200.0000 mg | ORAL_CAPSULE | Freq: Two times a day (BID) | ORAL | 0 refills | Status: DC | PRN

## 2021-02-01 NOTE — Telephone Encounter (Signed)
Please let her know that her strep and flu were negative, and to let me know as soon as she hears about the COVID test.

## 2021-02-01 NOTE — Telephone Encounter (Signed)
Patient is aware and will send you a mychart message about the COVID test.

## 2021-02-01 NOTE — Progress Notes (Signed)
There were no vitals taken for this visit.   Subjective:    Patient ID: Emma Rogers, female    DOB: 1965-12-11, 55 y.o.   MRN: [id number]  HPI: Emma Rogers is a 55 y.o. female  Chief Complaint  Patient presents with  . Fever    Patient states she is having fever, chills, body aches.  . Sore Throat    Patient states she is having sore throat, hurts to swallow. Patient states left ear is also hurting. Symptoms began yesterday.    UPPER RESPIRATORY TRACT INFECTION- went to go get COVID test this AM Duration: 1 day Worst symptom: sore throat, chills, sweats Fever: yes Cough: no Shortness of breath: no Wheezing: no Chest pain: no Chest tightness: yes Chest congestion: no Nasal congestion: yes Runny nose: yes Post nasal drip: yes Sneezing: no Sore throat: yes Swollen glands: no Sinus pressure: yes Headache: yes Face pain: no Toothache: no Ear pain: yes left Ear pressure: no  Eyes red/itching:no Eye drainage/crusting: no  Vomiting: no Rash: no Fatigue: yes Sick contacts: yes Strep contacts: no  Context: worse Recurrent sinusitis: no Relief with OTC cold/cough medications: no  Treatments attempted: tessalon    Relevant past medical, surgical, family and social history reviewed and updated as indicated. Interim medical history since our last visit reviewed. Allergies and medications reviewed and updated.  Review of Systems  Constitutional: Positive for chills, diaphoresis, fatigue and fever. Negative for activity change, appetite change and unexpected weight change.  HENT: Positive for congestion, ear pain, postnasal drip, rhinorrhea and sore throat. Negative for dental problem, drooling, ear discharge, facial swelling, hearing loss, mouth sores, nosebleeds, sinus pressure, sinus pain, sneezing, trouble swallowing and voice change.   Eyes: Negative.   Respiratory: Negative.   Cardiovascular: Negative.   Gastrointestinal: Negative.    Psychiatric/Behavioral: Negative.     Per HPI unless specifically indicated above     Objective:    There were no vitals taken for this visit.  Wt Readings from Last 3 Encounters:  01/08/21 205 lb (93 kg)  10/23/20 213 lb 9.6 oz (96.9 kg)  03/19/20 226 lb (102.5 kg)    Physical Exam Vitals and nursing note reviewed.  Constitutional:      General: She is not in acute distress.    Appearance: Normal appearance. She is ill-appearing. She is not toxic-appearing or diaphoretic.  HENT:     Head: Normocephalic and atraumatic.     Right Ear: External ear normal.     Left Ear: External ear normal.     Nose: Nose normal.     Mouth/Throat:     Mouth: Mucous membranes are moist.     Pharynx: Oropharynx is clear.  Eyes:     General: No scleral icterus.       Right eye: No discharge.        Left eye: No discharge.     Conjunctiva/sclera: Conjunctivae normal.     Pupils: Pupils are equal, round, and reactive to light.  Pulmonary:     Effort: Pulmonary effort is normal. No respiratory distress.     Comments: Speaking in full sentences Musculoskeletal:        General: Normal range of motion.     Cervical back: Normal range of motion.  Skin:    Coloration: Skin is not jaundiced or pale.     Findings: No bruising, erythema, lesion or rash.  Neurological:     Mental Status: She is alert and oriented to person, place,  and time. Mental status is at baseline.  Psychiatric:        Mood and Affect: Mood normal.        Behavior: Behavior normal.        Thought Content: Thought content normal.        Judgment: Judgment normal.     Results for orders placed or performed in visit on 01/24/21  Urinalysis, Routine w reflex microscopic  Result Value Ref Range   Specific Gravity, UA 1.025 1.005 - 1.030   pH, UA 6.0 5.0 - 7.5   Color, UA Yellow Yellow   Appearance Ur Clear Clear   Leukocytes,UA Negative Negative   Protein,UA Negative Negative/Trace   Glucose, UA Negative Negative    Ketones, UA Negative Negative   RBC, UA Negative Negative   Bilirubin, UA Negative Negative   Urobilinogen, Ur 0.2 0.2 - 1.0 mg/dL   Nitrite, UA Negative Negative  VITAMIN D 25 Hydroxy (Vit-D Deficiency, Fractures)  Result Value Ref Range   Vit D, 25-Hydroxy 41.8 30.0 - 100.0 ng/mL  Comprehensive metabolic panel  Result Value Ref Range   Glucose 103 (H) 65 - 99 mg/dL   BUN 21 6 - 24 mg/dL   Creatinine, Ser 1.05 (H) 0.57 - 1.00 mg/dL   eGFR 64 >59 mL/min/1.73   BUN/Creatinine Ratio 20 9 - 23   Sodium 139 134 - 144 mmol/L   Potassium 4.6 3.5 - 5.2 mmol/L   Chloride 100 96 - 106 mmol/L   CO2 24 20 - 29 mmol/L   Calcium 9.6 8.7 - 10.2 mg/dL   Total Protein 7.1 6.0 - 8.5 g/dL   Albumin 4.5 3.8 - 4.9 g/dL   Globulin, Total 2.6 1.5 - 4.5 g/dL   Albumin/Globulin Ratio 1.7 1.2 - 2.2   Bilirubin Total 0.3 0.0 - 1.2 mg/dL   Alkaline Phosphatase 63 44 - 121 IU/L   AST 18 0 - 40 IU/L   ALT 19 0 - 32 IU/L  Lipid Panel w/o Chol/HDL Ratio  Result Value Ref Range   Cholesterol, Total 212 (H) 100 - 199 mg/dL   Triglycerides 92 0 - 149 mg/dL   HDL 61 >39 mg/dL   VLDL Cholesterol Cal 16 5 - 40 mg/dL   LDL Chol Calc (NIH) 135 (H) 0 - 99 mg/dL  CBC with Differential/Platelet  Result Value Ref Range   WBC 7.2 3.4 - 10.8 x10E3/uL   RBC 4.62 3.77 - 5.28 x10E6/uL   Hemoglobin 14.4 11.1 - 15.9 g/dL   Hematocrit 43.4 34.0 - 46.6 %   MCV 94 79 - 97 fL   MCH 31.2 26.6 - 33.0 pg   MCHC 33.2 31.5 - 35.7 g/dL   RDW 12.7 11.7 - 15.4 %   Platelets 259 150 - 450 x10E3/uL   Neutrophils 61 Not Estab. %   Lymphs 30 Not Estab. %   Monocytes 5 Not Estab. %   Eos 3 Not Estab. %   Basos 1 Not Estab. %   Neutrophils Absolute 4.4 1.4 - 7.0 x10E3/uL   Lymphocytes Absolute 2.1 0.7 - 3.1 x10E3/uL   Monocytes Absolute 0.4 0.1 - 0.9 x10E3/uL   EOS (ABSOLUTE) 0.2 0.0 - 0.4 x10E3/uL   Basophils Absolute 0.0 0.0 - 0.2 x10E3/uL   Immature Granulocytes 0 Not Estab. %   Immature Grans (Abs) 0.0 0.0 - 0.1 x10E3/uL   Bayer DCA Hb A1c Waived  Result Value Ref Range   HB A1C (BAYER DCA - WAIVED) 6.0 <7.0 %  Assessment & Plan:   Problem List Items Addressed This Visit   None   Visit Diagnoses    Sore throat    -  Primary   Await COVID test. Will swab for flu and strep. Viscous lidocaine and tessalon- will treat as needed based on results. Await results.    Relevant Orders   Rapid Strep Screen ([facility] ONLY)   Veritor Flu A/B Waived       Follow up plan: Return if symptoms worsen or fail to improve.   . This visit was completed via video visit through MyChart due to the restrictions of the COVID-19 pandemic. All issues as above were discussed and addressed. Physical exam was done as above through visual confirmation on video through MyChart. If it was felt that the patient should be evaluated in the office, they were directed there. The patient verbally consented to this visit. . Location of the patient: home . Location of the provider: work . Those involved with this call:  . Provider: Park Liter, DO . CMA: Louanna Raw, [place] . Front Desk/Registration: Jill Side  . Time spent on call: 15 minutes with patient face to face via video conference. More than 50% of this time was spent in counseling and coordination of care. 23 minutes total spent in review of patient's record and preparation of their chart.

## 2021-02-02 ENCOUNTER — Encounter: Payer: Self-pay | Admitting: Family Medicine

## 2021-02-02 LAB — VERITOR FLU A/B WAIVED
Influenza A: NEGATIVE
Influenza B: NEGATIVE

## 2021-02-02 LAB — SARS-COV-2, NAA 2 DAY TAT

## 2021-02-02 LAB — NOVEL CORONAVIRUS, NAA: SARS-CoV-2, NAA: NOT DETECTED

## 2021-02-04 ENCOUNTER — Ambulatory Visit: Payer: Self-pay | Admitting: *Deleted

## 2021-02-04 ENCOUNTER — Other Ambulatory Visit: Payer: Self-pay

## 2021-02-04 LAB — RAPID STREP SCREEN (MED CTR MEBANE ONLY): Strep Gp A Ag, IA W/Reflex: NEGATIVE

## 2021-02-04 LAB — CULTURE, GROUP A STREP: Strep A Culture: POSITIVE — AB

## 2021-02-04 MED ORDER — DOXYCYCLINE HYCLATE 100 MG PO TABS
100.0000 mg | ORAL_TABLET | Freq: Two times a day (BID) | ORAL | 0 refills | Status: DC
  Filled 2021-02-04: qty 20, 10d supply, fill #0

## 2021-02-04 NOTE — Telephone Encounter (Signed)
Per initial encounter, "Pt called stating that her throat is very sore. She states that her throat is still very sore and has not improved. Pt does not know what to do because she states she feels very bad and just wants to feel better"; pt called back stating her symptoms started on 01/31/21; sh had a virtual visit with Dr Olevia Perches on 02/01/21; the pt says her COVID, Flu, and Strep tests were negative; the pt takes Humira; she has had a fever since the onset of symptoms and it is now 100.5; she has taken Advil; she has only been only eat cool whip and watermelon; the pt also says it is painful for her to talk; she says Lidocaine and Tessalon has helped;  thept says she also say white patches on her tonsils on 02/03/21; recommendations made per nurse triage protocol; the pt would like to know what Dr Laural Benes would like for her to do next; spoke with Maximino Sarin and she requests this be sent to the office for provider review; pt notified and can be contacted at [phone]; will route per request.    Reason for Disposition . SEVERE (e.g., excruciating) throat pain  Answer Assessment - Initial Assessment Questions 1. ONSET: "When did the throat start hurting?" (Hours or days ago)      01/31/21 2. SEVERITY: "How bad is the sore throat?" (Scale 1-10; mild, moderate or severe)   - MILD (1-3):  doesn't interfere with eating or normal activities   - MODERATE (4-7): interferes with eating some solids and normal activities   - SEVERE (8-10):  excruciating pain, interferes with most normal activities   - SEVERE DYSPHAGIA: can't swallow liquids, drooling     severe 3. STREP EXPOSURE: "Has there been any exposure to strep within the past week?" If Yes, ask: "What type of contact occurred?"      no 4.  VIRAL SYMPTOMS: "Are there any symptoms of a cold, such as a runny nose, cough, hoarse voice or red eyes?"      Red eyes 5. FEVER: "Do you have a fever?" If Yes, ask: "What is your temperature, how was it  measured, and when did it start?"     Yes temp 100.5; taken advil 6. PUS ON THE TONSILS: "Is there pus on the tonsils in the back of your throat?"   White patches on 02/03/21 7. OTHER SYMPTOMS: "Do you have any other symptoms?" (e.g., difficulty breathing, headache, rash)  difficulty swallowing (painful), cough 8. PREGNANCY: "Is there any chance you are pregnant?" "When was your last menstrual period?"     No hysterectomy  Protocols used: SORE THROAT-A-AH

## 2021-02-04 NOTE — Addendum Note (Signed)
Addended by: Dorcas Carrow on: 02/04/2021 04:16 PM   Modules accepted: Orders

## 2021-02-04 NOTE — Telephone Encounter (Signed)
Patient aware of provider sending in RX. Patient strep culture came back positive.

## 2021-02-04 NOTE — Telephone Encounter (Signed)
Pt called stating that her throat is very sore. She states that her throat is still very sore and has not improved. Pt is requesting to have an in office visit. Please advise.

## 2021-02-04 NOTE — Telephone Encounter (Signed)
Unfortunately, it's very likely that this is a virus and will need to run it's course. I've sent her in an antibiotic, but it may not help. If she's getting worse, let us know, otherwise continue with the lidocaine.

## 2021-02-04 NOTE — Telephone Encounter (Signed)
Please advise pt is wanting in office visit

## 2021-02-07 ENCOUNTER — Other Ambulatory Visit (HOSPITAL_COMMUNITY): Payer: Self-pay

## 2021-02-08 ENCOUNTER — Encounter: Payer: No Typology Code available for payment source | Admitting: Family Medicine

## 2021-02-13 ENCOUNTER — Encounter: Payer: Self-pay | Admitting: Family Medicine

## 2021-02-14 ENCOUNTER — Encounter: Payer: Self-pay | Admitting: Nurse Practitioner

## 2021-02-14 ENCOUNTER — Other Ambulatory Visit (HOSPITAL_COMMUNITY): Payer: Self-pay

## 2021-02-14 ENCOUNTER — Telehealth (INDEPENDENT_AMBULATORY_CARE_PROVIDER_SITE_OTHER): Payer: No Typology Code available for payment source | Admitting: Nurse Practitioner

## 2021-02-14 ENCOUNTER — Other Ambulatory Visit: Payer: Self-pay

## 2021-02-14 VITALS — Wt 205.0 lb

## 2021-02-14 DIAGNOSIS — U071 COVID-19: Secondary | ICD-10-CM | POA: Insufficient documentation

## 2021-02-14 MED ORDER — MOLNUPIRAVIR EUA 200MG CAPSULE
4.0000 | ORAL_CAPSULE | Freq: Two times a day (BID) | ORAL | 0 refills | Status: AC
  Filled 2021-02-14: qty 40, 5d supply, fill #0

## 2021-02-14 NOTE — Patient Instructions (Signed)
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
## 2021-02-14 NOTE — Assessment & Plan Note (Signed)
Symptoms started yesterday 02/13/21 and she tested positive last night. She is vaccinated but has a history of asthma and is taking humira. With shared decision making, will send in prescription for molnupravir. She can continue taking advil/tylenol to help with fevers and headache. Encouraged fluids and rest. She needs to isolate for 5 days and then if her symptoms resolve, she needs to wear a mask while out in public for another 5 days. Discussed ER precautions. Follow-up if symptoms worsen or do not improve.

## 2021-02-22 ENCOUNTER — Telehealth: Payer: No Typology Code available for payment source | Admitting: Family Medicine

## 2021-02-22 ENCOUNTER — Other Ambulatory Visit (HOSPITAL_COMMUNITY): Payer: Self-pay

## 2021-02-25 ENCOUNTER — Other Ambulatory Visit: Payer: Self-pay

## 2021-02-25 ENCOUNTER — Telehealth (INDEPENDENT_AMBULATORY_CARE_PROVIDER_SITE_OTHER): Payer: No Typology Code available for payment source | Admitting: Internal Medicine

## 2021-02-25 ENCOUNTER — Encounter: Payer: Self-pay | Admitting: Internal Medicine

## 2021-02-25 DIAGNOSIS — U071 COVID-19: Secondary | ICD-10-CM | POA: Diagnosis not present

## 2021-02-25 MED ORDER — BENZONATATE 100 MG PO CAPS
100.0000 mg | ORAL_CAPSULE | Freq: Three times a day (TID) | ORAL | 0 refills | Status: DC | PRN
  Filled 2021-02-25: qty 20, 7d supply, fill #0

## 2021-02-25 NOTE — Progress Notes (Signed)
There were no vitals taken for this visit.   Subjective:    Patient ID: Emma Rogers, female    DOB: 20-Oct-1965, 55 y.o.   MRN: [id number]  Chief Complaint  Patient presents with   Cough    Tested positive on 5/24    Fever    Continuing to have fever ranging from 99 to 100.4, Usually going up in the evening.    HPI: Emma Rogers is a 55 y.o. female  I connected with  Emma Rogers on 02/25/21 by a video enabled telemedicine application and verified that I am speaking with the correct person using two identifiers.   I discussed the limitations of evaluation and management by telemedicine. The patient expressed understanding and agreed to proceed. "I discussed the limitations of evaluation and management by telemedicine and the availability of in person appointments. The patient expressed understanding and agreed to proceed"      This visit was completed via MyChart due to the restrictions of the COVID-19 pandemic. All issues as above were discussed and addressed. Physical exam was done as above through visual confirmation on MyChart. If it was felt that the patient should be evaluated in the office, they were directed there. The patient verbally consented to this visit. Location of the patient: home Location of the provider: work Those involved with this call:  Provider: Loura Pardon, MD CMA: Tristan Schroeder, CMA Front Desk/Registration: Beverely Pace  Time spent on call: 10 minutes with patient face to face via video conference. More than 50% of this time was spent in counseling and coordination of care. 10 minutes total spent in review of patient's record and preparation of their chart.    Cough This is a recurrent (had covid 19 1 week ago. 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 ) problem. The current episode started in the past 7 days. The cough is non-productive. Pertinent negatives include no chest pain, chills, ear congestion, ear  pain, fever, headaches, heartburn, hemoptysis, myalgias, nasal congestion, postnasal drip, rash, rhinorrhea, sore throat, shortness of breath, sweats, weight loss or wheezing.  Fever  Associated symptoms include coughing. Pertinent negatives include no chest pain, ear pain, headaches, rash, sore throat or wheezing.    Chief Complaint  Patient presents with   Cough    Tested positive on 5/24    Fever    Continuing to have fever ranging from 99 to 100.4, Usually going up in the evening.    Relevant past medical, surgical, family and social history reviewed and updated as indicated. Interim medical history since our last visit reviewed. Allergies and medications reviewed and updated.  Review of Systems  Constitutional: Negative for chills, fever and weight loss.  HENT: Negative for ear pain, postnasal drip, rhinorrhea and sore throat.   Respiratory: Positive for cough. Negative for hemoptysis, shortness of breath and wheezing.   Cardiovascular: Negative for chest pain.  Gastrointestinal: Negative for heartburn.  Musculoskeletal: Negative for myalgias.  Skin: Negative for rash.  Neurological: Negative for headaches.    Per HPI unless specifically indicated above     Objective:    There were no vitals taken for this visit.  Wt Readings from Last 3 Encounters:  02/14/21 205 lb (93 kg)  01/08/21 205 lb (93 kg)  10/23/20 213 lb 9.6 oz (96.9 kg)    Physical Exam Vitals reviewed: not perfmed sec to virtual visit.     Results for orders placed or performed in  visit on 02/01/21  Rapid Strep Screen ([facility] ONLY)   Specimen: Other   Other  Result Value Ref Range   Strep Gp A Ag, IA W/Reflex Negative Negative  Culture, Group A Strep   Other  Result Value Ref Range   Strep A Culture Positive (A)   Veritor Flu A/B Waived  Result Value Ref Range   Influenza A Negative Negative   Influenza B Negative Negative        Current Outpatient Medications:    albuterol  (VENTOLIN HFA) 108 (90 Base) MCG/ACT inhaler, INHALE 2 PUFFS INTO THE LUNGS EVERY 6 (SIX) HOURS AS NEEDED FOR WHEEZING OR SHORTNESS OF BREATH., Disp: 18 g, Rfl: 2   atorvastatin (LIPITOR) 40 MG tablet, Take 1 tablet (40 mg total) by mouth at bedtime., Disp: 90 tablet, Rfl: 1   betamethasone dipropionate (DIPROLENE) 0.05 % ointment, APPLY TOPICALLY 2 TIMES DAILY AS NEEDED (RASH) TO AFFECTED AREAS BODY FOR UP TO 3 WEEKS. AVOID APPLYING TO FACE, GROIN, AND AXILLA., Disp: 45 g, Rfl: 2   budesonide-formoterol (SYMBICORT) 80-4.5 MCG/ACT inhaler, Inhale 2 puffs into the lungs 2 (two) times daily., Disp: 1 Inhaler, Rfl: 12   buPROPion (WELLBUTRIN XL) 300 MG 24 hr tablet, TAKE 1 TABLET (300 MG TOTAL) BY MOUTH DAILY., Disp: 90 tablet, Rfl: 1   Cyanocobalamin 1000 MCG CAPS, Take 1 capsule by mouth daily., Disp: 30 capsule, Rfl: 12   furosemide (LASIX) 20 MG tablet, TAKE 1 TABLET BY MOUTH DAILY., Disp: 30 tablet, Rfl: 6   ipratropium (ATROVENT) 0.03 % nasal spray, PLACE 2 SPRAYS INTO BOTH NOSTRILS EVERY 12 HOURS., Disp: 30 mL, Rfl: 0   rizatriptan (MAXALT) 10 MG tablet, Take 1 tablet (10 mg total) by mouth as needed for migraine. May repeat in 2 hours if needed, Disp: 10 tablet, Rfl: 1   traZODone (DESYREL) 100 MG tablet, TAKE 1/2 TO 1 TABLET BY MOUTH AT BEDTIME AS NEEDED FOR SLEEP, Disp: 90 tablet, Rfl: 0   VITAMIN D PO, Take 1 tablet by mouth daily., Disp: , Rfl:    Adalimumab 40 MG/0.8ML PNKT, INJECT 40MG  UNDER THE SKIN EVERY 2 WEEKS AS DIRECTED. (Patient not taking: Reported on 02/14/2021), Disp: 2 each, Rfl: 12   hydrocortisone 2.5 % cream, APPLY TOPICALLY 2 (TWO) TIMES DAILY AS NEEDED (RASH). AFFECTED AREAS FACE, GROIN, UNDERARMS FOR UP TO 3 WEEKS (Patient not taking: Reported on 02/25/2021), Disp: 28 g, Rfl: 2    Assessment & Plan:  COVID : positive :x 7 days ago  Pt advised to  Increase fluid intake. Has a low grade fever now.  Headahce - tyelnol every 4-6 hrs prn and alternate this with ibubrufen 800 mg  q 8 hrly. Sinus pressure: use steam inhalation.  OTC -  Allegra / claritin.  5 days quarantine.  Ok to rtw in 5 days if tests -ve follow Sees Dr. 04/27/2021 for such. Takes humira for psoriatic arthritis rheumatology.

## 2021-03-05 ENCOUNTER — Encounter: Payer: Self-pay | Admitting: Emergency Medicine

## 2021-03-05 ENCOUNTER — Other Ambulatory Visit: Payer: Self-pay

## 2021-03-05 ENCOUNTER — Ambulatory Visit
Admission: EM | Admit: 2021-03-05 | Discharge: 2021-03-05 | Disposition: A | Discharge status: Home / Self Care | Payer: No Typology Code available for payment source

## 2021-03-05 DIAGNOSIS — R112 Nausea with vomiting, unspecified: Secondary | ICD-10-CM | POA: Diagnosis not present

## 2021-03-05 DIAGNOSIS — G43909 Migraine, unspecified, not intractable, without status migrainosus: Secondary | ICD-10-CM

## 2021-03-05 MED ORDER — ONDANSETRON 4 MG PO TBDP
4.0000 mg | ORAL_TABLET | Freq: Three times a day (TID) | ORAL | 0 refills | Status: DC | PRN
  Filled 2021-03-05: qty 20, 7d supply, fill #0

## 2021-03-05 MED ORDER — DEXAMETHASONE SODIUM PHOSPHATE 10 MG/ML IJ SOLN
10.0000 mg | Freq: Once | INTRAMUSCULAR | Status: AC
  Administered 2021-03-05: 10 mg via INTRAMUSCULAR

## 2021-03-05 MED ORDER — ONDANSETRON 4 MG PO TBDP
4.0000 mg | ORAL_TABLET | Freq: Once | ORAL | Status: AC
  Administered 2021-03-05: 4 mg via ORAL

## 2021-03-05 MED ORDER — KETOROLAC TROMETHAMINE 30 MG/ML IJ SOLN
30.0000 mg | Freq: Once | INTRAMUSCULAR | Status: AC
  Administered 2021-03-05: 30 mg via INTRAMUSCULAR

## 2021-03-05 NOTE — ED Triage Notes (Signed)
Pt presents today with c/o of migraine with nausea that she woke up with this morning. She took Advil 400 mg this am, but vomited shortly after.

## 2021-03-05 NOTE — Discharge Instructions (Addendum)
You were given an injection of dexamethasone and ketorolac for your migraine headache today.  You were given Zofran for your nausea and vomiting.    Take the antinausea medication as directed.    Keep yourself hydrated with clear liquids, such as water, Gatorade, Pedialyte, Sprite, or ginger ale.    Go to the emergency department if you have acute worsening symptoms.    Follow up with your primary care provider if your symptoms are not improving.

## 2021-03-05 NOTE — ED Provider Notes (Signed)
Renaldo Fiddler    CSN: [id number] Arrival date & time: 03/05/21  0836      History   Chief Complaint Chief Complaint  Patient presents with   Migraine   Nausea    HPI Emma Rogers is a 55 y.o. female.  Patient presents with a "migraine" headache with nausea since she woke up this morning.  The headache is typical of her usual migraine headaches.  Worse with light and sound; improves with dark and quiet room.  She attempted treatment at home with Advil but vomited the medication so she did not attempt any other oral treatment.  She denies numbness, weakness, dizziness, chest pain, shortness of breath, abdominal pain, or other symptoms.  Recent history of COVID positive on 02/14/2021.  Her medical history includes migraine headaches, prediabetes, chronic kidney disease, psoriatic arthritis, asthma, vertigo, chronic low back pain, anxiety, depression.    The history is provided by the patient and medical records.   Past Medical History:  Diagnosis Date   Abnormal MRI 04/03/2019   Allergic rhinitis    Anxiety    Asthma    Depression    Headache(784.0)    Psoriatic arthritis (HCC)    Renal insufficiency    Stage 3 kidney disease.    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   Vertigo 06/16/2019   MDD (major depressive disorder), recurrent episode, moderate (HCC) 06/07/2019   GAD (generalized anxiety disorder) 05/26/2019   Memory deficit 04/26/2019   Chronic low back pain without sciatica 03/08/2019   Hip pain, acute, left 03/08/2019   Seizure-like activity (HCC) 03/08/2019   Moderate persistent asthma 01/27/2019   Cervical spinal stenosis 12/30/2018   Benign essential tremor 12/30/2018   Chronic venous insufficiency 03/10/2018   Lymphedema 03/10/2018   Anxiety 10/13/2017   Familial hyperlipidemia  10/10/2017   Chronic kidney disease, stage III (moderate) (HCC) 09/03/2017   Vitamin D deficiency 09/03/2017   Vitamin B12 deficiency 09/03/2017   Insomnia 08/11/2017   Menopausal symptoms 08/11/2017   Migraine without aura and without status migrainosus, not intractable 02/21/2017   Prediabetes 02/20/2017   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 [name]   Lobe hemithroidectomy     Right    OB History   No obstetric history on file.      Home Medications    Prior to Admission medications   Medication Sig Start Date End Date Taking? Authorizing Provider  ibuprofen (ADVIL) 200 MG tablet Take 200 mg by mouth every 6 (six) hours as needed.   Yes [provider]  ondansetron (ZOFRAN ODT) 4 MG disintegrating tablet Take 1 tablet (4 mg total) by mouth every 8 (eight) hours as needed for nausea or vomiting. 03/05/21  Yes Mickie Bail, NP  rizatriptan (MAXALT) 10 MG tablet Take 1 tablet (10 mg total) by mouth as needed for migraine. May repeat in 2 hours if needed 01/12/20  Yes Johnson, Megan P, DO  Adalimumab 40 MG/0.8ML PNKT INJECT 40MG  UNDER THE SKIN EVERY  2 WEEKS AS DIRECTED. Patient not taking: Reported on 02/14/2021 12/19/20 12/19/21  Quentin Angst, MD  albuterol (VENTOLIN HFA) 108 (90 Base) MCG/ACT inhaler INHALE 2 PUFFS INTO THE LUNGS EVERY 6 (SIX) HOURS AS NEEDED FOR WHEEZING OR SHORTNESS OF BREATH. 07/26/20 07/26/21  Cannady, Corrie Dandy T, NP  atorvastatin (LIPITOR) 40 MG tablet Take 1 tablet (40 mg total) by mouth at bedtime. 01/12/20   Johnson, Megan P, DO  benzonatate (TESSALON) 100 MG capsule Take 1 capsule (100 mg total) by mouth 3 (three) times daily as  needed for cough. 02/25/21   Vigg, Avanti, MD  betamethasone dipropionate (DIPROLENE) 0.05 % ointment APPLY TOPICALLY 2 TIMES DAILY AS NEEDED (RASH) TO AFFECTED AREAS BODY FOR UP TO 3 WEEKS. AVOID APPLYING TO FACE, GROIN, AND AXILLA. 10/25/20 10/25/21  Moye, [place], MD  budesonide-formoterol (SYMBICORT) 80-4.5 MCG/ACT inhaler Inhale 2 puffs into the lungs 2 (two) times daily. 12/10/18   Erin Fulling, MD  buPROPion (WELLBUTRIN XL) 300 MG 24 hr tablet TAKE 1 TABLET (300 MG TOTAL) BY MOUTH DAILY. 11/09/20   Johnson, Megan P, DO  Cyanocobalamin 1000 MCG CAPS Take 1 capsule by mouth daily. 04/26/19   Johnson, Megan P, DO  furosemide (LASIX) 20 MG tablet TAKE 1 TABLET BY MOUTH DAILY. 06/21/20 06/21/21  Johnson, Megan P, DO  hydrocortisone 2.5 % cream APPLY TOPICALLY 2 (TWO) TIMES DAILY AS NEEDED (RASH). AFFECTED AREAS FACE, GROIN, UNDERARMS FOR UP TO 3 WEEKS Patient not taking: Reported on 02/25/2021 10/25/20 10/25/21  Neale Burly, [place], MD  ipratropium (ATROVENT) 0.03 % nasal spray PLACE 2 SPRAYS INTO BOTH NOSTRILS EVERY 12 HOURS. 09/19/20 09/19/21  Roxy Horseman, PA-C  traZODone (DESYREL) 100 MG tablet TAKE 1/2 TO 1 TABLET BY MOUTH AT BEDTIME AS NEEDED FOR SLEEP 12/21/20   Johnson, Megan P, DO  VITAMIN D PO Take 1 tablet by mouth daily.    [provider]  sertraline (ZOLOFT) 100 MG tablet Take 1 tablet (100 mg total) by mouth daily. Patient not taking: Reported on 10/12/2019 05/26/19 12/30/19  [name], MD    Family History Family History  Problem Relation Age of Onset   Heart disease Father        pacemaker,arrythmias   Lung disease Father    Asthma Other    Coronary artery disease Other        Female 1st degree relative <50   Lung cancer Other    Prostate cancer Other        1st degree relative <50   COPD Maternal Grandmother    Breast cancer Maternal Grandmother    Heart disease Maternal Grandfather    Liver disease Paternal Grandmother    Heart disease Paternal Grandfather     Social  History Social History   Tobacco Use   Smoking status: Never   Smokeless tobacco: Never  Vaping Use   Vaping Use: Never used  Substance Use Topics   Alcohol use: No    Alcohol/week: 0.0 standard drinks   Drug use: No     Allergies   Amoxicillin, Clavulanic acid, and Morphine and related   Review of Systems Review of Systems  Constitutional:  Negative for chills and fever.  Respiratory:  Negative for cough and shortness of breath.   Cardiovascular:  Negative for chest pain and palpitations.  Gastrointestinal:  Positive for nausea and vomiting. Negative for abdominal pain and diarrhea.  Skin:  Negative for color change and rash.  Neurological:  Positive for headaches. Negative for dizziness, seizures, syncope, facial asymmetry,  speech difficulty, weakness and numbness.  All other systems reviewed and are negative.   Physical Exam Triage Vital Signs ED Triage Vitals  Enc Vitals Group     BP      Pulse      Resp      Temp      Temp src      SpO2      Weight      Height      Head Circumference      Peak Flow      Pain Score      Pain Loc      Pain Edu?      Excl. in GC?    No data found.  Updated Vital Signs BP 119/87 (BP Location: Left Arm)   Pulse 66   Temp 97.9 F (36.6 C) (Oral)   Resp 18   SpO2 95%   Visual Acuity Right Eye Distance:   Left Eye Distance:   Bilateral Distance:    Right Eye Near:   Left Eye Near:    Bilateral Near:     Physical Exam Vitals and nursing note reviewed.  Constitutional:      General: She is not in acute distress.    Appearance: She is well-developed. She is obese. She is not ill-appearing.  HENT:     Head: Normocephalic and atraumatic.     Mouth/Throat:     Mouth: Mucous membranes are moist.  Eyes:     Extraocular Movements: Extraocular movements intact.     Conjunctiva/sclera: Conjunctivae normal.     Pupils: Pupils are equal, round, and reactive to light.  Cardiovascular:     Rate and Rhythm: Normal rate  and regular rhythm.     Heart sounds: Normal heart sounds.  Pulmonary:     Effort: Pulmonary effort is normal. No respiratory distress.     Breath sounds: Normal breath sounds.  Abdominal:     General: Bowel sounds are normal.     Palpations: Abdomen is soft.     Tenderness: There is no abdominal tenderness. There is no guarding or rebound.  Musculoskeletal:     Cervical back: Neck supple.  Skin:    General: Skin is warm and dry.  Neurological:     General: No focal deficit present.     Mental Status: She is alert and oriented to person, place, and time.     Cranial Nerves: No cranial nerve deficit.     Sensory: No sensory deficit.     Motor: No weakness.     Gait: Gait normal.  Psychiatric:        Mood and Affect: Mood normal.        Behavior: Behavior normal.     UC Treatments / Results  Labs (all labs ordered are listed, but only abnormal results are displayed) Labs Reviewed - No data to display  EKG   Radiology No results found.  Procedures Procedures (including critical care time)  Medications Ordered in UC Medications  ketorolac (TORADOL) 30 MG/ML injection 30 mg (has no administration in time range)  ondansetron (ZOFRAN-ODT) disintegrating tablet 4 mg (has no administration in time range)  dexamethasone (DECADRON) injection 10 mg (has no administration in time range)    Initial Impression / Assessment and Plan / UC Course  I have reviewed the triage vital signs and the nursing notes.  Pertinent labs & imaging results that were available during my care of the patient were reviewed by me and considered in  my medical decision making (see chart for details).  Migraine headache, nausea, vomiting.  Dexamethasone and ketorolac given here.  First dose of Zofran given here.  Instructed patient to continue Zofran at home as directed for nausea/vomiting.  Instructed her to keep her self hydrated with clear liquids.  ED precautions discussed.  Instructed her to  follow-up with her PCP if her symptoms are not improving.  She agrees to plan of care.   Final Clinical Impressions(s) / UC Diagnoses   Final diagnoses:  Migraine without status migrainosus, not intractable, unspecified migraine type  Non-intractable vomiting with nausea, unspecified vomiting type     Discharge Instructions      You were given an injection of dexamethasone and ketorolac for your migraine headache today.  You were given Zofran for your nausea and vomiting.    Take the antinausea medication as directed.    Keep yourself hydrated with clear liquids, such as water, Gatorade, Pedialyte, Sprite, or ginger ale.    Go to the emergency department if you have acute worsening symptoms.    Follow up with your primary care provider if your symptoms are not improving.          ED Prescriptions     Medication Sig Dispense Auth. Provider   ondansetron (ZOFRAN ODT) 4 MG disintegrating tablet Take 1 tablet (4 mg total) by mouth every 8 (eight) hours as needed for nausea or vomiting. 20 tablet Mickie Bail, NP      PDMP not reviewed this encounter.   Mickie Bail, NP 03/05/21 [id number]

## 2021-03-06 ENCOUNTER — Other Ambulatory Visit (HOSPITAL_COMMUNITY): Payer: Self-pay

## 2021-03-08 ENCOUNTER — Other Ambulatory Visit (HOSPITAL_COMMUNITY): Payer: Self-pay

## 2021-03-11 ENCOUNTER — Other Ambulatory Visit: Payer: Self-pay | Admitting: Family Medicine

## 2021-03-11 ENCOUNTER — Other Ambulatory Visit: Payer: Self-pay

## 2021-03-11 DIAGNOSIS — G4701 Insomnia due to medical condition: Secondary | ICD-10-CM

## 2021-03-11 MED ORDER — TRAZODONE HCL 100 MG PO TABS
50.0000 mg | ORAL_TABLET | Freq: Every evening | ORAL | 0 refills | Status: DC | PRN
  Filled 2021-03-11: qty 90, 90d supply, fill #0

## 2021-03-11 MED ORDER — FUROSEMIDE 20 MG PO TABS
ORAL_TABLET | Freq: Every day | ORAL | 1 refills | Status: DC
  Filled 2021-03-11: qty 30, 30d supply, fill #0
  Filled 2021-04-26: qty 30, 30d supply, fill #1

## 2021-03-11 MED FILL — Adalimumab Auto-injector Kit 40 MG/0.8ML: SUBCUTANEOUS | 28 days supply | Qty: 2 | Fill #1 | Status: CN

## 2021-03-20 ENCOUNTER — Other Ambulatory Visit (HOSPITAL_COMMUNITY): Payer: Self-pay

## 2021-03-20 MED FILL — Adalimumab Auto-injector Kit 40 MG/0.8ML: SUBCUTANEOUS | 28 days supply | Qty: 2 | Fill #1 | Status: AC

## 2021-03-22 ENCOUNTER — Other Ambulatory Visit (HOSPITAL_BASED_OUTPATIENT_CLINIC_OR_DEPARTMENT_OTHER): Payer: Self-pay

## 2021-03-28 ENCOUNTER — Other Ambulatory Visit: Payer: Self-pay

## 2021-03-28 MED ORDER — PREDNISONE 5 MG PO TABS
ORAL_TABLET | ORAL | 0 refills | Status: DC
  Filled 2021-03-28: qty 70, 28d supply, fill #0

## 2021-03-29 ENCOUNTER — Other Ambulatory Visit: Payer: Self-pay

## 2021-03-29 ENCOUNTER — Other Ambulatory Visit (HOSPITAL_COMMUNITY): Payer: Self-pay

## 2021-03-29 MED FILL — Adalimumab Auto-injector Kit 40 MG/0.8ML: SUBCUTANEOUS | 28 days supply | Qty: 2 | Fill #2 | Status: AC

## 2021-04-01 ENCOUNTER — Other Ambulatory Visit (HOSPITAL_COMMUNITY): Payer: Self-pay

## 2021-04-02 ENCOUNTER — Other Ambulatory Visit: Payer: Self-pay | Admitting: Family Medicine

## 2021-04-02 ENCOUNTER — Other Ambulatory Visit: Payer: Self-pay

## 2021-04-02 DIAGNOSIS — F331 Major depressive disorder, recurrent, moderate: Secondary | ICD-10-CM

## 2021-04-02 MED FILL — Bupropion HCl Tab ER 24HR 300 MG: ORAL | 90 days supply | Qty: 90 | Fill #0 | Status: AC

## 2021-04-02 NOTE — Telephone Encounter (Signed)
Requested medication (s) are due for refill today - expired Rx  Requested medication (s) are on the active medication list -yes  Future visit scheduled -no  Last refill: 01/12/20  Notes to clinic: Lab up to date- no note to continue Rx- expired Rx- sent for review and RF.  Requested Prescriptions  Pending Prescriptions Disp Refills   atorvastatin (LIPITOR) 40 MG tablet 90 tablet 1    Sig: Take 1 tablet (40 mg total) by mouth at bedtime.      Cardiovascular:  Antilipid - Statins Failed - 04/02/2021 12:03 PM      Failed - Total Cholesterol in normal range and within 360 days    Cholesterol, Total  Date Value Ref Range Status  01/24/2021 212 (H) 100 - 199 mg/dL Final          Failed - LDL in normal range and within 360 days    LDL Cholesterol (Calc)  Date Value Ref Range Status  11/01/2018 162 (H) mg/dL (calc) Final    Comment:    Reference range: <100 . Desirable range <100 mg/dL for primary prevention;   <70 mg/dL for patients with CHD or diabetic patients  with > or = 2 CHD risk factors. Marland Kitchen LDL-C is now calculated using the Martin-Hopkins  calculation, which is a validated novel method providing  better accuracy than the Friedewald equation in the  estimation of LDL-C.  Horald Pollen et al. Lenox Ahr. 0762;263(33): 2061-2068  (http://education.[facility].com/faq/FAQ164)    LDL Chol Calc (NIH)  Date Value Ref Range Status  01/24/2021 135 (H) 0 - 99 mg/dL Final   Direct LDL  Date Value Ref Range Status  02/13/2012 178.3 mg/dL Final    Comment:    Optimal:  <100 mg/dLNear or Above Optimal:  100-129 mg/dLBorderline High:  130-159 mg/dLHigh:  160-189 mg/dLVery High:  >190 mg/dL          Passed - HDL in normal range and within 360 days    HDL  Date Value Ref Range Status  01/24/2021 61 >39 mg/dL Final          Passed - Triglycerides in normal range and within 360 days    Triglycerides  Date Value Ref Range Status  01/24/2021 92 0 - 149 mg/dL Final           Passed - Patient is not pregnant      Passed - Valid encounter within last 12 months    Recent Outpatient Visits           1 month ago COVID-19   [employer], Avanti, MD   1 month ago COVID-19   [employer], Lauren A, NP   2 months ago Sore throat   [facility] [place], Megan P, DO   2 months ago Psoriatic arthritis [facility])   [facility] [place], Megan P, DO   3 months ago Encounter for medication review   [facility] [place], Cornelius Moras, RPH-CPP                 Refused Prescriptions Disp Refills   buPROPion (WELLBUTRIN XL) 300 MG 24 hr tablet 90 tablet 1    Sig: TAKE 1 TABLET (300 MG TOTAL) BY MOUTH DAILY.      Psychiatry: Antidepressants - bupropion Passed - 04/02/2021 12:03 PM      Passed - Completed PHQ-2 or PHQ-9 in the last 360 days      Passed - Last BP in  normal range    BP Readings from Last 1 Encounters:  03/05/21 119/87          Passed - Valid encounter within last 6 months    Recent Outpatient Visits           1 month ago COVID-19   [facility] [place], Avanti, MD   1 month ago COVID-19   [facility], Lauren A, NP   2 months ago Sore throat   [facility] [place], Megan P, DO   2 months ago Psoriatic arthritis [facility])   [facility] [place], Megan P, DO   3 months ago Encounter for medication review   [facility] [place], Cornelius Moras, RPH-CPP                    Requested Prescriptions  Pending Prescriptions Disp Refills   atorvastatin (LIPITOR) 40 MG tablet 90 tablet 1    Sig: Take 1 tablet (40 mg total) by mouth at bedtime.      Cardiovascular:  Antilipid - Statins Failed - 04/02/2021 12:03 PM      Failed - Total Cholesterol in normal range and within 360 days    Cholesterol, Total  Date Value Ref Range Status  01/24/2021 212 (H) 100 - 199 mg/dL Final           Failed - LDL in normal range and within 360 days    LDL Cholesterol (Calc)  Date Value Ref Range Status  11/01/2018 162 (H) mg/dL (calc) Final    Comment:    Reference range: <100 . Desirable range <100 mg/dL for primary prevention;   <70 mg/dL for patients with CHD or diabetic patients  with > or = 2 CHD risk factors. Marland Kitchen LDL-C is now calculated using the Martin-Hopkins  calculation, which is a validated novel method providing  better accuracy than the Friedewald equation in the  estimation of LDL-C.  Horald Pollen et al. Lenox Ahr. 0109;323(55): 2061-2068  (http://education.[facility].com/faq/FAQ164)    LDL Chol Calc (NIH)  Date Value Ref Range Status  01/24/2021 135 (H) 0 - 99 mg/dL Final   Direct LDL  Date Value Ref Range Status  02/13/2012 178.3 mg/dL Final    Comment:    Optimal:  <100 mg/dLNear or Above Optimal:  100-129 mg/dLBorderline High:  130-159 mg/dLHigh:  160-189 mg/dLVery High:  >190 mg/dL          Passed - HDL in normal range and within 360 days    HDL  Date Value Ref Range Status  01/24/2021 61 >39 mg/dL Final          Passed - Triglycerides in normal range and within 360 days    Triglycerides  Date Value Ref Range Status  01/24/2021 92 0 - 149 mg/dL Final          Passed - Patient is not pregnant      Passed - Valid encounter within last 12 months    Recent Outpatient Visits           1 month ago COVID-19   [employer], Avanti, MD   1 month ago COVID-19   [employer], Lauren A, NP   2 months ago Sore throat   [facility] [place], Megan P, DO   2 months ago Psoriatic arthritis [facility])   [facility] [place], Megan P, DO   3 months ago Encounter for medication review   [facility] [place], Cornelius Moras, RPH-CPP                 Refused Prescriptions Disp Refills   buPROPion (WELLBUTRIN XL) 300 MG 24 hr tablet 90 tablet 1    Sig: TAKE  1 TABLET (300 MG TOTAL) BY MOUTH DAILY.      Psychiatry: Antidepressants - bupropion Passed - 04/02/2021 12:03 PM      Passed - Completed PHQ-2 or PHQ-9 in the last 360 days      Passed - Last BP in normal range    BP Readings from Last 1 Encounters:  03/05/21 119/87          Passed - Valid encounter within last 6 months    Recent Outpatient Visits           1 month ago COVID-19   [facility], Avanti, MD   1 month ago COVID-19   [facility], Jake Church, NP   2 months ago Sore throat   [facility] [place], Megan P, DO   2 months ago Psoriatic arthritis [facility])   [facility] [place], Megan P, DO   3 months ago Encounter for medication review   [facility] [place], Cornelius Moras, RPH-CPP

## 2021-04-02 NOTE — Telephone Encounter (Signed)
  Notes to clinic:  Script requested has expired  Review for continued use and new script   Requested Prescriptions  Pending Prescriptions Disp Refills   atorvastatin (LIPITOR) 40 MG tablet 90 tablet 1    Sig: Take 1 tablet (40 mg total) by mouth at bedtime.      Cardiovascular:  Antilipid - Statins Failed - 04/02/2021  1:00 PM      Failed - Total Cholesterol in normal range and within 360 days    Cholesterol, Total  Date Value Ref Range Status  01/24/2021 212 (H) 100 - 199 mg/dL Final          Failed - LDL in normal range and within 360 days    LDL Cholesterol (Calc)  Date Value Ref Range Status  11/01/2018 162 (H) mg/dL (calc) Final    Comment:    Reference range: <100 . Desirable range <100 mg/dL for primary prevention;   <70 mg/dL for patients with CHD or diabetic patients  with > or = 2 CHD risk factors. Marland Kitchen LDL-C is now calculated using the Martin-Hopkins  calculation, which is a validated novel method providing  better accuracy than the Friedewald equation in the  estimation of LDL-C.  Horald Pollen et al. Lenox Ahr. 0350;093(81): 2061-2068  (http://education.[facility].com/faq/FAQ164)    LDL Chol Calc (NIH)  Date Value Ref Range Status  01/24/2021 135 (H) 0 - 99 mg/dL Final   Direct LDL  Date Value Ref Range Status  02/13/2012 178.3 mg/dL Final    Comment:    Optimal:  <100 mg/dLNear or Above Optimal:  100-129 mg/dLBorderline High:  130-159 mg/dLHigh:  160-189 mg/dLVery High:  >190 mg/dL          Passed - HDL in normal range and within 360 days    HDL  Date Value Ref Range Status  01/24/2021 61 >39 mg/dL Final          Passed - Triglycerides in normal range and within 360 days    Triglycerides  Date Value Ref Range Status  01/24/2021 92 0 - 149 mg/dL Final          Passed - Patient is not pregnant      Passed - Valid encounter within last 12 months    Recent Outpatient Visits           1 month ago COVID-19   [employer],  Avanti, MD   1 month ago COVID-19   [employer], Jake Church, NP   2 months ago Sore throat   [facility] [place], Megan P, DO   2 months ago Psoriatic arthritis [facility])   [facility] [place], Megan P, DO   3 months ago Encounter for medication review   [facility] [place], Cornelius Moras, RPH-CPP

## 2021-04-04 ENCOUNTER — Other Ambulatory Visit: Payer: Self-pay

## 2021-04-04 MED ORDER — ATORVASTATIN CALCIUM 40 MG PO TABS
40.0000 mg | ORAL_TABLET | Freq: Every day | ORAL | 0 refills | Status: DC
  Filled 2021-04-04: qty 30, 30d supply, fill #0

## 2021-04-04 NOTE — Telephone Encounter (Signed)
30 day supply sent for patient to make appointment. She is due for a physical.

## 2021-04-04 NOTE — Telephone Encounter (Signed)
Would pt need apt for this? 

## 2021-04-24 ENCOUNTER — Other Ambulatory Visit (HOSPITAL_COMMUNITY): Payer: Self-pay

## 2021-04-26 ENCOUNTER — Other Ambulatory Visit: Payer: Self-pay

## 2021-05-14 ENCOUNTER — Other Ambulatory Visit: Payer: Self-pay | Admitting: Family Medicine

## 2021-05-14 DIAGNOSIS — Z1231 Encounter for screening mammogram for malignant neoplasm of breast: Secondary | ICD-10-CM

## 2021-05-22 DIAGNOSIS — Z1231 Encounter for screening mammogram for malignant neoplasm of breast: Secondary | ICD-10-CM

## 2021-05-30 ENCOUNTER — Other Ambulatory Visit: Payer: Self-pay | Admitting: Family Medicine

## 2021-05-30 ENCOUNTER — Other Ambulatory Visit: Payer: Self-pay | Admitting: Nurse Practitioner

## 2021-05-30 ENCOUNTER — Other Ambulatory Visit: Payer: Self-pay

## 2021-05-30 NOTE — Telephone Encounter (Signed)
Requested medications are due for refill today yes  Requested medications are on the active medication list yes  Last refill 04/04/21  Last visit 5/5 with chol labs  Future visit scheduled no  Notes to clinic was asked at 5/5 visit to return asap for physical, has only been seen for COVID, did have cholesterol labs in May so does pass protocol but only given one month rx and no upcoming visit scheduled for physical. Please assess.

## 2021-05-30 NOTE — Telephone Encounter (Signed)
Requested medications are due for refill today yes  Requested medications are on the active medication list yes  Last refill Welbutrin 7/12, lasix 8/5  Last visit Do not see Welbutrin addressed in visit this year, Lasix refill was curtesy, only appt since then was for COVID  Future visit scheduled no  Notes to clinic Lasix already had curtesy fill, only seen for COVID since, Do not see Welbutrin addressed.

## 2021-05-31 NOTE — Telephone Encounter (Signed)
Needs an appointment. Will get him enough medicine to make it to appointment when it's booked.   

## 2021-05-31 NOTE — Telephone Encounter (Signed)
Needs an appointment. Will get them enough medicine to make it to appointment when it's booked.

## 2021-06-01 MED FILL — Furosemide Tab 20 MG: ORAL | 30 days supply | Qty: 30 | Fill #0 | Status: AC

## 2021-06-01 MED FILL — Bupropion HCl Tab ER 24HR 300 MG: ORAL | 30 days supply | Qty: 30 | Fill #0 | Status: CN

## 2021-06-01 MED FILL — Atorvastatin Calcium Tab 40 MG (Base Equivalent): ORAL | 30 days supply | Qty: 30 | Fill #0 | Status: AC

## 2021-06-03 ENCOUNTER — Other Ambulatory Visit: Payer: Self-pay

## 2021-06-04 NOTE — Telephone Encounter (Signed)
Lmom to get pt scheduled for physical.

## 2021-06-05 NOTE — Telephone Encounter (Signed)
Pt has scheduled appt for 9/22 at 10:40 am.  Can you send in enough medication to get her through to her appt?  buPROPion (WELLBUTRIN XL) 300 MG 24 hr tablet furosemide (LASIX) 20 MG tablet  [facility]

## 2021-06-11 ENCOUNTER — Ambulatory Visit (INDEPENDENT_AMBULATORY_CARE_PROVIDER_SITE_OTHER): Payer: No Typology Code available for payment source

## 2021-06-11 ENCOUNTER — Ambulatory Visit (INDEPENDENT_AMBULATORY_CARE_PROVIDER_SITE_OTHER): Payer: No Typology Code available for payment source | Admitting: Dermatology

## 2021-06-11 ENCOUNTER — Other Ambulatory Visit: Payer: Self-pay

## 2021-06-11 ENCOUNTER — Encounter: Payer: Self-pay | Admitting: Dermatology

## 2021-06-11 DIAGNOSIS — L409 Psoriasis, unspecified: Secondary | ICD-10-CM

## 2021-06-11 DIAGNOSIS — L400 Psoriasis vulgaris: Secondary | ICD-10-CM

## 2021-06-11 MED ORDER — WYNZORA 0.005-0.064 % EX CREA: 1.0000 "application " | TOPICAL_CREAM | Freq: Every day | CUTANEOUS | 1 refills | Status: DC

## 2021-06-11 MED ORDER — VTAMA 1 % EX CREA: 1.0000 "application " | TOPICAL_CREAM | Freq: Every day | CUTANEOUS | 1 refills | Status: DC

## 2021-06-11 NOTE — Progress Notes (Addendum)
Follow-Up Visit   Subjective  Emma Rogers is a 55 y.o. female who presents for the following: Psoriasis (Patient here today for psoriasis follow up at arms and legs. Patient currently using HC 2.5% cream and started loading dose of Taltz for psoriatic arthritis, prescribed by Dr. Allena Katz. Patient does have a few new spots at both breasts and right hand. ).  Patient was originally put on Humira by Dr. Allena Katz and was told she could d/c topicals. Patient ended up with Covid and had to come off of Humira and has since started [place].  Patient had strep which may have caused psoriasis flare.   The following portions of the chart were reviewed this encounter and updated as appropriate:   Tobacco  Allergies  Meds  Problems  Med Hx  Surg Hx  Fam Hx      Review of Systems:  No other skin or systemic complaints except as noted in HPI or Assessment and Plan.  Objective  Well appearing patient in no apparent distress; mood and affect are within normal limits.  A focused examination was performed including arms, legs. Relevant physical exam findings are noted in the Assessment and Plan.  arms, legs Scaly pink nummular plaques scattered at arms, chest, legs   Assessment & Plan  Psoriasis arms, legs  Chronic condition with duration or expected duration over one year. Condition is bothersome to patient. Currently flared.  Patient getting married in around 3 weeks.  Pt with worsening after Strep infection in May. Switched off of Humira due to poor control of skin. Still with joint pain. Has been on Talz since August.   Patient advised it is a little early to know if Altamease Oiler is going to work.  Consider Skyrizi if not improving on Taltz.  Patient feels that Humira was better for her joint pain.  Start Eldred Manges once daily to affected areas of psoriasis at body. Avoid applying to face, groin, and axilla. Use as directed. Risk of skin atrophy with long-term use reviewed. Samples x 3 Lot#  [id number]  Exp: 07/2022  Start Vtama once daily to affected areas of psoriasis.  Samplse x 2 Lot# [id number]  Exp: Oct 2023  Continue HC 2.5% twice daily to affected areas at face and body folds.  Topical steroids (such as triamcinolone, fluocinolone, fluocinonide, mometasone, clobetasol, halobetasol, betamethasone, hydrocortisone) can cause thinning and lightening of the skin if they are used for too long in the same area. Your physician has selected the right strength medicine for your problem and area affected on the body. Please use your medication only as directed by your physician to prevent side effects.   Patient does have a hx of situational depression with passing of family member but no other history. In good mood and spirits. Discussed slight risk of depression with Otezla. She will monitor for mood changes and stop immediately if she has any.  Start [place] daily. Samples x 2 provided. Will plan to d/c once Altamease Oiler starts working. Lot #[id number]  Exp: Feb 20, 2023  Start nbUVB phototherapy twice per week  Psoriasis is a chronic non-curable, but treatable genetic/hereditary disease that may have other systemic features affecting other organ systems such as joints (Psoriatic Arthritis). It is associated with an increased risk of inflammatory bowel disease, heart disease, non-alcoholic fatty liver disease, and depression.     Tapinarof (VTAMA) 1 % CREA - arms, legs Apply 1 application topically daily.  Calcipotriene-Betameth Diprop (WYNZORA) 0.005-0.064 % CREA - arms, legs Apply 1 application  topically daily. Avoid applying to face, groin, and axilla. Use as directed. Risk of skin atrophy with long-term use reviewed.  Return in about 1 month (around 07/11/2021) for Psoriasis.  Anise Salvo, RMA, am acting as scribe for Darden Dates, MD .  Documentation: I have reviewed the above documentation for accuracy and completeness, and I agree with the above.  Darden Dates, MD

## 2021-06-11 NOTE — Progress Notes (Signed)
Patient treated in the lightbox/NBUVB for 1 minutes and 28 seconds.   

## 2021-06-11 NOTE — Patient Instructions (Addendum)
Psoriasis is a chronic non-curable, but treatable genetic/hereditary disease that may have other systemic features affecting other organ systems such as joints (Psoriatic Arthritis). It is associated with an increased risk of inflammatory bowel disease, heart disease, non-alcoholic fatty liver disease, and depression.    Start Eldred Manges once daily to affected areas of psoriasis at body. Avoid applying to face, groin, and axilla. Use as directed. Risk of skin atrophy with long-term use reviewed.   Start Vtama once daily to affected areas of psoriasis.   Continue HC 2.5% twice daily to affected areas at face and body folds.  Topical steroids (such as triamcinolone, fluocinolone, fluocinonide, mometasone, clobetasol, halobetasol, betamethasone, hydrocortisone) can cause thinning and lightening of the skin if they are used for too long in the same area. Your physician has selected the right strength medicine for your problem and area affected on the body. Please use your medication only as directed by your physician to prevent side effects.   Side effects of Otezla (apremilast) include diarrhea, nausea, headache, upper respiratory infection, depression, and weight decrease (5-10%). It should only be taken by pregnant women after a discussion regarding risks and benefits with their doctor. Goal is control of skin condition, not cure.  The use of [place] requires long term medication management, including periodic office visits.  If you have any questions or concerns for your doctor, please call our main line at [phone] and press option 4 to reach your doctor's medical assistant. If no one answers, please leave a voicemail as directed and we will return your call as soon as possible. Messages left after 4 pm will be answered the following business day.   You may also send [place] a message via MyChart. We typically respond to MyChart messages within 1-2 business days.  For prescription refills, please ask  your pharmacy to contact our office. Our fax number is [phone].  If you have an urgent issue when the clinic is closed that cannot wait until the next business day, you can page your doctor at the number below.    Please note that while we do our best to be available for urgent issues outside of office hours, we are not available 24/7.   If you have an urgent issue and are unable to reach [place], you may choose to seek medical care at your doctor's office, retail clinic, urgent care center, or emergency room.  If you have a medical emergency, please immediately call 911 or go to the emergency department.  Pager Numbers  - Dr. Gwen Pounds: [phone]  - Dr. Neale Burly: [phone]  - Dr. Roseanne Reno: [phone]  In the event of inclement weather, please call our main line at [phone] for an update on the status of any delays or closures.  Dermatology Medication Tips: Please keep the boxes that topical medications come in in order to help keep track of the instructions about where and how to use these. Pharmacies typically print the medication instructions only on the boxes and not directly on the medication tubes.   If your medication is too expensive, please contact our office at [phone] option 4 or send [place] a message through MyChart.   We are unable to tell what your co-pay for medications will be in advance as this is different depending on your insurance coverage. However, we may be able to find a substitute medication at lower cost or fill out paperwork to get insurance to cover a needed medication.   If a prior authorization is required to get your  medication covered by your insurance company, please allow [place] 1-2 business days to complete this process.  Drug prices often vary depending on where the prescription is filled and some pharmacies may offer cheaper prices.  The website www.goodrx.com contains coupons for medications through different pharmacies. The prices here do not  account for what the cost may be with help from insurance (it may be cheaper with your insurance), but the website can give you the price if you did not use any insurance.  - You can print the associated coupon and take it with your prescription to the pharmacy.  - You may also stop by our office during regular business hours and pick up a GoodRx coupon card.  - If you need your prescription sent electronically to a different pharmacy, notify our office through [facility] or by phone at [phone] option 4.

## 2021-06-13 ENCOUNTER — Ambulatory Visit: Payer: No Typology Code available for payment source | Admitting: Family Medicine

## 2021-06-14 ENCOUNTER — Other Ambulatory Visit (HOSPITAL_COMMUNITY): Payer: Self-pay | Admitting: Family Medicine

## 2021-06-14 ENCOUNTER — Inpatient Hospital Stay (HOSPITAL_COMMUNITY): Admission: RE | Admit: 2021-06-14 | Payer: No Typology Code available for payment source | Source: Ambulatory Visit

## 2021-06-14 ENCOUNTER — Encounter (HOSPITAL_COMMUNITY): Payer: Self-pay

## 2021-06-14 DIAGNOSIS — Z1231 Encounter for screening mammogram for malignant neoplasm of breast: Secondary | ICD-10-CM

## 2021-06-17 ENCOUNTER — Ambulatory Visit: Payer: No Typology Code available for payment source

## 2021-06-20 ENCOUNTER — Ambulatory Visit: Payer: No Typology Code available for payment source

## 2021-06-24 ENCOUNTER — Other Ambulatory Visit: Payer: Self-pay

## 2021-06-24 ENCOUNTER — Encounter: Payer: Self-pay | Admitting: Family Medicine

## 2021-06-24 ENCOUNTER — Ambulatory Visit (INDEPENDENT_AMBULATORY_CARE_PROVIDER_SITE_OTHER): Payer: No Typology Code available for payment source | Admitting: Family Medicine

## 2021-06-24 VITALS — BP 96/70 | HR 73 | Temp 98.5°F | Resp 16 | Ht 65.0 in | Wt 205.0 lb

## 2021-06-24 DIAGNOSIS — J4541 Moderate persistent asthma with (acute) exacerbation: Secondary | ICD-10-CM

## 2021-06-24 DIAGNOSIS — E7849 Other hyperlipidemia: Secondary | ICD-10-CM

## 2021-06-24 DIAGNOSIS — F331 Major depressive disorder, recurrent, moderate: Secondary | ICD-10-CM | POA: Diagnosis not present

## 2021-06-24 DIAGNOSIS — F419 Anxiety disorder, unspecified: Secondary | ICD-10-CM | POA: Diagnosis not present

## 2021-06-24 DIAGNOSIS — H6122 Impacted cerumen, left ear: Secondary | ICD-10-CM

## 2021-06-24 DIAGNOSIS — G43009 Migraine without aura, not intractable, without status migrainosus: Secondary | ICD-10-CM | POA: Diagnosis not present

## 2021-06-24 DIAGNOSIS — G4701 Insomnia due to medical condition: Secondary | ICD-10-CM

## 2021-06-24 DIAGNOSIS — L4050 Arthropathic psoriasis, unspecified: Secondary | ICD-10-CM

## 2021-06-24 DIAGNOSIS — R7303 Prediabetes: Secondary | ICD-10-CM

## 2021-06-24 LAB — BAYER DCA HB A1C WAIVED: HB A1C (BAYER DCA - WAIVED): 5.7 % — ABNORMAL HIGH (ref 4.8–5.6)

## 2021-06-24 MED ORDER — ATORVASTATIN CALCIUM 40 MG PO TABS
40.0000 mg | ORAL_TABLET | Freq: Every day | ORAL | 1 refills | Status: DC
  Filled 2021-06-24 – 2021-10-21 (×2): qty 90, 90d supply, fill #0

## 2021-06-24 MED ORDER — BUPROPION HCL ER (XL) 300 MG PO TB24
300.0000 mg | ORAL_TABLET | Freq: Every day | ORAL | 1 refills | Status: DC
  Filled 2021-06-24 – 2021-07-09 (×2): qty 90, 90d supply, fill #0
  Filled 2021-10-21: qty 90, 90d supply, fill #1

## 2021-06-24 MED ORDER — FUROSEMIDE 20 MG PO TABS
20.0000 mg | ORAL_TABLET | Freq: Every day | ORAL | 1 refills | Status: DC
  Filled 2021-06-24 – 2021-07-09 (×2): qty 90, 90d supply, fill #0
  Filled 2021-10-21: qty 90, 90d supply, fill #1

## 2021-06-24 MED ORDER — TRAZODONE HCL 100 MG PO TABS
50.0000 mg | ORAL_TABLET | Freq: Every evening | ORAL | 1 refills | Status: DC | PRN
Start: 2021-06-24 — End: 2021-10-28
  Filled 2021-06-24: qty 90, 90d supply, fill #0
  Filled 2021-09-02: qty 90, 90d supply, fill #1

## 2021-06-24 MED ORDER — RIZATRIPTAN BENZOATE 10 MG PO TABS
10.0000 mg | ORAL_TABLET | ORAL | 12 refills | Status: DC | PRN
  Filled 2021-06-24: qty 10, 30d supply, fill #0
  Filled 2021-08-23: qty 10, 30d supply, fill #1
  Filled 2022-02-18: qty 10, 30d supply, fill #2
  Filled 2022-04-24: qty 10, 30d supply, fill #3

## 2021-06-24 NOTE — Assessment & Plan Note (Signed)
Under good control on current regimen. Continue current regimen. Continue to monitor. Call with any concerns. Refills given.   

## 2021-06-24 NOTE — Assessment & Plan Note (Signed)
Following with dermatology. Continue to monitor. Call with any concerns.

## 2021-06-24 NOTE — Progress Notes (Signed)
BP 96/70 (BP Location: Right Arm, Patient Position: Sitting, Cuff Size: Large)   Pulse 73   Temp 98.5 F (36.9 C) (Oral)   Resp 16   Ht 5\' 5"  (1.651 m)   Wt 205 lb (93 kg)   SpO2 99%   BMI 34.11 kg/m    Subjective:    Patient ID: , female    DOB: 23-May-1966, 55 y.o.   MRN: [id number]  HPI: Emma Rogers is a 55 y.o. female  Chief Complaint  Patient presents with   Hyperlipidemia   HYPERLIPIDEMIA Hyperlipidemia status: excellent compliance Satisfied with current treatment?  yes Side effects:  no Medication compliance: excellent compliance Past cholesterol meds: atorvastatin Supplements: none Aspirin:  no The 10-year ASCVD risk score (Arnett DK, et al., 2019) is: 1%   Values used to calculate the score:     Age: 32 years     Sex: Female     Is Non-Hispanic African American: No     Diabetic: No     Tobacco smoker: No     Systolic Blood Pressure: 96 mmHg     Is BP treated: No     HDL Cholesterol: 61 mg/dL     Total Cholesterol: 212 mg/dL Chest pain:  no Coronary artery disease:  no  DEPRESSION Mood status: controlled Satisfied with current treatment?: yes Symptom severity: mild  Duration of current treatment : chronic Side effects: no Medication compliance: excellent compliance Psychotherapy/counseling: no  Previous psychiatric medications: sertraline (not on) and wellbutrin Depressed mood: no Anxious mood: yes Anhedonia: no Significant weight loss or gain: no Insomnia: yes hard to fall asleep Fatigue: yes Feelings of worthlessness or guilt: yes Impaired concentration/indecisiveness: no Suicidal ideations: no Hopelessness: no Crying spells: no Depression screen [facility] 2/9 06/24/2021 02/25/2021 01/24/2021 10/23/2020 07/26/2020  Decreased Interest 0 0 0 0 0  Down, Depressed, Hopeless 0 0 0 0 0  PHQ - 2 Score 0 0 0 0 0  Altered sleeping 3 0 2 0 -  Tired, decreased energy 1 0 0 1 -  Change in appetite 0 0 0 0 -  Feeling bad or failure about  yourself  0 0 0 0 -  Trouble concentrating 0 0 0 0 -  Moving slowly or fidgety/restless 0 0 0 0 -  Suicidal thoughts 0 0 0 0 -  PHQ-9 Score 4 0 2 1 -  Difficult doing work/chores Somewhat difficult - - Not difficult at all -  Some encounter information is confidential and restricted. Go to Review Flowsheets activity to see all data.  Some recent data might be hidden   GAD 7 : Generalized Anxiety Score 06/24/2021 04/04/2019 01/27/2019 10/18/2018  Nervous, Anxious, on Edge 1 1 2 3   Control/stop worrying 1 1 2 3   Worry too much - different things 1 2 2 3   Trouble relaxing 0 0 2 2  Restless 0 0 1 0  Easily annoyed or irritable 1 1 1  0  Afraid - awful might happen 0 0 0 2  Total GAD 7 Score 4 5 10 13   Anxiety Difficulty Not difficult at all Not difficult at all Somewhat difficult Very difficult  Some encounter information is confidential and restricted. Go to Review Flowsheets activity to see all data.    Relevant past medical, surgical, family and social history reviewed and updated as indicated. Interim medical history since our last visit reviewed. Allergies and medications reviewed and updated.  Review of Systems  Constitutional: Negative.   Respiratory: Negative.  Cardiovascular: Negative.   Gastrointestinal: Negative.   Musculoskeletal: Negative.   Skin:  Positive for rash. Negative for color change, pallor and wound.  Psychiatric/Behavioral: Negative.     Per HPI unless specifically indicated above     Objective:    BP 96/70 (BP Location: Right Arm, Patient Position: Sitting, Cuff Size: Large)   Pulse 73   Temp 98.5 F (36.9 C) (Oral)   Resp 16   Ht 5\' 5"  (1.651 m)   Wt 205 lb (93 kg)   SpO2 99%   BMI 34.11 kg/m   Wt Readings from Last 3 Encounters:  06/24/21 205 lb (93 kg)  02/14/21 205 lb (93 kg)  01/08/21 205 lb (93 kg)    Physical Exam Vitals and nursing note reviewed.  Constitutional:      General: She is not in acute distress.    Appearance: Normal  appearance. She is not ill-appearing, toxic-appearing or diaphoretic.  HENT:     Head: Normocephalic and atraumatic.     Right Ear: Tympanic membrane, ear canal and external ear normal.     Left Ear: External ear normal. There is impacted cerumen.     Nose: Nose normal.     Mouth/Throat:     Mouth: Mucous membranes are moist.     Pharynx: Oropharynx is clear.  Eyes:     General: No scleral icterus.       Right eye: No discharge.        Left eye: No discharge.     Extraocular Movements: Extraocular movements intact.     Conjunctiva/sclera: Conjunctivae normal.     Pupils: Pupils are equal, round, and reactive to light.  Cardiovascular:     Rate and Rhythm: Normal rate and regular rhythm.     Pulses: Normal pulses.     Heart sounds: Normal heart sounds. No murmur heard.   No friction rub. No gallop.  Pulmonary:     Effort: Pulmonary effort is normal. No respiratory distress.     Breath sounds: Normal breath sounds. No stridor. No wheezing, rhonchi or rales.  Chest:     Chest wall: No tenderness.  Musculoskeletal:        General: Normal range of motion.     Cervical back: Normal range of motion and neck supple.  Skin:    General: Skin is warm and dry.     Capillary Refill: Capillary refill takes less than 2 seconds.     Coloration: Skin is not jaundiced or pale.     Findings: No bruising, erythema, lesion or rash.  Neurological:     General: No focal deficit present.     Mental Status: She is alert and oriented to person, place, and time. Mental status is at baseline.  Psychiatric:        Mood and Affect: Mood normal.        Behavior: Behavior normal.        Thought Content: Thought content normal.        Judgment: Judgment normal.    Results for orders placed or performed in visit on 02/01/21  Rapid Strep Screen ([facility] ONLY)   Specimen: Other   Other  Result Value Ref Range   Strep Gp A Ag, IA W/Reflex Negative Negative  Culture, Group A Strep   Other   Result Value Ref Range   Strep A Culture Positive (A)   Veritor Flu A/B Waived  Result Value Ref Range   Influenza A Negative Negative  Influenza B Negative Negative      Assessment & Plan:   Problem List Items Addressed This Visit       Cardiovascular and Mediastinum   Migraine without aura and without status migrainosus, not intractable    Under good control on current regimen. Continue current regimen. Continue to monitor. Call with any concerns. Refills given.        Relevant Medications   atorvastatin (LIPITOR) 40 MG tablet   buPROPion (WELLBUTRIN XL) 300 MG 24 hr tablet   furosemide (LASIX) 20 MG tablet   rizatriptan (MAXALT) 10 MG tablet   traZODone (DESYREL) 100 MG tablet     Respiratory   Moderate persistent asthma    Under good control on current regimen. Continue current regimen. Continue to monitor. Call with any concerns. Refills given.          Musculoskeletal and Integument   Psoriatic arthritis [facility])    Following with dermatology. Continue to monitor. Call with any concerns.         Other   Prediabetes    Rechecking labs today. Await results. Treat as needed.       Relevant Orders   Bayer DCA Hb A1c Waived   Insomnia    Under good control on current regimen. Continue current regimen. Continue to monitor. Call with any concerns. Refills given.       Relevant Medications   traZODone (DESYREL) 100 MG tablet   Familial hyperlipidemia - Primary    Under good control on current regimen. Continue current regimen. Continue to monitor. Call with any concerns. Refills given. Labs drawn today.       Relevant Medications   atorvastatin (LIPITOR) 40 MG tablet   furosemide (LASIX) 20 MG tablet   Other Relevant Orders   Comprehensive metabolic panel   Lipid Panel w/o Chol/HDL Ratio   Anxiety    Under good control on current regimen. Continue current regimen. Continue to monitor. Call with any concerns. Refills given.       Relevant Medications    buPROPion (WELLBUTRIN XL) 300 MG 24 hr tablet   traZODone (DESYREL) 100 MG tablet   MDD (major depressive disorder), recurrent episode, moderate (HCC)    Under good control on current regimen. Continue current regimen. Continue to monitor. Call with any concerns. Refills given.       Relevant Medications   buPROPion (WELLBUTRIN XL) 300 MG 24 hr tablet   traZODone (DESYREL) 100 MG tablet   Other Visit Diagnoses     Hearing loss of left ear due to cerumen impaction       Ear flushed today with good results.         Follow up plan: Return in about 6 months (around 12/23/2021), or physical.

## 2021-06-24 NOTE — Assessment & Plan Note (Signed)
Rechecking labs today. Await results. Treat as needed.  °

## 2021-06-24 NOTE — Assessment & Plan Note (Signed)
Under good control on current regimen. Continue current regimen. Continue to monitor. Call with any concerns. Refills given. Labs drawn today.   

## 2021-06-25 LAB — COMPREHENSIVE METABOLIC PANEL
ALT: 20 IU/L (ref 0–32)
AST: 18 IU/L (ref 0–40)
Albumin/Globulin Ratio: 1.8 (ref 1.2–2.2)
Albumin: 4.4 g/dL (ref 3.8–4.9)
Alkaline Phosphatase: 63 IU/L (ref 44–121)
BUN/Creatinine Ratio: 23 (ref 9–23)
BUN: 25 mg/dL — ABNORMAL HIGH (ref 6–24)
Bilirubin Total: 0.3 mg/dL (ref 0.0–1.2)
CO2: 25 mmol/L (ref 20–29)
Calcium: 9.4 mg/dL (ref 8.7–10.2)
Chloride: 100 mmol/L (ref 96–106)
Creatinine, Ser: 1.09 mg/dL — ABNORMAL HIGH (ref 0.57–1.00)
Globulin, Total: 2.5 g/dL (ref 1.5–4.5)
Glucose: 111 mg/dL — ABNORMAL HIGH (ref 70–99)
Potassium: 4.1 mmol/L (ref 3.5–5.2)
Sodium: 139 mmol/L (ref 134–144)
Total Protein: 6.9 g/dL (ref 6.0–8.5)
eGFR: 60 mL/min/{1.73_m2} (ref >59)

## 2021-06-25 LAB — LIPID PANEL W/O CHOL/HDL RATIO
Cholesterol, Total: 221 mg/dL — ABNORMAL HIGH (ref 100–199)
HDL: 54 mg/dL (ref >39)
LDL Chol Calc (NIH): 134 mg/dL — ABNORMAL HIGH (ref 0–99)
Triglycerides: 185 mg/dL — ABNORMAL HIGH (ref 0–149)
VLDL Cholesterol Cal: 33 mg/dL (ref 5–40)

## 2021-07-01 ENCOUNTER — Telehealth: Payer: Self-pay | Admitting: Family Medicine
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
## 2021-07-01 NOTE — Telephone Encounter (Signed)
Copied from CRM [id number]. Topic: General - Other >> Jul 01, 2021 12:10 PM Jaquita Rector A wrote: Reason for CRM: Patient called in to inform Dr Laural Benes that she have a UTI and would like an Rx called in to her pharmacy states that she is getting married this Friday 07/05/21 and is willing to come in and be seen if she must. Asking for a call back with an answer at Ph# [phone]

## 2021-07-01 NOTE — Telephone Encounter (Signed)
Pt is scheduled tomorrow with Leotis Shames

## 2021-07-02 ENCOUNTER — Other Ambulatory Visit: Payer: Self-pay

## 2021-07-02 ENCOUNTER — Encounter: Payer: Self-pay | Admitting: Nurse Practitioner

## 2021-07-02 ENCOUNTER — Ambulatory Visit (INDEPENDENT_AMBULATORY_CARE_PROVIDER_SITE_OTHER): Payer: No Typology Code available for payment source | Admitting: Nurse Practitioner

## 2021-07-02 VITALS — BP 110/80 | HR 65 | Temp 98.5°F | Ht 65.0 in | Wt 213.0 lb

## 2021-07-02 DIAGNOSIS — R300 Dysuria: Secondary | ICD-10-CM

## 2021-07-02 DIAGNOSIS — N898 Other specified noninflammatory disorders of vagina: Secondary | ICD-10-CM | POA: Diagnosis not present

## 2021-07-02 LAB — URINALYSIS, ROUTINE W REFLEX MICROSCOPIC
Bilirubin, UA: NEGATIVE
Glucose, UA: NEGATIVE
Ketones, UA: NEGATIVE
Leukocytes,UA: NEGATIVE
Nitrite, UA: NEGATIVE
Protein,UA: NEGATIVE
RBC, UA: NEGATIVE
Specific Gravity, UA: 1.015 (ref 1.005–1.030)
Urobilinogen, Ur: 0.2 mg/dL (ref 0.2–1.0)
pH, UA: 7 (ref 5.0–7.5)

## 2021-07-02 LAB — WET PREP FOR TRICH, YEAST, CLUE
Clue Cell Exam: POSITIVE — AB
Trichomonas Exam: NEGATIVE
Yeast Exam: NEGATIVE

## 2021-07-02 MED ORDER — CLINDAMYCIN HCL 300 MG PO CAPS
300.0000 mg | ORAL_CAPSULE | Freq: Two times a day (BID) | ORAL | 0 refills | Status: DC
  Filled 2021-07-02: qty 14, 7d supply, fill #0

## 2021-07-04 ENCOUNTER — Ambulatory Visit: Payer: Self-pay

## 2021-07-04 NOTE — Telephone Encounter (Signed)
Patient called in to say that she is being treated for a UTI with clindamycin (CLEOCIN) 300 MG capsule but say that on 07/03/21 she started feeling some pressure in her abdomen after urinating and feel like she still have the urge to go. ( FYI: patient stated that on yesterday did not drink much water but was drinking Sprite Zero) Please call Ph#  [phone]   Pt. Reports she started having pressure after voiding, urinary frequency yesterday. Still on antibiotic. States she is getting married tomorrow and is concerned about new symptoms. Has increased her water intake. Symptoms are worse today. Please advise.   Answer Assessment - Initial Assessment Questions 1. SYMPTOM: "What's the main symptom you're concerned about?" (e.g., frequency, incontinence)     Frequency, urgency 2. ONSET: "When did the  symptoms start?"     Yesterday 3. PAIN: "Is there any pain?" If Yes, ask: "How bad is it?" (Scale: 1-10; mild, moderate, severe)     Mild - pressure  5 4. CAUSE: "What do you think is causing the symptoms?"     Unsure 5. OTHER SYMPTOMS: "Do you have any other symptoms?" (e.g., fever, flank pain, blood in urine, pain with urination)     No 6. PREGNANCY: "Is there any chance you are pregnant?" "When was your last menstrual period?"     No  Protocols used: Urinary Symptoms-A-AH

## 2021-07-09 ENCOUNTER — Other Ambulatory Visit: Payer: Self-pay

## 2021-07-15 ENCOUNTER — Other Ambulatory Visit: Payer: Self-pay

## 2021-07-15 ENCOUNTER — Encounter: Payer: Self-pay | Admitting: Family Medicine

## 2021-07-15 ENCOUNTER — Ambulatory Visit (INDEPENDENT_AMBULATORY_CARE_PROVIDER_SITE_OTHER): Payer: No Typology Code available for payment source | Admitting: Family Medicine

## 2021-07-15 DIAGNOSIS — Z23 Encounter for immunization: Secondary | ICD-10-CM

## 2021-07-15 DIAGNOSIS — E66811 Obesity, class 1: Secondary | ICD-10-CM | POA: Insufficient documentation

## 2021-07-15 DIAGNOSIS — E6609 Other obesity due to excess calories: Secondary | ICD-10-CM | POA: Insufficient documentation

## 2021-07-15 MED ORDER — INSULIN PEN NEEDLE 31G X 5 MM MISC
1.0000 | Freq: Every day | 12 refills | Status: DC
  Filled 2021-07-15: qty 100, fill #0
  Filled 2021-07-19: qty 100, 90d supply, fill #0
  Filled 2021-12-23: qty 100, 90d supply, fill #1

## 2021-07-15 MED ORDER — SAXENDA 18 MG/3ML ~~LOC~~ SOPN
PEN_INJECTOR | SUBCUTANEOUS | 1 refills | Status: DC
  Filled 2021-07-15: qty 45, 90d supply, fill #0
  Filled 2021-07-19: qty 15, 30d supply, fill #0

## 2021-07-15 NOTE — Progress Notes (Signed)
BP 123/81   Pulse 68   Temp 98.1 F (36.7 C) (Oral)   Ht 5\' 5"  (1.651 m)   Wt 212 lb 9.6 oz (96.4 kg)   SpO2 96%   BMI 35.38 kg/m    Subjective:    Patient ID: , female    DOB: 05-Mar-1966, 55 y.o.   MRN: [id number]  HPI: Emma Rogers is a 55 y.o. female  Chief Complaint  Patient presents with   Weight Loss    Pt states she wants to discuss loosing weight. States she has tried the keto and 57.     OBESITY Duration: chronic- has been going to [employer], has been doing walking on the treadmill, Has been going to the gym for about an hour 3x a week, she is walking about a mile and half and doing the circuit. She has done more whole grains rather than white breads. Intermittent fasting, keto and mediterranean diets, doing more of an inflammation diet right now. Has been cutting down on portions.  Previous attempts at weight loss: yes Complications of obesity: low back pain, IFG, HLD Peak weight: 260 Weight loss goal: 155 Weight loss to date: 48 lbs!  Requesting obesity pharmacotherapy: yes Current weight loss supplements/medications: no Previous weight loss supplements/meds: yes- wellbutrin    Relevant past medical, surgical, family and social history reviewed and updated as indicated. Interim medical history since our last visit reviewed. Allergies and medications reviewed and updated.  Review of Systems  Constitutional: Negative.   Respiratory: Negative.    Cardiovascular: Negative.   Gastrointestinal: Negative.   Musculoskeletal: Negative.   Neurological: Negative.   Psychiatric/Behavioral: Negative.     Per HPI unless specifically indicated above     Objective:    BP 123/81   Pulse 68   Temp 98.1 F (36.7 C) (Oral)   Ht 5\' 5"  (1.651 m)   Wt 212 lb 9.6 oz (96.4 kg)   SpO2 96%   BMI 35.38 kg/m   Wt Readings from Last 3 Encounters:  07/15/21 212 lb 9.6 oz (96.4 kg)  07/02/21 213 lb (96.6 kg)  06/24/21 205 lb (93 kg)     Physical Exam Vitals and nursing note reviewed.  Constitutional:      General: She is not in acute distress.    Appearance: Normal appearance. She is not ill-appearing, toxic-appearing or diaphoretic.  HENT:     Head: Normocephalic and atraumatic.     Right Ear: External ear normal.     Left Ear: External ear normal.     Nose: Nose normal.     Mouth/Throat:     Mouth: Mucous membranes are moist.     Pharynx: Oropharynx is clear.  Eyes:     General: No scleral icterus.       Right eye: No discharge.        Left eye: No discharge.     Extraocular Movements: Extraocular movements intact.     Conjunctiva/sclera: Conjunctivae normal.     Pupils: Pupils are equal, round, and reactive to light.  Cardiovascular:     Rate and Rhythm: Normal rate and regular rhythm.     Pulses: Normal pulses.     Heart sounds: Normal heart sounds. No murmur heard.   No friction rub. No gallop.  Pulmonary:     Effort: Pulmonary effort is normal. No respiratory distress.     Breath sounds: Normal breath sounds. No stridor. No wheezing, rhonchi or rales.  Chest:  Chest wall: No tenderness.  Musculoskeletal:        General: Normal range of motion.     Cervical back: Normal range of motion and neck supple.  Skin:    General: Skin is warm and dry.     Capillary Refill: Capillary refill takes less than 2 seconds.     Coloration: Skin is not jaundiced or pale.     Findings: No bruising, erythema, lesion or rash.  Neurological:     General: No focal deficit present.     Mental Status: She is alert and oriented to person, place, and time. Mental status is at baseline.  Psychiatric:        Mood and Affect: Mood normal.        Behavior: Behavior normal.        Thought Content: Thought content normal.        Judgment: Judgment normal.    Results for orders placed or performed in visit on 07/02/21  WET PREP FOR TRICH, YEAST, CLUE   Specimen: Sterile Swab   Sterile Swab  Result Value Ref Range    Trichomonas Exam Negative Negative   Yeast Exam Negative Negative   Clue Cell Exam Positive (A) Negative  Urinalysis, Routine w reflex microscopic  Result Value Ref Range   Specific Gravity, UA 1.015 1.005 - 1.030   pH, UA 7.0 5.0 - 7.5   Color, UA Yellow Yellow   Appearance Ur Clear Clear   Leukocytes,UA Negative Negative   Protein,UA Negative Negative/Trace   Glucose, UA Negative Negative   Ketones, UA Negative Negative   RBC, UA Negative Negative   Bilirubin, UA Negative Negative   Urobilinogen, Ur 0.2 0.2 - 1.0 mg/dL   Nitrite, UA Negative Negative      Assessment & Plan:   Problem List Items Addressed This Visit       Other   Morbid obesity (HCC) - Primary    Will continue wellbutrin and start saxenda. Recheck 6-8 weeks. Call with any concerns. Continue to monitor. Goal of losing 1-2lbs per week.       Relevant Medications   Liraglutide -Weight Management (SAXENDA) 18 MG/3ML SOPN   Other Visit Diagnoses     Need for influenza vaccination       Relevant Orders   Flu Vaccine QUAD 40mo+IM (Fluarix, Fluzone & Alfiuria Quad PF) (Completed)        Follow up plan: Return 6-8 weeks.

## 2021-07-15 NOTE — Assessment & Plan Note (Signed)
Will continue wellbutrin and start saxenda. Recheck 6-8 weeks. Call with any concerns. Continue to monitor. Goal of losing 1-2lbs per week.

## 2021-07-15 NOTE — Telephone Encounter (Signed)
Patient has appointment today, 07/15/2021.

## 2021-07-16 ENCOUNTER — Encounter: Payer: Self-pay | Admitting: Dermatology

## 2021-07-16 ENCOUNTER — Other Ambulatory Visit: Payer: Self-pay

## 2021-07-16 ENCOUNTER — Ambulatory Visit (INDEPENDENT_AMBULATORY_CARE_PROVIDER_SITE_OTHER): Payer: No Typology Code available for payment source | Admitting: Dermatology

## 2021-07-16 ENCOUNTER — Ambulatory Visit (INDEPENDENT_AMBULATORY_CARE_PROVIDER_SITE_OTHER): Payer: No Typology Code available for payment source

## 2021-07-16 DIAGNOSIS — L400 Psoriasis vulgaris: Secondary | ICD-10-CM

## 2021-07-16 DIAGNOSIS — L409 Psoriasis, unspecified: Secondary | ICD-10-CM | POA: Diagnosis not present

## 2021-07-16 NOTE — Patient Instructions (Signed)
Psoriasis is a chronic non-curable, but treatable genetic/hereditary disease that may have other systemic features affecting other organ systems such as joints (Psoriatic Arthritis). It is associated with an increased risk of inflammatory bowel disease, heart disease, non-alcoholic fatty liver disease, and depression.    Consider switching back to Humira or starting [employer].   Recommend NVUVB twice weekly.  Continue Taltz Continue Wynzora once daily Continue Vtama once daily Recommend Vaseline daily  If you have any questions or concerns for your doctor, please call our main line at [phone] and press option 4 to reach your doctor's medical assistant. If no one answers, please leave a voicemail as directed and we will return your call as soon as possible. Messages left after 4 pm will be answered the following business day.   You may also send [place] a message via MyChart. We typically respond to MyChart messages within 1-2 business days.  For prescription refills, please ask your pharmacy to contact our office. Our fax number is [phone].  If you have an urgent issue when the clinic is closed that cannot wait until the next business day, you can page your doctor at the number below.    Please note that while we do our best to be available for urgent issues outside of office hours, we are not available 24/7.   If you have an urgent issue and are unable to reach [place], you may choose to seek medical care at your doctor's office, retail clinic, urgent care center, or emergency room.  If you have a medical emergency, please immediately call 911 or go to the emergency department.  Pager Numbers  - Dr. Gwen Pounds: [phone]  - Dr. Neale Burly: [phone]  - Dr. Roseanne Reno: [phone]  In the event of inclement weather, please call our main line at [phone] for an update on the status of any delays or closures.  Dermatology Medication Tips: Please keep the boxes that topical medications  come in in order to help keep track of the instructions about where and how to use these. Pharmacies typically print the medication instructions only on the boxes and not directly on the medication tubes.   If your medication is too expensive, please contact our office at [phone] option 4 or send [place] a message through MyChart.   We are unable to tell what your co-pay for medications will be in advance as this is different depending on your insurance coverage. However, we may be able to find a substitute medication at lower cost or fill out paperwork to get insurance to cover a needed medication.   If a prior authorization is required to get your medication covered by your insurance company, please allow [place] 1-2 business days to complete this process.  Drug prices often vary depending on where the prescription is filled and some pharmacies may offer cheaper prices.  The website www.goodrx.com contains coupons for medications through different pharmacies. The prices here do not account for what the cost may be with help from insurance (it may be cheaper with your insurance), but the website can give you the price if you did not use any insurance.  - You can print the associated coupon and take it with your prescription to the pharmacy.  - You may also stop by our office during regular business hours and pick up a GoodRx coupon card.  - If you need your prescription sent electronically to a different pharmacy, notify our office through [facility] or by phone at [phone] option 4.

## 2021-07-16 NOTE — Progress Notes (Signed)
   Follow-Up Visit   Subjective  Emma Rogers is a 55 y.o. female who presents for the following: Psoriasis (Patient here today for 1 month psoriasis at arms and legs. Patient currently on Taltz since end of July. She was using samples of Otezla, given at last appointment to take until Altamease Oiler started working but pt unable to tolerate. Patient was also given samples of Eldred Manges and Vtama at last appointment. Patient advises her psoriasis has gotten worse and is now uncomfortable.  ).  Patient was previously on Humira which controlled joint pain but skin worsened and she flared in May with strep infection.   The following portions of the chart were reviewed this encounter and updated as appropriate:   Tobacco  Allergies  Meds  Problems  Med Hx  Surg Hx  Fam Hx      Review of Systems:  No other skin or systemic complaints except as noted in HPI or Assessment and Plan.  Objective  Well appearing patient in no apparent distress; mood and affect are within normal limits.  A focused examination was performed including arms, legs, scalp. Relevant physical exam findings are noted in the Assessment and Plan.  arms, legs Widespread scaly pink plaques at arms, legs > 10% BSA   Assessment & Plan  Psoriasis arms, legs  Chronic condition with duration or expected duration over one year. Condition is bothersome to patient. Currently flared with skin and also still reporting some joint pain.  Patient flared after Strep infection but with persistent plaques despite treatment for Strep, topical therapy for psoriasis and Talz.  Psoriasis is a chronic non-curable, but treatable genetic/hereditary disease that may have other systemic features affecting other organ systems such as joints (Psoriatic Arthritis). It is associated with an increased risk of inflammatory bowel disease, heart disease, non-alcoholic fatty liver disease, and depression.    Consider switching back to Humira or starting  Skyrizi or IL-12/23 inhibitor.  Discussed with Dr. Allena Katz, rheumatologist, who favors Stelara given longer evidence for joint protection. His office will process the switch.   Start NVUVB twice weekly.   Continue Taltz until approved for Stelara with Dr. Allena Katz. Patient's last injections was early October.  Continue Wynzora once daily Continue Vtama once daily Recommend Vaseline daily   Related Medications Calcipotriene-Betameth Diprop (WYNZORA) 0.005-0.064 % CREA Apply 1 application topically daily. Avoid applying to face, groin, and axilla. Use as directed. Risk of skin atrophy with long-term use reviewed.  Return for Psoriasis 2-3 months, NVUVB twice weekly.  Anise Salvo, RMA, am acting as scribe for Darden Dates, MD .   Documentation: I have reviewed the above documentation for accuracy and completeness, and I agree with the above.  Darden Dates, MD

## 2021-07-16 NOTE — Progress Notes (Signed)
Patient treated in the lightbox/NBUVB for 1 minutes and 38 seconds.   

## 2021-07-18 ENCOUNTER — Ambulatory Visit: Payer: No Typology Code available for payment source

## 2021-07-19 ENCOUNTER — Other Ambulatory Visit: Payer: Self-pay

## 2021-07-29 ENCOUNTER — Other Ambulatory Visit (HOSPITAL_COMMUNITY): Payer: Self-pay

## 2021-07-29 MED ORDER — USTEKINUMAB 45 MG/0.5ML ~~LOC~~ SOSY: PREFILLED_SYRINGE | SUBCUTANEOUS | 1 refills | Status: DC

## 2021-07-29 MED ORDER — USTEKINUMAB 45 MG/0.5ML ~~LOC~~ SOSY: PREFILLED_SYRINGE | SUBCUTANEOUS | 5 refills | Status: DC

## 2021-07-30 ENCOUNTER — Other Ambulatory Visit (HOSPITAL_COMMUNITY): Payer: Self-pay

## 2021-08-01 ENCOUNTER — Other Ambulatory Visit: Payer: Self-pay

## 2021-08-01 ENCOUNTER — Other Ambulatory Visit (HOSPITAL_COMMUNITY): Payer: Self-pay

## 2021-08-01 ENCOUNTER — Ambulatory Visit: Payer: No Typology Code available for payment source | Attending: Family Medicine | Admitting: Pharmacist

## 2021-08-01 DIAGNOSIS — Z79899 Other long term (current) drug therapy: Secondary | ICD-10-CM

## 2021-08-01 MED ORDER — USTEKINUMAB 45 MG/0.5ML ~~LOC~~ SOSY
PREFILLED_SYRINGE | SUBCUTANEOUS | 5 refills | Status: DC
  Filled 2021-08-01 – 2021-08-22 (×2): qty 0.5, fill #0
  Filled 2021-10-29: qty 0.5, 28d supply, fill #0
  Filled 2022-01-30: qty 0.5, 84d supply, fill #1

## 2021-08-01 MED ORDER — USTEKINUMAB 45 MG/0.5ML ~~LOC~~ SOSY
PREFILLED_SYRINGE | SUBCUTANEOUS | 1 refills | Status: DC
  Filled 2021-08-01: qty 0.5, fill #0
  Filled 2021-08-01 (×2): qty 0.5, 28d supply, fill #0
  Filled 2021-08-27: qty 0.5, 28d supply, fill #1

## 2021-08-01 NOTE — Progress Notes (Signed)
S: Patient presents for review of their specialty medication therapy.  Patient is about to start taking Stelara for psoriasis. Patient is managed by Dr. Posey Pronto for this. She has taken biologic medications before. She hx of tx failure with Humira and Taltz.   Adherence: has not yet started   Efficacy: has not yet started  Dosing:  Crohn disease:  Induction: IV: ?55 kg: 260 mg as single dose >55 kg to 85 kg: 390 mg as single dose >85 kg: 520 mg as single dose Maintenance: SubQ: 90 mg every 8 weeks; begin maintenance dosing 8 weeks after the IV induction dose. Plaque psoriasis: SubQ: Initial and maintenance: Note: Following an interruption in therapy, re-treatment may be initiated at the initial dosing interval. ?100 kg: 45 mg at 0 and 4 weeks, and then every 12 weeks thereafter >100 kg: 90 mg at 0 and 4 weeks, and then every 12 weeks thereafter. Note: Doses of 45 mg given to patients >100 kg were also efficacious; however, 90 mg is the recommended dose in these patients due to greater efficacy. Psoriatic arthritis: SubQ: Note: When used for psoriatic arthritis, may be administered alone or in combination with methotrexate. Initial and maintenance: 45 mg at 0 and 4 weeks, and then every 12 weeks thereafter. Coexistent psoriatic arthritis and moderate to severe plaque psoriasis in patients >100 kg: Initial and maintenance: 90 mg at 0 and 4 weeks, and then every 12 weeks thereafter.   Dose adjustments: Renal: no dose adjustments (has not been studied) Hepatic: no dose adjustments (has not been studied) Special populations:  Patients >100 kg: May require higher dose to achieve adequate serum levels.  Drug-drug interactions:none  Screening: TB test: completed  Hepatitis: completed   Monitoring: S/sx of infection: none CBC: wnl 01/24/2021 Reversible posterior leukoencephalopathy syndrome (RPLS - sx include headache, seizures, confusion, and visual disturbances): none Squamous cell  skin carcinoma: none  Dosage form specific issues:  Latex: Packaging may contain natural latex rubber.  Polysorbate 80: Some dosage forms may contain polysorbate 80 (also known as Tweens). Hypersensitivity reactions, usually a delayed reaction, have been reported following exposure to pharmaceutical products containing polysorbate 80 in certain individuals Dolly Rias, 2002; Lucente 2000; Lollie Marrow, [place]). Thrombocytopenia, ascites, pulmonary deterioration, and renal and hepatic failure have been reported in premature neonates after receiving parenteral products containing polysorbate 80 (Alade, 1986; CDC, 1984). See manufacturer's labeling.   O:     Lab Results  Component Value Date   WBC 7.2 01/24/2021   HGB 14.4 01/24/2021   HCT 43.4 01/24/2021   MCV 94 01/24/2021   PLT 259 01/24/2021      Chemistry      Component Value Date/Time   NA 139 06/24/2021 1539   NA 135 (L) 06/15/2014 2324   K 4.1 06/24/2021 1539   K 3.3 (L) 06/15/2014 2324   CL 100 06/24/2021 1539   CL 98 06/15/2014 2324   CO2 25 06/24/2021 1539   CO2 31 06/15/2014 2324   BUN 25 (H) 06/24/2021 1539   BUN 15 06/15/2014 2324   CREATININE 1.09 (H) 06/24/2021 1539   CREATININE 1.14 (H) 03/01/2019 1452      Component Value Date/Time   CALCIUM 9.4 06/24/2021 1539   CALCIUM 9.2 06/15/2014 2324   ALKPHOS 63 06/24/2021 1539   AST 18 06/24/2021 1539   ALT 20 06/24/2021 1539   BILITOT 0.3 06/24/2021 1539       A/P: 1. Medication review: patient about to start Stelara for psoriasis and is tolerating it  well. Reviewed the medication with the patient, including the following: Stelara, ustekinumab, is a TNF? blocker. Patient educated on purpose, proper use and potential adverse effects of Stelara.  Following instruction patient verbalized understanding of treatment plan. There is an increased risk of infection and malignancy with this medication. Do not give patients live vaccinations while they are on this medication.  Subcutaneous: Administer by subcutaneous injection into the top of the thigh, abdomen, upper arms, or buttocks. Rotate sites. Do not inject into tender, bruised, erythematous, or indurated skin. Avoid areas of skin where psoriasis is present. Discard any unused portion. Intended for use under supervision of physician; self-injection may occur after proper training. If using the single-dose vial, a 1 mL syringe with a 27-gauge 1/2 inch needle is recommended. No recommendations for any changes.  Butch Penny, PharmD, P[place]y [place], CPP Clinical Pharmacist [facility] [phone]

## 2021-08-02 ENCOUNTER — Other Ambulatory Visit (HOSPITAL_COMMUNITY): Payer: Self-pay

## 2021-08-07 ENCOUNTER — Other Ambulatory Visit: Payer: Self-pay

## 2021-08-07 MED ORDER — HYDROCODONE-ACETAMINOPHEN 5-325 MG PO TABS
ORAL_TABLET | ORAL | 0 refills | Status: DC
Start: 2021-08-07 — End: 2021-12-24
  Filled 2021-08-07: qty 21, 7d supply, fill #0

## 2021-08-20 ENCOUNTER — Encounter: Payer: Self-pay | Admitting: Family Medicine

## 2021-08-22 ENCOUNTER — Other Ambulatory Visit (HOSPITAL_COMMUNITY): Payer: Self-pay

## 2021-08-23 ENCOUNTER — Other Ambulatory Visit: Payer: Self-pay

## 2021-08-26 ENCOUNTER — Ambulatory Visit (INDEPENDENT_AMBULATORY_CARE_PROVIDER_SITE_OTHER): Payer: No Typology Code available for payment source | Admitting: Family Medicine

## 2021-08-26 ENCOUNTER — Other Ambulatory Visit: Payer: Self-pay

## 2021-08-26 ENCOUNTER — Encounter: Payer: Self-pay | Admitting: Family Medicine

## 2021-08-26 VITALS — BP 100/67 | HR 66 | Temp 98.7°F | Ht 65.0 in | Wt 205.0 lb

## 2021-08-26 DIAGNOSIS — E538 Deficiency of other specified B group vitamins: Secondary | ICD-10-CM

## 2021-08-26 DIAGNOSIS — R5383 Other fatigue: Secondary | ICD-10-CM | POA: Diagnosis not present

## 2021-08-26 DIAGNOSIS — E559 Vitamin D deficiency, unspecified: Secondary | ICD-10-CM

## 2021-08-26 MED ORDER — SAXENDA 18 MG/3ML ~~LOC~~ SOPN
3.0000 mg | PEN_INJECTOR | Freq: Every day | SUBCUTANEOUS | 1 refills | Status: AC
  Filled 2021-08-26: qty 45, 90d supply, fill #0
  Filled 2022-03-21: qty 15, 30d supply, fill #1
  Filled 2022-04-03: qty 45, 90d supply, fill #1

## 2021-08-26 NOTE — Assessment & Plan Note (Signed)
Checking labs today. Await results. Treat as needed.  

## 2021-08-26 NOTE — Progress Notes (Signed)
BP 100/67   Pulse 66   Temp 98.7 F (37.1 C)   Ht 5\' 5"  (1.651 m)   Wt 205 lb (93 kg)   SpO2 99%   BMI 34.11 kg/m    Subjective:    Patient ID: , female    DOB: [birth year], 55 y.o.   MRN: [id number]  HPI: Emma Rogers is a 55 y.o. female  Chief Complaint  Patient presents with   Obesity    Patient here to follow up on weight loss with Saxenda   FATIGUE Duration:  last week for a couple of days Severity: severe  Onset: sudden Context when symptoms started:  unknown Symptoms improve with rest: no  Depressive symptoms: no Stress/anxiety: yes Insomnia: no  Snoring: no Observed apnea by bed partner: no Daytime hypersomnolence:yes Wakes feeling refreshed: yes History of sleep study: yes Dysnea on exertion:  no Orthopnea/PND: no Chest pain: no Chronic cough: no Lower extremity edema: no Arthralgias:no Myalgias: no Weakness: no Rash: no  OBESITY Duration: chronic Previous attempts at weight loss: yes Complications of obesity: depression, hyperlipidemia Peak weight: 213 Weight loss goal: to be healthy Weight loss to date: 8lbs Requesting obesity pharmacotherapy: yes Current weight loss supplements/medications: yes Previous weight loss supplements/meds: no  Relevant past medical, surgical, family and social history reviewed and updated as indicated. Interim medical history since our last visit reviewed. Allergies and medications reviewed and updated.  Review of Systems  Constitutional: Negative.   Respiratory: Negative.    Cardiovascular: Negative.   Gastrointestinal: Negative.   Musculoskeletal: Negative.   Neurological: Negative.   Psychiatric/Behavioral: Negative.     Per HPI unless specifically indicated above     Objective:    BP 100/67   Pulse 66   Temp 98.7 F (37.1 C)   Ht 5\' 5"  (1.651 m)   Wt 205 lb (93 kg)   SpO2 99%   BMI 34.11 kg/m   Wt Readings from Last 3 Encounters:  08/26/21 205 lb (93 kg)  07/15/21 212 lb 9.6  oz (96.4 kg)  07/02/21 213 lb (96.6 kg)    Physical Exam Vitals and nursing note reviewed.  Constitutional:      General: She is not in acute distress.    Appearance: Normal appearance. She is not ill-appearing, toxic-appearing or diaphoretic.  HENT:     Head: Normocephalic and atraumatic.     Right Ear: External ear normal.     Left Ear: External ear normal.     Nose: Nose normal.     Mouth/Throat:     Mouth: Mucous membranes are moist.     Pharynx: Oropharynx is clear.  Eyes:     General: No scleral icterus.       Right eye: No discharge.        Left eye: No discharge.     Extraocular Movements: Extraocular movements intact.     Conjunctiva/sclera: Conjunctivae normal.     Pupils: Pupils are equal, round, and reactive to light.  Cardiovascular:     Rate and Rhythm: Normal rate and regular rhythm.     Pulses: Normal pulses.     Heart sounds: Normal heart sounds. No murmur heard.   No friction rub. No gallop.  Pulmonary:     Effort: Pulmonary effort is normal. No respiratory distress.     Breath sounds: Normal breath sounds. No stridor. No wheezing, rhonchi or rales.  Chest:     Chest wall: No tenderness.  Musculoskeletal:  General: Normal range of motion.     Cervical back: Normal range of motion and neck supple.  Skin:    General: Skin is warm and dry.     Capillary Refill: Capillary refill takes less than 2 seconds.     Coloration: Skin is not jaundiced or pale.     Findings: No bruising, erythema, lesion or rash.  Neurological:     General: No focal deficit present.     Mental Status: She is alert and oriented to person, place, and time. Mental status is at baseline.  Psychiatric:        Mood and Affect: Mood normal.        Behavior: Behavior normal.        Thought Content: Thought content normal.        Judgment: Judgment normal.    Results for orders placed or performed in visit on 07/02/21  WET PREP FOR TRICH, YEAST, CLUE   Specimen: Sterile Swab    Sterile Swab  Result Value Ref Range   Trichomonas Exam Negative Negative   Yeast Exam Negative Negative   Clue Cell Exam Positive (A) Negative  Urinalysis, Routine w reflex microscopic  Result Value Ref Range   Specific Gravity, UA 1.015 1.005 - 1.030   pH, UA 7.0 5.0 - 7.5   Color, UA Yellow Yellow   Appearance Ur Clear Clear   Leukocytes,UA Negative Negative   Protein,UA Negative Negative/Trace   Glucose, UA Negative Negative   Ketones, UA Negative Negative   RBC, UA Negative Negative   Bilirubin, UA Negative Negative   Urobilinogen, Ur 0.2 0.2 - 1.0 mg/dL   Nitrite, UA Negative Negative      Assessment & Plan:   Problem List Items Addressed This Visit       Other   Vitamin D deficiency    Checking labs today. Await results. Treat as needed.       Vitamin B12 deficiency    Checking labs today. Await results. Treat as needed.       Relevant Orders   B12   Morbid obesity (HCC)    Congratulated patient on 8lb (4%) weight loss. Continue saxenda. Continue to monitor. Call with any concerns.       Relevant Medications   Liraglutide -Weight Management (SAXENDA) 18 MG/3ML SOPN   Other Visit Diagnoses     Fatigue, unspecified type    -  Primary   Will check labs. Await results. Treat as needed. Resolved now. ?viral vs. prodromal for psoriatic arthritis. Continue to monitor.    Relevant Orders   Comprehensive metabolic panel   CBC with Differential/Platelet   TSH   VITAMIN D 25 Hydroxy (Vit-D Deficiency, Fractures)        Follow up plan: Return if symptoms worsen or fail to improve.

## 2021-08-26 NOTE — Assessment & Plan Note (Signed)
Congratulated patient on 8lb (4%) weight loss. Continue saxenda. Continue to monitor. Call with any concerns.

## 2021-08-27 ENCOUNTER — Other Ambulatory Visit: Payer: Self-pay | Admitting: Family Medicine

## 2021-08-27 ENCOUNTER — Other Ambulatory Visit (HOSPITAL_COMMUNITY): Payer: Self-pay

## 2021-08-27 ENCOUNTER — Other Ambulatory Visit: Payer: Self-pay

## 2021-08-27 DIAGNOSIS — E538 Deficiency of other specified B group vitamins: Secondary | ICD-10-CM

## 2021-08-27 LAB — CBC WITH DIFFERENTIAL/PLATELET
Basophils Absolute: 0 10*3/uL (ref 0.0–0.2)
Basos: 1 %
EOS (ABSOLUTE): 0.2 10*3/uL (ref 0.0–0.4)
Eos: 4 %
Hematocrit: 40 % (ref 34.0–46.6)
Hemoglobin: 13.9 g/dL (ref 11.1–15.9)
Immature Grans (Abs): 0 10*3/uL (ref 0.0–0.1)
Immature Granulocytes: 0 %
Lymphocytes Absolute: 1.6 10*3/uL (ref 0.7–3.1)
Lymphs: 28 %
MCH: 32.1 pg (ref 26.6–33.0)
MCHC: 34.8 g/dL (ref 31.5–35.7)
MCV: 92 fL (ref 79–97)
Monocytes Absolute: 0.3 10*3/uL (ref 0.1–0.9)
Monocytes: 6 %
Neutrophils Absolute: 3.4 10*3/uL (ref 1.4–7.0)
Neutrophils: 61 %
Platelets: 296 10*3/uL (ref 150–450)
RBC: 4.33 x10E6/uL (ref 3.77–5.28)
RDW: 12.6 % (ref 11.7–15.4)
WBC: 5.5 10*3/uL (ref 3.4–10.8)

## 2021-08-27 LAB — COMPREHENSIVE METABOLIC PANEL
ALT: 10 IU/L (ref 0–32)
AST: 13 IU/L (ref 0–40)
Albumin/Globulin Ratio: 1.6 (ref 1.2–2.2)
Albumin: 4.3 g/dL (ref 3.8–4.9)
Alkaline Phosphatase: 56 IU/L (ref 44–121)
BUN/Creatinine Ratio: 15 (ref 9–23)
BUN: 17 mg/dL (ref 6–24)
Bilirubin Total: 0.6 mg/dL (ref 0.0–1.2)
CO2: 25 mmol/L (ref 20–29)
Calcium: 9.5 mg/dL (ref 8.7–10.2)
Chloride: 100 mmol/L (ref 96–106)
Creatinine, Ser: 1.13 mg/dL — ABNORMAL HIGH (ref 0.57–1.00)
Globulin, Total: 2.7 g/dL (ref 1.5–4.5)
Glucose: 99 mg/dL (ref 70–99)
Potassium: 3.9 mmol/L (ref 3.5–5.2)
Sodium: 140 mmol/L (ref 134–144)
Total Protein: 7 g/dL (ref 6.0–8.5)
eGFR: 58 mL/min/{1.73_m2} — ABNORMAL LOW (ref >59)

## 2021-08-27 LAB — TSH: TSH: 0.929 u[IU]/mL (ref 0.450–4.500)

## 2021-08-27 LAB — VITAMIN B12: Vitamin B-12: 231 pg/mL — ABNORMAL LOW (ref 232–1245)

## 2021-08-27 LAB — VITAMIN D 25 HYDROXY (VIT D DEFICIENCY, FRACTURES): Vit D, 25-Hydroxy: 41.8 ng/mL (ref 30.0–100.0)

## 2021-08-27 MED ORDER — CYANOCOBALAMIN 1000 MCG/ML IJ SOLN: 1000.0000 ug | INTRAMUSCULAR | Status: AC

## 2021-08-27 MED ORDER — CYANOCOBALAMIN 1000 MCG/ML IJ SOLN
1000.0000 ug | INTRAMUSCULAR | Status: AC
  Administered 2021-08-29 – 2021-09-20 (×3): 1000 ug via INTRAMUSCULAR

## 2021-08-29 ENCOUNTER — Ambulatory Visit (INDEPENDENT_AMBULATORY_CARE_PROVIDER_SITE_OTHER): Payer: No Typology Code available for payment source

## 2021-08-29 ENCOUNTER — Other Ambulatory Visit: Payer: Self-pay

## 2021-08-29 DIAGNOSIS — E538 Deficiency of other specified B group vitamins: Secondary | ICD-10-CM

## 2021-09-02 ENCOUNTER — Inpatient Hospital Stay (HOSPITAL_COMMUNITY): Admission: RE | Admit: 2021-09-02 | Payer: No Typology Code available for payment source | Source: Ambulatory Visit

## 2021-09-02 ENCOUNTER — Other Ambulatory Visit (HOSPITAL_COMMUNITY): Payer: Self-pay | Admitting: Family Medicine

## 2021-09-02 ENCOUNTER — Encounter (HOSPITAL_COMMUNITY): Payer: Self-pay

## 2021-09-02 DIAGNOSIS — Z1231 Encounter for screening mammogram for malignant neoplasm of breast: Secondary | ICD-10-CM

## 2021-09-03 ENCOUNTER — Other Ambulatory Visit: Payer: Self-pay

## 2021-09-04 ENCOUNTER — Encounter: Payer: Self-pay | Admitting: Emergency Medicine

## 2021-09-04 ENCOUNTER — Ambulatory Visit
Admission: EM | Admit: 2021-09-04 | Discharge: 2021-09-04 | Disposition: A | Discharge status: Home / Self Care | Payer: No Typology Code available for payment source | Attending: Medical Oncology | Admitting: Medical Oncology

## 2021-09-04 ENCOUNTER — Other Ambulatory Visit: Payer: Self-pay

## 2021-09-04 ENCOUNTER — Encounter: Payer: Self-pay | Admitting: Family Medicine

## 2021-09-04 ENCOUNTER — Ambulatory Visit: Payer: Self-pay | Admitting: *Deleted

## 2021-09-04 DIAGNOSIS — R109 Unspecified abdominal pain: Secondary | ICD-10-CM | POA: Diagnosis present

## 2021-09-04 DIAGNOSIS — Z789 Other specified health status: Secondary | ICD-10-CM | POA: Diagnosis present

## 2021-09-04 DIAGNOSIS — R300 Dysuria: Secondary | ICD-10-CM | POA: Insufficient documentation

## 2021-09-04 DIAGNOSIS — R509 Fever, unspecified: Secondary | ICD-10-CM | POA: Diagnosis present

## 2021-09-04 DIAGNOSIS — R10A0 Flank pain, unspecified side: Secondary | ICD-10-CM

## 2021-09-04 LAB — POCT URINALYSIS DIP (MANUAL ENTRY)
Bilirubin, UA: NEGATIVE
Glucose, UA: NEGATIVE mg/dL
Nitrite, UA: POSITIVE — AB
Protein Ur, POC: >=300 mg/dL — AB
Spec Grav, UA: >=1.03 — AB (ref 1.010–1.025)
Urobilinogen, UA: 0.2 E.U./dL
pH, UA: 5.5 (ref 5.0–8.0)

## 2021-09-04 MED ORDER — SULFAMETHOXAZOLE-TRIMETHOPRIM 800-160 MG PO TABS: 1.0000 | ORAL_TABLET | Freq: Two times a day (BID) | ORAL | 0 refills | Status: DC

## 2021-09-04 MED ORDER — ONDANSETRON HCL 4 MG PO TABS
4.0000 mg | ORAL_TABLET | Freq: Four times a day (QID) | ORAL | 0 refills | Status: DC
  Filled 2021-09-04: qty 12, 3d supply, fill #0

## 2021-09-04 MED ORDER — SULFAMETHOXAZOLE-TRIMETHOPRIM 800-160 MG PO TABS
ORAL_TABLET | ORAL | 0 refills | Status: DC
  Filled 2021-09-04: qty 14, 7d supply, fill #0

## 2021-09-04 NOTE — ED Triage Notes (Signed)
Pt here with suprapubic cramping and right flank pain with nausea x 4 days. Pt feels increased urgency and pressure.

## 2021-09-04 NOTE — Telephone Encounter (Signed)
Please see notes below.  Pt is requesting medication, appointment scheduled for 09/05/2021.

## 2021-09-04 NOTE — Telephone Encounter (Signed)
°  Chief Complaint: UTI symptoms worsening  with right side flank pain  Symptoms: right side flank pain, urine dip results per patient : 3+ blood, 3+ protein , 1+ leukocytes Frequency: na Pertinent Negatives: Patient denies fever Disposition: [] ED /[] Urgent Care (no appt availability in office) / [x] Appointment(In office/virtual)/ []  [place] [facility]/ [] Home Care/ [] Refused Recommended Disposition  Additional Notes:  C/o right flank pain since notifying clinic of UTI sx. Urine dip test sent via My Chart from patient  to PCP. Requesting appt or medication today . Worried about worsening sx throughout night. Recommended increase water / cranberry juice or go to UC . [facility] pharmacy closes at 5:30 pm per patient . Please advise .  Clinic contacted and no available appt today

## 2021-09-04 NOTE — ED Provider Notes (Signed)
Roderic Palau    CSN: [id number] Arrival date & time: 09/04/21  1539      History   Chief Complaint Chief Complaint  Patient presents with   Dysuria   Nausea   Abdominal Pain    HPI Emma Rogers is a 55 y.o. female.   HPI  Dysuria: Pt with a complex medical history reports that for the past 4 days she has dysuria, urinary frequency, suprapubic pressure sensation and mild nausea without vomiting. This has progressively worsened and today she has bilateral flank pain. No known fevers but has a low grade in office today. No vomiting or severe pain. She has not tried anything for symptoms.   Past Medical History:  Diagnosis Date   Abnormal MRI 04/03/2019   Allergic rhinitis    Anxiety    Asthma    Depression    Headache(784.0)    Psoriatic arthritis ([place])    Renal insufficiency    Stage 3 kidney disease.    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   Vertigo 06/16/2019   MDD (major depressive disorder), recurrent episode, moderate ([place]) 06/07/2019   GAD (generalized anxiety disorder) 05/26/2019   Memory deficit 04/26/2019   Chronic low back pain without sciatica 03/08/2019   Hip pain, acute, left 03/08/2019   Seizure-like activity ([place]) 03/08/2019   Moderate persistent asthma 01/27/2019   Cervical spinal stenosis 12/30/2018   Benign essential tremor 12/30/2018   Chronic venous insufficiency 03/10/2018   Lymphedema 03/10/2018   Anxiety 10/13/2017   Familial hyperlipidemia 10/10/2017   Chronic kidney disease, stage III (moderate) ([place]) 09/03/2017   Vitamin D deficiency 09/03/2017   Vitamin B12 deficiency 09/03/2017   Insomnia 08/11/2017   Menopausal symptoms 08/11/2017   Migraine without aura and without status migrainosus, not intractable 02/21/2017    Prediabetes 02/20/2017   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 Caryl Comes   Lobe hemithroidectomy     Right   TOTAL ABDOMINAL HYSTERECTOMY  2009   still has 1 ovary    OB History   No obstetric history on file.      Home Medications    Prior to Admission medications   Medication Sig Start Date End Date Taking? Authorizing Provider  albuterol (VENTOLIN HFA) 108 (90 Base) MCG/ACT inhaler INHALE 2 PUFFS INTO THE LUNGS EVERY 6 (SIX) HOURS AS NEEDED FOR WHEEZING OR SHORTNESS OF BREATH. 07/26/20 07/26/21  Cannady, Henrine Screws T, NP  atorvastatin (LIPITOR) 40 MG tablet Take 1 tablet (40 mg total) by mouth at bedtime. 06/24/21   Johnson, Megan P, DO  budesonide-formoterol (SYMBICORT) 80-4.5 MCG/ACT inhaler Inhale 2 puffs into the lungs 2 (two) times daily. 12/10/18   Flora Lipps, MD  buPROPion (WELLBUTRIN XL) 300 MG 24 hr tablet Take 1 tablet (300 mg total) by mouth daily. 06/24/21   Johnson, Megan P, DO  Calcipotriene-Betameth Diprop [facility]) 0.005-0.064 % CREA Apply 1 application topically daily. Avoid applying to face, groin, and axilla. Use as directed. Risk of skin atrophy with long-term use reviewed. 06/11/21   Alfonso Patten, MD  furosemide (LASIX) 20 MG tablet Take 1 tablet (20 mg total) by mouth daily. 06/24/21   Park Liter P, DO  HYDROcodone-acetaminophen (NORCO/VICODIN) 5-325 MG tablet Take 1 tablet by mouth every 8 (eight) hours as needed for Pain for up to 7 days 08/07/21     hydrocortisone 2.5 % cream APPLY TOPICALLY 2 (TWO) TIMES DAILY AS NEEDED (RASH). AFFECTED AREAS FACE, GROIN, UNDERARMS FOR UP TO 3 WEEKS 10/25/20 10/25/21  Moye, [place], MD  ibuprofen (ADVIL) 200 MG tablet Take 200 mg by mouth every 6 (six) hours as needed.     [provider]  Insulin Pen Needle 31G X 5 MM MISC Use as directed. 07/15/21   Johnson, Megan P, DO  ipratropium (ATROVENT) 0.03 % nasal spray PLACE 2 SPRAYS INTO BOTH NOSTRILS EVERY 12 HOURS. 09/19/20 09/19/21  Montine Circle, PA-C  Liraglutide -Weight Management (SAXENDA) 18 MG/3ML SOPN Inject 3 mg into the skin daily. 08/26/21 11/25/21  Park Liter P, DO  rizatriptan (MAXALT) 10 MG tablet Take 1 tablet (10 mg total) by mouth as needed for migraine. May repeat in 2 hours if needed 06/24/21   Park Liter P, DO  traZODone (DESYREL) 100 MG tablet Take 0.5-1 tablets (50-100 mg total) by mouth at bedtime as needed. for sleep 06/24/21   Park Liter P, DO  ustekinumab [facility]) 45 MG/0.5ML SOSY injection Inject into the skin at week 4 then every 12 weeks as directed 08/01/21   Tresa Garter, MD  ustekinumab Delsa Grana) 45 MG/0.5ML SOSY injection Inject into the skin at week 0 and week 4 as directed 08/01/21   Tresa Garter, MD  VITAMIN D PO Take 1 tablet by mouth daily.    [provider]  sertraline (ZOLOFT) 100 MG tablet Take 1 tablet (100 mg total) by mouth daily. Patient not taking: No sig reported 05/26/19 12/30/19  Ursula Alert, MD    Family History Family History  Problem Relation Age of Onset   Heart disease Father        pacemaker,arrythmias   Lung disease Father    Asthma Other    Coronary artery disease Other        Female 1st degree relative <50   Lung cancer Other    Prostate cancer Other        1st degree relative <50   COPD Maternal Grandmother    Breast cancer Maternal Grandmother    Heart disease Maternal Grandfather    Liver disease Paternal Grandmother    Heart disease Paternal Grandfather     Social History Social History   Tobacco Use   Smoking status: Never   Smokeless tobacco: Never  Vaping Use   Vaping Use: Never used  Substance Use Topics   Alcohol use: No    Alcohol/week: 0.0 standard drinks   Drug use: No      Allergies   Amoxicillin, Clavulanic acid, and Morphine and related   Review of Systems Review of Systems  As stated above in HPI Physical Exam Triage Vital Signs ED Triage Vitals  Enc Vitals Group     BP 09/04/21 1600 (!) 146/99     Pulse Rate 09/04/21 1600 76     Resp 09/04/21 1600 18     Temp 09/04/21 1600 99.1 F (37.3 C)     Temp Source 09/04/21 1600 Oral     SpO2 09/04/21 1600 95 %     Weight --      Height --      Head Circumference --  Peak Flow --      Pain Score 09/04/21 1609 6     Pain Loc --      Pain Edu? --      Excl. in GC? --    No data found.  Updated Vital Signs BP (!) 146/99 (BP Location: Left Wrist)    Pulse 76    Temp 99.1 F (37.3 C) (Oral)    Resp 18    SpO2 95%   Physical Exam Vitals and nursing note reviewed.  Constitutional:      General: She is not in acute distress.    Appearance: She is well-developed. She is obese. She is not ill-appearing, toxic-appearing or diaphoretic.  HENT:     Head: Normocephalic and atraumatic.     Mouth/Throat:     Mouth: Mucous membranes are moist.  Cardiovascular:     Rate and Rhythm: Normal rate and regular rhythm.     Heart sounds: Normal heart sounds.  Pulmonary:     Effort: Pulmonary effort is normal.     Breath sounds: Normal breath sounds.  Abdominal:     General: Abdomen is flat. Bowel sounds are normal. There is no distension.     Palpations: Abdomen is soft. There is no fluid wave, hepatomegaly, splenomegaly or mass.     Tenderness: There is abdominal tenderness (mild) in the suprapubic area. There is right CVA tenderness. There is no left CVA tenderness, guarding or rebound. Negative signs include Murphy's sign and McBurney's sign.     Hernia: No hernia is present.  Skin:    General: Skin is warm.     Coloration: Skin is not cyanotic, jaundiced or pale.  Neurological:     Mental Status: She is alert and oriented to person, place, and time.     UC Treatments / Results   Labs (all labs ordered are listed, but only abnormal results are displayed) Labs Reviewed  POCT URINALYSIS DIP (MANUAL ENTRY) - Abnormal; Notable for the following components:      Result Value   Color, UA brown (*)    Clarity, UA cloudy (*)    Ketones, POC UA trace (5) (*)    Spec Grav, UA >=1.030 (*)    Blood, UA large (*)    Protein Ur, POC >=300 (*)    Nitrite, UA Positive (*)    Leukocytes, UA Small (1+) (*)    All other components within normal limits    EKG   Radiology No results found.  Procedures Procedures (including critical care time)  Medications Ordered in UC Medications - No data to display  Initial Impression / Assessment and Plan / UC Course  I have reviewed the triage vital signs and the nursing notes.  Pertinent labs & imaging results that were available during my care of the patient were reviewed by me and considered in my medical decision making (see chart for details).     New. Appears to be pyelonephritis. Per UpToDate Choice of ABX includes bactrim, cipro and levaquin. She recalls tolerating Bactrim well in the past and given her CKD this is the better and safer choice. We discussed how to take along with common potential side effects and precautions. Discussed red flag signs and symptoms. Fluids, rest, tylenol PRN. Also sending in zofran for her nausea. Follow up with [place] or PCP in a few days to ensure she is improving or sooner with ER if needed.  Final Clinical Impressions(s) / UC Diagnoses   Final diagnoses:  None  Discharge Instructions   None    ED Prescriptions   None    PDMP not reviewed this encounter.   Hughie Closs, [place] 09/04/21 1640

## 2021-09-04 NOTE — Telephone Encounter (Signed)
Reason for Disposition  Side (flank) or lower back pain present  Answer Assessment - Initial Assessment Questions 1. SYMPTOM: "What's the main symptom you're concerned about?" (e.g., frequency, incontinence)     UTI sx getting worse. Now with right flank pain  3. PAIN: "Is there any pain?" If Yes, ask: "How bad is it?" (Scale: 1-10; mild, moderate, severe)     Yes  4. CAUSE: "What do you think is causing the symptoms?"     Possible  UTI 5. OTHER SYMPTOMS: "Do you have any other symptoms?" (e.g., fever, flank pain, blood in urine, pain with urination)     Right flank pain , urine test shows 3+ blood, 3+ protein 1+ leukocytes  6. PREGNANCY: "Is there any chance you are pregnant?" "When was your last menstrual period?"     na  Protocols used: Urinary Symptoms-A-AH

## 2021-09-05 ENCOUNTER — Ambulatory Visit: Payer: No Typology Code available for payment source | Admitting: Family Medicine

## 2021-09-05 ENCOUNTER — Encounter: Payer: Self-pay | Admitting: Family Medicine

## 2021-09-05 ENCOUNTER — Other Ambulatory Visit: Payer: Self-pay

## 2021-09-05 ENCOUNTER — Ambulatory Visit (INDEPENDENT_AMBULATORY_CARE_PROVIDER_SITE_OTHER): Payer: No Typology Code available for payment source | Admitting: Family Medicine

## 2021-09-05 VITALS — BP 89/56 | HR 69 | Temp 98.6°F | Wt 206.6 lb

## 2021-09-05 DIAGNOSIS — I959 Hypotension, unspecified: Secondary | ICD-10-CM

## 2021-09-05 DIAGNOSIS — E538 Deficiency of other specified B group vitamins: Secondary | ICD-10-CM | POA: Diagnosis not present

## 2021-09-05 DIAGNOSIS — N12 Tubulo-interstitial nephritis, not specified as acute or chronic: Secondary | ICD-10-CM

## 2021-09-05 NOTE — Telephone Encounter (Signed)
OK to cancel or come in based on her preference

## 2021-09-05 NOTE — Progress Notes (Signed)
BP (!) 89/56    Pulse 69    Temp 98.6 F (37 C)    Wt 206 lb 9.6 oz (93.7 kg)    SpO2 95%    BMI 34.38 kg/m    Subjective:    Patient ID: Emma Rogers, female    DOB: December 29, 1965, 55 y.o.   MRN: [id number]  HPI: Emma Rogers is a 55 y.o. female  Chief Complaint  Patient presents with   Flank Pain    Patient states she began having right side pain a few days ago. Patient was seen at [facility] yesterday and was told she has a kidney infection.    URINARY SYMPTOMS Duration: 4 days Dysuria: yes Urinary frequency: yes Urgency: yes Small volume voids: yes Symptom severity:  severe Urinary incontinence: no Foul odor: yes Hematuria: yes Abdominal pain: yes Back pain: yes Suprapubic pain/pressure: no Flank pain: yes Fever:  yes Vomiting: no Relief with cranberry juice: no Relief with pyridium: no Status: better Previous urinary tract infection: yes Recurrent urinary tract infection: no Sexual activity: monogomous History of sexually transmitted disease: no Vaginal discharge: no Treatments attempted: antibiotics and increasing fluids    Relevant past medical, surgical, family and social history reviewed and updated as indicated. Interim medical history since our last visit reviewed. Allergies and medications reviewed and updated.  Review of Systems  Constitutional: Negative.   Respiratory: Negative.    Cardiovascular: Negative.   Gastrointestinal:  Positive for abdominal pain and nausea. Negative for abdominal distention, anal bleeding, blood in stool, constipation, diarrhea, rectal pain and vomiting.  Genitourinary:  Positive for dysuria, frequency, hematuria and urgency. Negative for decreased urine volume, difficulty urinating, dyspareunia, enuresis, flank pain, genital sores, menstrual problem, pelvic pain, vaginal bleeding, vaginal discharge and vaginal pain.  Musculoskeletal: Negative.   Skin: Negative.   Neurological: Negative.   Psychiatric/Behavioral: Negative.      Per HPI unless specifically indicated above     Objective:    BP (!) 89/56    Pulse 69    Temp 98.6 F (37 C)    Wt 206 lb 9.6 oz (93.7 kg)    SpO2 95%    BMI 34.38 kg/m   Wt Readings from Last 3 Encounters:  09/05/21 206 lb 9.6 oz (93.7 kg)  08/26/21 205 lb (93 kg)  07/15/21 212 lb 9.6 oz (96.4 kg)    Physical Exam Vitals and nursing note reviewed.  Constitutional:      General: She is not in acute distress.    Appearance: Normal appearance. She is not ill-appearing, toxic-appearing or diaphoretic.  HENT:     Head: Normocephalic and atraumatic.     Right Ear: External ear normal.     Left Ear: External ear normal.     Nose: Nose normal.     Mouth/Throat:     Mouth: Mucous membranes are moist.     Pharynx: Oropharynx is clear.  Eyes:     General: No scleral icterus.       Right eye: No discharge.        Left eye: No discharge.     Extraocular Movements: Extraocular movements intact.     Conjunctiva/sclera: Conjunctivae normal.     Pupils: Pupils are equal, round, and reactive to light.  Cardiovascular:     Rate and Rhythm: Normal rate and regular rhythm.     Pulses: Normal pulses.     Heart sounds: Normal heart sounds. No murmur heard.   No friction rub. No gallop.  Pulmonary:  Effort: Pulmonary effort is normal. No respiratory distress.     Breath sounds: Normal breath sounds. No stridor. No wheezing, rhonchi or rales.  Chest:     Chest wall: No tenderness.  Musculoskeletal:        General: Normal range of motion.     Cervical back: Normal range of motion and neck supple.  Skin:    General: Skin is warm and dry.     Capillary Refill: Capillary refill takes less than 2 seconds.     Coloration: Skin is not jaundiced or pale.     Findings: No bruising, erythema, lesion or rash.  Neurological:     General: No focal deficit present.     Mental Status: She is alert and oriented to person, place, and time. Mental status is at baseline.  Psychiatric:        Mood  and Affect: Mood normal.        Behavior: Behavior normal.        Thought Content: Thought content normal.        Judgment: Judgment normal.    Results for orders placed or performed during the hospital encounter of 09/04/21  POCT urinalysis dipstick  Result Value Ref Range   Color, UA brown (A) yellow   Clarity, UA cloudy (A) clear   Glucose, UA negative negative mg/dL   Bilirubin, UA negative negative   Ketones, POC UA trace (5) (A) negative mg/dL   Spec Grav, UA >=1.030 (A) 1.010 - 1.025   Blood, UA large (A) negative   pH, UA 5.5 5.0 - 8.0   Protein Ur, POC >=300 (A) negative mg/dL   Urobilinogen, UA 0.2 0.2 or 1.0 E.U./dL   Nitrite, UA Positive (A) Negative   Leukocytes, UA Small (1+) (A) Negative      Assessment & Plan:   Problem List Items Addressed This Visit   None Visit Diagnoses     Pyelonephritis    -  Primary   Feeling better. Finish abx. Recheck early next week. Continue to monitor.    Hypotension, unspecified hypotension type       Will push fluids. Continue to monitor. Call with any concerns.         Follow up plan: Return Monday or Tuesday.

## 2021-09-06 ENCOUNTER — Other Ambulatory Visit: Payer: Self-pay

## 2021-09-06 ENCOUNTER — Ambulatory Visit
Admission: RE | Admit: 2021-09-06 | Discharge: 2021-09-06 | Disposition: A | Discharge status: Home / Self Care | Payer: No Typology Code available for payment source | Source: Ambulatory Visit | Attending: Family Medicine | Admitting: Family Medicine

## 2021-09-06 ENCOUNTER — Other Ambulatory Visit: Payer: Self-pay | Admitting: Family Medicine

## 2021-09-06 DIAGNOSIS — R109 Unspecified abdominal pain: Secondary | ICD-10-CM | POA: Diagnosis present

## 2021-09-06 DIAGNOSIS — N200 Calculus of kidney: Secondary | ICD-10-CM

## 2021-09-06 DIAGNOSIS — N12 Tubulo-interstitial nephritis, not specified as acute or chronic: Secondary | ICD-10-CM

## 2021-09-06 LAB — URINE CULTURE: Culture: >=100000 — AB

## 2021-09-06 MED ORDER — TRAMADOL HCL 50 MG PO TABS
50.0000 mg | ORAL_TABLET | Freq: Four times a day (QID) | ORAL | 0 refills | Status: AC | PRN
  Filled 2021-09-06: qty 40, 5d supply, fill #0

## 2021-09-06 MED ORDER — TAMSULOSIN HCL 0.4 MG PO CAPS
0.4000 mg | ORAL_CAPSULE | Freq: Every day | ORAL | 0 refills | Status: DC
  Filled 2021-09-06: qty 30, 30d supply, fill #0

## 2021-09-06 NOTE — Progress Notes (Signed)
Patient notified of results  by phone, Kidney stone may be causing the flank pain. Has perinephric stranding. Will get her into urology ASAP. Continue antibiotics. Culture reviewed and sensitive to bactrim. Will start tramadol and flomax. Concern about hypotension with the flomax as she's been running really low. Really push fluids. Continue to monitor closely.

## 2021-09-09 ENCOUNTER — Ambulatory Visit (INDEPENDENT_AMBULATORY_CARE_PROVIDER_SITE_OTHER): Payer: No Typology Code available for payment source | Admitting: Urology

## 2021-09-09 ENCOUNTER — Other Ambulatory Visit: Payer: Self-pay

## 2021-09-09 ENCOUNTER — Encounter: Payer: Self-pay | Admitting: Urology

## 2021-09-09 VITALS — BP 124/83 | HR 73 | Ht 65.0 in | Wt 200.0 lb

## 2021-09-09 DIAGNOSIS — N3001 Acute cystitis with hematuria: Secondary | ICD-10-CM | POA: Diagnosis not present

## 2021-09-09 DIAGNOSIS — N200 Calculus of kidney: Secondary | ICD-10-CM

## 2021-09-09 MED ORDER — SULFAMETHOXAZOLE-TRIMETHOPRIM 800-160 MG PO TABS
1.0000 | ORAL_TABLET | Freq: Two times a day (BID) | ORAL | 0 refills | Status: AC
  Filled 2021-09-09: qty 6, 3d supply, fill #0

## 2021-09-09 NOTE — Progress Notes (Signed)
09/09/21 2:09 PM   Emma Rogers 01-18-1966 [id number]  CC: UTI, possible pyelonephritis, small nonobstructive renal stone  HPI: 55 year old female who presented to urgent care with dysuria, hematuria, lower pelvic pressure, and some left-sided flank pain.  Urinalysis was suspicious for UTI, and culture ultimately grew Enterobacter and she was started on culture appropriate Bactrim.  A CT was also performed which showed no hydronephrosis, nondistended bladder, and a small 2 mm left lower pole nonobstructive stone.  I reviewed prior CT from 2019 and this is minimally enlarged since that time.  Her symptoms have improved on the Bactrim.  She denies any prior history of kidney stones.  She denies recurrent UTIs.  She is also been having some problems with constipation.  PMH: Past Medical History:  Diagnosis Date   Abnormal MRI 04/03/2019   Allergic rhinitis    Anxiety    Asthma    Depression    Headache(784.0)    Psoriatic arthritis ([facility])    Renal insufficiency    Stage 3 kidney disease.    Shingles     Surgical History: Past Surgical History:  Procedure Laterality Date   BREAST BIOPSY Left 09/30/2002   Ductogram-neg   INSERT / REPLACE / REMOVE PACEMAKER     Permanent pacemaker, Dr. [name]   Lobe hemithroidectomy     Right   TOTAL ABDOMINAL HYSTERECTOMY  2009   still has 1 ovary     Family History: Family History  Problem Relation Age of Onset   Heart disease Father        pacemaker,arrythmias   Lung disease Father    Asthma Other    Coronary artery disease Other        Female 1st degree relative <50   Lung cancer Other    Prostate cancer Other        1st degree relative <50   COPD Maternal Grandmother    Breast cancer Maternal Grandmother    Heart disease Maternal Grandfather    Liver disease Paternal Grandmother    Heart disease Paternal Grandfather     Social History:  reports that she has never smoked. She has never used smokeless tobacco. She reports that  she does not drink alcohol and does not use drugs.  Physical Exam: BP 124/83    Pulse 73    Ht 5\' 5"  (1.651 m)    Wt 200 lb (90.7 kg)    BMI 33.28 kg/m    Constitutional:  Alert and oriented, No acute distress. Cardiovascular: No clubbing, cyanosis, or edema. Respiratory: Normal respiratory effort, no increased work of breathing. GI: Abdomen is soft, nontender, nondistended, no abdominal masses   Pertinent Imaging: I have personally viewed and interpreted the CT dated 09/06/2021 showing a nonobstructive small 2 mm left lower pole stone, minimally changed from prior CT in 2019.  Assessment & Plan:   I discussed with the patient that her symptoms are secondary to UTI, possible left-sided pyelonephritis, but reassurance was provided regarding her small 2 mm nonobstructive kidney stone that has been stable since 2019.  I counseled her at length that nonobstructive stones, especially those in the lower pole, do not cause pain, or any change in renal function.  I recommended extending her course of Bactrim to 10 days for possible pyelonephritis, and trying MiraLAX over-the-counter for her constipation which is likely contributing to her pelvic pressure and discomfort.  Return precautions discussed at length.  Bactrim extended to 10-day course for UTI/possible left pyelonephritis Strategies regarding constipation discussed RTC  1 year KUB for surveillance of small stable left lower pole stone   Nickolas Madrid, MD 09/09/2021  [place] [address] [place], [place] [address] [phone]

## 2021-09-09 NOTE — Patient Instructions (Addendum)
Urinary Tract Infection, Adult A urinary tract infection (UTI) is an infection of any part of the urinary tract. The urinary tract includes the kidneys, ureters, bladder, and urethra. These organs make, store, and get rid of urine in the body. An upper UTI affects the ureters and kidneys. A lower UTI affects the bladder and urethra. What are the causes? Most urinary tract infections are caused by bacteria in your genital area around your urethra, where urine leaves your body. These bacteria grow and cause inflammation of your urinary tract. What increases the risk? You are more likely to develop this condition if: You have a urinary catheter that stays in place. You are not able to control when you urinate or have a bowel movement (incontinence). You are female and you: Use a spermicide or diaphragm for birth control. Have low estrogen levels. Are pregnant. You have certain genes that increase your risk. You are sexually active. You take antibiotic medicines. You have a condition that causes your flow of urine to slow down, such as: An enlarged prostate, if you are female. Blockage in your urethra. A kidney stone. A nerve condition that affects your bladder control (neurogenic bladder). Not getting enough to drink, or not urinating often. You have certain medical conditions, such as: Diabetes. A weak disease-fighting system (immunesystem). Sickle cell disease. Gout. Spinal cord injury. What are the signs or symptoms? Symptoms of this condition include: Needing to urinate right away (urgency). Frequent urination. This may include small amounts of urine each time you urinate. Pain or burning with urination. Blood in the urine. Urine that smells bad or unusual. Trouble urinating. Cloudy urine. Vaginal discharge, if you are female. Pain in the abdomen or the lower back. You may also have: Vomiting or a decreased appetite. Confusion. Irritability or tiredness. A fever or  chills. Diarrhea. The first symptom in older adults may be confusion. In some cases, they may not have any symptoms until the infection has worsened. How is this diagnosed? This condition is diagnosed based on your medical history and a physical exam. You may also have other tests, including: Urine tests. Blood tests. Tests for STIs (sexually transmitted infections). If you have had more than one UTI, a cystoscopy or imaging studies may be done to determine the cause of the infections. How is this treated? Treatment for this condition includes: Antibiotic medicine. Over-the-counter medicines to treat discomfort. Drinking enough water to stay hydrated. If you have frequent infections or have other conditions such as a kidney stone, you may need to see a health care provider who specializes in the urinary tract (urologist). In rare cases, urinary tract infections can cause sepsis. Sepsis is a life-threatening condition that occurs when the body responds to an infection. Sepsis is treated in the hospital with IV antibiotics, fluids, and other medicines. Follow these instructions at home: Medicines Take over-the-counter and prescription medicines only as told by your health care provider. If you were prescribed an antibiotic medicine, take it as told by your health care provider. Do not stop using the antibiotic even if you start to feel better. General instructions Make sure you: Empty your bladder often and completely. Do not hold urine for long periods of time. Empty your bladder after sex. Wipe from front to back after urinating or having a bowel movement if you are female. Use each tissue only one time when you wipe. Drink enough fluid to keep your urine pale yellow. Keep all follow-up visits. This is important. Contact a health care provider  if: Your symptoms do not get better after 1-2 days. Your symptoms go away and then return. Get help right away if: You have severe pain in your  back or your lower abdomen. You have a fever or chills. You have nausea or vomiting. Summary A urinary tract infection (UTI) is an infection of any part of the urinary tract, which includes the kidneys, ureters, bladder, and urethra. Most urinary tract infections are caused by bacteria in your genital area. Treatment for this condition often includes antibiotic medicines. If you were prescribed an antibiotic medicine, take it as told by your health care provider. Do not stop using the antibiotic even if you start to feel better. Keep all follow-up visits. This is important. This information is not intended to replace advice given to you by your health care provider. Make sure you discuss any questions you have with your health care provider. Document Revised: 04/20/2020 Document Reviewed: 04/20/2020 Elsevier Patient Education  2022 Elsevier Inc.  Constipation, Adult Constipation is when a person has fewer than three bowel movements in a week, has difficulty having a bowel movement, or has stools (feces) that are dry, hard, or larger than normal. Constipation may be caused by an underlying condition. It may become worse with age if a person takes certain medicines and does not take in enough fluids. Follow these instructions at home: Eating and drinking  Eat foods that have a lot of fiber, such as beans, whole grains, and fresh fruits and vegetables. Limit foods that are low in fiber and high in fat and processed sugars, such as fried or sweet foods. These include french fries, hamburgers, cookies, candies, and soda. Drink enough fluid to keep your urine pale yellow. General instructions Exercise regularly or as told by your health care provider. Try to do 150 minutes of moderate exercise each week. Use the bathroom when you have the urge to go. Do not hold it in. Take over-the-counter and prescription medicines only as told by your health care provider. This includes any fiber  supplements. During bowel movements: Practice deep breathing while relaxing the lower abdomen. Practice pelvic floor relaxation. Watch your condition for any changes. Let your health care provider know about them. Keep all follow-up visits as told by your health care provider. This is important. Contact a health care provider if: You have pain that gets worse. You have a fever. You do not have a bowel movement after 4 days. You vomit. You are not hungry or you lose weight. You are bleeding from the opening between the buttocks (anus). You have thin, pencil-like stools. Get help right away if: You have a fever and your symptoms suddenly get worse. You leak stool or have blood in your stool. Your abdomen is bloated. You have severe pain in your abdomen. You feel dizzy or you faint. Summary Constipation is when a person has fewer than three bowel movements in a week, has difficulty having a bowel movement, or has stools (feces) that are dry, hard, or larger than normal. Eat foods that have a lot of fiber, such as beans, whole grains, and fresh fruits and vegetables. Drink enough fluid to keep your urine pale yellow. Take over-the-counter and prescription medicines only as told by your health care provider. This includes any fiber supplements. This information is not intended to replace advice given to you by your health care provider. Make sure you discuss any questions you have with your health care provider. Document Revised: 07/27/2019 Document Reviewed: 07/27/2019 Elsevier Patient  Education  2022 [employer].

## 2021-09-10 ENCOUNTER — Ambulatory Visit: Payer: No Typology Code available for payment source | Admitting: Family Medicine

## 2021-09-10 NOTE — Progress Notes (Deleted)
° °  There were no vitals taken for this visit.   Subjective:    Patient ID: Emma Rogers, female    DOB: 11/16/1965, 55 y.o.   MRN: [id number]  HPI: Emma Rogers is a 55 y.o. female  No chief complaint on file.   Relevant past medical, surgical, family and social history reviewed and updated as indicated. Interim medical history since our last visit reviewed. Allergies and medications reviewed and updated.  Review of Systems  Per HPI unless specifically indicated above     Objective:    There were no vitals taken for this visit.  Wt Readings from Last 3 Encounters:  09/09/21 200 lb (90.7 kg)  09/05/21 206 lb 9.6 oz (93.7 kg)  08/26/21 205 lb (93 kg)    Physical Exam  Results for orders placed or performed during the hospital encounter of 09/04/21  Urine Culture   Specimen: Urine, Clean Catch  Result Value Ref Range   Specimen Description URINE, CLEAN CATCH    Special Requests      NONE Performed at [facility], [address], [place], [place] [address]    Culture >=100,000 COLONIES/mL ENTEROBACTER AEROGENES (A)    Report Status 09/06/2021 FINAL    Organism ID, Bacteria ENTEROBACTER AEROGENES (A)       Susceptibility   Enterobacter aerogenes - MIC*    CEFAZOLIN RESISTANT Resistant     CEFEPIME <=0.12 SENSITIVE Sensitive     CEFTRIAXONE <=0.25 SENSITIVE Sensitive     CIPROFLOXACIN <=0.25 SENSITIVE Sensitive     GENTAMICIN <=1 SENSITIVE Sensitive     IMIPENEM 2 SENSITIVE Sensitive     NITROFURANTOIN 32 SENSITIVE Sensitive     TRIMETH/SULFA <=20 SENSITIVE Sensitive     PIP/TAZO <=4 SENSITIVE Sensitive     * >=100,000 COLONIES/mL ENTEROBACTER AEROGENES  POCT urinalysis dipstick  Result Value Ref Range   Color, UA brown (A) yellow   Clarity, UA cloudy (A) clear   Glucose, UA negative negative mg/dL   Bilirubin, UA negative negative   Ketones, POC UA trace (5) (A) negative mg/dL   Spec Grav, UA >=1.696 (A) 1.010 - 1.025   Blood, UA large (A) negative    pH, UA 5.5 5.0 - 8.0   Protein Ur, POC >=300 (A) negative mg/dL   Urobilinogen, UA 0.2 0.2 or 1.0 E.U./dL   Nitrite, UA Positive (A) Negative   Leukocytes, UA Small (1+) (A) Negative      Assessment & Plan:   Problem List Items Addressed This Visit   None Visit Diagnoses     Pyelonephritis    -  Primary        Follow up plan: No follow-ups on file.

## 2021-09-20 ENCOUNTER — Encounter: Payer: Self-pay | Admitting: Family Medicine

## 2021-09-20 ENCOUNTER — Ambulatory Visit (INDEPENDENT_AMBULATORY_CARE_PROVIDER_SITE_OTHER): Payer: No Typology Code available for payment source | Admitting: Family Medicine

## 2021-09-20 ENCOUNTER — Other Ambulatory Visit: Payer: Self-pay

## 2021-09-20 VITALS — BP 109/73 | HR 71 | Temp 98.1°F | Wt 207.0 lb

## 2021-09-20 DIAGNOSIS — K5900 Constipation, unspecified: Secondary | ICD-10-CM | POA: Diagnosis not present

## 2021-09-20 DIAGNOSIS — N12 Tubulo-interstitial nephritis, not specified as acute or chronic: Secondary | ICD-10-CM

## 2021-09-20 DIAGNOSIS — E538 Deficiency of other specified B group vitamins: Secondary | ICD-10-CM

## 2021-09-20 LAB — URINALYSIS, ROUTINE W REFLEX MICROSCOPIC
Bilirubin, UA: NEGATIVE
Glucose, UA: NEGATIVE
Ketones, UA: NEGATIVE
Leukocytes,UA: NEGATIVE
Nitrite, UA: NEGATIVE
Protein,UA: NEGATIVE
RBC, UA: NEGATIVE
Specific Gravity, UA: 1.01 (ref 1.005–1.030)
Urobilinogen, Ur: 0.2 mg/dL (ref 0.2–1.0)
pH, UA: 6 (ref 5.0–7.5)

## 2021-09-20 NOTE — Progress Notes (Signed)
BP 109/73    Pulse 71    Temp 98.1 F (36.7 C)    Wt 207 lb (93.9 kg)    SpO2 97%    BMI 34.45 kg/m    Subjective:    Patient ID: Emma Rogers, female    DOB: 09/17/1966, 55 y.o.   MRN: [id number]  HPI: Emma Rogers is a 55 y.o. female  No chief complaint on file.  URINARY SYMPTOMS Duration:  Dysuria: no Urinary frequency: yes Urgency: no Small volume voids: no Symptom severity: mild Urinary incontinence: no Foul odor: no Hematuria: no Abdominal pain: no Back pain: no Suprapubic pain/pressure: no Flank pain: no Fever:  no Vomiting: no Relief with cranberry juice: no Relief with pyridium: no Status: better Previous urinary tract infection: yes Recurrent urinary tract infection: no Sexual activity: monogomous History of sexually transmitted disease: no Vaginal discharge: no Treatments attempted: antibiotics and increasing fluids   Relevant past medical, surgical, family and social history reviewed and updated as indicated. Interim medical history since our last visit reviewed. Allergies and medications reviewed and updated.  Review of Systems  Constitutional: Negative.   Respiratory: Negative.    Cardiovascular: Negative.   Gastrointestinal:  Positive for constipation. Negative for abdominal distention, abdominal pain, anal bleeding, blood in stool, diarrhea, nausea, rectal pain and vomiting.  Genitourinary: Negative.   Musculoskeletal: Negative.   Psychiatric/Behavioral: Negative.     Per HPI unless specifically indicated above     Objective:    BP 109/73    Pulse 71    Temp 98.1 F (36.7 C)    Wt 207 lb (93.9 kg)    SpO2 97%    BMI 34.45 kg/m   Wt Readings from Last 3 Encounters:  09/20/21 207 lb (93.9 kg)  09/09/21 200 lb (90.7 kg)  09/05/21 206 lb 9.6 oz (93.7 kg)    Physical Exam Vitals and nursing note reviewed.  Constitutional:      General: She is not in acute distress.    Appearance: Normal appearance. She is not ill-appearing,  toxic-appearing or diaphoretic.  HENT:     Head: Normocephalic and atraumatic.     Right Ear: External ear normal.     Left Ear: External ear normal.     Nose: Nose normal.     Mouth/Throat:     Mouth: Mucous membranes are moist.     Pharynx: Oropharynx is clear.  Eyes:     General: No scleral icterus.       Right eye: No discharge.        Left eye: No discharge.     Extraocular Movements: Extraocular movements intact.     Conjunctiva/sclera: Conjunctivae normal.     Pupils: Pupils are equal, round, and reactive to light.  Cardiovascular:     Rate and Rhythm: Normal rate and regular rhythm.     Pulses: Normal pulses.     Heart sounds: Normal heart sounds. No murmur heard.   No friction rub. No gallop.  Pulmonary:     Effort: Pulmonary effort is normal. No respiratory distress.     Breath sounds: Normal breath sounds. No stridor. No wheezing, rhonchi or rales.  Chest:     Chest wall: No tenderness.  Musculoskeletal:        General: Normal range of motion.     Cervical back: Normal range of motion and neck supple.  Skin:    General: Skin is warm and dry.     Capillary Refill: Capillary refill takes less than  2 seconds.     Coloration: Skin is not jaundiced or pale.     Findings: No bruising, erythema, lesion or rash.  Neurological:     General: No focal deficit present.     Mental Status: She is alert and oriented to person, place, and time. Mental status is at baseline.  Psychiatric:        Mood and Affect: Mood normal.        Behavior: Behavior normal.        Thought Content: Thought content normal.        Judgment: Judgment normal.    Results for orders placed or performed during the hospital encounter of 09/04/21  Urine Culture   Specimen: Urine, Clean Catch  Result Value Ref Range   Specimen Description URINE, CLEAN CATCH    Special Requests      NONE Performed at [facility], [address] [place], [place] [address]    Culture >=100,000 COLONIES/mL  ENTEROBACTER AEROGENES (A)    Report Status 09/06/2021 FINAL    Organism ID, Bacteria ENTEROBACTER AEROGENES (A)       Susceptibility   Enterobacter aerogenes - MIC*    CEFAZOLIN RESISTANT Resistant     CEFEPIME <=0.12 SENSITIVE Sensitive     CEFTRIAXONE <=0.25 SENSITIVE Sensitive     CIPROFLOXACIN <=0.25 SENSITIVE Sensitive     GENTAMICIN <=1 SENSITIVE Sensitive     IMIPENEM 2 SENSITIVE Sensitive     NITROFURANTOIN 32 SENSITIVE Sensitive     TRIMETH/SULFA <=20 SENSITIVE Sensitive     PIP/TAZO <=4 SENSITIVE Sensitive     * >=100,000 COLONIES/mL ENTEROBACTER AEROGENES  POCT urinalysis dipstick  Result Value Ref Range   Color, UA brown (A) yellow   Clarity, UA cloudy (A) clear   Glucose, UA negative negative mg/dL   Bilirubin, UA negative negative   Ketones, POC UA trace (5) (A) negative mg/dL   Spec Grav, UA >=2.229 (A) 1.010 - 1.025   Blood, UA large (A) negative   pH, UA 5.5 5.0 - 8.0   Protein Ur, POC >=300 (A) negative mg/dL   Urobilinogen, UA 0.2 0.2 or 1.0 E.U./dL   Nitrite, UA Positive (A) Negative   Leukocytes, UA Small (1+) (A) Negative      Assessment & Plan:   Problem List Items Addressed This Visit   None Visit Diagnoses     Pyelonephritis    -  Primary   UA clear. Symptoms resolved. Continue to monitor. Call with any concerns.    Relevant Orders   Urinalysis, Routine w reflex microscopic   Constipation, unspecified constipation type       Recommended TID miralax. Can use mag citrate if not improving. Continue to monitor. Call with any concerns.        Follow up plan: Return April for 6 month physical.

## 2021-09-24 ENCOUNTER — Other Ambulatory Visit: Payer: Self-pay

## 2021-09-24 ENCOUNTER — Encounter: Payer: Self-pay | Admitting: Dermatology

## 2021-09-24 ENCOUNTER — Ambulatory Visit: Payer: No Typology Code available for payment source | Admitting: Dermatology

## 2021-09-24 DIAGNOSIS — B029 Zoster without complications: Secondary | ICD-10-CM | POA: Diagnosis not present

## 2021-09-24 MED ORDER — VALACYCLOVIR HCL 1 G PO TABS
ORAL_TABLET | ORAL | 0 refills | Status: DC
  Filled 2021-09-24: qty 21, 7d supply, fill #0

## 2021-09-24 NOTE — Patient Instructions (Signed)

## 2021-09-24 NOTE — Progress Notes (Signed)
° °  Follow-Up Visit   Subjective  Emma Rogers is a 56 y.o. female who presents for the following: Rash (Pt c/o painful area on her neck started 4 days ago, rash appeared in this same area yesterday ).  +Chronic kidney diease   The following portions of the chart were reviewed this encounter and updated as appropriate:   Tobacco   Allergies   Meds   Problems   Med Hx   Surg Hx   Fam Hx       Review of Systems:  No other skin or systemic complaints except as noted in HPI or Assessment and Plan.  Objective  Well appearing patient in no apparent distress; mood and affect are within normal limits.  A focused examination was performed including posterior neck. Relevant physical exam findings are noted in the Assessment and Plan.  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 Height 5'5" Weight 93.9 kg Start Valacyclovir 1 gm 3 times a day x 7 days    Related Procedures HSV and VZV PCR Panel CMP  Related Medications valACYclovir (VALTREX) 1000 MG tablet Take 1 tablet po 3 times a day x 7 days   Return if symptoms worsen or fail to improve.  I, Angelique Holm, CMA, am acting as scribe for Darden Dates, MD .   Documentation: I have reviewed the above documentation for accuracy and completeness, and I agree with the above.  Darden Dates, MD

## 2021-09-25 LAB — COMPREHENSIVE METABOLIC PANEL
ALT: 13 IU/L (ref 0–32)
AST: 18 IU/L (ref 0–40)
Albumin/Globulin Ratio: 1.8 (ref 1.2–2.2)
Albumin: 4.8 g/dL (ref 3.8–4.9)
Alkaline Phosphatase: 59 IU/L (ref 44–121)
BUN/Creatinine Ratio: 12 (ref 9–23)
BUN: 13 mg/dL (ref 6–24)
Bilirubin Total: 0.5 mg/dL (ref 0.0–1.2)
CO2: 25 mmol/L (ref 20–29)
Calcium: 10 mg/dL (ref 8.7–10.2)
Chloride: 98 mmol/L (ref 96–106)
Creatinine, Ser: 1.12 mg/dL — ABNORMAL HIGH (ref 0.57–1.00)
Globulin, Total: 2.6 g/dL (ref 1.5–4.5)
Glucose: 85 mg/dL (ref 70–99)
Potassium: 4.6 mmol/L (ref 3.5–5.2)
Sodium: 138 mmol/L (ref 134–144)
Total Protein: 7.4 g/dL (ref 6.0–8.5)
eGFR: 58 mL/min/{1.73_m2} — ABNORMAL LOW (ref >59)

## 2021-09-26 LAB — HSV AND VZV PCR PANEL
HSV 2 DNA: NEGATIVE
HSV-1 DNA: NEGATIVE
Varicella-Zoster, PCR: POSITIVE — AB

## 2021-09-26 LAB — SPECIMEN STATUS REPORT

## 2021-09-27 ENCOUNTER — Ambulatory Visit: Payer: No Typology Code available for payment source | Admitting: Family Medicine

## 2021-10-02 ENCOUNTER — Encounter: Payer: Self-pay | Admitting: Dermatology

## 2021-10-02 ENCOUNTER — Ambulatory Visit (INDEPENDENT_AMBULATORY_CARE_PROVIDER_SITE_OTHER): Payer: No Typology Code available for payment source | Admitting: Dermatology

## 2021-10-02 ENCOUNTER — Other Ambulatory Visit: Payer: Self-pay

## 2021-10-02 DIAGNOSIS — L409 Psoriasis, unspecified: Secondary | ICD-10-CM | POA: Diagnosis not present

## 2021-10-02 DIAGNOSIS — B029 Zoster without complications: Secondary | ICD-10-CM | POA: Diagnosis not present

## 2021-10-02 NOTE — Patient Instructions (Addendum)
Start over the counter topical lidocaine to help with pain at back of neck   If You Need Anything After Your Visit  If you have any questions or concerns for your doctor, please call our main line at [phone] and press option 4 to reach your doctor's medical assistant. If no one answers, please leave a voicemail as directed and we will return your call as soon as possible. Messages left after 4 pm will be answered the following business day.   You may also send [place] a message via [place]. We typically respond to MyChart messages within 1-2 business days.  For prescription refills, please ask your pharmacy to contact our office. Our fax number is [phone].  If you have an urgent issue when the clinic is closed that cannot wait until the next business day, you can page your doctor at the number below.    Please note that while we do our best to be available for urgent issues outside of office hours, we are not available 24/7.   If you have an urgent issue and are unable to reach [place], you may choose to seek medical care at your doctor's office, retail clinic, urgent care center, or emergency room.  If you have a medical emergency, please immediately call 911 or go to the emergency department.  Pager Numbers  - Dr. Nehemiah Massed: [phone]  - Dr. Laurence Ferrari: [phone]  - Dr. Nicole Kindred: [phone]  In the event of inclement weather, please call our main line at [phone] for an update on the status of any delays or closures.  Dermatology Medication Tips: Please keep the boxes that topical medications come in in order to help keep track of the instructions about where and how to use these. Pharmacies typically print the medication instructions only on the boxes and not directly on the medication tubes.   If your medication is too expensive, please contact our office at [phone] option 4 or send [place] a message through [place].   We are unable to tell what your co-pay for medications  will be in advance as this is different depending on your insurance coverage. However, we may be able to find a substitute medication at lower cost or fill out paperwork to get insurance to cover a needed medication.   If a prior authorization is required to get your medication covered by your insurance company, please allow [place] 1-2 business days to complete this process.  Drug prices often vary depending on where the prescription is filled and some pharmacies may offer cheaper prices.  The website www.goodrx.com contains coupons for medications through different pharmacies. The prices here do not account for what the cost may be with help from insurance (it may be cheaper with your insurance), but the website can give you the price if you did not use any insurance.  - You can print the associated coupon and take it with your prescription to the pharmacy.  - You may also stop by our office during regular business hours and pick up a GoodRx coupon card.  - If you need your prescription sent electronically to a different pharmacy, notify our office through [facility] or by phone at [phone] option 4.     Si Usted Necesita Algo Despus de Su Visita  Tambin puede enviarnos un mensaje a travs de Pharmacist, community. Por lo general respondemos a los mensajes de MyChart en el transcurso de 1 a 2 das hbiles.  Para renovar recetas, por favor pida a su farmacia que se  ponga en contacto con nuestra oficina. Harland Dingwall de fax es [place] [phone].  Si tiene un asunto urgente cuando la clnica est cerrada y que no puede esperar hasta el siguiente da hbil, puede llamar/localizar a su doctor(a) al nmero que aparece a continuacin.   Por favor, tenga en cuenta que aunque hacemos todo lo posible para estar disponibles para asuntos urgentes fuera del horario de [place], no estamos disponibles las 24 horas del da, los 7 das de la [place].   Si tiene un problema urgente y no puede comunicarse con  nosotros, puede optar por buscar atencin mdica  en el consultorio de su doctor(a), en una clnica privada, en un centro de atencin urgente o en una sala de emergencias.  Si tiene Engineering geologist, por favor llame inmediatamente al 911 o vaya a la sala de emergencias.  Nmeros de bper  - Dr. Nehemiah Massed: [phone]  - Dra. Moye: [phone]  - Dra. Nicole Kindred: [phone]  En caso de inclemencias del [place], por favor llame a Johnsie Kindred principal al [phone] para una actualizacin sobre el [place] de cualquier retraso o cierre.  Consejos para la medicacin en dermatologa: Por favor, guarde las cajas en las que vienen los medicamentos de uso tpico para ayudarle a seguir las instrucciones sobre dnde y cmo usarlos. Las farmacias generalmente imprimen las instrucciones del medicamento slo en las cajas y no directamente en los tubos del [place].   Si su medicamento es muy caro, por favor, pngase en contacto con Zigmund Daniel llamando al [phone] y presione la opcin 4 o envenos un mensaje a travs de Pharmacist, community.   No podemos decirle cul ser su copago por los medicamentos por adelantado ya que esto es diferente dependiendo de la cobertura de su seguro. Sin embargo, es posible que podamos encontrar un medicamento sustituto a Electrical engineer un formulario para que el seguro cubra el medicamento que se considera necesario.   Si se requiere una autorizacin previa para que su compaa de seguros [place] su medicamento, por favor permtanos de 1 a 2 das hbiles para completar este proceso.  Los precios de los medicamentos varan con frecuencia dependiendo del Environmental consultant de dnde se surte la receta y alguna farmacias pueden ofrecer precios ms baratos.  El sitio web www.goodrx.com tiene cupones para medicamentos de Airline pilot. Los precios aqu no tienen en cuenta lo que podra costar con la ayuda del seguro (puede ser ms barato con su seguro), pero el sitio web  puede darle el precio si no utiliz Research scientist (physical sciences).  - Puede imprimir el cupn correspondiente y llevarlo con su receta a la farmacia.  - Tambin puede pasar por nuestra oficina durante el horario de atencin regular y Charity fundraiser una tarjeta de cupones de GoodRx.  - Si necesita que su receta se enve electrnicamente a una farmacia diferente, informe a nuestra oficina a travs de MyChart de [place] o por telfono llamando al [phone] y presione la opcin 4.

## 2021-10-02 NOTE — Progress Notes (Signed)
° °  Follow-Up Visit   Subjective  Emma Rogers is a 56 y.o. female who presents for the following: Follow-up (Patient here today for 3 month follow up on psoriasis. Currently on stelara since November. Patient reports has seen much improvement ).    The following portions of the chart were reviewed this encounter and updated as appropriate:  Tobacco   Allergies   Meds   Problems   Med Hx   Surg Hx   Fam Hx       Review of Systems: No other skin or systemic complaints except as noted in HPI or Assessment and Plan.   Objective  Well appearing patient in no apparent distress; mood and affect are within normal limits.  A focused examination was performed including arms, legs, neck. Relevant physical exam findings are noted in the Assessment and Plan.  arms legs thighs hypopigmented patches and erythematous patches at legs, arms   Neck - Posterior Erythematous papules clustered at right posterior neck   Assessment & Plan  Psoriasis arms legs thighs  Chronic condition with duration or expected duration over one year. Skin currently well-controlled.  Patient with some persistent joint pain at back and knees.   Psoriasis is a chronic non-curable, but treatable genetic/hereditary disease that may have other systemic features affecting other organ systems such as joints (Psoriatic Arthritis). It is associated with an increased risk of inflammatory bowel disease, heart disease, non-alcoholic fatty liver disease, and depression.    Follow up with rheumatology in February with Dr. Allena Katz to see how joints are doing. Could consider higher dose of Stelara if joints not well-controlled.  Continue Stelara 45 mg as directed  Related Medications Calcipotriene-Betameth Diprop (WYNZORA) 0.005-0.064 % CREA Apply 1 application topically daily. Avoid applying to face, groin, and axilla. Use as directed. Risk of skin atrophy with long-term use reviewed.  Herpes zoster without complication Neck -  Posterior  Continue Valacyclovir 1000 mg tablets until finished  For pain, can use over the counter topical lidocaine as directed  If persistent post-herpetic neuralgia or worsening neuralgia, could consider gabapentin but deferred today  Related Medications valACYclovir (VALTREX) 1000 MG tablet Take 1 tablet po 3 times a day x 7 days   Return for 6 - 9 month psoriasis follow up. I, Asher Muir, CMA, am acting as scribe for Darden Dates, MD.  Documentation: I have reviewed the above documentation for accuracy and completeness, and I agree with the above.  Darden Dates, MD

## 2021-10-15 ENCOUNTER — Other Ambulatory Visit (HOSPITAL_COMMUNITY): Payer: Self-pay

## 2021-10-18 ENCOUNTER — Other Ambulatory Visit: Payer: Self-pay | Admitting: Family Medicine

## 2021-10-18 DIAGNOSIS — Z1231 Encounter for screening mammogram for malignant neoplasm of breast: Secondary | ICD-10-CM

## 2021-10-22 ENCOUNTER — Other Ambulatory Visit: Payer: Self-pay

## 2021-10-24 DIAGNOSIS — Z1231 Encounter for screening mammogram for malignant neoplasm of breast: Secondary | ICD-10-CM

## 2021-10-28 ENCOUNTER — Other Ambulatory Visit: Payer: Self-pay

## 2021-10-28 ENCOUNTER — Encounter: Payer: Self-pay | Admitting: Family Medicine

## 2021-10-28 ENCOUNTER — Other Ambulatory Visit (HOSPITAL_COMMUNITY): Payer: Self-pay

## 2021-10-28 ENCOUNTER — Other Ambulatory Visit: Payer: Self-pay | Admitting: Family Medicine

## 2021-10-28 DIAGNOSIS — G4701 Insomnia due to medical condition: Secondary | ICD-10-CM

## 2021-10-28 MED ORDER — TRAZODONE HCL 100 MG PO TABS
100.0000 mg | ORAL_TABLET | Freq: Every evening | ORAL | 1 refills | Status: DC | PRN
Start: 2021-10-28 — End: 2022-03-13
  Filled 2021-10-28: qty 135, 90d supply, fill #0
  Filled 2022-02-05: qty 135, 90d supply, fill #1

## 2021-10-29 ENCOUNTER — Other Ambulatory Visit (HOSPITAL_COMMUNITY): Payer: Self-pay

## 2021-10-29 ENCOUNTER — Other Ambulatory Visit: Payer: Self-pay

## 2021-10-29 ENCOUNTER — Encounter: Payer: Self-pay | Admitting: Family Medicine

## 2021-10-29 ENCOUNTER — Ambulatory Visit (INDEPENDENT_AMBULATORY_CARE_PROVIDER_SITE_OTHER): Payer: No Typology Code available for payment source | Admitting: Family Medicine

## 2021-10-29 VITALS — BP 106/73 | HR 83 | Temp 98.3°F | Wt 197.0 lb

## 2021-10-29 DIAGNOSIS — R197 Diarrhea, unspecified: Secondary | ICD-10-CM | POA: Diagnosis not present

## 2021-10-29 DIAGNOSIS — K219 Gastro-esophageal reflux disease without esophagitis: Secondary | ICD-10-CM

## 2021-10-29 MED ORDER — OMEPRAZOLE 20 MG PO CPDR
20.0000 mg | DELAYED_RELEASE_CAPSULE | Freq: Every day | ORAL | 3 refills | Status: DC
  Filled 2021-10-29: qty 30, 30d supply, fill #0
  Filled 2022-02-18: qty 30, 30d supply, fill #1
  Filled 2022-08-29: qty 30, 30d supply, fill #2

## 2021-10-29 NOTE — Progress Notes (Signed)
BP 106/73    Pulse 83    Temp 98.3 F (36.8 C) (Oral)    Wt 197 lb (89.4 kg)    SpO2 95%    BMI 32.78 kg/m    Subjective:    Patient ID: Emma Rogers, female    DOB: 02-01-1966, 56 y.o.   MRN: [id number]  HPI: Cathy Crounse is a 56 y.o. female  Chief Complaint  Patient presents with   GI Problem    Pt states she has been having issues with cramping and GERD since December. States she burps a lot and cannot eat certain foods anymore. States she has also been having issues with BM, sometimes she is constipated and sometimes she has diarrhea.    ABDOMINAL ISSUES Duration: 2-3 months Nature: belching, acid, gas, gassy epigastric pain Location: epigastric  Severity: moderate to severe Radiation: no Episode duration:couple of hours, coming and going Frequency: intermittent Alleviating factors: nothing Aggravating factors: eating Treatments attempted: rolaids, tums Constipation: intermittent Diarrhea: yes Episodes of diarrhea/day: up to 5-6x a day Mucous in the stool: no Heartburn: yes Bloating:yes Flatulence: yes Nausea: yes Vomiting: no Melena or hematochezia: no Rash: no Jaundice: no Fever: no Weight loss: yes  Relevant past medical, surgical, family and social history reviewed and updated as indicated. Interim medical history since our last visit reviewed. Allergies and medications reviewed and updated.  Review of Systems  Constitutional: Negative.   Respiratory: Negative.    Cardiovascular: Negative.   Gastrointestinal:  Positive for diarrhea and nausea. Negative for abdominal distention, abdominal pain, anal bleeding, blood in stool, constipation, rectal pain and vomiting.  Musculoskeletal: Negative.   Psychiatric/Behavioral: Negative.     Per HPI unless specifically indicated above     Objective:    BP 106/73    Pulse 83    Temp 98.3 F (36.8 C) (Oral)    Wt 197 lb (89.4 kg)    SpO2 95%    BMI 32.78 kg/m   Wt Readings from Last 3 Encounters:  10/29/21  197 lb (89.4 kg)  09/20/21 207 lb (93.9 kg)  09/09/21 200 lb (90.7 kg)    Physical Exam Vitals and nursing note reviewed.  Constitutional:      General: She is not in acute distress.    Appearance: Normal appearance. She is not ill-appearing, toxic-appearing or diaphoretic.  HENT:     Head: Normocephalic and atraumatic.     Right Ear: External ear normal.     Left Ear: External ear normal.     Nose: Nose normal.     Mouth/Throat:     Mouth: Mucous membranes are moist.     Pharynx: Oropharynx is clear.  Eyes:     General: No scleral icterus.       Right eye: No discharge.        Left eye: No discharge.     Extraocular Movements: Extraocular movements intact.     Conjunctiva/sclera: Conjunctivae normal.     Pupils: Pupils are equal, round, and reactive to light.  Cardiovascular:     Rate and Rhythm: Normal rate and regular rhythm.     Pulses: Normal pulses.     Heart sounds: Normal heart sounds. No murmur heard.   No friction rub. No gallop.  Pulmonary:     Effort: Pulmonary effort is normal. No respiratory distress.     Breath sounds: Normal breath sounds. No stridor. No wheezing, rhonchi or rales.  Chest:     Chest wall: No tenderness.  Musculoskeletal:  General: Normal range of motion.     Cervical back: Normal range of motion and neck supple.  Skin:    General: Skin is warm and dry.     Capillary Refill: Capillary refill takes less than 2 seconds.     Coloration: Skin is not jaundiced or pale.     Findings: No bruising, erythema, lesion or rash.  Neurological:     General: No focal deficit present.     Mental Status: She is alert and oriented to person, place, and time. Mental status is at baseline.  Psychiatric:        Mood and Affect: Mood normal.        Behavior: Behavior normal.        Thought Content: Thought content normal.        Judgment: Judgment normal.    Results for orders placed or performed in visit on 09/24/21  HSV and VZV PCR Panel  Result  Value Ref Range   Varicella-Zoster, PCR Positive (A) Negative   HSV-1 DNA Negative Negative   HSV 2 DNA Negative Negative  CMP  Result Value Ref Range   Glucose 85 70 - 99 mg/dL   BUN 13 6 - 24 mg/dL   Creatinine, Ser 1.12 (H) 0.57 - 1.00 mg/dL   eGFR 58 (L) >59 mL/min/1.73   BUN/Creatinine Ratio 12 9 - 23   Sodium 138 134 - 144 mmol/L   Potassium 4.6 3.5 - 5.2 mmol/L   Chloride 98 96 - 106 mmol/L   CO2 25 20 - 29 mmol/L   Calcium 10.0 8.7 - 10.2 mg/dL   Total Protein 7.4 6.0 - 8.5 g/dL   Albumin 4.8 3.8 - 4.9 g/dL   Globulin, Total 2.6 1.5 - 4.5 g/dL   Albumin/Globulin Ratio 1.8 1.2 - 2.2   Bilirubin Total 0.5 0.0 - 1.2 mg/dL   Alkaline Phosphatase 59 44 - 121 IU/L   AST 18 0 - 40 IU/L   ALT 13 0 - 32 IU/L  Specimen status report  Result Value Ref Range   specimen status report Comment       Assessment & Plan:   Problem List Items Addressed This Visit   None Visit Diagnoses     Gastroesophageal reflux disease without esophagitis    -  Primary   Likely exacerbated due to her saxenda. Will cut down her dose to 2.4 or 1.46m daily and add omeprazole. Checking labs. Recheck 1 month. Call with any concerns.    Relevant Medications   omeprazole (PRILOSEC) 20 MG capsule   Other Relevant Orders   Comprehensive metabolic panel   CBC with Differential/Platelet   Diarrhea, unspecified type       Likely exacerbated due to her saxenda. Will cut down her dose to 2.4 or 1.869mdaily and add omeprazole. Checking labs. Recheck 1 month. Call with any concerns.    Relevant Orders   Comprehensive metabolic panel   CBC with Differential/Platelet   TSH        Follow up plan: Return in about 4 weeks (around 11/26/2021).

## 2021-10-30 LAB — COMPREHENSIVE METABOLIC PANEL
ALT: 13 IU/L (ref 0–32)
AST: 15 IU/L (ref 0–40)
Albumin/Globulin Ratio: 2 (ref 1.2–2.2)
Albumin: 4.7 g/dL (ref 3.8–4.9)
Alkaline Phosphatase: 61 IU/L (ref 44–121)
BUN/Creatinine Ratio: 11 (ref 9–23)
BUN: 16 mg/dL (ref 6–24)
Bilirubin Total: 0.4 mg/dL (ref 0.0–1.2)
CO2: 22 mmol/L (ref 20–29)
Calcium: 9.3 mg/dL (ref 8.7–10.2)
Chloride: 97 mmol/L (ref 96–106)
Creatinine, Ser: 1.45 mg/dL — ABNORMAL HIGH (ref 0.57–1.00)
Globulin, Total: 2.3 g/dL (ref 1.5–4.5)
Glucose: 106 mg/dL — ABNORMAL HIGH (ref 70–99)
Potassium: 3.8 mmol/L (ref 3.5–5.2)
Sodium: 139 mmol/L (ref 134–144)
Total Protein: 7 g/dL (ref 6.0–8.5)
eGFR: 43 mL/min/{1.73_m2} — ABNORMAL LOW (ref >59)

## 2021-10-30 LAB — CBC WITH DIFFERENTIAL/PLATELET
Basophils Absolute: 0.1 10*3/uL (ref 0.0–0.2)
Basos: 1 %
EOS (ABSOLUTE): 0.2 10*3/uL (ref 0.0–0.4)
Eos: 2 %
Hematocrit: 43.5 % (ref 34.0–46.6)
Hemoglobin: 14.8 g/dL (ref 11.1–15.9)
Immature Grans (Abs): 0 10*3/uL (ref 0.0–0.1)
Immature Granulocytes: 0 %
Lymphocytes Absolute: 2.1 10*3/uL (ref 0.7–3.1)
Lymphs: 24 %
MCH: 31.4 pg (ref 26.6–33.0)
MCHC: 34 g/dL (ref 31.5–35.7)
MCV: 92 fL (ref 79–97)
Monocytes Absolute: 0.4 10*3/uL (ref 0.1–0.9)
Monocytes: 5 %
Neutrophils Absolute: 6.1 10*3/uL (ref 1.4–7.0)
Neutrophils: 68 %
Platelets: 308 10*3/uL (ref 150–450)
RBC: 4.71 x10E6/uL (ref 3.77–5.28)
RDW: 13 % (ref 11.7–15.4)
WBC: 8.9 10*3/uL (ref 3.4–10.8)

## 2021-10-30 LAB — TSH: TSH: 0.5 u[IU]/mL (ref 0.450–4.500)

## 2021-11-05 ENCOUNTER — Other Ambulatory Visit (HOSPITAL_COMMUNITY): Payer: Self-pay

## 2021-11-05 ENCOUNTER — Ambulatory Visit: Payer: No Typology Code available for payment source | Admitting: Family Medicine

## 2021-11-11 ENCOUNTER — Other Ambulatory Visit (HOSPITAL_COMMUNITY): Payer: Self-pay

## 2021-11-26 ENCOUNTER — Encounter: Payer: Self-pay | Admitting: Family Medicine

## 2021-11-26 ENCOUNTER — Ambulatory Visit (INDEPENDENT_AMBULATORY_CARE_PROVIDER_SITE_OTHER): Payer: No Typology Code available for payment source | Admitting: Family Medicine

## 2021-11-26 ENCOUNTER — Other Ambulatory Visit: Payer: Self-pay

## 2021-11-26 VITALS — BP 100/66 | HR 78 | Temp 98.6°F | Wt 195.8 lb

## 2021-11-26 DIAGNOSIS — R197 Diarrhea, unspecified: Secondary | ICD-10-CM | POA: Diagnosis not present

## 2021-11-26 DIAGNOSIS — K219 Gastro-esophageal reflux disease without esophagitis: Secondary | ICD-10-CM

## 2021-11-26 DIAGNOSIS — Z6832 Body mass index (BMI) 32.0-32.9, adult: Secondary | ICD-10-CM

## 2021-11-26 DIAGNOSIS — E6609 Other obesity due to excess calories: Secondary | ICD-10-CM | POA: Diagnosis not present

## 2021-11-26 DIAGNOSIS — H6121 Impacted cerumen, right ear: Secondary | ICD-10-CM | POA: Diagnosis not present

## 2021-11-26 NOTE — Assessment & Plan Note (Signed)
Improved. Doing well on the 2.4mg  of saxenda. Continue to monitor. Call with any concerns.  ?

## 2021-11-26 NOTE — Progress Notes (Signed)
? ?BP 100/66   Pulse 78   Temp 98.6 ?F (37 ?C)   Wt 195 lb 12.8 oz (88.8 kg)   SpO2 97%   BMI 32.58 kg/m?   ? ?Subjective:  ? ? Patient ID: Emma Rogers, female    DOB: April 14, 1966, 56 y.o.   MRN: [id number] ? ?HPI: ?Emma Rogers is a 56 y.o. female ? ?Chief Complaint  ?Patient presents with  ? Gastroesophageal Reflux  ? Diarrhea  ? Ear Pain  ?  Patient states her right ear is hurting and has been popping. Ear has been hurting off and on for a few months.   ? ?Diarrhea is gone. Had been constipated- doing better with taking a fiber 1 bar.  ? ?OBESITY ?Duration: chronic ?Previous attempts at weight loss: yes ?Complications of obesity: depression, HLD ?Peak weight: 213 ?Weight loss goal: to be healthy  ?Weight loss to date: 18lbs (22lbs at home) ?Requesting obesity pharmacotherapy: yes ?Current weight loss supplements/medications: yes ?Previous weight loss supplements/meds: no ? ?EAG CLOGGED ?Duration: couple of months off and on ?Involved ear(s): "right ?Sensation of feeling clogged/plugged: yes ?Decreased/muffled hearing:yes ?Ear pain: yes ?Fever: no ?Otorrhea: no ?Hearing loss: yes ?Upper respiratory infection symptoms: no ?Using Q-Tips: no ?Status: worse ?History of cerumenosis: no ?Treatments attempted: none ? ?Relevant past medical, surgical, family and social history reviewed and updated as indicated. Interim medical history since our last visit reviewed. ?Allergies and medications reviewed and updated. ? ?Review of Systems  ?Constitutional: Negative.   ?HENT: Negative.    ?Respiratory: Negative.    ?Cardiovascular: Negative.   ?Gastrointestinal: Negative.   ?Musculoskeletal: Negative.   ?Psychiatric/Behavioral: Negative.    ? ?Per HPI unless specifically indicated above ? ?   ?Objective:  ?  ?BP 100/66   Pulse 78   Temp 98.6 ?F (37 ?C)   Wt 195 lb 12.8 oz (88.8 kg)   SpO2 97%   BMI 32.58 kg/m?   ?Wt Readings from Last 3 Encounters:  ?11/26/21 195 lb 12.8 oz (88.8 kg)  ?10/29/21 197 lb (89.4 kg)   ?09/20/21 207 lb (93.9 kg)  ?  ?Physical Exam ?Vitals and nursing note reviewed.  ?Constitutional:   ?   General: She is not in acute distress. ?   Appearance: Normal appearance. She is obese. She is not ill-appearing, toxic-appearing or diaphoretic.  ?HENT:  ?   Head: Normocephalic and atraumatic.  ?   Right Ear: External ear normal. There is impacted cerumen.  ?   Left Ear: Tympanic membrane, ear canal and external ear normal.  ?   Nose: Nose normal.  ?   Mouth/Throat:  ?   Mouth: Mucous membranes are moist.  ?   Pharynx: Oropharynx is clear.  ?Eyes:  ?   General: No scleral icterus.    ?   Right eye: No discharge.     ?   Left eye: No discharge.  ?   Extraocular Movements: Extraocular movements intact.  ?   Conjunctiva/sclera: Conjunctivae normal.  ?   Pupils: Pupils are equal, round, and reactive to light.  ?Cardiovascular:  ?   Rate and Rhythm: Normal rate and regular rhythm.  ?   Pulses: Normal pulses.  ?   Heart sounds: Normal heart sounds. No murmur heard. ?  No friction rub. No gallop.  ?Pulmonary:  ?   Effort: Pulmonary effort is normal. No respiratory distress.  ?   Breath sounds: Normal breath sounds. No stridor. No wheezing, rhonchi or rales.  ?Chest:  ?  Chest wall: No tenderness.  ?Musculoskeletal:     ?   General: Normal range of motion.  ?   Cervical back: Normal range of motion and neck supple.  ?Skin: ?   General: Skin is warm and dry.  ?   Capillary Refill: Capillary refill takes less than 2 seconds.  ?   Coloration: Skin is not jaundiced or pale.  ?   Findings: No bruising, erythema, lesion or rash.  ?Neurological:  ?   General: No focal deficit present.  ?   Mental Status: She is alert and oriented to person, place, and time. Mental status is at baseline.  ?Psychiatric:     ?   Mood and Affect: Mood normal.     ?   Behavior: Behavior normal.     ?   Thought Content: Thought content normal.     ?   Judgment: Judgment normal.  ? ? ?Results for orders placed or performed in visit on 10/29/21   ?Comprehensive metabolic panel  ?Result Value Ref Range  ? Glucose 106 (H) 70 - 99 mg/dL  ? BUN 16 6 - 24 mg/dL  ? Creatinine, Ser 1.45 (H) 0.57 - 1.00 mg/dL  ? eGFR 43 (L) >59 mL/min/1.73  ? BUN/Creatinine Ratio 11 9 - 23  ? Sodium 139 134 - 144 mmol/L  ? Potassium 3.8 3.5 - 5.2 mmol/L  ? Chloride 97 96 - 106 mmol/L  ? CO2 22 20 - 29 mmol/L  ? Calcium 9.3 8.7 - 10.2 mg/dL  ? Total Protein 7.0 6.0 - 8.5 g/dL  ? Albumin 4.7 3.8 - 4.9 g/dL  ? Globulin, Total 2.3 1.5 - 4.5 g/dL  ? Albumin/Globulin Ratio 2.0 1.2 - 2.2  ? Bilirubin Total 0.4 0.0 - 1.2 mg/dL  ? Alkaline Phosphatase 61 44 - 121 IU/L  ? AST 15 0 - 40 IU/L  ? ALT 13 0 - 32 IU/L  ?CBC with Differential/Platelet  ?Result Value Ref Range  ? WBC 8.9 3.4 - 10.8 x10E3/uL  ? RBC 4.71 3.77 - 5.28 x10E6/uL  ? Hemoglobin 14.8 11.1 - 15.9 g/dL  ? Hematocrit 43.5 34.0 - 46.6 %  ? MCV 92 79 - 97 fL  ? MCH 31.4 26.6 - 33.0 pg  ? MCHC 34.0 31.5 - 35.7 g/dL  ? RDW 13.0 11.7 - 15.4 %  ? Platelets 308 150 - 450 x10E3/uL  ? Neutrophils 68 Not Estab. %  ? Lymphs 24 Not Estab. %  ? Monocytes 5 Not Estab. %  ? Eos 2 Not Estab. %  ? Basos 1 Not Estab. %  ? Neutrophils Absolute 6.1 1.4 - 7.0 x10E3/uL  ? Lymphocytes Absolute 2.1 0.7 - 3.1 x10E3/uL  ? Monocytes Absolute 0.4 0.1 - 0.9 x10E3/uL  ? EOS (ABSOLUTE) 0.2 0.0 - 0.4 x10E3/uL  ? Basophils Absolute 0.1 0.0 - 0.2 x10E3/uL  ? Immature Granulocytes 0 Not Estab. %  ? Immature Grans (Abs) 0.0 0.0 - 0.1 x10E3/uL  ?TSH  ?Result Value Ref Range  ? TSH 0.500 0.450 - 4.500 uIU/mL  ? ?   ?Assessment & Plan:  ? ?Problem List Items Addressed This Visit   ? ?  ? Other  ? Class 1 obesity due to excess calories with serious comorbidity and body mass index (BMI) of 32.0 to 32.9 in adult  ?  Improved. Doing well on the 2.71m of saxenda. Continue to monitor. Call with any concerns.  ?  ?  ? ?Other Visit Diagnoses   ? ? Gastroesophageal  reflux disease without esophagitis    -  Primary  ? Improved. Continue to monitor. Call with any concerns.   ?  Diarrhea, unspecified type      ? Improved. Continue to monitor. Call with any concerns.   ? Hearing loss due to cerumen impaction, right      ? Ear flushed today with good results. Continue to monitor. Call if not getting better.   ? ?  ?  ? ?Follow up plan: ?Return as scheduled. ? ? ? ? ? ?

## 2021-12-02 ENCOUNTER — Other Ambulatory Visit (HOSPITAL_COMMUNITY): Payer: Self-pay

## 2021-12-04 ENCOUNTER — Other Ambulatory Visit (HOSPITAL_COMMUNITY): Payer: Self-pay

## 2021-12-09 ENCOUNTER — Other Ambulatory Visit (HOSPITAL_COMMUNITY): Payer: Self-pay

## 2021-12-13 ENCOUNTER — Other Ambulatory Visit: Payer: Self-pay

## 2021-12-17 ENCOUNTER — Encounter: Payer: Self-pay | Admitting: Family Medicine

## 2021-12-23 ENCOUNTER — Other Ambulatory Visit: Payer: Self-pay

## 2021-12-24 ENCOUNTER — Encounter: Payer: Self-pay | Admitting: Family Medicine

## 2021-12-24 ENCOUNTER — Encounter: Payer: No Typology Code available for payment source | Admitting: Family Medicine

## 2021-12-24 ENCOUNTER — Other Ambulatory Visit: Payer: Self-pay

## 2021-12-24 ENCOUNTER — Ambulatory Visit (INDEPENDENT_AMBULATORY_CARE_PROVIDER_SITE_OTHER): Payer: No Typology Code available for payment source | Admitting: Family Medicine

## 2021-12-24 VITALS — BP 122/83 | HR 76 | Temp 98.5°F | Ht 64.17 in | Wt 193.6 lb

## 2021-12-24 DIAGNOSIS — F411 Generalized anxiety disorder: Secondary | ICD-10-CM

## 2021-12-24 DIAGNOSIS — E7849 Other hyperlipidemia: Secondary | ICD-10-CM

## 2021-12-24 DIAGNOSIS — Z0000 Encounter for general adult medical examination without abnormal findings: Secondary | ICD-10-CM

## 2021-12-24 DIAGNOSIS — Z6832 Body mass index (BMI) 32.0-32.9, adult: Secondary | ICD-10-CM

## 2021-12-24 DIAGNOSIS — E559 Vitamin D deficiency, unspecified: Secondary | ICD-10-CM

## 2021-12-24 DIAGNOSIS — Z23 Encounter for immunization: Secondary | ICD-10-CM | POA: Diagnosis not present

## 2021-12-24 DIAGNOSIS — L4050 Arthropathic psoriasis, unspecified: Secondary | ICD-10-CM | POA: Diagnosis not present

## 2021-12-24 DIAGNOSIS — E66811 Obesity, class 1: Secondary | ICD-10-CM

## 2021-12-24 DIAGNOSIS — Z1211 Encounter for screening for malignant neoplasm of colon: Secondary | ICD-10-CM

## 2021-12-24 DIAGNOSIS — R7303 Prediabetes: Secondary | ICD-10-CM

## 2021-12-24 DIAGNOSIS — F331 Major depressive disorder, recurrent, moderate: Secondary | ICD-10-CM

## 2021-12-24 DIAGNOSIS — E6609 Other obesity due to excess calories: Secondary | ICD-10-CM

## 2021-12-24 DIAGNOSIS — M47819 Spondylosis without myelopathy or radiculopathy, site unspecified: Secondary | ICD-10-CM

## 2021-12-24 DIAGNOSIS — J4541 Moderate persistent asthma with (acute) exacerbation: Secondary | ICD-10-CM

## 2021-12-24 DIAGNOSIS — E538 Deficiency of other specified B group vitamins: Secondary | ICD-10-CM | POA: Diagnosis not present

## 2021-12-24 DIAGNOSIS — N183 Chronic kidney disease, stage 3 unspecified: Secondary | ICD-10-CM

## 2021-12-24 LAB — URINALYSIS, ROUTINE W REFLEX MICROSCOPIC
Bilirubin, UA: NEGATIVE
Glucose, UA: NEGATIVE
Ketones, UA: NEGATIVE
Leukocytes,UA: NEGATIVE
Nitrite, UA: NEGATIVE
RBC, UA: NEGATIVE
Specific Gravity, UA: >1.03 — ABNORMAL HIGH (ref 1.005–1.030)
Urobilinogen, Ur: 1 mg/dL (ref 0.2–1.0)
pH, UA: 6 (ref 5.0–7.5)

## 2021-12-24 LAB — MICROALBUMIN, URINE WAIVED
Creatinine, Urine Waived: 300 mg/dL (ref 10–300)
Microalb, Ur Waived: 30 mg/L — ABNORMAL HIGH (ref 0–19)
Microalb/Creat Ratio: <30 mg/g (ref <30)

## 2021-12-24 LAB — BAYER DCA HB A1C WAIVED: HB A1C (BAYER DCA - WAIVED): 5.1 % (ref 4.8–5.6)

## 2021-12-24 MED ORDER — BUPROPION HCL ER (XL) 300 MG PO TB24
300.0000 mg | ORAL_TABLET | Freq: Every day | ORAL | 1 refills | Status: DC
  Filled 2021-12-24 – 2022-02-07 (×2): qty 90, 90d supply, fill #0
  Filled 2022-05-12: qty 90, 90d supply, fill #1

## 2021-12-24 MED ORDER — ATORVASTATIN CALCIUM 40 MG PO TABS
40.0000 mg | ORAL_TABLET | Freq: Every day | ORAL | 1 refills | Status: DC
Start: 2021-12-24 — End: 2022-07-14
  Filled 2021-12-24 – 2022-04-12 (×2): qty 90, 90d supply, fill #0

## 2021-12-24 MED ORDER — FUROSEMIDE 20 MG PO TABS
20.0000 mg | ORAL_TABLET | Freq: Every day | ORAL | 1 refills | Status: DC
Start: 2021-12-24 — End: 2022-07-14
  Filled 2021-12-24 – 2022-01-22 (×2): qty 90, 90d supply, fill #0
  Filled 2022-04-12: qty 90, 90d supply, fill #1

## 2021-12-24 MED ORDER — PREDNISONE 10 MG PO TABS
10.0000 mg | ORAL_TABLET | Freq: Every day | ORAL | 0 refills | Status: DC
Start: 2021-12-24 — End: 2022-03-04
  Filled 2021-12-24: qty 42, 42d supply, fill #0

## 2021-12-24 MED ORDER — DULOXETINE HCL 20 MG PO CPEP
ORAL_CAPSULE | ORAL | 2 refills | Status: DC
  Filled 2021-12-24: qty 30, 33d supply, fill #0

## 2021-12-24 MED ORDER — ALBUTEROL SULFATE HFA 108 (90 BASE) MCG/ACT IN AERS
2.0000 | INHALATION_SPRAY | Freq: Four times a day (QID) | RESPIRATORY_TRACT | 2 refills | Status: DC | PRN
  Filled 2021-12-24: qty 18, 25d supply, fill #0

## 2021-12-24 NOTE — Patient Instructions (Signed)
Please call to schedule your mammogram and/or bone density: ?[facility]  ?Address: [address], , [place] [address] ?Phone: [phone]  ?
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
## 2021-12-25 LAB — LIPID PANEL W/O CHOL/HDL RATIO
Cholesterol, Total: 268 mg/dL — ABNORMAL HIGH (ref 100–199)
HDL: 55 mg/dL (ref >39)
LDL Chol Calc (NIH): 186 mg/dL — ABNORMAL HIGH (ref 0–99)
Triglycerides: 148 mg/dL (ref 0–149)
VLDL Cholesterol Cal: 27 mg/dL (ref 5–40)

## 2021-12-25 LAB — COMPREHENSIVE METABOLIC PANEL
ALT: 15 IU/L (ref 0–32)
AST: 18 IU/L (ref 0–40)
Albumin/Globulin Ratio: 1.8 (ref 1.2–2.2)
Albumin: 4.8 g/dL (ref 3.8–4.9)
Alkaline Phosphatase: 66 IU/L (ref 44–121)
BUN/Creatinine Ratio: 14 (ref 9–23)
BUN: 19 mg/dL (ref 6–24)
Bilirubin Total: 0.5 mg/dL (ref 0.0–1.2)
CO2: 28 mmol/L (ref 20–29)
Calcium: 10 mg/dL (ref 8.7–10.2)
Chloride: 97 mmol/L (ref 96–106)
Creatinine, Ser: 1.36 mg/dL — ABNORMAL HIGH (ref 0.57–1.00)
Globulin, Total: 2.6 g/dL (ref 1.5–4.5)
Glucose: 94 mg/dL (ref 70–99)
Potassium: 3.9 mmol/L (ref 3.5–5.2)
Sodium: 138 mmol/L (ref 134–144)
Total Protein: 7.4 g/dL (ref 6.0–8.5)
eGFR: 46 mL/min/{1.73_m2} — ABNORMAL LOW (ref >59)

## 2021-12-25 LAB — VITAMIN D 25 HYDROXY (VIT D DEFICIENCY, FRACTURES): Vit D, 25-Hydroxy: 40.9 ng/mL (ref 30.0–100.0)

## 2021-12-25 LAB — CBC WITH DIFFERENTIAL/PLATELET
Basophils Absolute: 0.1 10*3/uL (ref 0.0–0.2)
Basos: 1 %
EOS (ABSOLUTE): 0.2 10*3/uL (ref 0.0–0.4)
Eos: 2 %
Hematocrit: 40.5 % (ref 34.0–46.6)
Hemoglobin: 14.4 g/dL (ref 11.1–15.9)
Immature Grans (Abs): 0 10*3/uL (ref 0.0–0.1)
Immature Granulocytes: 0 %
Lymphocytes Absolute: 2.3 10*3/uL (ref 0.7–3.1)
Lymphs: 28 %
MCH: 32.9 pg (ref 26.6–33.0)
MCHC: 35.6 g/dL (ref 31.5–35.7)
MCV: 93 fL (ref 79–97)
Monocytes Absolute: 0.5 10*3/uL (ref 0.1–0.9)
Monocytes: 6 %
Neutrophils Absolute: 5.3 10*3/uL (ref 1.4–7.0)
Neutrophils: 63 %
Platelets: 312 10*3/uL (ref 150–450)
RBC: 4.38 x10E6/uL (ref 3.77–5.28)
RDW: 12.7 % (ref 11.7–15.4)
WBC: 8.3 10*3/uL (ref 3.4–10.8)

## 2021-12-25 LAB — HIV ANTIBODY (ROUTINE TESTING W REFLEX): HIV Screen 4th Generation wRfx: NONREACTIVE

## 2021-12-25 LAB — TSH: TSH: 0.652 u[IU]/mL (ref 0.450–4.500)

## 2021-12-25 LAB — VITAMIN B12: Vitamin B-12: 255 pg/mL (ref 232–1245)

## 2021-12-26 NOTE — Assessment & Plan Note (Signed)
Under good control on current regimen. Continue current regimen. Continue to monitor. Call with any concerns. Refills through pulmonology. Continue to follow with them.  ? ?

## 2021-12-26 NOTE — Assessment & Plan Note (Signed)
Doing great. Down 4lbs in the last 2 months. Continue current regimen. Continue to monitor. Call with any concerns.  ?

## 2021-12-26 NOTE — Assessment & Plan Note (Signed)
Rechecking labs today. Await results. Treat as needed.  °

## 2021-12-26 NOTE — Assessment & Plan Note (Signed)
Will add cymbalta for pain and mood and recheck 1 month. Continue wellbutrin. Call with any concerns. Continue to monitor.  ?

## 2021-12-26 NOTE — Assessment & Plan Note (Signed)
Will add cymbalta for pain and mood and recheck 1 month. Continue wellbutrin. Call with any concerns. Continue to monitor.  ?

## 2021-12-26 NOTE — Assessment & Plan Note (Signed)
Under good control on current regimen. Continue current regimen. Continue to monitor. Call with any concerns. Refills given. Labs drawn today.   

## 2021-12-26 NOTE — Assessment & Plan Note (Signed)
In a flare. Will treat with prednisone starting in 48 hours due to covid shot and she will call her rheumatologist. Continue to monitor.  ?

## 2021-12-26 NOTE — Assessment & Plan Note (Signed)
In a flare. Will treat with prednisone starting in 48 hours due to covid shot and she will call her rheumatologist. Continue to monitor.  ?

## 2022-01-21 ENCOUNTER — Ambulatory Visit: Payer: No Typology Code available for payment source | Admitting: Family Medicine

## 2022-01-22 ENCOUNTER — Other Ambulatory Visit: Payer: Self-pay

## 2022-01-23 ENCOUNTER — Other Ambulatory Visit: Payer: Self-pay

## 2022-01-23 LAB — COLOGUARD: COLOGUARD: NEGATIVE

## 2022-01-30 ENCOUNTER — Other Ambulatory Visit (HOSPITAL_COMMUNITY): Payer: Self-pay

## 2022-02-05 ENCOUNTER — Other Ambulatory Visit: Payer: Self-pay
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
## 2022-02-07 ENCOUNTER — Ambulatory Visit (INDEPENDENT_AMBULATORY_CARE_PROVIDER_SITE_OTHER): Payer: No Typology Code available for payment source | Admitting: Internal Medicine

## 2022-02-07 ENCOUNTER — Ambulatory Visit (HOSPITAL_BASED_OUTPATIENT_CLINIC_OR_DEPARTMENT_OTHER)
Admission: RE | Admit: 2022-02-07 | Discharge: 2022-02-07 | Disposition: A | Discharge status: Home / Self Care | Payer: No Typology Code available for payment source | Source: Ambulatory Visit | Attending: Family Medicine | Admitting: Family Medicine

## 2022-02-07 ENCOUNTER — Other Ambulatory Visit: Payer: Self-pay

## 2022-02-07 ENCOUNTER — Encounter: Payer: Self-pay | Admitting: Internal Medicine

## 2022-02-07 ENCOUNTER — Ambulatory Visit (INDEPENDENT_AMBULATORY_CARE_PROVIDER_SITE_OTHER): Payer: No Typology Code available for payment source

## 2022-02-07 ENCOUNTER — Other Ambulatory Visit (HOSPITAL_BASED_OUTPATIENT_CLINIC_OR_DEPARTMENT_OTHER): Payer: Self-pay | Admitting: Family Medicine

## 2022-02-07 VITALS — BP 126/84 | HR 69 | Resp 15 | Ht 65.25 in | Wt 195.0 lb

## 2022-02-07 DIAGNOSIS — Z8669 Personal history of other diseases of the nervous system and sense organs: Secondary | ICD-10-CM

## 2022-02-07 DIAGNOSIS — M47819 Spondylosis without myelopathy or radiculopathy, site unspecified: Secondary | ICD-10-CM

## 2022-02-07 DIAGNOSIS — Z79899 Other long term (current) drug therapy: Secondary | ICD-10-CM | POA: Insufficient documentation

## 2022-02-07 DIAGNOSIS — Z1231 Encounter for screening mammogram for malignant neoplasm of breast: Secondary | ICD-10-CM | POA: Diagnosis present

## 2022-02-07 DIAGNOSIS — L4059 Other psoriatic arthropathy: Secondary | ICD-10-CM

## 2022-02-07 DIAGNOSIS — L4050 Arthropathic psoriasis, unspecified: Secondary | ICD-10-CM

## 2022-02-07 DIAGNOSIS — N183 Chronic kidney disease, stage 3 unspecified: Secondary | ICD-10-CM

## 2022-02-07 MED ORDER — CYCLOBENZAPRINE HCL 5 MG PO TABS
5.0000 mg | ORAL_TABLET | Freq: Every evening | ORAL | 0 refills | Status: DC | PRN
  Filled 2022-02-07: qty 30, 30d supply, fill #0

## 2022-02-12 ENCOUNTER — Other Ambulatory Visit (HOSPITAL_COMMUNITY): Payer: Self-pay

## 2022-02-13 ENCOUNTER — Other Ambulatory Visit (HOSPITAL_COMMUNITY): Payer: Self-pay

## 2022-02-13 LAB — QUANTIFERON-TB GOLD PLUS
Mitogen-NIL: >10 IU/mL
NIL: 0.02 IU/mL
QuantiFERON-TB Gold Plus: NEGATIVE
TB1-NIL: 0 IU/mL
TB2-NIL: 0.01 IU/mL

## 2022-02-13 LAB — SEDIMENTATION RATE: Sed Rate: 11 mm/h (ref 0–30)

## 2022-02-13 LAB — C-REACTIVE PROTEIN: CRP: 1.2 mg/L (ref <8.0)

## 2022-02-18 ENCOUNTER — Other Ambulatory Visit (HOSPITAL_COMMUNITY): Payer: Self-pay

## 2022-02-18 ENCOUNTER — Other Ambulatory Visit: Payer: Self-pay
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
## 2022-02-20 ENCOUNTER — Ambulatory Visit (INDEPENDENT_AMBULATORY_CARE_PROVIDER_SITE_OTHER): Payer: No Typology Code available for payment source | Admitting: Internal Medicine

## 2022-02-20 ENCOUNTER — Encounter: Payer: Self-pay | Admitting: Internal Medicine

## 2022-02-20 ENCOUNTER — Other Ambulatory Visit: Payer: Self-pay

## 2022-02-20 VITALS — BP 121/87 | HR 63 | Temp 98.3°F | Ht 65.24 in | Wt 195.0 lb

## 2022-02-20 DIAGNOSIS — N200 Calculus of kidney: Secondary | ICD-10-CM | POA: Diagnosis not present

## 2022-02-20 DIAGNOSIS — R300 Dysuria: Secondary | ICD-10-CM | POA: Diagnosis not present

## 2022-02-20 LAB — URINALYSIS, ROUTINE W REFLEX MICROSCOPIC
Bilirubin, UA: NEGATIVE
Glucose, UA: NEGATIVE
Ketones, UA: NEGATIVE
Nitrite, UA: NEGATIVE
RBC, UA: NEGATIVE
Specific Gravity, UA: 1.02 (ref 1.005–1.030)
Urobilinogen, Ur: 0.2 mg/dL (ref 0.2–1.0)
pH, UA: 7.5 (ref 5.0–7.5)

## 2022-02-20 LAB — MICROSCOPIC EXAMINATION
Bacteria, UA: NONE SEEN
RBC, Urine: NONE SEEN /hpf (ref 0–2)

## 2022-02-20 MED ORDER — SULFAMETHOXAZOLE-TRIMETHOPRIM 800-160 MG PO TABS
1.0000 | ORAL_TABLET | Freq: Two times a day (BID) | ORAL | 0 refills | Status: AC
  Filled 2022-02-20: qty 14, 7d supply, fill #0

## 2022-02-20 NOTE — Progress Notes (Signed)
BP 121/87   Pulse 63   Temp 98.3 F (36.8 C) (Oral)   Ht 5' 5.24" (1.657 m)   Wt 195 lb (88.5 kg)   SpO2 100%   BMI 32.22 kg/m    Subjective:    Patient ID: Emma Rogers, female    DOB: [birth year], 56 y.o.   MRN: [id number]  Chief Complaint  Patient presents with   Urinary Frequency    W/ flank pain, started on Sunday    HPI: Emma Rogers is a 56 y.o. female  Urinary Tract Infection  This is a new (has left sided flank pain , has a kidney stone sees a urologist) problem. The current episode started in the past 7 days. The pain is at a severity of 4/10. There has been no fever. Associated symptoms include flank pain, frequency, nausea and urgency. Pertinent negatives include no chills, discharge, hematuria, hesitancy, possible pregnancy, sweats or vomiting.   Chief Complaint  Patient presents with   Urinary Frequency    W/ flank pain, started on Sunday    Relevant past medical, surgical, family and social history reviewed and updated as indicated. Interim medical history since our last visit reviewed. Allergies and medications reviewed and updated.  Review of Systems  Constitutional:  Negative for chills.  Gastrointestinal:  Positive for nausea. Negative for vomiting.  Genitourinary:  Positive for flank pain, frequency and urgency. Negative for hematuria and hesitancy.   Per HPI unless specifically indicated above     Objective:    BP 121/87   Pulse 63   Temp 98.3 F (36.8 C) (Oral)   Ht 5' 5.24" (1.657 m)   Wt 195 lb (88.5 kg)   SpO2 100%   BMI 32.22 kg/m   Wt Readings from Last 3 Encounters:  02/20/22 195 lb (88.5 kg)  02/07/22 195 lb (88.5 kg)  12/24/21 193 lb 9.6 oz (87.8 kg)    Physical Exam Vitals and nursing note reviewed.  Constitutional:      General: She is not in acute distress.    Appearance: Normal appearance. She is not ill-appearing or diaphoretic.  Eyes:     Conjunctiva/sclera: Conjunctivae normal.  Pulmonary:     Breath sounds: No  rhonchi.  Abdominal:     General: Abdomen is flat. Bowel sounds are normal. There is no distension.     Palpations: Abdomen is soft. There is no mass.     Tenderness: There is no abdominal tenderness. There is no left CVA tenderness, guarding or rebound.     Hernia: No hernia is present.  Musculoskeletal:        General: Tenderness present.  Skin:    General: Skin is dry.     Coloration: Skin is not jaundiced.     Findings: No erythema.  Neurological:     Mental Status: She is alert.    Results for orders placed or performed in visit on 02/07/22  Sedimentation rate  Result Value Ref Range   Sed Rate 11 0 - 30 mm/h  C-reactive protein  Result Value Ref Range   CRP 1.2 <8.0 mg/L  QuantiFERON-TB Gold Plus  Result Value Ref Range   QuantiFERON-TB Gold Plus NEGATIVE NEGATIVE   NIL 0.02 IU/mL   Mitogen-NIL >10.00 IU/mL   TB1-NIL 0.00 IU/mL   TB2-NIL 0.01 IU/mL        Current Outpatient Medications:    sulfamethoxazole-trimethoprim (BACTRIM DS) 800-160 MG tablet, Take 1 tablet by mouth 2 (two) times daily for 7 days., Disp: 14  tablet, Rfl: 0   albuterol (VENTOLIN HFA) 108 (90 Base) MCG/ACT inhaler, INHALE 2 PUFFS INTO THE LUNGS EVERY 6 (SIX) HOURS AS NEEDED FOR WHEEZING OR SHORTNESS OF BREATH., Disp: 18 g, Rfl: 2   atorvastatin (LIPITOR) 40 MG tablet, Take 1 tablet (40 mg total) by mouth at bedtime., Disp: 90 tablet, Rfl: 1   budesonide-formoterol (SYMBICORT) 80-4.5 MCG/ACT inhaler, Inhale 2 puffs into the lungs 2 (two) times daily., Disp: 1 Inhaler, Rfl: 12   buPROPion (WELLBUTRIN XL) 300 MG 24 hr tablet, Take 1 tablet (300 mg total) by mouth daily., Disp: 90 tablet, Rfl: 1   Calcipotriene-Betameth Diprop (WYNZORA) 0.005-0.064 % CREA, Apply 1 application topically daily. Avoid applying to face, groin, and axilla. Use as directed. Risk of skin atrophy with long-term use reviewed. (Patient not taking: Reported on 12/24/2021), Disp: 60 g, Rfl: 1   Cyanocobalamin (VITAMIN B-12 PO), Take  by mouth., Disp: , Rfl:    cyclobenzaprine (FLEXERIL) 5 MG tablet, Take 1 tablet (5 mg total) by mouth at bedtime as needed (Back pain or muscle spasm)., Disp: 30 tablet, Rfl: 0   DULoxetine (CYMBALTA) 20 MG capsule, Take 1 pill every other day for 1 week, then increase to daily (Patient not taking: Reported on 02/07/2022), Disp: 30 capsule, Rfl: 2   furosemide (LASIX) 20 MG tablet, Take 1 tablet (20 mg total) by mouth daily., Disp: 90 tablet, Rfl: 1   Insulin Pen Needle 31G X 5 MM MISC, Use as directed., Disp: 100 each, Rfl: 12   ipratropium (ATROVENT) 0.03 % nasal spray, PLACE 2 SPRAYS INTO BOTH NOSTRILS EVERY 12 HOURS., Disp: 30 mL, Rfl: 0   omeprazole (PRILOSEC) 20 MG capsule, Take 1 capsule (20 mg total) by mouth daily., Disp: 30 capsule, Rfl: 3   ondansetron (ZOFRAN) 4 MG tablet, Take 1 tablet (4 mg total) by mouth every 6 (six) hours., Disp: 12 tablet, Rfl: 0   predniSONE (DELTASONE) 10 MG tablet, Take 1 tablet (10 mg total) by mouth daily with breakfast., Disp: 42 tablet, Rfl: 0   rizatriptan (MAXALT) 10 MG tablet, Take 1 tablet (10 mg total) by mouth as needed for migraine. May repeat in 2 hours if needed, Disp: 10 tablet, Rfl: 12   traZODone (DESYREL) 100 MG tablet, Take 1-1.5 tablets (100-150 mg total) by mouth at bedtime as needed. for sleep, Disp: 135 tablet, Rfl: 1   ustekinumab (STELARA) 45 MG/0.5ML SOSY injection, Inject into the skin at week 4 then every 12 weeks as directed, Disp: 0.5 mL, Rfl: 5   VITAMIN D PO, Take 1 tablet by mouth daily., Disp: , Rfl:   Current Facility-Administered Medications:    cyanocobalamin ((VITAMIN B-12)) injection 1,000 mcg, 1,000 mcg, Intramuscular, Q30 days, Johnson, Megan P, DO    Assessment & Plan:    UTI / nephrolithiaisis will need to fu with urology sees dr. Lonna CobbStoioff.  check UA.  pt is currently symptomatic for an Urinary tract infection(abd pain, burning etc), will cover with emperic abx, see med module for details.  encouraged to increase  water/fluid intake.Signs and symptoms of emergency were discussed with the patient. The risks, benefits and side effects of treatment were discussed with the patient. The patient verbalized an understanding of plan, and was told to call the clinic/go to the ED if symptoms worsen at any point of time.   Problem List Items Addressed This Visit       Genitourinary   Nephrolithiasis   Relevant Orders   DG Abd 2 Views  Other   Dysuria - Primary   Relevant Orders   Urinalysis, Routine w reflex microscopic   Urinalysis, Routine w reflex microscopic   Urine Culture   DG Abd 2 Views     Orders Placed This Encounter  Procedures   Urine Culture   DG Abd 2 Views   Urinalysis, Routine w reflex microscopic   Urinalysis, Routine w reflex microscopic     Meds ordered this encounter  Medications   sulfamethoxazole-trimethoprim (BACTRIM DS) 800-160 MG tablet    Sig: Take 1 tablet by mouth 2 (two) times daily for 7 days.    Dispense:  14 tablet    Refill:  0     Follow up plan: No follow-ups on file.

## 2022-02-21 ENCOUNTER — Emergency Department: Payer: No Typology Code available for payment source

## 2022-02-21 ENCOUNTER — Encounter: Payer: Self-pay | Admitting: Medical Oncology

## 2022-02-21 ENCOUNTER — Telehealth: Payer: No Typology Code available for payment source | Admitting: Family Medicine

## 2022-02-21 ENCOUNTER — Emergency Department
Admission: EM | Admit: 2022-02-21 | Discharge: 2022-02-21 | Disposition: A | Discharge status: Home / Self Care | Payer: No Typology Code available for payment source | Attending: Emergency Medicine | Admitting: Emergency Medicine

## 2022-02-21 ENCOUNTER — Other Ambulatory Visit: Payer: Self-pay

## 2022-02-21 DIAGNOSIS — R112 Nausea with vomiting, unspecified: Secondary | ICD-10-CM | POA: Diagnosis not present

## 2022-02-21 DIAGNOSIS — R109 Unspecified abdominal pain: Secondary | ICD-10-CM

## 2022-02-21 DIAGNOSIS — R1032 Left lower quadrant pain: Secondary | ICD-10-CM | POA: Insufficient documentation

## 2022-02-21 LAB — URINALYSIS, ROUTINE W REFLEX MICROSCOPIC
Bilirubin Urine: NEGATIVE
Glucose, UA: NEGATIVE mg/dL
Hgb urine dipstick: NEGATIVE
Ketones, ur: NEGATIVE mg/dL
Leukocytes,Ua: NEGATIVE
Nitrite: NEGATIVE
Protein, ur: 30 mg/dL — AB
Specific Gravity, Urine: 1.021 (ref 1.005–1.030)
pH: 9 — ABNORMAL HIGH (ref 5.0–8.0)

## 2022-02-21 LAB — CBC
HCT: 42.6 % (ref 36.0–46.0)
Hemoglobin: 14.3 g/dL (ref 12.0–15.0)
MCH: 31 pg (ref 26.0–34.0)
MCHC: 33.6 g/dL (ref 30.0–36.0)
MCV: 92.4 fL (ref 80.0–100.0)
Platelets: 329 10*3/uL (ref 150–400)
RBC: 4.61 MIL/uL (ref 3.87–5.11)
RDW: 12.8 % (ref 11.5–15.5)
WBC: 6.9 10*3/uL (ref 4.0–10.5)
nRBC: 0 % (ref 0.0–0.2)

## 2022-02-21 LAB — BASIC METABOLIC PANEL
Anion gap: 7 (ref 5–15)
BUN: 17 mg/dL (ref 6–20)
CO2: 29 mmol/L (ref 22–32)
Calcium: 9.4 mg/dL (ref 8.9–10.3)
Chloride: 102 mmol/L (ref 98–111)
Creatinine, Ser: 0.89 mg/dL (ref 0.44–1.00)
GFR, Estimated: >60 mL/min (ref >60)
Glucose, Bld: 109 mg/dL — ABNORMAL HIGH (ref 70–99)
Potassium: 4.2 mmol/L (ref 3.5–5.1)
Sodium: 138 mmol/L (ref 135–145)

## 2022-02-21 LAB — POC URINE PREG, ED: Preg Test, Ur: NEGATIVE

## 2022-02-21 MED ORDER — ONDANSETRON HCL 4 MG/2ML IJ SOLN
4.0000 mg | Freq: Once | INTRAMUSCULAR | Status: AC
Start: 2022-02-21 — End: 2022-02-21
  Administered 2022-02-21: 4 mg via INTRAVENOUS
  Filled 2022-02-21: qty 2

## 2022-02-21 MED ORDER — KETOROLAC TROMETHAMINE 30 MG/ML IJ SOLN
30.0000 mg | Freq: Once | INTRAMUSCULAR | Status: AC
  Administered 2022-02-21: 30 mg via INTRAVENOUS
  Filled 2022-02-21: qty 1

## 2022-02-21 MED ORDER — SODIUM CHLORIDE 0.9 % IV BOLUS
1000.0000 mL | Freq: Once | INTRAVENOUS | Status: AC
  Administered 2022-02-21: 1000 mL via INTRAVENOUS

## 2022-02-21 MED ORDER — ONDANSETRON 4 MG PO TBDP
4.0000 mg | ORAL_TABLET | Freq: Three times a day (TID) | ORAL | 0 refills | Status: DC | PRN
  Filled 2022-02-21: qty 15, 5d supply, fill #0

## 2022-02-21 MED ORDER — ONDANSETRON HCL 4 MG/2ML IJ SOLN
4.0000 mg | Freq: Once | INTRAMUSCULAR | Status: AC
  Administered 2022-02-21: 4 mg via INTRAVENOUS
  Filled 2022-02-21: qty 2

## 2022-02-21 MED ORDER — OXYCODONE-ACETAMINOPHEN 5-325 MG PO TABS
1.0000 | ORAL_TABLET | Freq: Four times a day (QID) | ORAL | 0 refills | Status: DC | PRN
  Filled 2022-02-21: qty 15, 4d supply, fill #0

## 2022-02-21 NOTE — Progress Notes (Signed)
[place]   Worsening symptoms of possible kidney infection or stone. Seen by PCP yesterday and advised to go to ED if symptoms worsen. Was + for UTI in office, started vomiting overnight.  She is recommended to go to the ED now for follow up on this.  Patient acknowledged agreement and understanding of the plan.

## 2022-02-21 NOTE — ED Provider Notes (Signed)
[facility] Provider Note    Event Date/Time   First MD Initiated Contact with Patient 02/21/22 [phone]     (approximate)   History   Flank Pain and Emesis   HPI  Emma Rogers is a 56 y.o. female   presents to the ED with complaint of left-sided flank pain that started 5 days ago.  Patient states that she was seen by her PCP yesterday and was told that she had a urinary tract infection.  A prescription for antibiotics was sent to the pharmacy however the pharmacy at [facility] closed before she was able to pick this up.  Patient has a history of kidney stones in the past and has been seen by urology.  Patient has nausea with vomiting.  She denies any fever.      Physical Exam   Triage Vital Signs: ED Triage Vitals  Enc Vitals Group     BP 02/21/22 0853 (!) 144/80     Pulse Rate 02/21/22 0853 83     Resp 02/21/22 0853 16     Temp 02/21/22 0853 98.5 F (36.9 C)     Temp Source 02/21/22 0853 Oral     SpO2 02/21/22 0853 97 %     Weight 02/21/22 0854 194 lb 0.1 oz (88 kg)     Height 02/21/22 0854 5\' 5"  (1.651 m)     Head Circumference --      Peak Flow --      Pain Score 02/21/22 0854 4     Pain Loc --      Pain Edu? --      Excl. in GC? --     Most recent vital signs: Vitals:   02/21/22 1200 02/21/22 1230  BP: 111/85 117/68  Pulse: 78 66  Resp: 17   Temp:    SpO2: 99% 97%     General: Awake, no distress.  Appears uncomfortable. CV:  Good peripheral perfusion.  Resp:  Normal effort.  Abd:  No distention.  Soft, nontender, bowel sounds normoactive x4 quadrants.  There is some minimal left flank tenderness on percussion.  Patient reports this also radiates into the left groin area. Other:     ED Results / Procedures / Treatments   Labs (all labs ordered are listed, but only abnormal results are displayed) Labs Reviewed  URINALYSIS, ROUTINE W REFLEX MICROSCOPIC - Abnormal; Notable for the following components:      Result Value   Color,  Urine YELLOW (*)    APPearance CLOUDY (*)    pH 9.0 (*)    Protein, ur 30 (*)    Bacteria, UA RARE (*)    All other components within normal limits  BASIC METABOLIC PANEL - Abnormal; Notable for the following components:   Glucose, Bld 109 (*)    All other components within normal limits  CBC  POC URINE PREG, ED      RADIOLOGY CT renal study shows a 2 to 3 cm nonobstructing calculi in the lower pole no obstruction per radiologist.    PROCEDURES:  Critical Care performed:   Procedures   MEDICATIONS ORDERED IN ED: Medications  ondansetron (ZOFRAN) injection 4 mg (4 mg Intravenous Given 02/21/22 1011)  ketorolac (TORADOL) 30 MG/ML injection 30 mg (30 mg Intravenous Given 02/21/22 1115)  sodium chloride 0.9 % bolus 1,000 mL (0 mLs Intravenous Stopped 02/21/22 1231)  ondansetron (ZOFRAN) injection 4 mg (4 mg Intravenous Given 02/21/22 1228)     IMPRESSION / MDM / ASSESSMENT  AND PLAN / ED COURSE  I reviewed the triage vital signs and the nursing notes.   Differential diagnosis includes, but is not limited to, urinary tract infection, kidney stone, obstruction, hydronephrosis secondary to obstruction, urolithiasis.  56 year old female presents to the ED with complaint of left flank pain that has been going on for approximately 5 days.  She was seen by her PCP and placed on antibiotics for urinary tract infection.  Today she has had nausea and was told to come to the emergency department for further evaluation.  CT scan did not show a renal stone obstruction and was reassuring for the patient.  She was given Zofran IV and Toradol IV with some improvement of her pain.  Patient was discharged with a prescription for Percocet and Zofran to take at home.  Patient is also to continue taking the antibiotic that was prescribed by her PCP.  She is to follow-up with her urologist that she is already established with.      Patient's presentation is most consistent with acute complicated illness  / injury requiring diagnostic workup.  FINAL CLINICAL IMPRESSION(S) / ED DIAGNOSES   Final diagnoses:  Left flank pain     Rx / DC Orders   ED Discharge Orders          Ordered    oxyCODONE-acetaminophen (PERCOCET) 5-325 MG tablet  Every 6 hours PRN        02/21/22 1227    ondansetron (ZOFRAN-ODT) 4 MG disintegrating tablet  Every 8 hours PRN        02/21/22 1227             Note:  This document was prepared using Dragon voice recognition software and may include unintentional dictation errors.   Tommi Rumps, PA-C 02/21/22 1605    Jene Every, MD 02/24/22 [phone]

## 2022-02-21 NOTE — Patient Instructions (Signed)
Please go to the nearest ED for care as discussed

## 2022-02-21 NOTE — ED Triage Notes (Signed)
Pt ambulatory with reports of left sided flank pain since Sunday

## 2022-02-21 NOTE — Discharge Instructions (Signed)
Take antibiotic as prescribed by your PCP.  A prescription for Zofran for nausea and Percocet was sent to the pharmacy to take as needed.  Increase fluids to stay hydrated.  Return to the emergency department if any severe worsening of your symptoms over the weekend.

## 2022-02-22 LAB — URINE CULTURE: Organism ID, Bacteria: NO GROWTH

## 2022-03-04 ENCOUNTER — Telehealth: Payer: No Typology Code available for payment source | Admitting: Pharmacist

## 2022-03-04 ENCOUNTER — Ambulatory Visit (INDEPENDENT_AMBULATORY_CARE_PROVIDER_SITE_OTHER): Payer: No Typology Code available for payment source | Admitting: Internal Medicine

## 2022-03-04 ENCOUNTER — Encounter: Payer: Self-pay | Admitting: Internal Medicine

## 2022-03-04 VITALS — BP 109/75 | HR 65 | Resp 16 | Ht 65.0 in | Wt 195.6 lb

## 2022-03-04 DIAGNOSIS — L4050 Arthropathic psoriasis, unspecified: Secondary | ICD-10-CM | POA: Diagnosis not present

## 2022-03-04 DIAGNOSIS — M47819 Spondylosis without myelopathy or radiculopathy, site unspecified: Secondary | ICD-10-CM

## 2022-03-04 DIAGNOSIS — Z79899 Other long term (current) drug therapy: Secondary | ICD-10-CM | POA: Diagnosis not present

## 2022-03-04 DIAGNOSIS — Z8669 Personal history of other diseases of the nervous system and sense organs: Secondary | ICD-10-CM | POA: Diagnosis not present

## 2022-03-04 DIAGNOSIS — N183 Chronic kidney disease, stage 3 unspecified: Secondary | ICD-10-CM

## 2022-03-04 NOTE — Telephone Encounter (Signed)
Submitted a Prior Authorization request to [facility] for [facility] via CoverMyMeds. Will update once we receive a response.  Key: Miguel Rota, PharmD, MPH, BCPS, CPP Clinical Pharmacist (Rheumatology and Pulmonology)

## 2022-03-04 NOTE — Telephone Encounter (Signed)
Please start Tremfya BIV. Pending OV note to be signed  Dose: 100mg  at Week 0, Week 4, then every 8 weeks thereafter  Dx: Psoriatic arthritis (L40.5) and Psoriasis (L40.9)  Previously tried therapies: Humira - worsening psoriasis Taltz - inadequate clinical response Stelara (current) - minimal improvement in PsA  Current regimen: Stelara - last dose was in March 2023 and was due for dose last week.  She will continue to hold Stelara. She will not need new start visit for Tremfya per Dr. April 2023  Patient is [employer] employee and will have to fill with [facility] and will be eligible for copay card  [facility], PharmD, MPH, BCPS, CPP Clinical Pharmacist (Rheumatology and Pulmonology)

## 2022-03-04 NOTE — Progress Notes (Signed)
Pharmacy Note  Subjective: Patient presents today to [facility] for follow up office visit.  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  Prior therapy includes: Humira, Altamease Oiler, and currently takes Stelara. Last dose of Stelara was in March, she was due last week.     Objective:  CBC    Component Value Date/Time   WBC 6.9 02/21/2022 0915   RBC 4.61 02/21/2022 0915   HGB 14.3 02/21/2022 0915   HGB 14.4 12/24/2021 1640   HCT 42.6 02/21/2022 0915   HCT 40.5 12/24/2021 1640   PLT 329 02/21/2022 0915   PLT 312 12/24/2021 1640   MCV 92.4 02/21/2022 0915   MCV 93 12/24/2021 1640   MCV 92 06/15/2014 2324   MCH 31.0 02/21/2022 0915   MCHC 33.6 02/21/2022 0915   RDW 12.8 02/21/2022 0915   RDW 12.7 12/24/2021 1640   RDW 13.0 06/15/2014 2324   LYMPHSABS 2.3 12/24/2021 1640   MONOABS 0.3 01/14/2019 1314   EOSABS 0.2 12/24/2021 1640   BASOSABS 0.1 12/24/2021 1640     CMP     Component Value Date/Time   NA 138 02/21/2022 0915   NA 138 12/24/2021 1640   NA 135 (L) 06/15/2014 2324   K 4.2 02/21/2022 0915   K 3.3 (L) 06/15/2014 2324   CL 102 02/21/2022 0915   CL 98 06/15/2014 2324   CO2 29 02/21/2022 0915   CO2 31 06/15/2014 2324   GLUCOSE 109 (H) 02/21/2022 0915   GLUCOSE 122 (H) 06/15/2014 2324   BUN 17 02/21/2022 0915   BUN 19 12/24/2021 1640   BUN 15 06/15/2014 2324   CREATININE 0.89 02/21/2022 0915   CREATININE 1.14 (H) 03/01/2019 1452   CALCIUM 9.4 02/21/2022 0915   CALCIUM 9.2 06/15/2014 2324   PROT 7.4 12/24/2021 1640   ALBUMIN 4.8 12/24/2021 1640   AST 18 12/24/2021 1640   ALT 15 12/24/2021 1640   ALKPHOS 66 12/24/2021 1640   BILITOT 0.5 12/24/2021 1640   GFRNONAA >60 02/21/2022 0915   GFRNONAA 49 (L) 11/01/2018 1156   GFRAA 76 01/12/2020 1607   GFRAA 57 (L) 11/01/2018 1156     Baseline Immunosuppressant Therapy Labs TB GOLD    Latest Ref Rng & Units 02/07/2022    9:05 AM   Quantiferon TB Gold  Quantiferon TB Gold Plus NEGATIVE NEGATIVE    Hepatitis Panel   HIV Lab Results  Component Value Date   HIV Non Reactive 12/24/2021   Immunoglobulins   SPEP    Latest Ref Rng & Units 12/24/2021    4:40 PM  Serum Protein Electrophoresis  Total Protein 6.0 - 8.5 g/dL 7.4    [id number] No results found for: "G6PDH" TPMT No results found for: "TPMT"   Chest x-ray: 01/08/21- No acute disease.  Assessment/Plan:  Counseled patient that Tremfya is a IL-23 inhibitor.  Counseled patient on purpose, proper use, and adverse effects of Tremfya.  Reviewed the most common adverse effects including infection, URTIs, injection site reactions, nausea/diarrhea, and recurrence of tinea and HSV infections. Counseled patient that Tremfya should be held for infection and prior to scheduled surgery.   Reviewed the importance of regular labs while on Tremfya therapy.  Will monitor CBC and CMP 1 month after starting and then every 3 months routinely thereafter. Will monitor TB gold annually. Standing orders placed.  Provided patient with medication education material and answered all questions.  Patient voiced understanding.  Patient consented to  Tremfya.  Will upload consent into the media tab.  Reviewed storage instructions of Tremfya.   Patient verbalized understanding.  Will apply for Tremfya through patient's insurance and update when we receive a response.  Patient advised to continue to hold Stelara.  Dose will be Tremfya 100 mg on weeks 0 and 4 then every 8 weeks thereafter.  Prescription pending lab results and insurance approval. Patient will not need new start visit per Dr.Rice.  Sherald Hess, PharmD Candidate 03/04/2022 9:54 AM

## 2022-03-07 ENCOUNTER — Other Ambulatory Visit (HOSPITAL_COMMUNITY): Payer: Self-pay

## 2022-03-07 NOTE — Telephone Encounter (Signed)
Received notification from [facility] regarding a prior authorization for [facility].   The authorization is effective for a maximum of 1 fill per 28 days from 03/06/2022 to 04/05/2022. PA # [id number]  Maintenance dose prior authorization has been entered for Tremfya 100mg /mL for 1 auto-injector per 56 days effective 03/27/2022 through 08/24/2022 with 3 fills; PA # [id number]  Per test claim, copay for 28 days supply is $500  Patient must fill through [facility]: [phone]   Enrolled patient into Tremfya copay card: BIN: [id number] Group: [id number] ID: [id number]  [id number], PharmD, MPH, BCPS, CPP Clinical Pharmacist (Rheumatology and Pulmonology)

## 2022-03-10 NOTE — Telephone Encounter (Signed)
ATC patient to update her on the [facility] approval and set up shipment from [facility]. LVM asking patient to return call.   Valeda Malm, Pharm.D. PGY-1 Pharmacy Resident 03/10/2022 8:17 AM

## 2022-03-12 ENCOUNTER — Ambulatory Visit: Payer: No Typology Code available for payment source | Attending: Family Medicine | Admitting: Pharmacist

## 2022-03-12 ENCOUNTER — Other Ambulatory Visit (HOSPITAL_COMMUNITY): Payer: Self-pay

## 2022-03-12 DIAGNOSIS — Z79899 Other long term (current) drug therapy: Secondary | ICD-10-CM

## 2022-03-12 DIAGNOSIS — L4050 Arthropathic psoriasis, unspecified: Secondary | ICD-10-CM

## 2022-03-12 MED ORDER — TREMFYA 100 MG/ML ~~LOC~~ SOAJ
SUBCUTANEOUS | 0 refills | Status: DC
  Filled 2022-03-12: qty 1, fill #0

## 2022-03-12 MED ORDER — TREMFYA 100 MG/ML ~~LOC~~ SOAJ
SUBCUTANEOUS | 1 refills | Status: DC
  Filled 2022-03-12: qty 1, fill #0

## 2022-03-12 MED ORDER — TREMFYA 100 MG/ML ~~LOC~~ SOAJ
SUBCUTANEOUS | 0 refills | Status: DC
  Filled 2022-03-12: qty 1, 28d supply, fill #0
  Filled 2022-03-12: qty 1, fill #0

## 2022-03-12 MED ORDER — TREMFYA 100 MG/ML ~~LOC~~ SOAJ
SUBCUTANEOUS | 1 refills | Status: DC
  Filled 2022-03-12: qty 1, fill #0
  Filled 2022-04-02: qty 1, 56d supply, fill #0
  Filled 2022-05-12 – 2022-06-02 (×2): qty 1, 56d supply, fill #1

## 2022-03-12 NOTE — Progress Notes (Signed)
   S: Patient presents today for review of their specialty medication.   Patient is currently taking Tremfya for psoriatic arthritis. Patient is managed by Dr. Dimple Casey for this. She has tried and failed Humira, Runner, broadcasting/film/video, and [place].  Dosing: Plaque psoriasis: SubQ: 100 mg at weeks 0, 4, and then every 8 weeks thereafter.  Adherence: has not yet started   Efficacy: has not yet started   Monitoring:  S/sx of infection: none  S/sx of hypersensitivity: none   Current adverse effects: none    O:     Lab Results  Component Value Date   WBC 6.9 02/21/2022   HGB 14.3 02/21/2022   HCT 42.6 02/21/2022   MCV 92.4 02/21/2022   PLT 329 02/21/2022      Chemistry      Component Value Date/Time   NA 138 02/21/2022 0915   NA 138 12/24/2021 1640   NA 135 (L) 06/15/2014 2324   K 4.2 02/21/2022 0915   K 3.3 (L) 06/15/2014 2324   CL 102 02/21/2022 0915   CL 98 06/15/2014 2324   CO2 29 02/21/2022 0915   CO2 31 06/15/2014 2324   BUN 17 02/21/2022 0915   BUN 19 12/24/2021 1640   BUN 15 06/15/2014 2324   CREATININE 0.89 02/21/2022 0915   CREATININE 1.14 (H) 03/01/2019 1452      Component Value Date/Time   CALCIUM 9.4 02/21/2022 0915   CALCIUM 9.2 06/15/2014 2324   ALKPHOS 66 12/24/2021 1640   AST 18 12/24/2021 1640   ALT 15 12/24/2021 1640   BILITOT 0.5 12/24/2021 1640       A/P: 1. Medication review: patient is about to try Tremfya for plaque psoriasis. Reviewed the medication with the patient, including the following: Tremfya is a medication used in the treatment of plaque psoriasis. Administer SubQ into front of thighs, lower abdomen (except for 2 inches around navel), or back of upper arms; do not inject into areas where the skin is tender, bruised, red, hard, thick, scaly, or affected by psoriasis. Possible adverse reactions include increased risk of infection, headache, and hypersensitivity reactions. Live vaccinations should be avoided. No recommendations for any  changes.  Butch Penny, PharmD, P[place]y [place], CPP Clinical Pharmacist [facility] [phone]

## 2022-03-12 NOTE — Telephone Encounter (Signed)
Rx has been rewritten by Franky Macho, ATC pt and LVM requesting call back to discuss copay on direct line. [facility] will need to order medication prior to shipment.

## 2022-03-13 ENCOUNTER — Encounter: Payer: Self-pay | Admitting: Family Medicine

## 2022-03-13 ENCOUNTER — Other Ambulatory Visit: Payer: Self-pay

## 2022-03-13 ENCOUNTER — Ambulatory Visit (INDEPENDENT_AMBULATORY_CARE_PROVIDER_SITE_OTHER): Payer: No Typology Code available for payment source | Admitting: Family Medicine

## 2022-03-13 ENCOUNTER — Other Ambulatory Visit (HOSPITAL_COMMUNITY): Payer: Self-pay

## 2022-03-13 DIAGNOSIS — E6609 Other obesity due to excess calories: Secondary | ICD-10-CM | POA: Diagnosis not present

## 2022-03-13 DIAGNOSIS — G4701 Insomnia due to medical condition: Secondary | ICD-10-CM

## 2022-03-13 DIAGNOSIS — Z6832 Body mass index (BMI) 32.0-32.9, adult: Secondary | ICD-10-CM

## 2022-03-13 DIAGNOSIS — F331 Major depressive disorder, recurrent, moderate: Secondary | ICD-10-CM

## 2022-03-13 MED ORDER — SEMAGLUTIDE-WEIGHT MANAGEMENT 1 MG/0.5ML ~~LOC~~ SOAJ
1.0000 mg | SUBCUTANEOUS | 0 refills | Status: AC
Start: 2022-05-10 — End: 2022-06-07
  Filled 2022-03-13 – 2022-05-12 (×2): qty 2, 28d supply, fill #0

## 2022-03-13 MED ORDER — SEMAGLUTIDE-WEIGHT MANAGEMENT 0.5 MG/0.5ML ~~LOC~~ SOAJ
0.5000 mg | SUBCUTANEOUS | 0 refills | Status: AC
Start: 2022-04-11 — End: 2022-05-09
  Filled 2022-03-13 – 2022-06-05 (×3): qty 2, 28d supply, fill #0

## 2022-03-13 MED ORDER — BELSOMRA 5 MG PO TABS
5.0000 mg | ORAL_TABLET | Freq: Every evening | ORAL | 1 refills | Status: DC | PRN
  Filled 2022-03-13: qty 30, 30d supply, fill #0

## 2022-03-13 MED ORDER — NORTRIPTYLINE HCL 25 MG PO CAPS
25.0000 mg | ORAL_CAPSULE | Freq: Every day | ORAL | 2 refills | Status: DC
  Filled 2022-03-13: qty 30, 30d supply, fill #0
  Filled 2022-04-12: qty 30, 30d supply, fill #1

## 2022-03-13 MED ORDER — SEMAGLUTIDE-WEIGHT MANAGEMENT 0.25 MG/0.5ML ~~LOC~~ SOAJ
0.2500 mg | SUBCUTANEOUS | 0 refills | Status: AC
Start: 2022-03-13 — End: 2022-04-10
  Filled 2022-03-13 – 2022-04-08 (×4): qty 2, 28d supply, fill #0

## 2022-03-13 MED ORDER — SEMAGLUTIDE-WEIGHT MANAGEMENT 2.4 MG/0.75ML ~~LOC~~ SOAJ
2.4000 mg | SUBCUTANEOUS | 1 refills | Status: DC
  Filled 2022-03-13: qty 9, fill #0

## 2022-03-13 MED ORDER — SEMAGLUTIDE-WEIGHT MANAGEMENT 1.7 MG/0.75ML ~~LOC~~ SOAJ
1.7000 mg | SUBCUTANEOUS | 0 refills | Status: DC
  Filled 2022-03-13: qty 3, 28d supply, fill #0

## 2022-03-13 NOTE — Progress Notes (Signed)
BP 107/70   Pulse 65   Temp 97.7 F (36.5 C)   Wt 196 lb (88.9 kg)   BMI 32.62 kg/m    Subjective:    Patient ID: Emma Rogers, female    DOB: 17-Aug-1966, 56 y.o.   MRN: [id number]  HPI: Emma Rogers is a 56 y.o. female  Chief Complaint  Patient presents with   Insomnia    Patient states she is not able to sleep, even when taking trazadone. Patient states she wakes up every hour.    Weight Loss    Patient states she is out of saxenda but doesn't feel like she has lost enough weight while being on it.    Depression   OBESITY Duration: chronic Previous attempts at weight loss: yes Complications of obesity: HLD, GERD, depression Peak weight: 213 Weight loss goal: to be healthy Weight loss to date: 17lbs Requesting obesity pharmacotherapy: yes Current weight loss supplements/medications: yes Previous weight loss supplements/meds: yes  DEPRESSION- stopped the cymbalta after about 2 weeks because she felt loopy.  Mood status: better Satisfied with current treatment?: yes Symptom severity: moderate  Duration of current treatment : chronic Side effects: no Medication compliance: fair compliance Psychotherapy/counseling: no  Previous psychiatric medications: wellbutrin Depressed mood: yes Anxious mood: yes Anhedonia: no Significant weight loss or gain: no Insomnia: yes hard to fall asleep Fatigue: yes Feelings of worthlessness or guilt: no Impaired concentration/indecisiveness: no Suicidal ideations: no Hopelessness: no Crying spells: no    03/13/2022    3:39 PM 02/20/2022    4:25 PM 12/24/2021    3:48 PM 11/26/2021    1:37 PM 10/29/2021    2:53 PM  Depression screen PHQ 2/9  Decreased Interest 0 0 0 0 0  Down, Depressed, Hopeless 0 0 1 0 0  PHQ - 2 Score 0 0 1 0 0  Altered sleeping 3 2 2 2 3   Tired, decreased energy 3 2 3 2 2   Change in appetite 0 0 0 0 2  Feeling bad or failure about yourself  0 0 0 0 0  Trouble concentrating 1 0 1 0 0  Moving slowly or  fidgety/restless 0 0 0 0 0  Suicidal thoughts 0 0 0 0 0  PHQ-9 Score 7 4 7 4 7   Difficult doing work/chores Not difficult at all Not difficult at all Somewhat difficult  Not difficult at all   INSOMNIA Duration: chronic Satisfied with sleep quality: no Difficulty falling asleep: yes Difficulty staying asleep: yes Waking a few hours after sleep onset: yes Early morning awakenings: no Daytime hypersomnolence: no Wakes feeling refreshed: no Good sleep hygiene: yes Apnea: no Snoring: no Depressed/anxious mood: yes Recent stress: no Restless legs/nocturnal leg cramps: no Chronic pain/arthritis: yes History of sleep study: yes Treatments attempted:  trazadone, melatonin, uinsom, and benadryl     Relevant past medical, surgical, family and social history reviewed and updated as indicated. Interim medical history since our last visit reviewed. Allergies and medications reviewed and updated.  Review of Systems  Constitutional: Negative.   Respiratory: Negative.    Cardiovascular: Negative.   Gastrointestinal: Negative.   Musculoskeletal: Negative.   Psychiatric/Behavioral:  Positive for sleep disturbance. Negative for agitation, behavioral problems, confusion, decreased concentration, dysphoric mood, hallucinations, self-injury and suicidal ideas. The patient is not nervous/anxious and is not hyperactive.     Per HPI unless specifically indicated above     Objective:    BP 107/70   Pulse 65   Temp 97.7 F (36.5 C)  Wt 196 lb (88.9 kg)   BMI 32.62 kg/m   Wt Readings from Last 3 Encounters:  03/13/22 196 lb (88.9 kg)  03/04/22 195 lb 9.6 oz (88.7 kg)  02/21/22 194 lb 0.1 oz (88 kg)    Physical Exam Vitals and nursing note reviewed.  Constitutional:      General: She is not in acute distress.    Appearance: Normal appearance. She is not ill-appearing, toxic-appearing or diaphoretic.  HENT:     Head: Normocephalic and atraumatic.     Right Ear: External ear normal.      Left Ear: External ear normal.     Nose: Nose normal.     Mouth/Throat:     Mouth: Mucous membranes are moist.     Pharynx: Oropharynx is clear.  Eyes:     General: No scleral icterus.       Right eye: No discharge.        Left eye: No discharge.     Extraocular Movements: Extraocular movements intact.     Conjunctiva/sclera: Conjunctivae normal.     Pupils: Pupils are equal, round, and reactive to light.  Cardiovascular:     Rate and Rhythm: Normal rate and regular rhythm.     Pulses: Normal pulses.     Heart sounds: Normal heart sounds. No murmur heard.    No friction rub. No gallop.  Pulmonary:     Effort: Pulmonary effort is normal. No respiratory distress.     Breath sounds: Normal breath sounds. No stridor. No wheezing, rhonchi or rales.  Chest:     Chest wall: No tenderness.  Musculoskeletal:        General: Normal range of motion.     Cervical back: Normal range of motion and neck supple.  Skin:    General: Skin is warm and dry.     Capillary Refill: Capillary refill takes less than 2 seconds.     Coloration: Skin is not jaundiced or pale.     Findings: No bruising, erythema, lesion or rash.  Neurological:     General: No focal deficit present.     Mental Status: She is alert and oriented to person, place, and time. Mental status is at baseline.  Psychiatric:        Mood and Affect: Mood normal.        Behavior: Behavior normal.        Thought Content: Thought content normal.        Judgment: Judgment normal.     Results for orders placed or performed during the hospital encounter of 02/21/22  Urinalysis, Routine w reflex microscopic  Result Value Ref Range   Color, Urine YELLOW (A) YELLOW   APPearance CLOUDY (A) CLEAR   Specific Gravity, Urine 1.021 1.005 - 1.030   pH 9.0 (H) 5.0 - 8.0   Glucose, UA NEGATIVE NEGATIVE mg/dL   Hgb urine dipstick NEGATIVE NEGATIVE   Bilirubin Urine NEGATIVE NEGATIVE   Ketones, ur NEGATIVE NEGATIVE mg/dL   Protein, ur 30  (A) NEGATIVE mg/dL   Nitrite NEGATIVE NEGATIVE   Leukocytes,Ua NEGATIVE NEGATIVE   RBC / HPF 0-5 0 - 5 RBC/hpf   WBC, UA 0-5 0 - 5 WBC/hpf   Bacteria, UA RARE (A) NONE SEEN   Squamous Epithelial / LPF 0-5 0 - 5   Mucus PRESENT   Basic metabolic panel  Result Value Ref Range   Sodium 138 135 - 145 mmol/L   Potassium 4.2 3.5 - 5.1 mmol/L   Chloride  102 98 - 111 mmol/L   CO2 29 22 - 32 mmol/L   Glucose, Bld 109 (H) 70 - 99 mg/dL   BUN 17 6 - 20 mg/dL   Creatinine, Ser 8.52 0.44 - 1.00 mg/dL   Calcium 9.4 8.9 - 77.8 mg/dL   GFR, Estimated >24 >23 mL/min   Anion gap 7 5 - 15  CBC  Result Value Ref Range   WBC 6.9 4.0 - 10.5 K/uL   RBC 4.61 3.87 - 5.11 MIL/uL   Hemoglobin 14.3 12.0 - 15.0 g/dL   HCT 53.6 14.4 - 31.5 %   MCV 92.4 80.0 - 100.0 fL   MCH 31.0 26.0 - 34.0 pg   MCHC 33.6 30.0 - 36.0 g/dL   RDW 40.0 86.7 - 61.9 %   Platelets 329 150 - 400 K/uL   nRBC 0.0 0.0 - 0.2 %  POC urine preg, ED (not at [facility])  Result Value Ref Range   Preg Test, Ur NEGATIVE NEGATIVE      Assessment & Plan:   Problem List Items Addressed This Visit       Other   Insomnia    Not doing well. Will start her on nortriptyline and try to get belsomra approved. Continue to monitor. Call with any concerns.       MDD (major depressive disorder), recurrent episode, moderate (HCC)    Did not tolerate the cymbalta. Stopped it. Feels like her mood is doing well with out it. Will hold on restarting anything else right now.       Relevant Medications   nortriptyline (PAMELOR) 25 MG capsule   Class 1 obesity due to excess calories with serious comorbidity and body mass index (BMI) of 32.0 to 32.9 in adult    Feels like she has not been losing enough weight on the saxenda. Will switch her to wegovy and recheck 3 months. Call with any concerns.       Relevant Medications   Semaglutide-Weight Management 0.25 MG/0.5ML SOAJ   Semaglutide-Weight Management 0.5 MG/0.5ML SOAJ (Start on 04/11/2022)    Semaglutide-Weight Management 1 MG/0.5ML SOAJ (Start on 05/10/2022)   Semaglutide-Weight Management 1.7 MG/0.75ML SOAJ (Start on 06/08/2022)   Semaglutide-Weight Management 2.4 MG/0.75ML SOAJ (Start on 07/07/2022)     Follow up plan: Return in about 3 months (around 06/13/2022).

## 2022-03-13 NOTE — Assessment & Plan Note (Signed)
Not doing well. Will start her on nortriptyline and try to get belsomra approved. Continue to monitor. Call with any concerns.

## 2022-03-13 NOTE — Assessment & Plan Note (Signed)
Feels like she has not been losing enough weight on the saxenda. Will switch her to wegovy and recheck 3 months. Call with any concerns.

## 2022-03-13 NOTE — Assessment & Plan Note (Signed)
Did not tolerate the cymbalta. Stopped it. Feels like her mood is doing well with out it. Will hold on restarting anything else right now.

## 2022-03-14 ENCOUNTER — Other Ambulatory Visit (HOSPITAL_COMMUNITY): Payer: Self-pay

## 2022-03-14 ENCOUNTER — Other Ambulatory Visit: Payer: Self-pay

## 2022-03-17 ENCOUNTER — Other Ambulatory Visit: Payer: Self-pay

## 2022-03-17 ENCOUNTER — Other Ambulatory Visit (HOSPITAL_COMMUNITY): Payer: Self-pay

## 2022-03-17 NOTE — Telephone Encounter (Signed)
Medication was shipped to patient's home address on 03/13/22.  Rx was processed in [facility] and the patient has no copay at this time.

## 2022-03-19 ENCOUNTER — Other Ambulatory Visit: Payer: Self-pay

## 2022-03-20 ENCOUNTER — Ambulatory Visit: Payer: No Typology Code available for payment source | Admitting: Family Medicine

## 2022-03-21 ENCOUNTER — Other Ambulatory Visit: Payer: Self-pay

## 2022-03-21 NOTE — Telephone Encounter (Signed)
Yes I'm OK with this, not sure if I need to send in a new Rx

## 2022-03-31 ENCOUNTER — Other Ambulatory Visit: Payer: Self-pay

## 2022-04-02 ENCOUNTER — Other Ambulatory Visit (HOSPITAL_COMMUNITY): Payer: Self-pay

## 2022-04-03 ENCOUNTER — Other Ambulatory Visit: Payer: Self-pay

## 2022-04-04 ENCOUNTER — Other Ambulatory Visit: Payer: Self-pay

## 2022-04-07 ENCOUNTER — Other Ambulatory Visit (HOSPITAL_COMMUNITY): Payer: Self-pay

## 2022-04-08 ENCOUNTER — Other Ambulatory Visit: Payer: Self-pay | Admitting: Family Medicine

## 2022-04-08 ENCOUNTER — Other Ambulatory Visit: Payer: Self-pay

## 2022-04-09 NOTE — Telephone Encounter (Signed)
Requested medication (s) are due for refill today - no  Requested medication (s) are on the active medication list -yes  Future visit scheduled -yes  Last refill: 04/08/22  Notes to clinic: Duplicate request- stepped dosing Rx in list through 10/23  Requested Prescriptions  Pending Prescriptions Disp Refills   SAXENDA 18 MG/3ML SOPN [Pharmacy Med Name: Liraglutide -Weight Management (SAXENDA) 18 MG/3ML Solution Pen-injector] 45 mL 1    Sig: Inject 3 mg into the skin daily.     Endocrinology:  Diabetes - GLP-1 Receptor Agonists Passed - 04/08/2022  5:46 PM      Passed - HBA1C is between 0 and 7.9 and within 180 days    HB A1C (BAYER DCA - WAIVED)  Date Value Ref Range Status  12/24/2021 5.1 4.8 - 5.6 % Final    Comment:             Prediabetes: 5.7 - 6.4          Diabetes: >6.4          Glycemic control for adults with diabetes: <7.0          Passed - Valid encounter within last 6 months    Recent Outpatient Visits           3 weeks ago MDD (major depressive disorder), recurrent episode, moderate (HCC)   [facility] [place], Megan P, DO   4 weeks ago Encounter for medication management   [facility] [place], Cornelius Moras, RPH-CPP   1 month ago Dysuria   [facility] [place], Avanti, MD   3 months ago Routine general medical examination at a health care facility   [facility], Megan P, DO   4 months ago Gastroesophageal reflux disease without esophagitis   [facility] [place], Oralia Rud, DO       Future Appointments             In 1 month [facility], [place], MD [facility]   In 2 months Rice, Jamesetta [place], MD [facility]   In 2 months [place], Oralia Rud, DO [facility], [facility]               Requested Prescriptions  Pending Prescriptions Disp Refills   SAXENDA 18 MG/3ML SOPN [Pharmacy Med Name: Liraglutide -Weight Management (SAXENDA) 18 MG/3ML  Solution Pen-injector] 45 mL 1    Sig: Inject 3 mg into the skin daily.     Endocrinology:  Diabetes - GLP-1 Receptor Agonists Passed - 04/08/2022  5:46 PM      Passed - HBA1C is between 0 and 7.9 and within 180 days    HB A1C (BAYER DCA - WAIVED)  Date Value Ref Range Status  12/24/2021 5.1 4.8 - 5.6 % Final    Comment:             Prediabetes: 5.7 - 6.4          Diabetes: >6.4          Glycemic control for adults with diabetes: <7.0          Passed - Valid encounter within last 6 months    Recent Outpatient Visits           3 weeks ago MDD (major depressive disorder), recurrent episode, moderate (HCC)   [facility] [place], Megan P, DO   4 weeks ago Encounter for medication management   [facility] [place],  Cornelius Moras, RPH-CPP   1 month ago Dysuria   [facility] V[place], Avanti, MD   3 months ago Routine general medical examination at a health care facility   [facility], [place] P, DO   4 months ago Gastroesophageal reflux disease without esophagitis   [facility] Dorcas Carrow, DO       Future Appointments             In 1 month [facility], [place], MD [facility]   In 2 months Rice, Jamesetta Orleans, MD [facility]   In 2 months [place]ural [place], Oralia Rud, DO [facility], [facility]

## 2022-04-10 ENCOUNTER — Other Ambulatory Visit: Payer: Self-pay

## 2022-04-12 ENCOUNTER — Other Ambulatory Visit: Payer: Self-pay | Admitting: Internal Medicine

## 2022-04-12 ENCOUNTER — Other Ambulatory Visit: Payer: Self-pay

## 2022-04-12 DIAGNOSIS — L4050 Arthropathic psoriasis, unspecified: Secondary | ICD-10-CM

## 2022-04-12 DIAGNOSIS — M47819 Spondylosis without myelopathy or radiculopathy, site unspecified: Secondary | ICD-10-CM

## 2022-04-13 MED FILL — Cyclobenzaprine HCl Tab 5 MG: ORAL | 30 days supply | Qty: 30 | Fill #0 | Status: AC

## 2022-04-14 ENCOUNTER — Other Ambulatory Visit: Payer: Self-pay

## 2022-04-24 ENCOUNTER — Other Ambulatory Visit: Payer: Self-pay

## 2022-05-01 ENCOUNTER — Other Ambulatory Visit (HOSPITAL_COMMUNITY): Payer: Self-pay

## 2022-05-02 ENCOUNTER — Other Ambulatory Visit (HOSPITAL_COMMUNITY): Payer: Self-pay

## 2022-05-07 ENCOUNTER — Other Ambulatory Visit (HOSPITAL_COMMUNITY): Payer: Self-pay

## 2022-05-12 ENCOUNTER — Other Ambulatory Visit (HOSPITAL_COMMUNITY): Payer: Self-pay

## 2022-05-12 ENCOUNTER — Other Ambulatory Visit: Payer: Self-pay

## 2022-05-16 ENCOUNTER — Other Ambulatory Visit: Payer: Self-pay

## 2022-05-16 ENCOUNTER — Encounter: Payer: Self-pay | Admitting: Family Medicine

## 2022-05-16 ENCOUNTER — Ambulatory Visit: Payer: Self-pay | Admitting: *Deleted

## 2022-05-16 ENCOUNTER — Telehealth (INDEPENDENT_AMBULATORY_CARE_PROVIDER_SITE_OTHER): Payer: No Typology Code available for payment source | Admitting: Family Medicine

## 2022-05-16 VITALS — HR 83 | Wt 193.0 lb

## 2022-05-16 DIAGNOSIS — U071 COVID-19: Secondary | ICD-10-CM

## 2022-05-16 MED ORDER — ALBUTEROL SULFATE HFA 108 (90 BASE) MCG/ACT IN AERS
2.0000 | INHALATION_SPRAY | Freq: Four times a day (QID) | RESPIRATORY_TRACT | 2 refills | Status: DC | PRN
  Filled 2022-05-16: qty 18, 25d supply, fill #0
  Filled 2022-07-18: qty 6.7, 25d supply, fill #1

## 2022-05-16 MED ORDER — MOLNUPIRAVIR EUA 200MG CAPSULE
4.0000 | ORAL_CAPSULE | Freq: Two times a day (BID) | ORAL | 0 refills | Status: AC
Start: 2022-05-16 — End: 2022-05-21
  Filled 2022-05-16: qty 40, 5d supply, fill #0

## 2022-05-16 MED ORDER — PREDNISONE 50 MG PO TABS
50.0000 mg | ORAL_TABLET | Freq: Every day | ORAL | 0 refills | Status: DC
  Filled 2022-05-16: qty 5, 5d supply, fill #0

## 2022-05-16 NOTE — Telephone Encounter (Signed)
Summary: COVID Advise   Pt is calling to report that she tested positive for COVID last night. Temperature of 102 last night. With temperature today of 100. Congestion, & headache. Please advise     Attempted to call patient regarding symptoms - left message to call office

## 2022-05-16 NOTE — Telephone Encounter (Signed)
Message from Emma Rogers sent at 05/16/2022  8:27 AM EDT  Summary: COVID Advise   Pt is calling to report that she tested positive for COVID last night. Temperature of 102 last night. With temperature today of 100. Congestion, & headache. Please advise          Chief Complaint: fatigue and headache (covid positive) sx onset Wed Symptoms: cough, fever, sore throat, occasional wheezing Frequency: sx started Wed Pertinent Negatives: Patient denies SOB Disposition: [] ED /[] Urgent Care (no appt availability in office) / [x] Appointment(In office/virtual)/ []  [facility] [facility]/ [] Home Care/ [] Refused Recommended Disposition /[] [facility]/ []  Follow-up with PCP Additional Notes: virtual appt today- needs refill Albuterol  Reason for Disposition  [1] HIGH RISK patient (e.g., weak immune system, age > 64 years, obesity with BMI 30 or higher, pregnant, chronic lung disease or other chronic medical condition) AND [2] COVID symptoms (e.g., cough, fever)  (Exceptions: Already seen by PCP and no new or worsening symptoms.)  Answer Assessment - Initial Assessment Questions 1. COVID-19 DIAGNOSIS: "How do you know that you have COVID?" (e.g., positive lab test or self-test, diagnosed by doctor or NP/PA, symptoms after exposure).     Self test 2. COVID-19 EXPOSURE: "Was there any known exposure to COVID before the symptoms began?" CDC Definition of close contact: within 6 feet (2 meters) for a total of 15 minutes or more over a 24-hour period.      no 3. ONSET: "When did the COVID-19 symptoms start?"      Sx started Wednesday  4. WORST SYMPTOM: "What is your worst symptom?" (e.g., cough, fever, shortness of breath, muscle aches)     Fatigue and headache 5. COUGH: "Do you have a cough?" If Yes, ask: "How bad is the cough?"       Yes occasional nagging cough dry 6. FEVER: "Do you have a fever?" If Yes, ask: "What is your temperature, how was it measured, and when did it start?"      Yes-100 7. RESPIRATORY STATUS: "Describe your breathing?" (e.g., normal; shortness of breath, wheezing, unable to speak)      wheezing 8. BETTER-SAME-WORSE: "Are you getting better, staying the same or getting worse compared to yesterday?"  If getting worse, ask, "In what way?"     worse 9. OTHER SYMPTOMS: "Do you have any other symptoms?"  (e.g., chills, fatigue, headache, loss of smell or taste, muscle pain, sore throat)     Fatigue, headache, cough, fever, sore throat 10. HIGH RISK DISEASE: "Do you have any chronic medical problems?" (e.g., asthma, heart or lung disease, weak immune system, obesity, etc.)       Asthma, psoriatic arthritis and CKD 11. VACCINE: "Have you had the COVID-19 vaccine?" If Yes, ask: "Which one, how many shots, when did you get it?"       Yes- works for 12. PREGNANCY: "Is there any chance you are pregnant?" "When was your last menstrual period?"       N/a 13. O2 SATURATION MONITOR:  "Do you use an oxygen saturation monitor (pulse oximeter) at home?" If Yes, ask "What is your reading (oxygen level) today?" "What is your usual oxygen saturation reading?" (e.g., 95%)       95  Protocols used: Coronavirus (COVID-19) Diagnosed or Suspected-A-AH

## 2022-05-16 NOTE — Progress Notes (Signed)
Pulse 83   Wt 193 lb (87.5 kg)   SpO2 95%   BMI 32.12 kg/m    Subjective:    Patient ID: Emma Rogers, female    DOB: 1966-05-25, 55 y.o.   MRN: [id number]  HPI: Emma Rogers is a 56 y.o. female  Chief Complaint  Patient presents with   Covid Positive    Patient states she tested positive for COVID yesterday. Patient has a fever, scratchy throat, headache, body aches and fatigue. Symptoms began yesterday.    UPPER RESPIRATORY TRACT INFECTION Duration: 2 days Worst symptom: sore throat, cough and body aches Fever: yes Cough: yes Shortness of breath: no Wheezing: yes Chest pain: no Chest tightness: no Chest congestion: no Nasal congestion: yes Runny nose: yes Post nasal drip: yes Sneezing: no Sore throat: no Swollen glands: no Sinus pressure: yes Headache: yes Face pain: yes Toothache: no Ear pain: no  Ear pressure: yes  Eyes red/itching:no Eye drainage/crusting: no  Vomiting: no Rash: no Fatigue: yes Sick contacts: yes Strep contacts: no  Context: worse Recurrent sinusitis: no Relief with OTC cold/cough medications: no  Treatments attempted: none   Relevant past medical, surgical, family and social history reviewed and updated as indicated. Interim medical history since our last visit reviewed. Allergies and medications reviewed and updated.  Review of Systems  Constitutional:  Positive for fatigue and fever. Negative for activity change, appetite change, chills, diaphoresis and unexpected weight change.  Respiratory: Negative.    Cardiovascular: Negative.   Gastrointestinal: Negative.   Genitourinary: Negative.   Musculoskeletal: Negative.   Neurological:  Positive for headaches. Negative for dizziness, tremors, seizures, syncope, facial asymmetry, speech difficulty, weakness, light-headedness and numbness.  Hematological: Negative.   Psychiatric/Behavioral: Negative.      Per HPI unless specifically indicated above     Objective:    Pulse  83   Wt 193 lb (87.5 kg)   SpO2 95%   BMI 32.12 kg/m   Wt Readings from Last 3 Encounters:  05/16/22 193 lb (87.5 kg)  03/13/22 196 lb (88.9 kg)  03/04/22 195 lb 9.6 oz (88.7 kg)    Physical Exam Vitals and nursing note reviewed.  Constitutional:      General: She is not in acute distress.    Appearance: Normal appearance. She is normal weight. She is not ill-appearing, toxic-appearing or diaphoretic.  HENT:     Head: Normocephalic and atraumatic.     Right Ear: External ear normal.     Left Ear: External ear normal.     Nose: Nose normal.     Mouth/Throat:     Mouth: Mucous membranes are moist.     Pharynx: Oropharynx is clear.  Eyes:     General: No scleral icterus.       Right eye: No discharge.        Left eye: No discharge.     Conjunctiva/sclera: Conjunctivae normal.     Pupils: Pupils are equal, round, and reactive to light.  Pulmonary:     Effort: Pulmonary effort is normal. No respiratory distress.     Comments: Speaking in full sentences Musculoskeletal:        General: Normal range of motion.     Cervical back: Normal range of motion.  Skin:    Coloration: Skin is not jaundiced or pale.     Findings: No bruising, erythema, lesion or rash.  Neurological:     Mental Status: She is alert and oriented to person, place, and time. Mental status is  at baseline.  Psychiatric:        Mood and Affect: Mood normal.        Behavior: Behavior normal.        Thought Content: Thought content normal.        Judgment: Judgment normal.     Results for orders placed or performed during the hospital encounter of 02/21/22  Urinalysis, Routine w reflex microscopic  Result Value Ref Range   Color, Urine YELLOW (A) YELLOW   APPearance CLOUDY (A) CLEAR   Specific Gravity, Urine 1.021 1.005 - 1.030   pH 9.0 (H) 5.0 - 8.0   Glucose, UA NEGATIVE NEGATIVE mg/dL   Hgb urine dipstick NEGATIVE NEGATIVE   Bilirubin Urine NEGATIVE NEGATIVE   Ketones, ur NEGATIVE NEGATIVE mg/dL    Protein, ur 30 (A) NEGATIVE mg/dL   Nitrite NEGATIVE NEGATIVE   Leukocytes,Ua NEGATIVE NEGATIVE   RBC / HPF 0-5 0 - 5 RBC/hpf   WBC, UA 0-5 0 - 5 WBC/hpf   Bacteria, UA RARE (A) NONE SEEN   Squamous Epithelial / LPF 0-5 0 - 5   Mucus PRESENT   Basic metabolic panel  Result Value Ref Range   Sodium 138 135 - 145 mmol/L   Potassium 4.2 3.5 - 5.1 mmol/L   Chloride 102 98 - 111 mmol/L   CO2 29 22 - 32 mmol/L   Glucose, Bld 109 (H) 70 - 99 mg/dL   BUN 17 6 - 20 mg/dL   Creatinine, Ser 0.89 0.44 - 1.00 mg/dL   Calcium 9.4 8.9 - 10.3 mg/dL   GFR, Estimated >60 >60 mL/min   Anion gap 7 5 - 15  CBC  Result Value Ref Range   WBC 6.9 4.0 - 10.5 K/uL   RBC 4.61 3.87 - 5.11 MIL/uL   Hemoglobin 14.3 12.0 - 15.0 g/dL   HCT 42.6 36.0 - 46.0 %   MCV 92.4 80.0 - 100.0 fL   MCH 31.0 26.0 - 34.0 pg   MCHC 33.6 30.0 - 36.0 g/dL   RDW 12.8 11.5 - 15.5 %   Platelets 329 150 - 400 K/uL   nRBC 0.0 0.0 - 0.2 %  POC urine preg, ED (not at [facility])  Result Value Ref Range   Preg Test, Ur NEGATIVE NEGATIVE      Assessment & Plan:   Problem List Items Addressed This Visit   None Visit Diagnoses     COVID    -  Primary   Will treat with prednisone and molnopirovir. Call if not getting better or getting worse. Continue to monitor.    Relevant Medications   molnupiravir EUA (LAGEVRIO) 200 mg CAPS capsule        Follow up plan: Return if symptoms worsen or fail to improve.    This visit was completed via video visit through MyChart due to the restrictions of the COVID-19 pandemic. All issues as above were discussed and addressed. Physical exam was done as above through visual confirmation on video through MyChart. If it was felt that the patient should be evaluated in the office, they were directed there. The patient verbally consented to this visit. Location of the patient: home Location of the provider: work Those involved with this call:  Provider: Park Liter, DO CMA: Louanna Raw,  [place] Desk/Registration: [employer]  Time spent on call:  15 minutes with patient face to face via video conference. More than 50% of this time was spent in counseling and coordination of care. 23 minutes  total spent in review of patient's record and preparation of their chart.

## 2022-05-16 NOTE — Telephone Encounter (Signed)
Requested Prescriptions  Pending Prescriptions Disp Refills  . albuterol (VENTOLIN HFA) 108 (90 Base) MCG/ACT inhaler 18 g 2    Sig: INHALE 2 PUFFS INTO THE LUNGS EVERY 6 (SIX) HOURS AS NEEDED FOR WHEEZING OR SHORTNESS OF BREATH.     Pulmonology:  Beta Agonists 2 Passed - 05/16/2022  9:05 AM      Passed - Last BP in normal range    BP Readings from Last 1 Encounters:  03/13/22 107/70         Passed - Last Heart Rate in normal range    Pulse Readings from Last 1 Encounters:  03/13/22 65         Passed - Valid encounter within last 12 months    Recent Outpatient Visits          2 months ago MDD (major depressive disorder), recurrent episode, moderate (HCC)   [facility] [place], Megan P, DO   2 months ago Encounter for medication management   [facility] [place], Cornelius Moras, RPH-CPP   2 months ago Dysuria   [facility] Vigg, Avanti, MD   4 months ago Routine general medical examination at a health care facility   [facility], Megan P, DO   5 months ago Gastroesophageal reflux disease without esophagitis   [facility] [place], Oralia Rud, DO      Future Appointments            Today Dorcas Carrow, DO [facility], [facility]   In 1 week [facility] [place], MD [facility]   In 3 weeks Rice, Jamesetta [place], MD [facility]   In 4 weeks Laural Benes, Oralia Rud, DO [employer] [facility], [facility]

## 2022-05-16 NOTE — Telephone Encounter (Signed)
Pt requests refill of Albuterol inhaler

## 2022-05-19 ENCOUNTER — Other Ambulatory Visit (HOSPITAL_COMMUNITY): Payer: Self-pay

## 2022-05-22 ENCOUNTER — Other Ambulatory Visit: Payer: Self-pay

## 2022-05-27 ENCOUNTER — Other Ambulatory Visit (HOSPITAL_COMMUNITY): Payer: Self-pay

## 2022-05-28 ENCOUNTER — Ambulatory Visit: Payer: No Typology Code available for payment source | Admitting: Dermatology
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
## 2022-05-30 ENCOUNTER — Other Ambulatory Visit (HOSPITAL_COMMUNITY): Payer: Self-pay

## 2022-06-02 ENCOUNTER — Other Ambulatory Visit (HOSPITAL_COMMUNITY): Payer: Self-pay

## 2022-06-03 ENCOUNTER — Encounter: Payer: Self-pay | Admitting: Family Medicine

## 2022-06-03 NOTE — Telephone Encounter (Signed)
Patient scheduled tomorrow @ 3 with Clydie Braun

## 2022-06-04 ENCOUNTER — Other Ambulatory Visit: Payer: Self-pay

## 2022-06-04 ENCOUNTER — Encounter: Payer: Self-pay | Admitting: Nurse Practitioner

## 2022-06-04 ENCOUNTER — Ambulatory Visit (INDEPENDENT_AMBULATORY_CARE_PROVIDER_SITE_OTHER): Payer: No Typology Code available for payment source | Admitting: Nurse Practitioner

## 2022-06-04 VITALS — BP 118/82 | HR 78 | Temp 98.3°F | Wt 193.9 lb

## 2022-06-04 DIAGNOSIS — F411 Generalized anxiety disorder: Secondary | ICD-10-CM

## 2022-06-04 DIAGNOSIS — F331 Major depressive disorder, recurrent, moderate: Secondary | ICD-10-CM | POA: Diagnosis not present

## 2022-06-04 MED ORDER — TRAZODONE HCL 100 MG PO TABS
100.0000 mg | ORAL_TABLET | Freq: Every day | ORAL | 0 refills | Status: DC
  Filled 2022-06-04: qty 90, 90d supply, fill #0

## 2022-06-04 MED ORDER — BUPROPION HCL ER (XL) 150 MG PO TB24
150.0000 mg | ORAL_TABLET | Freq: Every day | ORAL | 0 refills | Status: DC
  Filled 2022-06-04: qty 90, 90d supply, fill #0

## 2022-06-04 NOTE — Assessment & Plan Note (Signed)
Chronic. Not well controlled.  Not sleeping well likely due to stress at work.  Will increase Wellbutrin to 450mg  daily.  Will also change Belsomra back to Trazodone and increase dose from 150mg  200mg .  Follow up in 2 weeks for reevaluation.  Call Sooner if concerns arise.

## 2022-06-04 NOTE — Assessment & Plan Note (Signed)
Chronic. Not well controlled.  Not sleeping well likely due to stress at work.  Will increase Wellbutrin to 450mg daily.  Will also change Belsomra back to Trazodone and increase dose from 150mg 200mg.  Follow up in 2 weeks for reevaluation.  Call Sooner if concerns arise. 

## 2022-06-04 NOTE — Progress Notes (Signed)
BP 118/82   Pulse 78   Temp 98.3 F (36.8 C) (Oral)   Wt 193 lb 14.4 oz (88 kg)   SpO2 98%   BMI 32.27 kg/m    Subjective:    Patient ID: Emma Rogers, female    DOB: August 31, 1966, 56 y.o.   MRN: [id number]  HPI: Emma Rogers is a 56 y.o. female  Chief Complaint  Patient presents with   Anxiety    Patient reports under tremendous amount of work, which is causing patient to wake up every 2-3 hours. Pt feels very exhausted.    ANXIETY/STRESS Mostly around work.  Has been worse for about the past couple of months.  Duration: uncontrolled Anxious mood: yes  Excessive worrying: yes Irritability: yes Sweating: no Nausea: no Palpitations:yes Hyperventilation: no Panic attacks: no Agoraphobia: no  Obscessions/compulsions: no Depressed mood: yes    06/04/2022    3:06 PM 03/13/2022    3:39 PM 02/20/2022    4:25 PM 12/24/2021    3:48 PM 11/26/2021    1:37 PM  Depression screen PHQ 2/9  Decreased Interest 0 0 0 0 0  Down, Depressed, Hopeless 2 0 0 1 0  PHQ - 2 Score 2 0 0 1 0  Altered sleeping 3 3 2 2 2   Tired, decreased energy 3 3 2 3 2   Change in appetite 1 0 0 0 0  Feeling bad or failure about yourself  1 0 0 0 0  Trouble concentrating 3 1 0 1 0  Moving slowly or fidgety/restless 0 0 0 0 0  Suicidal thoughts 0 0 0 0 0  PHQ-9 Score 13 7 4 7 4   Difficult doing work/chores Somewhat difficult Not difficult at all Not difficult at all Somewhat difficult    Anhedonia: no Weight changes: no Insomnia: yes hard to fall asleep  Hypersomnia: no Fatigue/loss of energy: yes Recent Stressors/Life Changes: yes   Job stress: yes  Patient states the Belsomra made her sleepy but then she woke up feeling panicky.  Went back to taking Trazodone which helped but then recently hasn't been helping.    Relevant past medical, surgical, family and social history reviewed and updated as indicated. Interim medical history since our last visit reviewed. Allergies and medications reviewed and  updated.  Review of Systems  Psychiatric/Behavioral:  Positive for dysphoric mood and sleep disturbance. Negative for suicidal ideas. The patient is nervous/anxious.     Per HPI unless specifically indicated above     Objective:    BP 118/82   Pulse 78   Temp 98.3 F (36.8 C) (Oral)   Wt 193 lb 14.4 oz (88 kg)   SpO2 98%   BMI 32.27 kg/m   Wt Readings from Last 3 Encounters:  06/04/22 193 lb 14.4 oz (88 kg)  05/16/22 193 lb (87.5 kg)  03/13/22 196 lb (88.9 kg)    Physical Exam Vitals and nursing note reviewed.  Constitutional:      General: She is not in acute distress.    Appearance: Normal appearance. She is normal weight. She is not ill-appearing, toxic-appearing or diaphoretic.  HENT:     Head: Normocephalic.     Right Ear: External ear normal.     Left Ear: External ear normal.     Nose: Nose normal.     Mouth/Throat:     Mouth: Mucous membranes are moist.     Pharynx: Oropharynx is clear.  Eyes:     General:  Right eye: No discharge.        Left eye: No discharge.     Extraocular Movements: Extraocular movements intact.     Conjunctiva/sclera: Conjunctivae normal.     Pupils: Pupils are equal, round, and reactive to light.  Cardiovascular:     Rate and Rhythm: Normal rate and regular rhythm.     Heart sounds: No murmur heard. Pulmonary:     Effort: Pulmonary effort is normal. No respiratory distress.     Breath sounds: Normal breath sounds. No wheezing or rales.  Musculoskeletal:     Cervical back: Normal range of motion and neck supple.  Skin:    General: Skin is warm and dry.     Capillary Refill: Capillary refill takes less than 2 seconds.  Neurological:     General: No focal deficit present.     Mental Status: She is alert and oriented to person, place, and time. Mental status is at baseline.  Psychiatric:        Mood and Affect: Mood normal.        Behavior: Behavior normal.        Thought Content: Thought content normal.        Judgment:  Judgment normal.     Results for orders placed or performed during the hospital encounter of 02/21/22  Urinalysis, Routine w reflex microscopic  Result Value Ref Range   Color, Urine YELLOW (A) YELLOW   APPearance CLOUDY (A) CLEAR   Specific Gravity, Urine 1.021 1.005 - 1.030   pH 9.0 (H) 5.0 - 8.0   Glucose, UA NEGATIVE NEGATIVE mg/dL   Hgb urine dipstick NEGATIVE NEGATIVE   Bilirubin Urine NEGATIVE NEGATIVE   Ketones, ur NEGATIVE NEGATIVE mg/dL   Protein, ur 30 (A) NEGATIVE mg/dL   Nitrite NEGATIVE NEGATIVE   Leukocytes,Ua NEGATIVE NEGATIVE   RBC / HPF 0-5 0 - 5 RBC/hpf   WBC, UA 0-5 0 - 5 WBC/hpf   Bacteria, UA RARE (A) NONE SEEN   Squamous Epithelial / LPF 0-5 0 - 5   Mucus PRESENT   Basic metabolic panel  Result Value Ref Range   Sodium 138 135 - 145 mmol/L   Potassium 4.2 3.5 - 5.1 mmol/L   Chloride 102 98 - 111 mmol/L   CO2 29 22 - 32 mmol/L   Glucose, Bld 109 (H) 70 - 99 mg/dL   BUN 17 6 - 20 mg/dL   Creatinine, Ser 2.95 0.44 - 1.00 mg/dL   Calcium 9.4 8.9 - 18.8 mg/dL   GFR, Estimated >41 >66 mL/min   Anion gap 7 5 - 15  CBC  Result Value Ref Range   WBC 6.9 4.0 - 10.5 K/uL   RBC 4.61 3.87 - 5.11 MIL/uL   Hemoglobin 14.3 12.0 - 15.0 g/dL   HCT 06.3 01.6 - 01.0 %   MCV 92.4 80.0 - 100.0 fL   MCH 31.0 26.0 - 34.0 pg   MCHC 33.6 30.0 - 36.0 g/dL   RDW 93.2 35.5 - 73.2 %   Platelets 329 150 - 400 K/uL   nRBC 0.0 0.0 - 0.2 %  POC urine preg, ED (not at [facility])  Result Value Ref Range   Preg Test, Ur NEGATIVE NEGATIVE      Assessment & Plan:   Problem List Items Addressed This Visit       Other   GAD (generalized anxiety disorder) - Primary    Chronic. Not well controlled.  Not sleeping well likely due to stress at  work.  Will increase Wellbutrin to 450mg  daily.  Will also change Belsomra back to Trazodone and increase dose from 150mg  200mg .  Follow up in 2 weeks for reevaluation.  Call Sooner if concerns arise.      Relevant Medications   traZODone  (DESYREL) 100 MG tablet   buPROPion (WELLBUTRIN XL) 150 MG 24 hr tablet   MDD (major depressive disorder), recurrent episode, moderate (HCC)    Chronic. Not well controlled.  Not sleeping well likely due to stress at work.  Will increase Wellbutrin to 450mg  daily.  Will also change Belsomra back to Trazodone and increase dose from 150mg  200mg .  Follow up in 2 weeks for reevaluation.  Call Sooner if concerns arise.      Relevant Medications   traZODone (DESYREL) 100 MG tablet   buPROPion (WELLBUTRIN XL) 150 MG 24 hr tablet     Follow up plan: Return in about 1 month (around 07/04/2022) for Anxiety/Stress (virtual).

## 2022-06-05 ENCOUNTER — Other Ambulatory Visit: Payer: Self-pay

## 2022-06-06 ENCOUNTER — Telehealth: Payer: Self-pay | Admitting: Pharmacist

## 2022-06-06 NOTE — Telephone Encounter (Signed)
Patient is due for PA renewal for Tremfya. She has upcoming OV on 06/09/22. Will plan to renew PA after OV note is updated since we do not have updated clinicals since she started Tremfya on 03/04/22  Chesley Mires, PharmD, MPH, BCPS, CPP Clinical Pharmacist (Rheumatology and Pulmonology)

## 2022-06-09 ENCOUNTER — Other Ambulatory Visit (HOSPITAL_COMMUNITY): Payer: Self-pay

## 2022-06-09 ENCOUNTER — Ambulatory Visit: Payer: No Typology Code available for payment source | Attending: Internal Medicine | Admitting: Internal Medicine

## 2022-06-09 DIAGNOSIS — N183 Chronic kidney disease, stage 3 unspecified: Secondary | ICD-10-CM

## 2022-06-09 DIAGNOSIS — Z8669 Personal history of other diseases of the nervous system and sense organs: Secondary | ICD-10-CM

## 2022-06-09 DIAGNOSIS — Z79899 Other long term (current) drug therapy: Secondary | ICD-10-CM

## 2022-06-09 DIAGNOSIS — L4050 Arthropathic psoriasis, unspecified: Secondary | ICD-10-CM

## 2022-06-09 DIAGNOSIS — M47819 Spondylosis without myelopathy or radiculopathy, site unspecified: Secondary | ICD-10-CM

## 2022-06-09 NOTE — Telephone Encounter (Signed)
Prior authorization for Tremfya maintenance dose is in place through 09/21/22  Knox Saliva, PharmD, MPH, BCPS, CPP Clinical Pharmacist (Rheumatology and Pulmonology)

## 2022-06-11 ENCOUNTER — Encounter: Payer: Self-pay | Admitting: Urology

## 2022-06-13 ENCOUNTER — Encounter: Payer: Self-pay | Admitting: Family Medicine

## 2022-06-13 ENCOUNTER — Ambulatory Visit (INDEPENDENT_AMBULATORY_CARE_PROVIDER_SITE_OTHER): Payer: No Typology Code available for payment source | Admitting: Family Medicine

## 2022-06-13 ENCOUNTER — Other Ambulatory Visit: Payer: Self-pay

## 2022-06-13 VITALS — BP 104/73 | HR 71 | Temp 98.2°F | Wt 193.0 lb

## 2022-06-13 DIAGNOSIS — Z6832 Body mass index (BMI) 32.0-32.9, adult: Secondary | ICD-10-CM

## 2022-06-13 DIAGNOSIS — E6609 Other obesity due to excess calories: Secondary | ICD-10-CM

## 2022-06-13 DIAGNOSIS — F331 Major depressive disorder, recurrent, moderate: Secondary | ICD-10-CM

## 2022-06-13 DIAGNOSIS — R8290 Unspecified abnormal findings in urine: Secondary | ICD-10-CM

## 2022-06-13 LAB — URINALYSIS, ROUTINE W REFLEX MICROSCOPIC
Bilirubin, UA: NEGATIVE
Glucose, UA: NEGATIVE
Ketones, UA: NEGATIVE
Leukocytes,UA: NEGATIVE
Nitrite, UA: NEGATIVE
Protein,UA: NEGATIVE
RBC, UA: NEGATIVE
Specific Gravity, UA: 1.02 (ref 1.005–1.030)
Urobilinogen, Ur: 0.2 mg/dL (ref 0.2–1.0)
pH, UA: 5.5 (ref 5.0–7.5)

## 2022-06-13 MED ORDER — TRAZODONE HCL 100 MG PO TABS
200.0000 mg | ORAL_TABLET | Freq: Every day | ORAL | 1 refills | Status: DC
Start: 2022-06-13 — End: 2023-02-20
  Filled 2022-06-13 – 2022-08-04 (×2): qty 180, 90d supply, fill #0
  Filled 2022-11-06: qty 180, 90d supply, fill #1

## 2022-06-13 NOTE — Assessment & Plan Note (Signed)
Continue wellbutrin. Has not been taking wegovy. Continue to monitor. Call with any concerns.

## 2022-06-13 NOTE — Progress Notes (Signed)
BP 104/73   Pulse 71   Temp 98.2 F (36.8 C)   Wt 193 lb (87.5 kg)   SpO2 98%   BMI 32.12 kg/m    Subjective:    Patient ID: Emma Rogers, female    DOB: 1965-10-08, 56 y.o.   MRN: [id number]  HPI: Emma Rogers is a 56 y.o. female  Chief Complaint  Patient presents with   Obesity   Anxiety    Patient states she is feeling a little better in regards to her anxiety, she is able to sleep better.    Depression   Diarrhea    Patient states she is having issues after meals, has loose stools after meals. Patient states she is also having lower abdominal cramping.    cloudy urine    Patient states she noticed her urine was cloudy a few days but not anymore.    WEIGHT GAIN Duration: chronic Previous attempts at weight loss: no Complications of obesity: HLD, GERD, depression Peak weight: 213  Weight loss goal: to be healthy Weight loss to date: 20lbs 11.1% body weight  Requesting obesity pharmacotherapy: yes Current weight loss supplements/medications: yes Previous weight loss supplements/meds: yes  DEPRESSION Mood status: better Satisfied with current treatment?: yes Symptom severity: moderate  Duration of current treatment : chronic Side effects: yes Medication compliance: excellent compliance Psychotherapy/counseling: no  Previous psychiatric medications:  Depressed mood: yes Anxious mood: yes Anhedonia: no Significant weight loss or gain: yes Insomnia: yes hard to fall asleep Fatigue: yes Feelings of worthlessness or guilt: no Impaired concentration/indecisiveness: no Suicidal ideations: no Hopelessness: no Crying spells: no    06/13/2022    3:46 PM 06/04/2022    3:06 PM 03/13/2022    3:39 PM 02/20/2022    4:25 PM 12/24/2021    3:48 PM  Depression screen PHQ 2/9  Decreased Interest 1 0 0 0 0  Down, Depressed, Hopeless 0 2 0 0 1  PHQ - 2 Score 1 2 0 0 1  Altered sleeping 2 3 3 2 2   Tired, decreased energy 3 3 3 2 3   Change in appetite 0 1 0 0 0  Feeling  bad or failure about yourself  0 1 0 0 0  Trouble concentrating 1 3 1  0 1  Moving slowly or fidgety/restless 0 0 0 0 0  Suicidal thoughts 0 0 0 0 0  PHQ-9 Score 7 13 7 4 7   Difficult doing work/chores Somewhat difficult Somewhat difficult Not difficult at all Not difficult at all Somewhat difficult   URINARY SYMPTOMS Duration: about  week ago for 1 day Dysuria: no Urinary frequency: no Urgency: no Small volume voids: no Symptom severity: no Urinary incontinence: no Foul odor: no Hematuria: no Abdominal pain: yes Back pain: no Suprapubic pain/pressure: no Flank pain: no Fever:  no Vomiting: no Relief with cranberry juice: no Relief with pyridium: no Status: better Previous urinary tract infection: yes Recurrent urinary tract infection: no Sexual activity: monogomous History of sexually transmitted disease: no Vaginal discharge: no Treatments attempted: increasing fluids    Relevant past medical, surgical, family and social history reviewed and updated as indicated. Interim medical history since our last visit reviewed. Allergies and medications reviewed and updated.  Review of Systems  Constitutional: Negative.   Respiratory: Negative.    Cardiovascular: Negative.   Gastrointestinal:  Positive for abdominal distention, abdominal pain and diarrhea. Negative for anal bleeding, blood in stool, constipation, nausea, rectal pain and vomiting.  Genitourinary: Negative.   Musculoskeletal: Negative.  Skin: Negative.   Neurological: Negative.   Psychiatric/Behavioral: Negative.      Per HPI unless specifically indicated above     Objective:    BP 104/73   Pulse 71   Temp 98.2 F (36.8 C)   Wt 193 lb (87.5 kg)   SpO2 98%   BMI 32.12 kg/m   Wt Readings from Last 3 Encounters:  06/13/22 193 lb (87.5 kg)  06/04/22 193 lb 14.4 oz (88 kg)  05/16/22 193 lb (87.5 kg)    Physical Exam Vitals and nursing note reviewed.  Constitutional:      General: She is not in  acute distress.    Appearance: Normal appearance. She is obese. She is not ill-appearing, toxic-appearing or diaphoretic.  HENT:     Head: Normocephalic and atraumatic.     Right Ear: External ear normal.     Left Ear: External ear normal.     Nose: Nose normal.     Mouth/Throat:     Mouth: Mucous membranes are moist.     Pharynx: Oropharynx is clear.  Eyes:     General: No scleral icterus.       Right eye: No discharge.        Left eye: No discharge.     Extraocular Movements: Extraocular movements intact.     Conjunctiva/sclera: Conjunctivae normal.     Pupils: Pupils are equal, round, and reactive to light.  Cardiovascular:     Rate and Rhythm: Normal rate and regular rhythm.     Pulses: Normal pulses.     Heart sounds: Normal heart sounds. No murmur heard.    No friction rub. No gallop.  Pulmonary:     Effort: Pulmonary effort is normal. No respiratory distress.     Breath sounds: Normal breath sounds. No stridor. No wheezing, rhonchi or rales.  Chest:     Chest wall: No tenderness.  Musculoskeletal:        General: Normal range of motion.     Cervical back: Normal range of motion and neck supple.  Skin:    General: Skin is warm and dry.     Capillary Refill: Capillary refill takes less than 2 seconds.     Coloration: Skin is not jaundiced or pale.     Findings: No bruising, erythema, lesion or rash.  Neurological:     General: No focal deficit present.     Mental Status: She is alert and oriented to person, place, and time. Mental status is at baseline.  Psychiatric:        Mood and Affect: Mood normal.        Behavior: Behavior normal.        Thought Content: Thought content normal.        Judgment: Judgment normal.     Results for orders placed or performed in visit on 06/13/22  Urinalysis, Routine w reflex microscopic  Result Value Ref Range   Specific Gravity, UA 1.020 1.005 - 1.030   pH, UA 5.5 5.0 - 7.5   Color, UA Yellow Yellow   Appearance Ur Clear  Clear   Leukocytes,UA Negative Negative   Protein,UA Negative Negative/Trace   Glucose, UA Negative Negative   Ketones, UA Negative Negative   RBC, UA Negative Negative   Bilirubin, UA Negative Negative   Urobilinogen, Ur 0.2 0.2 - 1.0 mg/dL   Nitrite, UA Negative Negative      Assessment & Plan:   Problem List Items Addressed This Visit  Other   MDD (major depressive disorder), recurrent episode, moderate ([place])    Doing better. Has been on higher dose for 1 week- will continue current regimen and recheck in 4 weeks. Call with any concerns.       Relevant Medications   traZODone (DESYREL) 100 MG tablet   Class 1 obesity due to excess calories with serious comorbidity and body mass index (BMI) of 32.0 to 32.9 in adult    Continue wellbutrin. Has not been taking wegovy. Continue to monitor. Call with any concerns.       Other Visit Diagnoses     Cloudy urine    -  Primary   UA clear. Continue to monitor. Increase fluids.    Relevant Orders   Urinalysis, Routine w reflex microscopic (Completed)        Follow up plan: Return in about 4 weeks (around 07/11/2022) for virtual OK.

## 2022-06-13 NOTE — Assessment & Plan Note (Signed)
Doing better. Has been on higher dose for 1 week- will continue current regimen and recheck in 4 weeks. Call with any concerns.

## 2022-06-26 ENCOUNTER — Ambulatory Visit (INDEPENDENT_AMBULATORY_CARE_PROVIDER_SITE_OTHER): Payer: No Typology Code available for payment source | Admitting: Physician Assistant

## 2022-06-26 ENCOUNTER — Encounter: Payer: Self-pay | Admitting: Physician Assistant

## 2022-06-26 VITALS — BP 119/72 | HR 70 | Temp 98.5°F | Ht 65.0 in | Wt 200.3 lb

## 2022-06-26 DIAGNOSIS — F411 Generalized anxiety disorder: Secondary | ICD-10-CM | POA: Diagnosis not present

## 2022-06-26 DIAGNOSIS — F331 Major depressive disorder, recurrent, moderate: Secondary | ICD-10-CM

## 2022-06-26 DIAGNOSIS — F458 Other somatoform disorders: Secondary | ICD-10-CM

## 2022-06-26 NOTE — Progress Notes (Signed)
Acute Office Visit   Patient: Emma Rogers   DOB: [birth year]   56 y.o. Female  MRN: [id number] Visit Date: 06/26/2022  Today's healthcare provider: Dani Gobble Lewin Pellow, PA-C  Introduced myself to the patient as a Journalist, newspaper and provided education on APPs in clinical practice.    Chief Complaint  Patient presents with   Stomach issues    Patients states that she is under a lot of stress. Has been having a lot of diarrhea with Cramping every morning and everyday with meals.    Subjective    HPI HPI     Stomach issues    Additional comments: Patients states that she is under a lot of stress. Has been having a lot of diarrhea with Cramping every morning and everyday with meals.       Last edited by Jerelene Redden, CMA on 06/26/2022  9:45 AM.       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 She has taken an Imodium, which helped but the next day she had same symptoms   Diet: she tries to avoid triggers, she has had reduced appetite   She has been trying to lose weight - was using Semaglutide but Mancel [employer]ons has been on backorder  She was not having these symptoms while on Semaglutide -  has been off since April or May  She states that these symptoms don't seem to be as prevalent on the weekends when she is away from stress at work   Medications: Outpatient Medications Prior to Visit  Medication Sig   albuterol (VENTOLIN HFA) 108 (90 Base) MCG/ACT inhaler Inhale 2 puffs into the lungs every 6 (six) hours as needed for wheezing or shortness of breath.   atorvastatin (LIPITOR) 40 MG tablet Take 1 tablet (40 mg total) by mouth at bedtime.   budesonide-formoterol (SYMBICORT) 80-4.5 MCG/ACT inhaler Inhale 2 puffs into the lungs 2 (two) times daily.   buPROPion (WELLBUTRIN XL) 150 MG 24 hr tablet Take 1 tablet (150 mg total) by mouth daily.   buPROPion (WELLBUTRIN XL) 300 MG 24 hr tablet Take 1 tablet (300 mg total) by mouth daily.   cyclobenzaprine (FLEXERIL) 5 MG tablet Take 1 tablet (5 mg total) by mouth at bedtime as needed (Back pain or muscle spasm).   furosemide (LASIX) 20 MG tablet Take 1 tablet (20 mg total) by mouth daily.   Guselkumab (TREMFYA) 100 MG/ML SOPN Inject 100 mg into the skin at week 4 and then every 8 weeks thereafter   Guselkumab (TREMFYA) 100 MG/ML SOPN Inject 100 mg into the skin at week 0  Insulin Pen Needle 31G X 5 MM MISC Use as directed.   omeprazole (PRILOSEC) 20 MG capsule Take 1 capsule (20 mg total) by mouth daily.   rizatriptan (MAXALT) 10 MG tablet Take 1 tablet (10 mg total) by mouth as needed for migraine. May repeat in 2 hours if needed   traZODone (DESYREL) 100 MG tablet Take 2 tablets (200 mg total) by mouth at bedtime.   VITAMIN D PO Take 1 tablet by mouth daily.   Facility-Administered Medications Prior to Visit  Medication Dose Route Frequency Provider   cyanocobalamin ((VITAMIN B-12)) injection 1,000 mcg  1,000 mcg Intramuscular Q30 days Wynetta Emery, Megan P, DO    Review of Systems  Gastrointestinal:  Positive for abdominal pain, diarrhea and nausea. Negative for anal bleeding, blood in stool, constipation, rectal pain and vomiting.        Objective    BP 119/72   Pulse 70   Temp 98.5 F (36.9 C) (Oral)   Ht 5\' 5"  (1.651 m)   Wt 200 lb 4.8 oz (90.9 kg)   SpO2 98%   BMI 33.33 kg/m    Physical Exam Vitals reviewed.  Constitutional:      General: She is awake.     Appearance: Normal appearance. She is well-developed and well-groomed.  HENT:     Head: Normocephalic and atraumatic.  Pulmonary:     Effort: Pulmonary effort is normal.  Musculoskeletal:     Cervical back: Normal range of motion.  Neurological:     General: No focal deficit present.     Mental Status: She is alert and oriented to person, place, and time.     GCS: GCS eye subscore is 4. GCS verbal subscore is 5. GCS motor subscore is 6.     Cranial Nerves: No cranial nerve deficit, dysarthria or facial asymmetry.     Motor: No weakness or tremor.  Psychiatric:        Attention and Perception: Attention and perception normal.        Mood and Affect: Mood and affect normal.        Speech: Speech normal.        Behavior: Behavior normal. Behavior is cooperative.       No results found for any visits on 06/26/22.  Assessment & Plan      No follow-ups on file.      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, ongoing  Patient reports she has a hx dx of IBS and thinks this is related to stress and IBS-D Reviewed conservative management today- avoiding trigger foods and drinks, reducing stress, increasing fiber and using OTC anti-diarrheals PRN, symptom logs for correlation She states she has been under significant stress at work and diarrhea has compounded this She is amenable to therapy for her anxiety and depression- referral placed today If this persists may need to examine daily IBS-D medications  She is amenable to conservative management first and will follow up          Other   GAD (generalized anxiety disorder)   Relevant Orders   Ambulatory referral  to Psychology   MDD (major depressive disorder), recurrent episode, moderate ([place])   Relevant Orders   Ambulatory referral to Psychology     No follow-ups on file.   I, Jailee Jaquez E Shyana Kulakowski, PA-C, have reviewed all documentation for this visit. The documentation on 06/27/22 for the  exam, diagnosis, procedures, and orders are all accurate and complete.   Talitha Givens, MHS, PA-C [facility]

## 2022-06-26 NOTE — Assessment & Plan Note (Addendum)
Acute on chronic, ongoing  Patient reports she has a hx dx of IBS and thinks this is related to stress and IBS-D Reviewed conservative management today- avoiding trigger foods and drinks, reducing stress, increasing fiber and using OTC anti-diarrheals PRN, symptom logs for correlation She states she has been under significant stress at work and diarrhea has compounded this She is amenable to therapy for her anxiety and depression- referral placed today If this persists may need to examine daily IBS-D medications  She is amenable to conservative management first and will follow up

## 2022-07-14 ENCOUNTER — Other Ambulatory Visit: Payer: Self-pay

## 2022-07-14 ENCOUNTER — Encounter: Payer: Self-pay | Admitting: Family Medicine

## 2022-07-14 ENCOUNTER — Telehealth (INDEPENDENT_AMBULATORY_CARE_PROVIDER_SITE_OTHER): Payer: No Typology Code available for payment source | Admitting: Family Medicine

## 2022-07-14 VITALS — Wt 196.0 lb

## 2022-07-14 DIAGNOSIS — F411 Generalized anxiety disorder: Secondary | ICD-10-CM | POA: Diagnosis not present

## 2022-07-14 DIAGNOSIS — F458 Other somatoform disorders: Secondary | ICD-10-CM

## 2022-07-14 DIAGNOSIS — F331 Major depressive disorder, recurrent, moderate: Secondary | ICD-10-CM | POA: Diagnosis not present

## 2022-07-14 MED ORDER — BUPROPION HCL ER (XL) 300 MG PO TB24
300.0000 mg | ORAL_TABLET | Freq: Every day | ORAL | 1 refills | Status: DC
  Filled 2022-07-14 – 2022-08-29 (×2): qty 90, 90d supply, fill #0
  Filled 2022-12-25: qty 90, 90d supply, fill #1

## 2022-07-14 MED ORDER — SAXENDA 18 MG/3ML ~~LOC~~ SOPN
PEN_INJECTOR | SUBCUTANEOUS | 1 refills | Status: AC
  Filled 2022-07-14 (×2): qty 15, 30d supply, fill #0
  Filled 2022-07-18: qty 15, 44d supply, fill #0
  Filled 2022-07-30: qty 15, 30d supply, fill #0
  Filled 2022-07-31: qty 52, 118d supply, fill #0
  Filled 2022-08-04 – 2022-08-13 (×2): qty 15, 30d supply, fill #0

## 2022-07-14 MED ORDER — ATORVASTATIN CALCIUM 40 MG PO TABS
40.0000 mg | ORAL_TABLET | Freq: Every day | ORAL | 0 refills | Status: DC
Start: 2022-07-14 — End: 2022-10-16
  Filled 2022-07-14: qty 90, 90d supply, fill #0

## 2022-07-14 MED ORDER — SERTRALINE HCL 25 MG PO TABS
ORAL_TABLET | ORAL | 3 refills | Status: DC
  Filled 2022-07-14: qty 60, 30d supply, fill #0

## 2022-07-14 MED ORDER — BUPROPION HCL ER (XL) 150 MG PO TB24
150.0000 mg | ORAL_TABLET | Freq: Every day | ORAL | 1 refills | Status: DC
Start: 2022-07-14 — End: 2023-05-10
  Filled 2022-07-14 – 2022-08-29 (×2): qty 90, 90d supply, fill #0
  Filled 2022-12-25: qty 90, 90d supply, fill #1

## 2022-07-14 MED ORDER — FUROSEMIDE 20 MG PO TABS
20.0000 mg | ORAL_TABLET | Freq: Every day | ORAL | 0 refills | Status: DC
Start: 2022-07-14 — End: 2022-10-16
  Filled 2022-07-14: qty 90, 90d supply, fill #0

## 2022-07-14 NOTE — Assessment & Plan Note (Signed)
Offered stool studies- will try to get mood under better control. Recheck 1 month.

## 2022-07-14 NOTE — Assessment & Plan Note (Signed)
Not doing well. Will restart her sertraline and recheck 1 month. Call with any concerns.

## 2022-07-14 NOTE — Assessment & Plan Note (Signed)
Not doing well. Will restart her sertraline and recheck 1 month. Call with any concerns.  

## 2022-07-14 NOTE — Patient Instructions (Signed)
[email or web address]

## 2022-07-14 NOTE — Progress Notes (Signed)
Wt 196 lb (88.9 kg)   BMI 32.62 kg/m    Subjective:    Patient ID: Emma Rogers, female    DOB: [birth year], 56 y.o.   MRN: [id number]  HPI: Emma Rogers is a 55 y.o. female  Chief Complaint  Patient presents with   Depression    Patient doing 4 week follow up for increase in trazadone, states it is working well for her.    Diarrhea    Patient states she is still having diarrhea, states she went on a cruise and the stomach issues had stopped and when she returned they started again. Patient thinks it is stress causing her to feel like that.    DEPRESSION Mood status: uncontrolled Satisfied with current treatment?: no Symptom severity: moderate  Duration of current treatment : chronic Side effects: no Medication compliance: good compliance Psychotherapy/counseling: yes in the past Previous psychiatric medications: wellbutrin, sertraline, cymbalta, lexapro Depressed mood: yes Anxious mood: yes Anhedonia: no Significant weight loss or gain: no Insomnia: yes  Fatigue: yes Feelings of worthlessness or guilt: yes Impaired concentration/indecisiveness: yes Suicidal ideations: no Hopelessness: no Crying spells: no    07/14/2022   10:05 AM 06/26/2022   10:31 AM 06/13/2022    3:46 PM 06/04/2022    3:06 PM 03/13/2022    3:39 PM  Depression screen PHQ 2/9  Decreased Interest 0 0 1 0 0  Down, Depressed, Hopeless 0 1 0 2 0  PHQ - 2 Score 0 1 1 2  0  Altered sleeping 1 1 2 3 3   Tired, decreased energy 1 1 3 3 3   Change in appetite 0 0 0 1 0  Feeling bad or failure about yourself  0 0 0 1 0  Trouble concentrating 3 1 1 3 1   Moving slowly or fidgety/restless 0 0 0 0 0  Suicidal thoughts 0 0 0 0 0  PHQ-9 Score 5 4 7 13 7   Difficult doing work/chores  Not difficult at all Somewhat difficult Somewhat difficult Not difficult at all      07/14/2022   10:06 AM 06/26/2022   10:32 AM 06/13/2022    3:46 PM 06/04/2022    3:06 PM  GAD 7 : Generalized Anxiety Score  Nervous, Anxious,  on Edge 3 1 1 3   Control/stop worrying 1 0 1 2  Worry too much - different things 1 2 1 3   Trouble relaxing 3 0 0 3  Restless 0 0 0 0  Easily annoyed or irritable 1 1 1 3   Afraid - awful might happen 1 0 0 2  Total GAD 7 Score 10 4 4 16   Anxiety Difficulty Not difficult at all Not difficult at all Somewhat difficult Very difficult      Relevant past medical, surgical, family and social history reviewed and updated as indicated. Interim medical history since our last visit reviewed. Allergies and medications reviewed and updated.  Review of Systems  Constitutional: Negative.   Respiratory: Negative.    Cardiovascular: Negative.   Gastrointestinal:  Positive for abdominal pain and diarrhea. Negative for abdominal distention, anal bleeding, blood in stool, constipation, nausea, rectal pain and vomiting.  Psychiatric/Behavioral: Negative.      Per HPI unless specifically indicated above     Objective:    Wt 196 lb (88.9 kg)   BMI 32.62 kg/m   Wt Readings from Last 3 Encounters:  07/14/22 196 lb (88.9 kg)  06/26/22 200 lb 4.8 oz (90.9 kg)  06/13/22 193 lb (87.5 kg)  Physical Exam Vitals and nursing note reviewed.  Constitutional:      General: She is not in acute distress.    Appearance: Normal appearance. She is not ill-appearing, toxic-appearing or diaphoretic.  HENT:     Head: Normocephalic and atraumatic.     Right Ear: External ear normal.     Left Ear: External ear normal.     Nose: Nose normal.     Mouth/Throat:     Mouth: Mucous membranes are moist.     Pharynx: Oropharynx is clear.  Eyes:     General: No scleral icterus.       Right eye: No discharge.        Left eye: No discharge.     Extraocular Movements: Extraocular movements intact.     Conjunctiva/sclera: Conjunctivae normal.     Pupils: Pupils are equal, round, and reactive to light.  Cardiovascular:     Rate and Rhythm: Normal rate and regular rhythm.     Pulses: Normal pulses.     Heart  sounds: Normal heart sounds. No murmur heard.    No friction rub. No gallop.  Pulmonary:     Effort: Pulmonary effort is normal. No respiratory distress.     Breath sounds: Normal breath sounds. No stridor. No wheezing, rhonchi or rales.  Chest:     Chest wall: No tenderness.  Musculoskeletal:        General: Normal range of motion.     Cervical back: Normal range of motion and neck supple.  Skin:    General: Skin is warm and dry.     Capillary Refill: Capillary refill takes less than 2 seconds.     Coloration: Skin is not jaundiced or pale.     Findings: No bruising, erythema, lesion or rash.  Neurological:     General: No focal deficit present.     Mental Status: She is alert and oriented to person, place, and time. Mental status is at baseline.  Psychiatric:        Mood and Affect: Mood normal.        Behavior: Behavior normal.        Thought Content: Thought content normal.        Judgment: Judgment normal.     Results for orders placed or performed in visit on 06/13/22  Urinalysis, Routine w reflex microscopic  Result Value Ref Range   Specific Gravity, UA 1.020 1.005 - 1.030   pH, UA 5.5 5.0 - 7.5   Color, UA Yellow Yellow   Appearance Ur Clear Clear   Leukocytes,UA Negative Negative   Protein,UA Negative Negative/Trace   Glucose, UA Negative Negative   Ketones, UA Negative Negative   RBC, UA Negative Negative   Bilirubin, UA Negative Negative   Urobilinogen, Ur 0.2 0.2 - 1.0 mg/dL   Nitrite, UA Negative Negative      Assessment & Plan:   Problem List Items Addressed This Visit       Digestive   Diarrhea, psychogenic    Offered stool studies- will try to get mood under better control. Recheck 1 month.        Other   GAD (generalized anxiety disorder) - Primary    Not doing well. Will restart her sertraline and recheck 1 month. Call with any concerns.       Relevant Medications   buPROPion (WELLBUTRIN XL) 300 MG 24 hr tablet   buPROPion (WELLBUTRIN  XL) 150 MG 24 hr tablet   sertraline (ZOLOFT) 25 MG  tablet   MDD (major depressive disorder), recurrent episode, moderate ([facility])    Not doing well. Will restart her sertraline and recheck 1 month. Call with any concerns.       Relevant Medications   buPROPion (WELLBUTRIN XL) 300 MG 24 hr tablet   buPROPion (WELLBUTRIN XL) 150 MG 24 hr tablet   sertraline (ZOLOFT) 25 MG tablet     Follow up plan: Return in about 4 weeks (around 08/11/2022) for in person 6 months follow up.    This visit was completed via video visit through MyChart due to the restrictions of the COVID-19 pandemic. All issues as above were discussed and addressed. Physical exam was done as above through visual confirmation on video through MyChart. If it was felt that the patient should be evaluated in the office, they were directed there. The patient verbally consented to this visit. Location of the patient: home Location of the provider: work Those involved with this call:  Provider: Olevia Perches, DO CMA: Rolley Sims, CMA Front Desk/Registration: [employer]  Time spent on call:  25 minutes with patient face to face via video conference. More than 50% of this time was spent in counseling and coordination of care. 40 minutes total spent in review of patient's record and preparation of their chart.

## 2022-07-15 ENCOUNTER — Other Ambulatory Visit: Payer: Self-pay

## 2022-07-17 ENCOUNTER — Other Ambulatory Visit (HOSPITAL_COMMUNITY): Payer: Self-pay

## 2022-07-18 ENCOUNTER — Other Ambulatory Visit: Payer: Self-pay

## 2022-07-18 ENCOUNTER — Encounter: Payer: Self-pay | Admitting: Physician Assistant

## 2022-07-18 ENCOUNTER — Ambulatory Visit (INDEPENDENT_AMBULATORY_CARE_PROVIDER_SITE_OTHER): Payer: No Typology Code available for payment source | Admitting: Physician Assistant

## 2022-07-18 VITALS — BP 108/66 | HR 73 | Temp 99.0°F | Wt 201.5 lb

## 2022-07-18 DIAGNOSIS — J4521 Mild intermittent asthma with (acute) exacerbation: Secondary | ICD-10-CM | POA: Insufficient documentation

## 2022-07-18 DIAGNOSIS — J309 Allergic rhinitis, unspecified: Secondary | ICD-10-CM

## 2022-07-18 MED ORDER — BUDESONIDE-FORMOTEROL FUMARATE 80-4.5 MCG/ACT IN AERO
2.0000 | INHALATION_SPRAY | Freq: Two times a day (BID) | RESPIRATORY_TRACT | 2 refills | Status: DC
  Filled 2022-07-18: qty 10.2, 30d supply, fill #0

## 2022-07-18 NOTE — Assessment & Plan Note (Addendum)
Acute exacerbation likely secondary to allergies  Recommend she restart her Symbicort daily and start an antihistamine to help with symptoms She can use Albuterol inhaler as needed for acute SOB or coughing fits Recommend continuing with daily controller regimen during allergy seasons then she can reduce therapy to Albuterol as needed as she does not like to take inhalers every day Discussed asthma therapy guidelines for mild, moderate, severe symptom burden and that this can change for a patient depending on triggers and illness- she voiced understanding Follow up as needed for persistent or progressing symptoms

## 2022-07-18 NOTE — Patient Instructions (Addendum)
  Please restart your Symbicort daily   Use your Albuterol rescue inhaler as needed - especially during a coughing fit- you can use it as needed every 6 hours   Try adding an antihistamine such as Zyrtec or Allegra daily - this time of the year is especially bad for allergies and those with asthma

## 2022-07-18 NOTE — Progress Notes (Unsigned)
Established Patient Office Visit  Name: Emma Rogers   MRN: [id number]    DOB: May 11, 1966   Date:07/21/2022  Today's Provider: Jacquelin Hawking, MHS, PA-C Introduced myself to the patient as a PA-C and provided education on APPs in clinical practice.         Subjective  Chief Complaint  Chief Complaint  Patient presents with   URI    Pt states she has been having a dry cough and sinus pressure for the past 5 days.     URI  Associated symptoms include coughing, ear pain, headaches and a sore throat. Pertinent negatives include no congestion or wheezing.      URI -type symptoms  Sinus pressure, and dry cough  Reports some mild sinus pressure and mild ear pain  Onset: gradual Duration: 5 days started as scratchy throat  Fever: no Sore throat: yes  Nausea: no  Vomiting: no Diarrhea: No Chest pain: No SOB: No Productive cough: no  Headaches: yes  Dizziness: No Ear pain: yes   Alleviating: Nyquil helps a little bit  Aggravating: Intervention:Nyquiil, Delsym, Ibuprofen  COVID testing at home: Has not tested for COVID at home- had it the last week in Aug   Result: NA  Albuterol inhaler use: has not used while sick    Patient Active Problem List   Diagnosis Date Noted   Mild intermittent asthma with exacerbation 07/18/2022   Diarrhea, psychogenic 06/26/2022   Nephrolithiasis 02/20/2022   Class 1 obesity due to excess calories with serious comorbidity and body mass index (BMI) of 32.0 to 32.9 in adult 07/15/2021   Psoriatic arthritis (HCC) 11/27/2020   Seronegative spondyloarthropathy 11/27/2020   Vertigo 06/16/2019   MDD (major depressive disorder), recurrent episode, moderate (HCC) 06/07/2019   GAD (generalized anxiety disorder) 05/26/2019   Memory deficit 04/26/2019   Chronic low back pain without sciatica 03/08/2019   Seizure-like activity (HCC) 03/08/2019   Moderate persistent asthma 01/27/2019   Cervical spinal stenosis 12/30/2018   Benign  essential tremor 12/30/2018   Chronic venous insufficiency 03/10/2018   Lymphedema 03/10/2018   Familial hyperlipidemia 10/10/2017   Chronic kidney disease, stage III (moderate) (HCC) 09/03/2017   Vitamin D deficiency 09/03/2017   Vitamin B12 deficiency 09/03/2017   Insomnia 08/11/2017   Menopausal symptoms 08/11/2017   Migraine without aura and without status migrainosus, not intractable 02/21/2017   Prediabetes 02/20/2017   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 [name]   Lobe hemithroidectomy     Right   TOTAL ABDOMINAL HYSTERECTOMY  2009   still has 1 ovary    Family History  Problem Relation Age of Onset   Rheum arthritis Mother    Asthma Mother    Heart disease Father        pacemaker,arrythmias   Lung disease Father    COPD Maternal Grandmother    Breast cancer Maternal Grandmother    Heart disease Maternal Grandfather    Liver disease Paternal Grandmother    Heart disease Paternal Grandfather    Asthma Other    Coronary artery disease Other        Female 1st degree relative <50   Lung cancer Other    Prostate cancer Other  1st degree relative <50    Social History   Tobacco Use   Smoking status: Never   Smokeless tobacco: Never  Substance Use Topics   Alcohol use: No    Alcohol/week: 0.0 standard drinks of alcohol     Current Outpatient Medications:    albuterol (VENTOLIN HFA) 108 (90 Base) MCG/ACT inhaler, Inhale 2 puffs into the lungs every 6 (six) hours as needed for wheezing or shortness of breath., Disp: 18 g, Rfl: 2   atorvastatin (LIPITOR) 40 MG tablet, Take 1 tablet (40 mg total) by mouth at bedtime., Disp: 90 tablet, Rfl: 0   buPROPion (WELLBUTRIN XL) 150 MG 24 hr tablet, Take 1  tablet (150 mg total) by mouth daily., Disp: 90 tablet, Rfl: 1   buPROPion (WELLBUTRIN XL) 300 MG 24 hr tablet, Take 1 tablet (300 mg total) by mouth daily., Disp: 90 tablet, Rfl: 1   cyclobenzaprine (FLEXERIL) 5 MG tablet, Take 1 tablet (5 mg total) by mouth at bedtime as needed (Back pain or muscle spasm)., Disp: 30 tablet, Rfl: 1   furosemide (LASIX) 20 MG tablet, Take 1 tablet (20 mg total) by mouth daily., Disp: 90 tablet, Rfl: 0   Guselkumab (TREMFYA) 100 MG/ML SOPN, Inject 100 mg into the skin at week 4 and then every 8 weeks thereafter, Disp: 1 mL, Rfl: 1   Guselkumab (TREMFYA) 100 MG/ML SOPN, Inject 100 mg into the skin at week 0, Disp: 1 mL, Rfl: 0   Insulin Pen Needle 31G X 5 MM MISC, Use as directed., Disp: 100 each, Rfl: 12   Liraglutide -Weight Management (SAXENDA) 18 MG/3ML SOPN, Inject 0.6 mg into the skin daily for 7 days, THEN 1.2 mg daily for 7 days, THEN 1.8 mg daily for 7 days, THEN 2.4 mg daily for 7 days, THEN 3 mg daily., Disp: 52 mL, Rfl: 1   omeprazole (PRILOSEC) 20 MG capsule, Take 1 capsule (20 mg total) by mouth daily., Disp: 30 capsule, Rfl: 3   rizatriptan (MAXALT) 10 MG tablet, Take 1 tablet (10 mg total) by mouth as needed for migraine. May repeat in 2 hours if needed, Disp: 10 tablet, Rfl: 12   sertraline (ZOLOFT) 25 MG tablet, Take 1 pill daily for 1-2 weeks, then increase to 2 pills daily, Disp: 60 tablet, Rfl: 3   traZODone (DESYREL) 100 MG tablet, Take 2 tablets (200 mg total) by mouth at bedtime., Disp: 180 tablet, Rfl: 1   VITAMIN D PO, Take 1 tablet by mouth daily., Disp: , Rfl:    budesonide-formoterol (SYMBICORT) 80-4.5 MCG/ACT inhaler, Inhale 2 puffs into the lungs 2 (two) times daily., Disp: 10.2 g, Rfl: 2  Current Facility-Administered Medications:    cyanocobalamin ((VITAMIN B-12)) injection 1,000 mcg, 1,000 mcg, Intramuscular, Q30 days, Johnson, Megan P, DO  Allergies  Allergen Reactions   Amoxicillin Other (See Comments)   Clavulanic Acid Other  (See Comments)   Morphine And Related     vomiting    I personally reviewed active problem list, medication list, allergies, lab results with the patient/caregiver today.   Review of Systems  Constitutional:  Positive for malaise/fatigue. Negative for chills and fever.  HENT:  Positive for ear pain and sore throat. Negative for congestion and hearing loss.   Respiratory:  Positive for cough. Negative for sputum production, shortness of breath and wheezing.   Musculoskeletal:  Negative for myalgias.  Neurological:  Positive for headaches. Negative for dizziness.      Objective  Vitals:   07/18/22 1521  BP: 108/66  Pulse: 73  Temp: 99 F (37.2 C)  TempSrc: Oral  SpO2: 96%  Weight: 201 lb 8 oz (91.4 kg)    Body mass index is 33.53 kg/m.  Physical Exam Vitals reviewed.  Constitutional:      General: She is awake.     Appearance: Normal appearance. She is well-developed and well-groomed.  HENT:     Head: Normocephalic and atraumatic.     Right Ear: Ear canal and external ear normal. A middle ear effusion is present.     Left Ear: Ear canal and external ear normal. A middle ear effusion is present.     Mouth/Throat:     Lips: Pink.     Pharynx: Oropharynx is clear. Uvula midline. No oropharyngeal exudate or posterior oropharyngeal erythema.  Cardiovascular:     Rate and Rhythm: Normal rate and regular rhythm.     Heart sounds: Normal heart sounds. No murmur heard.    No friction rub. No gallop.  Pulmonary:     Effort: Pulmonary effort is normal.     Breath sounds: Normal breath sounds. No decreased air movement. No decreased breath sounds, wheezing, rhonchi or rales.  Musculoskeletal:     Cervical back: Normal range of motion and neck supple.  Lymphadenopathy:     Head:     Right side of head: No submental, submandibular or preauricular adenopathy.     Left side of head: No submental, submandibular or preauricular adenopathy.     Upper Body:     Right upper body:  No supraclavicular adenopathy.     Left upper body: No supraclavicular adenopathy.  Neurological:     Mental Status: She is alert.  Psychiatric:        Behavior: Behavior is cooperative.      Recent Results (from the past 2160 hour(s))  Urinalysis, Routine w reflex microscopic     Status: None   Collection Time: 06/13/22  3:49 PM  Result Value Ref Range   Specific Gravity, UA 1.020 1.005 - 1.030   pH, UA 5.5 5.0 - 7.5   Color, UA Yellow Yellow   Appearance Ur Clear Clear   Leukocytes,UA Negative Negative   Protein,UA Negative Negative/Trace   Glucose, UA Negative Negative   Ketones, UA Negative Negative   RBC, UA Negative Negative   Bilirubin, UA Negative Negative   Urobilinogen, Ur 0.2 0.2 - 1.0 mg/dL   Nitrite, UA Negative Negative     PHQ2/9:    07/14/2022   10:05 AM 06/26/2022   10:31 AM 06/13/2022    3:46 PM 06/04/2022    3:06 PM 03/13/2022    3:39 PM  Depression screen PHQ 2/9  Decreased Interest 0 0 1 0 0  Down, Depressed, Hopeless 0 1 0 2 0  PHQ - 2 Score 0 1 1 2  0  Altered sleeping 1 1 2 3 3   Tired, decreased energy 1 1 3 3 3   Change in appetite 0 0 0 1 0  Feeling bad or failure about yourself  0 0 0 1 0  Trouble concentrating 3 1 1 3 1   Moving slowly or fidgety/restless 0 0 0 0 0  Suicidal thoughts 0 0 0 0 0  PHQ-9 Score 5 4 7 13 7   Difficult doing work/chores  Not difficult at all Somewhat difficult Somewhat difficult Not difficult at all      Fall Risk:    06/26/2022   10:31 AM 06/04/2022    3:05 PM 02/20/2022  4:25 PM 12/24/2021    3:48 PM 08/26/2021    8:37 AM  Fall Risk   Falls in the past year? 1 1 1 1  0  Number falls in past yr: 1 0 0 0 0  Injury with Fall? 1 0 0 0 0  Risk for fall due to : No Fall Risks;History of fall(s) History of fall(s) History of fall(s) History of fall(s) No Fall Risks  Follow up Falls evaluation completed Falls evaluation completed Falls evaluation completed Falls evaluation completed Falls evaluation completed       Functional Status Survey:      Assessment & Plan  Problem List Items Addressed This Visit       Respiratory   Allergic rhinitis    Chronic, recurrent Suspect this is currently exacerbated at this time given symptoms and season of the year Recommend she start OTC antihistamine of choice and start inhalers for control of symptoms Follow up as needed for persistent or progressing symptoms      Mild intermittent asthma with exacerbation - Primary    Acute exacerbation likely secondary to allergies  Recommend she restart her Symbicort daily and start an antihistamine to help with symptoms She can use Albuterol inhaler as needed for acute SOB or coughing fits Recommend continuing with daily controller regimen during allergy seasons then she can reduce therapy to Albuterol as needed as she does not like to take inhalers every day Discussed asthma therapy guidelines for mild, moderate, severe symptom burden and that this can change for a patient depending on triggers and illness- she voiced understanding Follow up as needed for persistent or progressing symptoms        Relevant Medications   budesonide-formoterol (SYMBICORT) 80-4.5 MCG/ACT inhaler     No follow-ups on file.   I, Adylynn Hertenstein E Orlanda Lemmerman, PA-C, have reviewed all documentation for this visit. The documentation on 07/21/22 for the exam, diagnosis, procedures, and orders are all accurate and complete.   07/23/22, MHS, PA-C [facility]

## 2022-07-21 NOTE — Assessment & Plan Note (Signed)
Chronic, recurrent Suspect this is currently exacerbated at this time given symptoms and season of the year Recommend she start OTC antihistamine of choice and start inhalers for control of symptoms Follow up as needed for persistent or progressing symptoms

## 2022-07-22 ENCOUNTER — Ambulatory Visit (INDEPENDENT_AMBULATORY_CARE_PROVIDER_SITE_OTHER): Payer: No Typology Code available for payment source

## 2022-07-22 DIAGNOSIS — Z23 Encounter for immunization: Secondary | ICD-10-CM | POA: Diagnosis not present

## 2022-07-29 ENCOUNTER — Other Ambulatory Visit (HOSPITAL_COMMUNITY): Payer: Self-pay

## 2022-07-29 ENCOUNTER — Other Ambulatory Visit: Payer: Self-pay | Admitting: Internal Medicine

## 2022-07-29 DIAGNOSIS — Z79899 Other long term (current) drug therapy: Secondary | ICD-10-CM

## 2022-07-29 DIAGNOSIS — L4050 Arthropathic psoriasis, unspecified: Secondary | ICD-10-CM

## 2022-07-29 NOTE — Telephone Encounter (Signed)
Next Visit: Please advise  Last Visit: 03/04/2022  Last Fill: 03/12/2022  DX: Psoriatic arthritis   Current Dose per office note 03/04/2022: dosage not mentioned  Labs: 6/2/203 CBC WNL BMP Glucose 109  TB Gold: 02/07/2022 Negative   Left message informing patient that she is due to update labs and for follow-up appointment. Please advise when to schedule follow-up, thanks!  Okay to refill Tremfya?

## 2022-07-30 ENCOUNTER — Other Ambulatory Visit (HOSPITAL_COMMUNITY): Payer: Self-pay

## 2022-07-30 MED ORDER — TREMFYA 100 MG/ML ~~LOC~~ SOAJ
SUBCUTANEOUS | 1 refills | Status: DC
  Filled 2022-07-30: qty 1, 56d supply, fill #0
  Filled 2022-10-13: qty 1, 56d supply, fill #1

## 2022-07-31 ENCOUNTER — Other Ambulatory Visit: Payer: Self-pay

## 2022-07-31 ENCOUNTER — Other Ambulatory Visit (HOSPITAL_COMMUNITY): Payer: Self-pay

## 2022-07-31 NOTE — Telephone Encounter (Signed)
LMOM for patient to schedule follow up appointment. °

## 2022-08-04 ENCOUNTER — Other Ambulatory Visit: Payer: Self-pay

## 2022-08-04 ENCOUNTER — Other Ambulatory Visit (HOSPITAL_COMMUNITY): Payer: Self-pay

## 2022-08-05 ENCOUNTER — Ambulatory Visit (INDEPENDENT_AMBULATORY_CARE_PROVIDER_SITE_OTHER): Payer: No Typology Code available for payment source | Admitting: Physician Assistant

## 2022-08-05 ENCOUNTER — Encounter: Payer: Self-pay | Admitting: Physician Assistant

## 2022-08-05 ENCOUNTER — Ambulatory Visit
Admission: RE | Admit: 2022-08-05 | Discharge: 2022-08-05 | Disposition: A | Discharge status: Home / Self Care | Payer: No Typology Code available for payment source | Source: Ambulatory Visit | Attending: Physician Assistant | Admitting: Physician Assistant

## 2022-08-05 ENCOUNTER — Ambulatory Visit
Admission: RE | Admit: 2022-08-05 | Discharge: 2022-08-05 | Disposition: A | Discharge status: Home / Self Care | Payer: No Typology Code available for payment source | Attending: Physician Assistant | Admitting: Physician Assistant

## 2022-08-05 ENCOUNTER — Other Ambulatory Visit: Payer: Self-pay

## 2022-08-05 VITALS — BP 107/75 | HR 77 | Temp 98.6°F | Wt 199.0 lb

## 2022-08-05 DIAGNOSIS — M25552 Pain in left hip: Secondary | ICD-10-CM

## 2022-08-05 DIAGNOSIS — M5442 Lumbago with sciatica, left side: Secondary | ICD-10-CM

## 2022-08-05 MED ORDER — TRAMADOL HCL 50 MG PO TABS
50.0000 mg | ORAL_TABLET | Freq: Two times a day (BID) | ORAL | 0 refills | Status: DC | PRN
  Filled 2022-08-05: qty 15, 8d supply, fill #0

## 2022-08-05 NOTE — Progress Notes (Addendum)
Acute Office Visit   Patient: Emma Rogers   DOB: 1966-06-10   56 y.o. Female  MRN: [id number] Visit Date: 08/05/2022  Today's healthcare provider: Oswaldo Conroy Mitul Hallowell, PA-C  Introduced myself to the patient as a Secondary school teacher and provided education on APPs in clinical practice.    Chief Complaint  Patient presents with   Hip Pain    Patient states she has been having left hip pain since Saturday. Has noticed some popping noise in her left hip for about a month. Pain radiates from hip into pelvis.    Subjective    HPI HPI     Hip Pain    Additional comments: Patient states she has been having left hip pain since Saturday. Has noticed some popping noise in her left hip for about a month. Pain radiates from hip into pelvis.       Last edited by Rolley Sims, CMA on 08/05/2022  2:35 PM.       Left hip pain  Reports lower back pain/ hip pain on the left side and radiates around the left side and along LLQ/ anterior pelvis Reports it is sometimes running down her left leg  Onset: gradual Duration: since Sat- symptoms have seemed to get worse  For the last month she has felt a popping sensation while she walks   Location: Left lower back and left hip  Pain level and character: 8-9/10 sharp and stabbing  Radiation: to anterior left side, pelvis and abdomen  Interventions: She has taken Advil, a muscle relaxer, took a leftover Percocet from when she had a kidney stone but was still having pain   Alleviating: not much- Percocet dulled pain a little bit  Aggravating: Sitting and bending over   She denies recent falls, trauma to the area   She reports intermittent burning sensation in the lower back area but denies rashes or pain with palpation  She reports she has had similar pain in the past but this was when she had a kidney stone    Medications: Outpatient Medications Prior to Visit  Medication Sig   albuterol (VENTOLIN HFA) 108 (90 Base) MCG/ACT inhaler Inhale 2 puffs  into the lungs every 6 (six) hours as needed for wheezing or shortness of breath.   atorvastatin (LIPITOR) 40 MG tablet Take 1 tablet (40 mg total) by mouth at bedtime.   budesonide-formoterol (SYMBICORT) 80-4.5 MCG/ACT inhaler Inhale 2 puffs into the lungs 2 (two) times daily.   buPROPion (WELLBUTRIN XL) 150 MG 24 hr tablet Take 1 tablet (150 mg total) by mouth daily.   buPROPion (WELLBUTRIN XL) 300 MG 24 hr tablet Take 1 tablet (300 mg total) by mouth daily.   cyclobenzaprine (FLEXERIL) 5 MG tablet Take 1 tablet (5 mg total) by mouth at bedtime as needed (Back pain or muscle spasm).   furosemide (LASIX) 20 MG tablet Take 1 tablet (20 mg total) by mouth daily.   Guselkumab (TREMFYA) 100 MG/ML SOPN Inject 100 mg into the skin every 8 weeks   Insulin Pen Needle 31G X 5 MM MISC Use as directed.   Liraglutide -Weight Management (SAXENDA) 18 MG/3ML SOPN Inject 0.6 mg into the skin daily for 7 days, THEN 1.2 mg daily for 7 days, THEN 1.8 mg daily for 7 days, THEN 2.4 mg daily for 7 days, THEN 3 mg daily.   omeprazole (PRILOSEC) 20 MG capsule Take 1 capsule (20 mg total) by mouth daily.   rizatriptan (MAXALT) 10  MG tablet Take 1 tablet (10 mg total) by mouth as needed for migraine. May repeat in 2 hours if needed   sertraline (ZOLOFT) 25 MG tablet Take 1 pill daily for 1-2 weeks, then increase to 2 pills daily   traZODone (DESYREL) 100 MG tablet Take 2 tablets (200 mg total) by mouth at bedtime.   VITAMIN D PO Take 1 tablet by mouth daily.   Facility-Administered Medications Prior to Visit  Medication Dose Route Frequency Provider   cyanocobalamin ((VITAMIN B-12)) injection 1,000 mcg  1,000 mcg Intramuscular Q30 days Laural Benes, Megan P, DO    Review of Systems  Gastrointestinal:  Positive for nausea. Negative for blood in stool, constipation, diarrhea and vomiting.  Genitourinary:  Negative for difficulty urinating, dyspareunia, dysuria, enuresis, hematuria, vaginal bleeding, vaginal discharge and  vaginal pain.  Musculoskeletal:  Positive for arthralgias, back pain and gait problem.  Neurological:  Negative for tremors, weakness and numbness.       Objective    BP 107/75   Pulse 77   Temp 98.6 F (37 C)   Wt 199 lb (90.3 kg)   SpO2 96%   BMI 33.12 kg/m    Physical Exam Vitals reviewed.  Constitutional:      General: She is awake.     Appearance: Normal appearance. She is well-developed and well-groomed.  HENT:     Head: Normocephalic and atraumatic.  Pulmonary:     Effort: Pulmonary effort is normal.  Musculoskeletal:     Cervical back: Normal range of motion.     Thoracic back: Normal range of motion.     Lumbar back: Decreased range of motion. Positive left straight leg raise test.       Back:     Right hip: No deformity, tenderness or crepitus. Normal range of motion. Normal strength.     Left hip: Tenderness present. No deformity or crepitus. Decreased range of motion. Normal strength.       Legs:     Comments: Pain with lateral flexion to the left  Pain with bending forward   Neurological:     Mental Status: She is alert.  Psychiatric:        Attention and Perception: Attention and perception normal.        Mood and Affect: Mood and affect normal.        Speech: Speech normal.        Behavior: Behavior normal. Behavior is cooperative.       No results found for any visits on 08/05/22.  Assessment & Plan      No follow-ups on file.      Problem List Items Addressed This Visit   None Visit Diagnoses     Left hip pain    -  Primary Acute, new concern Reports left hip pain which seems to radiate to anterior hip and sometimes down her leg  Denies trauma or injury to the area recently Reports popping sensation in left hip Imaging did not find evidence of acute osseous injury or dislocation at this time, KUB negative for evidence of stones Cannot rule out bursitis, radiculopathy, muscle injury or strain at this time  Will provide Tramadol 50  mg PO BID PRN x 5 days as patient has been instructed to limit NSAID use and kidney function appears borderline Recommend rest and gentle stretches, massage and warm compresses If not improving in the next 1-2 weeks, may need referral to Orthopedics for further evaluation and management  Follow up as needed  for persistent or progressing symptoms     Relevant Medications   traMADol (ULTRAM) 50 MG tablet   Other Relevant Orders   DG Hip Unilat W OR W/O Pelvis Min 4 Views Left   Acute left-sided low back pain with left-sided sciatica     Acute, new concern Reports left sided hip and back pain with radiation to anterior pelvis and sciatic pain down left leg Review of imaging does not demonstrate osseous abnormalities or dislocation at this time  Will provide tramadol to assist with pain  If symptoms are not improving, will refer to orthopedics to assist with evaluation and management  Follow up as needed.    Relevant Medications   traMADol (ULTRAM) 50 MG tablet   Other Relevant Orders   DG Lumbar Spine Complete   DG Abd 1 View        No follow-ups on file.   I, Jedi Catalfamo E Dash Cardarelli, PA-C, have reviewed all documentation for this visit. The documentation on 08/06/22 for the exam, diagnosis, procedures, and orders are all accurate and complete.   Jacquelin Hawking, MHS, PA-C [facility]

## 2022-08-06 ENCOUNTER — Telehealth: Payer: Self-pay

## 2022-08-06 ENCOUNTER — Other Ambulatory Visit: Payer: Self-pay

## 2022-08-06 NOTE — Telephone Encounter (Signed)
PA initiated via CoverMyMeds for Saxenda 18MG /3ML  Key: Waiting on determination

## 2022-08-12 ENCOUNTER — Ambulatory Visit (INDEPENDENT_AMBULATORY_CARE_PROVIDER_SITE_OTHER): Payer: No Typology Code available for payment source | Admitting: Family Medicine

## 2022-08-12 ENCOUNTER — Encounter: Payer: Self-pay | Admitting: Family Medicine

## 2022-08-12 ENCOUNTER — Other Ambulatory Visit: Payer: Self-pay

## 2022-08-12 VITALS — BP 96/67 | HR 72 | Temp 98.5°F | Ht 66.0 in | Wt 201.7 lb

## 2022-08-12 DIAGNOSIS — F331 Major depressive disorder, recurrent, moderate: Secondary | ICD-10-CM

## 2022-08-12 DIAGNOSIS — F411 Generalized anxiety disorder: Secondary | ICD-10-CM

## 2022-08-12 DIAGNOSIS — M5442 Lumbago with sciatica, left side: Secondary | ICD-10-CM | POA: Diagnosis not present

## 2022-08-12 MED ORDER — CYCLOBENZAPRINE HCL 5 MG PO TABS
5.0000 mg | ORAL_TABLET | Freq: Every evening | ORAL | 1 refills | Status: DC | PRN
  Filled 2022-08-12: qty 30, 30d supply, fill #0

## 2022-08-12 MED ORDER — SERTRALINE HCL 50 MG PO TABS
50.0000 mg | ORAL_TABLET | Freq: Every day | ORAL | 1 refills | Status: DC
  Filled 2022-08-12: qty 90, 90d supply, fill #0
  Filled 2022-10-16: qty 90, 90d supply, fill #1

## 2022-08-12 MED ORDER — HYDROCODONE-ACETAMINOPHEN 10-325 MG PO TABS
1.0000 | ORAL_TABLET | Freq: Three times a day (TID) | ORAL | 0 refills | Status: AC | PRN
  Filled 2022-08-12: qty 15, 5d supply, fill #0

## 2022-08-12 MED ORDER — GABAPENTIN 100 MG PO CAPS
100.0000 mg | ORAL_CAPSULE | Freq: Every day | ORAL | 3 refills | Status: DC
  Filled 2022-08-12: qty 30, 30d supply, fill #0
  Filled 2022-09-03: qty 30, 30d supply, fill #1

## 2022-08-12 NOTE — Assessment & Plan Note (Signed)
Under good control on current regimen. Continue current regimen. Continue to monitor. Call with any concerns. Refills given. Labs drawn today.   

## 2022-08-12 NOTE — Progress Notes (Signed)
BP 96/67   Pulse 72   Temp 98.5 F (36.9 C) (Oral)   Ht 5\' 6"  (1.676 m)   Wt 201 lb 11.2 oz (91.5 kg)   SpO2 98%   BMI 32.56 kg/m    Subjective:    Patient ID: , female    DOB: [birth year], 56 y.o.   MRN: [id number]  HPI: Emma Rogers is a 56 y.o. female  Chief Complaint  Patient presents with   Depression   Back Pain    Pt states she has been having back pain for a week and a half. States she noticed some popping sensations in her back and has been hurting since. Had back x-ray recently.    Anxiety   ANXIETY/DEPRESSION Duration: chronic Status:better Anxious mood: no  Excessive worrying: no Irritability: no  Sweating: no Nausea: no Palpitations:no Hyperventilation: no Panic attacks: no Agoraphobia: no  Obscessions/compulsions: no Depressed mood: no    08/12/2022    3:04 PM 08/05/2022    2:37 PM 07/14/2022   10:05 AM 06/26/2022   10:31 AM 06/13/2022    3:46 PM  Depression screen PHQ 2/9  Decreased Interest 0 0 0 0 1  Down, Depressed, Hopeless 0 0 0 1 0  PHQ - 2 Score 0 0 0 1 1  Altered sleeping 2 2 1 1 2   Tired, decreased energy 2 2 1 1 3   Change in appetite 0 0 0 0 0  Feeling bad or failure about yourself  0 0 0 0 0  Trouble concentrating 1 1 3 1 1   Moving slowly or fidgety/restless 0 0 0 0 0  Suicidal thoughts 0 0 0 0 0  PHQ-9 Score 5 5 5 4 7   Difficult doing work/chores Not difficult at all Not difficult at all  Not difficult at all Somewhat difficult      08/12/2022    3:04 PM 08/05/2022    2:37 PM 07/14/2022   10:06 AM 06/26/2022   10:32 AM  GAD 7 : Generalized Anxiety Score  Nervous, Anxious, on Edge 1 1 3 1   Control/stop worrying 0 1 1 0  Worry too much - different things 0 1 1 2   Trouble relaxing 0 0 3 0  Restless 0 0 0 0  Easily annoyed or irritable 1 1 1 1   Afraid - awful might happen 0 1 1 0  Total GAD 7 Score 2 5 10 4   Anxiety Difficulty Not difficult at all Not difficult at all Not difficult at all Not difficult at all    Anhedonia: no Weight changes: no Insomnia: no   Hypersomnia: no Fatigue/loss of energy: no Feelings of worthlessness: no Feelings of guilt: no Impaired concentration/indecisiveness: no Suicidal ideations: no  Crying spells: no Recent Stressors/Life Changes: no   Relationship problems: no   Family stress: no     Financial stress: no    Job stress: no    Recent death/loss: no  BACK PAIN Duration: about a week and a half Mechanism of injury: unknown Location: Left and low back Onset: sudden Severity: moderate Quality: popping, weird feeling, shooting Frequency: intermittent, 7-8x a day Radiation: L leg Aggravating factors: sitting, changing position, standing for a long period of time Alleviating factors: nothing Status: stable Treatments attempted: flexeril, hydrocodone, tramadol  Relief with NSAIDs?: No NSAIDs Taken Nighttime pain:  yes Paresthesias / decreased sensation:  yes Bowel / bladder incontinence:  no Fevers:  no Dysuria / urinary frequency:  no  Relevant past medical, surgical, family  and social history reviewed and updated as indicated. Interim medical history since our last visit reviewed. Allergies and medications reviewed and updated.  Review of Systems  Constitutional: Negative.   Respiratory: Negative.    Cardiovascular: Negative.   Gastrointestinal: Negative.   Musculoskeletal:  Positive for back pain and myalgias. Negative for arthralgias, gait problem, joint swelling, neck pain and neck stiffness.  Neurological: Negative.   Psychiatric/Behavioral: Negative.      Per HPI unless specifically indicated above     Objective:    BP 96/67   Pulse 72   Temp 98.5 F (36.9 C) (Oral)   Ht 5\' 6"  (1.676 m)   Wt 201 lb 11.2 oz (91.5 kg)   SpO2 98%   BMI 32.56 kg/m   Wt Readings from Last 3 Encounters:  08/12/22 201 lb 11.2 oz (91.5 kg)  08/05/22 199 lb (90.3 kg)  07/18/22 201 lb 8 oz (91.4 kg)    Physical Exam Vitals and nursing note  reviewed.  Constitutional:      General: She is not in acute distress.    Appearance: Normal appearance. She is not ill-appearing, toxic-appearing or diaphoretic.  HENT:     Head: Normocephalic and atraumatic.     Right Ear: External ear normal.     Left Ear: External ear normal.     Nose: Nose normal.     Mouth/Throat:     Mouth: Mucous membranes are moist.     Pharynx: Oropharynx is clear.  Eyes:     General: No scleral icterus.       Right eye: No discharge.        Left eye: No discharge.     Extraocular Movements: Extraocular movements intact.     Conjunctiva/sclera: Conjunctivae normal.     Pupils: Pupils are equal, round, and reactive to light.  Cardiovascular:     Rate and Rhythm: Normal rate and regular rhythm.     Pulses: Normal pulses.     Heart sounds: Normal heart sounds. No murmur heard.    No friction rub. No gallop.  Pulmonary:     Effort: Pulmonary effort is normal. No respiratory distress.     Breath sounds: Normal breath sounds. No stridor. No wheezing, rhonchi or rales.  Chest:     Chest wall: No tenderness.  Musculoskeletal:        General: Normal range of motion.     Cervical back: Normal range of motion and neck supple.  Skin:    General: Skin is warm and dry.     Capillary Refill: Capillary refill takes less than 2 seconds.     Coloration: Skin is not jaundiced or pale.     Findings: No bruising, erythema, lesion or rash.  Neurological:     General: No focal deficit present.     Mental Status: She is alert and oriented to person, place, and time. Mental status is at baseline.  Psychiatric:        Mood and Affect: Mood normal.        Behavior: Behavior normal.        Thought Content: Thought content normal.        Judgment: Judgment normal.     Results for orders placed or performed in visit on 06/13/22  Urinalysis, Routine w reflex microscopic  Result Value Ref Range   Specific Gravity, UA 1.020 1.005 - 1.030   pH, UA 5.5 5.0 - 7.5   Color,  UA Yellow Yellow   Appearance Ur Clear  Clear   Leukocytes,UA Negative Negative   Protein,UA Negative Negative/Trace   Glucose, UA Negative Negative   Ketones, UA Negative Negative   RBC, UA Negative Negative   Bilirubin, UA Negative Negative   Urobilinogen, Ur 0.2 0.2 - 1.0 mg/dL   Nitrite, UA Negative Negative      Assessment & Plan:   Problem List Items Addressed This Visit       Other   GAD (generalized anxiety disorder)    Under good control on current regimen. Continue current regimen. Continue to monitor. Call with any concerns. Refills given. Labs drawn today.      Relevant Medications   sertraline (ZOLOFT) 50 MG tablet   MDD (major depressive disorder), recurrent episode, moderate (HCC)    Under good control on current regimen. Continue current regimen. Continue to monitor. Call with any concerns. Refills given. Labs drawn today.       Relevant Medications   sertraline (ZOLOFT) 50 MG tablet   Other Visit Diagnoses     Acute left-sided low back pain with left-sided sciatica    -  Primary   Will get her into PT and treat with hydrocodone, flexeril and gabapentin. Call with any concerns. Follow up 1 month.   Relevant Medications   sertraline (ZOLOFT) 50 MG tablet   HYDROcodone-acetaminophen (NORCO) 10-325 MG tablet   gabapentin (NEURONTIN) 100 MG capsule   cyclobenzaprine (FLEXERIL) 5 MG tablet   Other Relevant Orders   Ambulatory referral to Physical Therapy        Follow up plan: Return in about 4 weeks (around 09/09/2022).

## 2022-08-13 ENCOUNTER — Other Ambulatory Visit: Payer: Self-pay

## 2022-08-13 NOTE — Telephone Encounter (Signed)
PA denied via CoverMyMeds due to exceeding daily quantity limit. Per RX quantity limit of 3MG  daily will not be exceeded. Appeal done, per representative at [facility] appeal may take up to 15 days. Patient notified.

## 2022-08-18 ENCOUNTER — Other Ambulatory Visit: Payer: Self-pay

## 2022-08-18 NOTE — Telephone Encounter (Signed)
Appeal denied due to exceeding quantity limit. Allowed quantity for plan is per 30-day supply.

## 2022-08-27 ENCOUNTER — Encounter: Payer: Self-pay | Admitting: Family Medicine

## 2022-08-29 ENCOUNTER — Other Ambulatory Visit: Payer: Self-pay

## 2022-09-04 ENCOUNTER — Other Ambulatory Visit: Payer: Self-pay

## 2022-09-05 ENCOUNTER — Other Ambulatory Visit: Payer: Self-pay

## 2022-09-09 ENCOUNTER — Other Ambulatory Visit: Payer: Self-pay

## 2022-09-09 ENCOUNTER — Encounter: Payer: Self-pay | Admitting: Family Medicine

## 2022-09-09 ENCOUNTER — Ambulatory Visit (INDEPENDENT_AMBULATORY_CARE_PROVIDER_SITE_OTHER): Payer: No Typology Code available for payment source | Admitting: Family Medicine

## 2022-09-09 VITALS — BP 111/73 | HR 61 | Temp 98.4°F | Ht 66.0 in | Wt 199.8 lb

## 2022-09-09 DIAGNOSIS — M5442 Lumbago with sciatica, left side: Secondary | ICD-10-CM | POA: Diagnosis not present

## 2022-09-09 MED ORDER — TRIAMCINOLONE ACETONIDE 40 MG/ML IJ SUSP
40.0000 mg | Freq: Once | INTRAMUSCULAR | Status: AC
  Administered 2022-09-09: 40 mg via INTRAMUSCULAR

## 2022-09-09 MED ORDER — GABAPENTIN 300 MG PO CAPS
300.0000 mg | ORAL_CAPSULE | Freq: Every day | ORAL | 3 refills | Status: DC
  Filled 2022-09-09: qty 30, 30d supply, fill #0
  Filled 2022-10-16: qty 30, 30d supply, fill #1
  Filled 2022-11-23: qty 30, 30d supply, fill #2
  Filled 2022-12-25: qty 30, 30d supply, fill #3

## 2022-09-09 MED ORDER — PREDNISONE 10 MG PO TABS
ORAL_TABLET | ORAL | 0 refills | Status: DC
  Filled 2022-09-09: qty 21, 6d supply, fill #0

## 2022-09-09 NOTE — Progress Notes (Signed)
BP 111/73   Pulse 61   Temp 98.4 F (36.9 C) (Oral)   Ht 5\' 6"  (1.676 m)   Wt 199 lb 12.8 oz (90.6 kg)   SpO2 96%   BMI 32.25 kg/m    Subjective:    Patient ID: , female    DOB: [birth year], 56 y.o.   MRN: [id number]  HPI: Emma Rogers is a 56 y.o. female  Chief Complaint  Patient presents with   Medication Problem    Patient says she the Oxycodone and the muscle relaxer does not help, says it may take the edge off. Patient says when she takes the Gabapentin 200 mg it helps her more.    Back Pain    Patient says when she is walking she hears a "popping" in her lumbar area. Patient says she did have a fall on a cruise back in October and says her issues started shortly after that.    BACK PAIN- still not doing well. She does note that she fell down the stairs when she was on a cruise. She notes that she has been having more popping in her back and  Duration: 5.5 weeks Mechanism of injury: had a fall, but otherwise nothing Location: left low back Onset: sudden Severity: severe Quality: popping, weird feeling and shooting Frequency: intermittent, 7-8x a day Radiation: down L leg Aggravating factors: sitting, changing position, standing for a long period of time, walking with longer strides Alleviating factors:  200mg  gabapentin Status: worse Treatments attempted: flexeril, hydrocodone, tramadol, gabapentin  Relief with NSAIDs?: no Nighttime pain:  no Paresthesias / decreased sensation:  no Bowel / bladder incontinence:  no Fevers:  no Dysuria / urinary frequency:  no   Relevant past medical, surgical, family and social history reviewed and updated as indicated. Interim medical history since our last visit reviewed. Allergies and medications reviewed and updated.  Review of Systems  Constitutional: Negative.   Respiratory: Negative.    Cardiovascular: Negative.   Gastrointestinal: Negative.   Musculoskeletal:  Positive for back pain and myalgias.  Negative for arthralgias, gait problem, joint swelling, neck pain and neck stiffness.  Skin: Negative.   Psychiatric/Behavioral: Negative.      Per HPI unless specifically indicated above     Objective:    BP 111/73   Pulse 61   Temp 98.4 F (36.9 C) (Oral)   Ht 5\' 6"  (1.676 m)   Wt 199 lb 12.8 oz (90.6 kg)   SpO2 96%   BMI 32.25 kg/m   Wt Readings from Last 3 Encounters:  09/09/22 199 lb 12.8 oz (90.6 kg)  08/12/22 201 lb 11.2 oz (91.5 kg)  08/05/22 199 lb (90.3 kg)    Physical Exam Vitals and nursing note reviewed.  Constitutional:      General: She is not in acute distress.    Appearance: Normal appearance. She is not ill-appearing, toxic-appearing or diaphoretic.  HENT:     Head: Normocephalic and atraumatic.     Right Ear: External ear normal.     Left Ear: External ear normal.     Nose: Nose normal.     Mouth/Throat:     Mouth: Mucous membranes are moist.     Pharynx: Oropharynx is clear.  Eyes:     General: No scleral icterus.       Right eye: No discharge.        Left eye: No discharge.     Extraocular Movements: Extraocular movements intact.     Conjunctiva/sclera: Conjunctivae  normal.     Pupils: Pupils are equal, round, and reactive to light.  Cardiovascular:     Rate and Rhythm: Normal rate and regular rhythm.     Pulses: Normal pulses.     Heart sounds: Normal heart sounds. No murmur heard.    No friction rub. No gallop.  Pulmonary:     Effort: Pulmonary effort is normal. No respiratory distress.     Breath sounds: Normal breath sounds. No stridor. No wheezing, rhonchi or rales.  Chest:     Chest wall: No tenderness.  Musculoskeletal:        General: Normal range of motion.     Cervical back: Normal range of motion and neck supple.  Skin:    General: Skin is warm and dry.     Capillary Refill: Capillary refill takes less than 2 seconds.     Coloration: Skin is not jaundiced or pale.     Findings: No bruising, erythema, lesion or rash.   Neurological:     General: No focal deficit present.     Mental Status: She is alert and oriented to person, place, and time. Mental status is at baseline.  Psychiatric:        Mood and Affect: Mood normal.        Behavior: Behavior normal.        Thought Content: Thought content normal.        Judgment: Judgment normal.     Results for orders placed or performed in visit on 06/13/22  Urinalysis, Routine w reflex microscopic  Result Value Ref Range   Specific Gravity, UA 1.020 1.005 - 1.030   pH, UA 5.5 5.0 - 7.5   Color, UA Yellow Yellow   Appearance Ur Clear Clear   Leukocytes,UA Negative Negative   Protein,UA Negative Negative/Trace   Glucose, UA Negative Negative   Ketones, UA Negative Negative   RBC, UA Negative Negative   Bilirubin, UA Negative Negative   Urobilinogen, Ur 0.2 0.2 - 1.0 mg/dL   Nitrite, UA Negative Negative      Assessment & Plan:   Problem List Items Addressed This Visit   None Visit Diagnoses     Acute left-sided low back pain with left-sided sciatica    -  Primary   Will increase her gabapentin to 300mg  and will treat with prednisone. Referral to ortho made today. Will get her into PT.   Relevant Medications   predniSONE (DELTASONE) 10 MG tablet   gabapentin (NEURONTIN) 300 MG capsule   triamcinolone acetonide (KENALOG-40) injection 40 mg (Completed)   Other Relevant Orders   Ambulatory referral to Orthopedic Surgery        Follow up plan: Return in about 4 weeks (around 10/07/2022).

## 2022-09-09 NOTE — Patient Instructions (Signed)
[phone] [facility]

## 2022-09-10 ENCOUNTER — Other Ambulatory Visit: Payer: Self-pay

## 2022-09-10 DIAGNOSIS — N200 Calculus of kidney: Secondary | ICD-10-CM

## 2022-09-11 ENCOUNTER — Ambulatory Visit: Payer: No Typology Code available for payment source | Admitting: Urology

## 2022-09-16 ENCOUNTER — Encounter: Payer: Self-pay | Admitting: Urology

## 2022-09-17 ENCOUNTER — Ambulatory Visit: Payer: No Typology Code available for payment source | Admitting: Urology

## 2022-09-18 ENCOUNTER — Other Ambulatory Visit (HOSPITAL_COMMUNITY): Payer: Self-pay
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
## 2022-09-23 ENCOUNTER — Other Ambulatory Visit (HOSPITAL_COMMUNITY): Payer: Self-pay

## 2022-09-23 ENCOUNTER — Ambulatory Visit: Payer: Commercial Managed Care - PPO | Attending: Internal Medicine | Admitting: Internal Medicine

## 2022-09-23 ENCOUNTER — Encounter: Payer: Self-pay | Admitting: Internal Medicine

## 2022-09-23 VITALS — BP 119/83 | HR 75 | Resp 18 | Ht 66.0 in | Wt 198.0 lb

## 2022-09-23 DIAGNOSIS — G8929 Other chronic pain: Secondary | ICD-10-CM

## 2022-09-23 DIAGNOSIS — N183 Chronic kidney disease, stage 3 unspecified: Secondary | ICD-10-CM

## 2022-09-23 DIAGNOSIS — M545 Low back pain, unspecified: Secondary | ICD-10-CM

## 2022-09-23 DIAGNOSIS — L4050 Arthropathic psoriasis, unspecified: Secondary | ICD-10-CM

## 2022-09-23 DIAGNOSIS — M4802 Spinal stenosis, cervical region: Secondary | ICD-10-CM

## 2022-09-23 DIAGNOSIS — M47819 Spondylosis without myelopathy or radiculopathy, site unspecified: Secondary | ICD-10-CM | POA: Diagnosis not present

## 2022-09-24 LAB — CBC WITH DIFFERENTIAL/PLATELET
Absolute Monocytes: 559 cells/uL (ref 200–950)
Basophils Absolute: 59 cells/uL (ref 0–200)
Basophils Relative: 0.6 %
Eosinophils Absolute: 98 cells/uL (ref 15–500)
Eosinophils Relative: 1 %
HCT: 44.4 % (ref 35.0–45.0)
Hemoglobin: 15 g/dL (ref 11.7–15.5)
Lymphs Abs: 1931 cells/uL (ref 850–3900)
MCH: 31.9 pg (ref 27.0–33.0)
MCHC: 33.8 g/dL (ref 32.0–36.0)
MCV: 94.5 fL (ref 80.0–100.0)
MPV: 9.8 fL (ref 7.5–12.5)
Monocytes Relative: 5.7 %
Neutro Abs: 7154 cells/uL (ref 1500–7800)
Neutrophils Relative %: 73 %
Platelets: 305 10*3/uL (ref 140–400)
RBC: 4.7 10*6/uL (ref 3.80–5.10)
RDW: 13 % (ref 11.0–15.0)
Total Lymphocyte: 19.7 %
WBC: 9.8 10*3/uL (ref 3.8–10.8)

## 2022-09-24 LAB — C-REACTIVE PROTEIN: CRP: 0.4 mg/L (ref <8.0)

## 2022-09-24 LAB — SEDIMENTATION RATE: Sed Rate: 14 mm/h (ref 0–30)

## 2022-09-25 ENCOUNTER — Other Ambulatory Visit (HOSPITAL_COMMUNITY): Payer: Self-pay

## 2022-09-25 ENCOUNTER — Telehealth: Payer: Self-pay

## 2022-09-25 NOTE — Telephone Encounter (Signed)
Received notification from [facility] that pt's current PA has expired.  Initiated an an [place] request to [facility] for [facility] via CoverMyMeds. Cannot complete at this time due to the [place] note from 09/23/22 being incomplete and without any information pertaining to clinical effectiveness of Tremfya.  Key: Ernestina Penna

## 2022-09-30 NOTE — Telephone Encounter (Signed)
Signed OV note submitted for Tremfya PA renewal.  CMM Key: Michelle Piper, PharmD, MPH, BCPS, CPP Clinical Pharmacist (Rheumatology and Pulmonology)

## 2022-10-01 NOTE — Telephone Encounter (Signed)
Received notification from [facility] regarding a prior authorization for [facility]. Authorization has been APPROVED from 10/01/22 to 10/01/2023. Approval letter sent to scan center.  Patient must continue to fill through [place]: [phone]   Authorization # [id number]  Knox Saliva, PharmD, MPH, BCPS, CPP Clinical Pharmacist (Rheumatology and Pulmonology)

## 2022-10-03 ENCOUNTER — Other Ambulatory Visit (HOSPITAL_COMMUNITY): Payer: Self-pay

## 2022-10-08 DIAGNOSIS — M5459 Other low back pain: Secondary | ICD-10-CM | POA: Diagnosis not present

## 2022-10-08 DIAGNOSIS — M5416 Radiculopathy, lumbar region: Secondary | ICD-10-CM | POA: Diagnosis not present

## 2022-10-09 ENCOUNTER — Other Ambulatory Visit (HOSPITAL_COMMUNITY): Payer: Self-pay

## 2022-10-13 ENCOUNTER — Other Ambulatory Visit: Payer: Self-pay

## 2022-10-13 ENCOUNTER — Other Ambulatory Visit (HOSPITAL_COMMUNITY): Payer: Self-pay

## 2022-10-14 DIAGNOSIS — M5459 Other low back pain: Secondary | ICD-10-CM | POA: Diagnosis not present

## 2022-10-14 DIAGNOSIS — M5416 Radiculopathy, lumbar region: Secondary | ICD-10-CM | POA: Diagnosis not present

## 2022-10-16 ENCOUNTER — Other Ambulatory Visit: Payer: Self-pay | Admitting: Family Medicine

## 2022-10-17 ENCOUNTER — Other Ambulatory Visit: Payer: Self-pay

## 2022-10-17 MED ORDER — ATORVASTATIN CALCIUM 40 MG PO TABS
40.0000 mg | ORAL_TABLET | Freq: Every day | ORAL | 0 refills | Status: DC
  Filled 2022-10-17: qty 60, 60d supply, fill #0
  Filled 2022-10-17: qty 30, 30d supply, fill #0

## 2022-10-17 MED ORDER — FUROSEMIDE 20 MG PO TABS
20.0000 mg | ORAL_TABLET | Freq: Every day | ORAL | 0 refills | Status: DC
  Filled 2022-10-17: qty 90, 90d supply, fill #0

## 2022-10-17 NOTE — Telephone Encounter (Signed)
Requested Prescriptions  Pending Prescriptions Disp Refills   atorvastatin (LIPITOR) 40 MG tablet 90 tablet 0    Sig: Take 1 tablet (40 mg total) by mouth at bedtime. OFFICE VISIT NEEDED FOR ADDITIONAL REFILLS     Cardiovascular:  Antilipid - Statins Failed - 10/16/2022 11:48 PM      Failed - Lipid Panel in normal range within the last 12 months    Cholesterol, Total  Date Value Ref Range Status  12/24/2021 268 (H) 100 - 199 mg/dL Final   LDL Cholesterol (Calc)  Date Value Ref Range Status  11/01/2018 162 (H) mg/dL (calc) Final    Comment:    Reference range: <100 . Desirable range <100 mg/dL for primary prevention;   <70 mg/dL for patients with CHD or diabetic patients  with > or = 2 CHD risk factors. Marland Kitchen LDL-C is now calculated using the Martin-Hopkins  calculation, which is a validated novel method providing  better accuracy than the Friedewald equation in the  estimation of LDL-C.  Cresenciano Genre et al. Annamaria Helling. 4193;790(24): 2061-2068  (http://education.[employer].com/faq/FAQ164)    LDL Chol Calc (NIH)  Date Value Ref Range Status  12/24/2021 186 (H) 0 - 99 mg/dL Final   Direct LDL  Date Value Ref Range Status  02/13/2012 178.3 mg/dL Final    Comment:    Optimal:  <100 mg/dLNear or Above Optimal:  100-129 mg/dLBorderline High:  130-159 mg/dLHigh:  160-189 mg/dLVery High:  >190 mg/dL   HDL  Date Value Ref Range Status  12/24/2021 55 >39 mg/dL Final   Triglycerides  Date Value Ref Range Status  12/24/2021 148 0 - 149 mg/dL Final         Passed - Patient is not pregnant      Passed - Valid encounter within last 12 months    Recent Outpatient Visits           1 month ago Acute left-sided low back pain with left-sided sciatica   [place], Megan P, DO   2 months ago Acute left-sided low back pain with left-sided sciatica   [place] [facility] [place], Megan P, DO   2 months ago Left hip pain   [place]  [facility] Mecum, Erin E, PA-C   3 months ago Mild intermittent asthma with exacerbation   [place] [facility] Mecum, Dani Gobble, PA-C   3 months ago GAD (generalized anxiety disorder)   [place], [place], DO       Future Appointments             In 5 months Rice, Resa Miner, MD [facility]             furosemide (LASIX) 20 MG tablet 90 tablet 0    Sig: Take 1 tablet (20 mg total) by mouth daily. OFFICE VISIT NEEDED FOR ADDITIONAL REFILLS     Cardiovascular:  Diuretics - Loop Failed - 10/16/2022 11:48 PM      Failed - K in normal range and within 180 days    Potassium  Date Value Ref Range Status  02/21/2022 4.2 3.5 - 5.1 mmol/L Final  06/15/2014 3.3 (L) 3.5 - 5.1 mmol/L Final         Failed - Ca in normal range and within 180 days    Calcium  Date Value Ref Range Status  02/21/2022 9.4 8.9 - 10.3 mg/dL Final   Calcium, Total  Date Value Ref  Range Status  06/15/2014 9.2 8.5 - 10.1 mg/dL Final         Failed - Na in normal range and within 180 days    Sodium  Date Value Ref Range Status  02/21/2022 138 135 - 145 mmol/L Final  12/24/2021 138 134 - 144 mmol/L Final  06/15/2014 135 (L) 136 - 145 mmol/L Final         Failed - Cr in normal range and within 180 days    Creat  Date Value Ref Range Status  03/01/2019 1.14 (H) 0.50 - 1.05 mg/dL Final    Comment:    For patients >93 years of age, the reference limit for Creatinine is approximately 13% higher for people identified as African-American. .    Creatinine, Ser  Date Value Ref Range Status  02/21/2022 0.89 0.44 - 1.00 mg/dL Final         Failed - Cl in normal range and within 180 days    Chloride  Date Value Ref Range Status  02/21/2022 102 98 - 111 mmol/L Final  06/15/2014 98 98 - 107 mmol/L Final         Failed - Mg Level in normal range and within 180 days    Magnesium  Date Value Ref Range Status  03/01/2019 1.9  1.5 - 2.5 mg/dL Final         Passed - Last BP in normal range    BP Readings from Last 1 Encounters:  09/23/22 119/83         Passed - Valid encounter within last 6 months    Recent Outpatient Visits           1 month ago Acute left-sided low back pain with left-sided sciatica   [place], Megan P, DO   2 months ago Acute left-sided low back pain with left-sided sciatica   [place] [facility] [place], Megan P, DO   2 months ago Left hip pain   [place] [facility] Mecum, Erin E, PA-C   3 months ago Mild intermittent asthma with exacerbation   [place] [facility] Mecum, Dani Gobble, PA-C   3 months ago GAD (generalized anxiety disorder)   [place] Valerie Roys, DO       Future Appointments             In 5 months Rice, Resa Miner, MD [facility]

## 2022-10-20 ENCOUNTER — Other Ambulatory Visit: Payer: Self-pay

## 2022-10-21 ENCOUNTER — Other Ambulatory Visit: Payer: Self-pay

## 2022-10-24 ENCOUNTER — Other Ambulatory Visit: Payer: Self-pay

## 2022-10-24 DIAGNOSIS — M5416 Radiculopathy, lumbar region: Secondary | ICD-10-CM | POA: Diagnosis not present

## 2022-10-24 MED ORDER — TRAMADOL HCL 50 MG PO TABS
50.0000 mg | ORAL_TABLET | Freq: Four times a day (QID) | ORAL | 0 refills | Status: DC
  Filled 2022-10-24: qty 30, 8d supply, fill #0

## 2022-11-11 ENCOUNTER — Other Ambulatory Visit: Payer: Self-pay

## 2022-11-14 DIAGNOSIS — M5416 Radiculopathy, lumbar region: Secondary | ICD-10-CM | POA: Diagnosis not present

## 2022-11-21 ENCOUNTER — Telehealth: Payer: Self-pay | Admitting: Family Medicine

## 2022-11-21 DIAGNOSIS — M5416 Radiculopathy, lumbar region: Secondary | ICD-10-CM | POA: Diagnosis not present

## 2022-11-21 NOTE — Telephone Encounter (Signed)
Spoke with patient and made her aware of Dr.Johnson recommendations. Advised patient to give our office a call back if she has any questions or concerns. Patient verbalized understanding.

## 2022-11-21 NOTE — Telephone Encounter (Signed)
Disregards last message. Spoke with patient and she was informed by provider from [facility] Dr Kayleen Memos if it would be OK for patient to move forward with CT Myogram as the dye they use in the procedure can effect the kidneys and she is currently diagnosed with Stage 3 Chronic Kidney Disease. Patient is not yet scheduled at the moment, but are expected to receive a call to get scheduled for procedure. Please advise and patient says we can also reach out to her with a response via MyChart as well.

## 2022-11-21 NOTE — Telephone Encounter (Signed)
Last kidney function was normal but I would advise them to check with radiology as I do not know how much dye is used etc.

## 2022-11-21 NOTE — Telephone Encounter (Signed)
Copied from [place] [id number]. Topic: General - Inquiry >> Nov 21, 2022 12:05 PM Rosanne Ashing P wrote: Reason for CRM: pt called saying she is going to have a CT Myogram.  Dr. Kayleen Memos [facility] want to know if there is an issue with the dye.  CB#  [phone]

## 2022-11-25 ENCOUNTER — Other Ambulatory Visit: Payer: Self-pay | Admitting: Orthopedic Surgery

## 2022-11-25 DIAGNOSIS — M5416 Radiculopathy, lumbar region: Secondary | ICD-10-CM

## 2022-12-01 ENCOUNTER — Other Ambulatory Visit: Payer: Self-pay

## 2022-12-03 ENCOUNTER — Other Ambulatory Visit: Payer: Self-pay

## 2022-12-05 ENCOUNTER — Ambulatory Visit
Admission: RE | Admit: 2022-12-05 | Discharge: 2022-12-05 | Disposition: A | Discharge status: Home / Self Care | Payer: Commercial Managed Care - PPO | Source: Ambulatory Visit | Attending: Orthopedic Surgery | Admitting: Orthopedic Surgery

## 2022-12-05 ENCOUNTER — Other Ambulatory Visit: Payer: Self-pay

## 2022-12-05 DIAGNOSIS — M5416 Radiculopathy, lumbar region: Secondary | ICD-10-CM

## 2022-12-05 DIAGNOSIS — M48061 Spinal stenosis, lumbar region without neurogenic claudication: Secondary | ICD-10-CM | POA: Insufficient documentation

## 2022-12-05 DIAGNOSIS — M545 Low back pain, unspecified: Secondary | ICD-10-CM | POA: Diagnosis not present

## 2022-12-05 DIAGNOSIS — M4726 Other spondylosis with radiculopathy, lumbar region: Secondary | ICD-10-CM | POA: Diagnosis not present

## 2022-12-05 DIAGNOSIS — Z981 Arthrodesis status: Secondary | ICD-10-CM | POA: Insufficient documentation

## 2022-12-05 DIAGNOSIS — M4316 Spondylolisthesis, lumbar region: Secondary | ICD-10-CM | POA: Diagnosis not present

## 2022-12-05 MED ORDER — ACETAMINOPHEN 500 MG PO TABS: 1000.0000 mg | ORAL_TABLET | Freq: Once | ORAL | Status: DC

## 2022-12-05 MED ORDER — LIDOCAINE HCL (PF) 1 % IJ SOLN
10.0000 mL | Freq: Once | INTRAMUSCULAR | Status: AC
  Administered 2022-12-05: 10 mL

## 2022-12-05 MED ORDER — ACETAMINOPHEN 500 MG PO TABS
1000.0000 mg | ORAL_TABLET | Freq: Once | ORAL | Status: AC
  Administered 2022-12-05: 1000 mg via ORAL

## 2022-12-05 MED ORDER — ACETAMINOPHEN 500 MG PO TABS
ORAL_TABLET | ORAL | Status: AC
  Filled 2022-12-05: qty 2

## 2022-12-05 MED ORDER — IOHEXOL 300 MG/ML  SOLN
50.0000 mL | Freq: Once | INTRAMUSCULAR | Status: AC | PRN
  Administered 2022-12-05: 20 mL via INTRATHECAL

## 2022-12-05 NOTE — Procedures (Signed)
L4/L5 lumbar puncture for CT lumbar myelogram. Please see full dictation under the imaging tab in Epic.  Soyla Dryer, [place] [phone] 12/05/2022, 12:11 PM

## 2022-12-12 DIAGNOSIS — M5416 Radiculopathy, lumbar region: Secondary | ICD-10-CM | POA: Diagnosis not present

## 2022-12-17 ENCOUNTER — Other Ambulatory Visit (HOSPITAL_COMMUNITY): Payer: Self-pay

## 2022-12-18 DIAGNOSIS — M48062 Spinal stenosis, lumbar region with neurogenic claudication: Secondary | ICD-10-CM | POA: Diagnosis not present

## 2022-12-19 ENCOUNTER — Telehealth: Payer: Self-pay | Admitting: *Deleted

## 2022-12-19 ENCOUNTER — Other Ambulatory Visit: Payer: Self-pay | Admitting: Family Medicine

## 2022-12-19 ENCOUNTER — Other Ambulatory Visit: Payer: Self-pay

## 2022-12-19 DIAGNOSIS — G43009 Migraine without aura, not intractable, without status migrainosus: Secondary | ICD-10-CM

## 2022-12-19 MED FILL — Rizatriptan Benzoate Tab 10 MG (Base Equivalent): ORAL | 30 days supply | Qty: 10 | Fill #0 | Status: AC

## 2022-12-19 NOTE — Telephone Encounter (Signed)
   Pre-operative Risk Assessment    Patient Name: Emma Rogers  DOB: 03-30-1966 MRN: [id number]    PT LAST SEEN 2020 WITH DR. Curt Bears. PT IS GOING TO NEED A NEW PT APPT FOR PRE OP CLEARANCE. I WILL FORWARD TO CHART PREP AND TO [place], EP SCHEDULER TO REACH OUT TO THE PT WITH A NEW PT APPT.   Request for Surgical Clearance    Procedure:   L3-4 LUMBAR FUSION  Date of Surgery:  Clearance TBD                                 Surgeon:  DR. Kary Kos Surgeon's Group or Practice Name:  [place] Phone number:  [phone] Fax number:  [phone] ATTN: VANESSA EXT 244   Type of Clearance Requested:   - Medical ; NO MEDICATIONS LISTED AS NEEDING TO BE HELD   Type of Anesthesia:  General    Additional requests/questions:    Emma Rogers   12/19/2022, 9:53 AM

## 2022-12-19 NOTE — Telephone Encounter (Signed)
Requested Prescriptions  Pending Prescriptions Disp Refills   rizatriptan (MAXALT) 10 MG tablet 10 tablet 2    Sig: Take 1 tablet (10 mg total) by mouth as needed for migraine. May repeat in 2 hours if needed     Neurology:  Migraine Therapy - Triptan Passed - 12/19/2022 10:13 AM      Passed - Last BP in normal range    BP Readings from Last 1 Encounters:  12/05/22 127/82         Passed - Valid encounter within last 12 months    Recent Outpatient Visits           3 months ago Acute left-sided low back pain with left-sided sciatica   [place] [facility] [place], Megan P, DO   4 months ago Acute left-sided low back pain with left-sided sciatica   [place] [facility] [place], Megan P, DO   4 months ago Left hip pain   [place] [facility] Mecum, Erin E, PA-C   5 months ago Mild intermittent asthma with exacerbation   [place] [facility] Mecum, Dani Gobble, PA-C   5 months ago GAD (generalized anxiety disorder)   [place] Valerie Roys, DO       Future Appointments             In 3 months Rice, Resa Miner, MD [facility]

## 2022-12-23 ENCOUNTER — Telehealth: Payer: Self-pay | Admitting: *Deleted

## 2022-12-23 NOTE — Telephone Encounter (Signed)
Patient called, she will be having back surgery in a couple of weeks and would like to know if she should hold her Tremfya. Please advise.

## 2022-12-25 ENCOUNTER — Other Ambulatory Visit: Payer: Self-pay

## 2022-12-29 ENCOUNTER — Other Ambulatory Visit: Payer: Self-pay

## 2022-12-30 NOTE — Telephone Encounter (Signed)
I spoke with [name] her last dose of Tremfya she believes was late February and will be due coming up soon.  She has appointment with Dr. Elberta Fortis on the 11th for cardiac preoperative clearance.  Assuming no planned delays of greater than 2 months, I recommend holding off on the next Tremfya dose until after her surgery and can resume this 10 days later.

## 2023-01-01 ENCOUNTER — Ambulatory Visit: Payer: Commercial Managed Care - PPO | Attending: Cardiology | Admitting: Cardiology

## 2023-01-01 ENCOUNTER — Encounter: Payer: Self-pay | Admitting: Cardiology

## 2023-01-01 VITALS — BP 134/86 | HR 67 | Ht 65.0 in | Wt 210.0 lb

## 2023-01-01 DIAGNOSIS — Z01810 Encounter for preprocedural cardiovascular examination: Secondary | ICD-10-CM

## 2023-01-01 DIAGNOSIS — Z950 Presence of cardiac pacemaker: Secondary | ICD-10-CM

## 2023-01-01 NOTE — Patient Instructions (Signed)
Medication Instructions:  °Your physician recommends that you continue on your current medications as directed. Please refer to the Current Medication list given to you today. ° °*If you need a refill on your cardiac medications before your next appointment, please call your pharmacy* ° ° °Lab Work: °None ordered ° ° °Testing/Procedures: °None ordered ° ° °Follow-Up: °At [facility], you and your health needs are our priority.  As part of our continuing mission to provide you with exceptional heart care, we have created designated Provider Care Teams.  These Care Teams include your primary Cardiologist (physician) and Advanced Practice Providers (APPs -  Physician Assistants and Nurse Practitioners) who all work together to provide you with the care you need, when you need it. ° °Your next appointment:   °1 year(s) ° °The format for your next appointment:   °In Person ° °Provider:   °Will Camnitz, MD ° ° ° °Thank you for choosing [facility]!! ° ° °Nyzir Dubois, RN °[phone] ° ° ° °
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
## 2023-01-02 ENCOUNTER — Other Ambulatory Visit: Payer: Self-pay | Admitting: Neurosurgery

## 2023-01-06 DIAGNOSIS — M48062 Spinal stenosis, lumbar region with neurogenic claudication: Secondary | ICD-10-CM | POA: Diagnosis not present

## 2023-01-07 ENCOUNTER — Other Ambulatory Visit (HOSPITAL_COMMUNITY): Payer: Commercial Managed Care - PPO

## 2023-01-07 NOTE — Pre-Procedure Instructions (Signed)
Surgical Instructions    Your procedure is scheduled on Friday, April 19th.  Report to [facility] Main Entrance "A" at 08:20 A.M., then check in with the Admitting office.  Call this number if you have problems the morning of surgery:  [phone]  If you have any questions prior to your surgery date call [phone]: Open Monday-Friday 8am-4pm If you experience any cold or flu symptoms such as cough, fever, chills, shortness of breath, etc. between now and your scheduled surgery, please notify us at the above number.     Remember:  Do not eat or drink after midnight the night before your surgery     Take these medicines the morning of surgery with A SIP OF WATER  budesonide-formoterol (SYMBICORT)  buPROPion (WELLBUTRIN XL)  omeprazole (PRILOSEC)  sertraline (ZOLOFT)  traMADol (ULTRAM)    If needed: albuterol (VENTOLIN HFA)- bring inhaler with you on day of surgery rizatriptan (MAXALT)    As of today, STOP taking any Aspirin (unless otherwise instructed by your surgeon) Aleve, Naproxen, Ibuprofen, Motrin, Advil, Goody's, BC's, all herbal medications, fish oil, and all vitamins.                     Do NOT Smoke (Tobacco/Vaping) for 24 hours prior to your procedure.  If you use a CPAP at night, you may bring your mask/headgear for your overnight stay.   Contacts, glasses, piercing's, hearing aid's, dentures or partials may not be worn into surgery, please bring cases for these belongings.    For patients admitted to the hospital, discharge time will be determined by your treatment team.   Patients discharged the day of surgery will not be allowed to drive home, and someone needs to stay with them for 24 hours.  SURGICAL WAITING ROOM VISITATION Patients having surgery or a procedure may have no more than 2 support people in the waiting area - these visitors may rotate.   Children under the age of 19 must have an adult with them who is not the patient. If the patient needs  to stay at the hospital during part of their recovery, the visitor guidelines for inpatient rooms apply. Pre-op nurse will coordinate an appropriate time for 1 support person to accompany patient in pre-op.  This support person may not rotate.   Please refer to the [facility] website for the visitor guidelines for Inpatients (after your surgery is over and you are in a regular room).      Please read over the following fact sheets that you were given.    If you received a COVID test during your pre-op visit  it is requested that you wear a mask when out in public, stay away from anyone that may not be feeling well and notify your surgeon if you develop symptoms. If you have been in contact with anyone that has tested positive in the last 10 days please notify you surgeon.

## 2023-01-08 ENCOUNTER — Encounter (HOSPITAL_COMMUNITY)
Admission: RE | Admit: 2023-01-08 | Discharge: 2023-01-08 | Disposition: A | Discharge status: Home / Self Care | Payer: Commercial Managed Care - PPO | Source: Ambulatory Visit | Attending: Neurosurgery | Admitting: Neurosurgery

## 2023-01-08 ENCOUNTER — Other Ambulatory Visit: Payer: Self-pay

## 2023-01-08 ENCOUNTER — Encounter (HOSPITAL_COMMUNITY): Payer: Self-pay

## 2023-01-08 ENCOUNTER — Encounter: Payer: Self-pay | Admitting: Cardiology

## 2023-01-08 VITALS — BP 131/79 | HR 73 | Temp 98.5°F | Resp 20 | Ht 65.0 in | Wt 213.1 lb

## 2023-01-08 DIAGNOSIS — N183 Chronic kidney disease, stage 3 unspecified: Secondary | ICD-10-CM | POA: Diagnosis not present

## 2023-01-08 DIAGNOSIS — L4050 Arthropathic psoriasis, unspecified: Secondary | ICD-10-CM | POA: Diagnosis not present

## 2023-01-08 DIAGNOSIS — Z79620 Long term (current) use of immunosuppressive biologic: Secondary | ICD-10-CM | POA: Diagnosis not present

## 2023-01-08 DIAGNOSIS — J45909 Unspecified asthma, uncomplicated: Secondary | ICD-10-CM | POA: Insufficient documentation

## 2023-01-08 DIAGNOSIS — I493 Ventricular premature depolarization: Secondary | ICD-10-CM | POA: Insufficient documentation

## 2023-01-08 DIAGNOSIS — Z01812 Encounter for preprocedural laboratory examination: Secondary | ICD-10-CM | POA: Insufficient documentation

## 2023-01-08 DIAGNOSIS — Z01818 Encounter for other preprocedural examination: Secondary | ICD-10-CM

## 2023-01-08 DIAGNOSIS — Z950 Presence of cardiac pacemaker: Secondary | ICD-10-CM | POA: Diagnosis not present

## 2023-01-08 DIAGNOSIS — Z981 Arthrodesis status: Secondary | ICD-10-CM | POA: Diagnosis not present

## 2023-01-08 DIAGNOSIS — M5136 Other intervertebral disc degeneration, lumbar region: Secondary | ICD-10-CM | POA: Insufficient documentation

## 2023-01-08 DIAGNOSIS — M48061 Spinal stenosis, lumbar region without neurogenic claudication: Secondary | ICD-10-CM | POA: Insufficient documentation

## 2023-01-08 DIAGNOSIS — R55 Syncope and collapse: Secondary | ICD-10-CM

## 2023-01-08 HISTORY — DX: Other specified postprocedural states: Z98.890

## 2023-01-08 HISTORY — DX: Personal history of urinary calculi: Z87.442

## 2023-01-08 HISTORY — DX: Nausea with vomiting, unspecified: R11.2

## 2023-01-08 HISTORY — DX: Presence of cardiac pacemaker: Z95.0

## 2023-01-08 LAB — BASIC METABOLIC PANEL
Anion gap: 6 (ref 5–15)
BUN: 20 mg/dL (ref 6–20)
CO2: 27 mmol/L (ref 22–32)
Calcium: 9 mg/dL (ref 8.9–10.3)
Chloride: 103 mmol/L (ref 98–111)
Creatinine, Ser: 1.03 mg/dL — ABNORMAL HIGH (ref 0.44–1.00)
GFR, Estimated: >60 mL/min (ref >60)
Glucose, Bld: 120 mg/dL — ABNORMAL HIGH (ref 70–99)
Potassium: 3.9 mmol/L (ref 3.5–5.1)
Sodium: 136 mmol/L (ref 135–145)

## 2023-01-08 LAB — CBC
HCT: 40.8 % (ref 36.0–46.0)
Hemoglobin: 13.7 g/dL (ref 12.0–15.0)
MCH: 32 pg (ref 26.0–34.0)
MCHC: 33.6 g/dL (ref 30.0–36.0)
MCV: 95.3 fL (ref 80.0–100.0)
Platelets: 278 10*3/uL (ref 150–400)
RBC: 4.28 MIL/uL (ref 3.87–5.11)
RDW: 12.6 % (ref 11.5–15.5)
WBC: 8.1 10*3/uL (ref 4.0–10.5)
nRBC: 0 % (ref 0.0–0.2)

## 2023-01-08 LAB — SURGICAL PCR SCREEN
MRSA, PCR: NEGATIVE
Staphylococcus aureus: NEGATIVE

## 2023-01-08 LAB — TYPE AND SCREEN
ABO/RH(D): A POS
Antibody Screen: NEGATIVE

## 2023-01-08 NOTE — Progress Notes (Unsigned)
PERIOPERATIVE PRESCRIPTION FOR IMPLANTED CARDIAC DEVICE PROGRAMMING  Patient Information: Name:  Emma Rogers  DOB:  06-Sep-1966  MRN:  [id number]  {TIP - You do not have to delete this tip  -  Copy the info from the staff message sent by the PAT staff  then press F2 here and paste the information using CTL - V on the next line :[id number]}  Planned Procedure:  Posterior lumbar interbody fusion lumbar 3-lumbar 4- posterior lateral and interbody fusion  Surgeon:  Dr. Wynetta Emery  Date of Procedure:  01/09/23  Cautery will be used.  Position during surgery:  prone   Please send documentation back to:  [facility] (Fax # [phone])   Device Information:  Clinic EP Physician:  Loman Brooklyn, MD   Device Type:  Pacemaker Manufacturer and Phone #:  Medtronic: [id number] Pacemaker Dependent?:  Unknown Date of Last Device Check:  01/01/2023 Normal Device Function?:  Yes.    Electrophysiologist's Recommendations:  Have magnet available. Provide continuous ECG monitoring when magnet is used or reprogramming is to be performed.  Procedure will likely interfere with device function.  Device should be programmed:  Asynchronous pacing during procedure and returned to normal programming after procedure  Per Device Clinic Standing Orders, Lenor Coffin, RN  8:35 AM 01/08/2023

## 2023-01-08 NOTE — Progress Notes (Signed)
PCP - Dr. Olevia Perches Cardiologist - Dr. Loman Brooklyn  PPM/ICD - PPM Medtronic Device Orders - received, needs to be signed Rep Notified - Shari Prows with [employer] notified by phone and by e-mail  Chest x-ray - 01/08/21 EKG - 01/01/23 Stress Test - 6-7 years ago per pt ECHO - 01/11/18 Cardiac Cath - denies  Sleep Study - 04/20/19, OSA- CPAP - n/a  DM- denies  ASA/Blood Thinner Instructions: n/a   ERAS Protcol - no, NPO   COVID TEST- n/a   Anesthesia review: yes, cardiac hx, pacemaker  Patient denies shortness of breath, fever, cough and chest pain at PAT appointment   All instructions explained to the patient, with a verbal understanding of the material. Patient agrees to go over the instructions while at home for a better understanding. The opportunity to ask questions was provided.

## 2023-01-08 NOTE — Progress Notes (Signed)
Anesthesia Chart Review:   Case: [id number] Date/Time: 01/09/23 1007   Procedure: PLIF - L3-L4 - Posterior Lateral and Interbody fusion (Back)   Anesthesia type: General   Pre-op diagnosis: Stenosis   Location: [facility] ROOM 19 / [facility]   Surgeons: Donalee Citrin, MD       DISCUSSION: Patient is a 57 year old female scheduled for the above procedure.  History includes never smoker, post-operative N/V, neurocardiogenic syncope  (s/p explantation of old PPM and implantation of [employer]onic Adapta L ADDRL1 PPM 12/19/09), asthma, CKD (stage 3), right hemi-thyroidectomy, psoriatic arthritis, anxiety, spinal surgery (L4-5 revision laminectomy & TLIF 07/12/03  She had EP follow-up with Dr. Elberta Fortis on 01/01/23. He interrogated her device and wrote, "Has some noise on the atrial lead.  Device is otherwise functioning appropriately.  Will set her back up with remote monitoring for her pacemaker.  Otherwise, sensing, threshold, impedance within normal limits.  No changes." Estimated device longevity 3.5 years, 1.5-6 years.  In regards to surgery, Dr. Elberta Fortis wrote, "Preoperative evaluation: Has plans for L3/4 lumbar fusion.  She has no symptoms that suggest to cardiac issues.  No chest pain or shortness of breath.  Can work up to 4+ METS of activity.  She would be at low to intermediate risk for this intermediate risk procedure.  No further cardiac testing is necessary."  Rheumatologist Dr. Annamary Rummage recommended holding Tremfya until after surgery and can resume 10 days later. Last dose was around "late February" 2024.   Awaiting EP Perioperative Recommendations regarding PPM.  Anesthesia team to evaluate on the day of surgery.    VS: BP 131/79   Pulse 73   Temp 36.9 C (Oral)   Resp 20   Ht  (1.651 m)   Wt 96.7 kg   SpO2 98%   BMI 35.46 kg/m    PROVIDERS: Dorcas Carrow, DO is PCP  Loman Brooklyn, MD is EP cardiologist Sheliah Hatch, MD is rheumatologist   LABS: Labs reviewed: Acceptable for  surgery. (all labs ordered are listed, but only abnormal results are displayed)  Labs Reviewed  BASIC METABOLIC PANEL - Abnormal; Notable for the following components:      Result Value   Glucose, Bld 120 (*)    Creatinine, Ser 1.03 (*)    All other components within normal limits  SURGICAL PCR SCREEN  CBC  TYPE AND SCREEN    Sleep Study 04/20/19:  IMPRESSION: No evidence for obstructive sleep apnea.   IMAGES: CT L-spine 12/05/22: IMPRESSION: 1. Junctional level degenerative change with severe spinal canal narrowing at L3-L4 level secondary to a combination of a disc bulge and ligamentum flavum hypertrophy. There is also moderate to severe right sided neuroforaminal narrowing at this level. 2. Post surgical changes from L4-L5 spinal fusion and posterior decompression. There is solid osseous fusion at the surgical level. No evidence of spinal canal stenosis. Redundancy of the cauda equina nerve roots at this level secondary to high grade stenosis at L3-L4.    EKG: 01/01/23: NSR   CV: Long term monitor 08/08/19 -08/18/19: Max 162 bpm 12:06am, 11/26 Min 30 bpm 04:28am, 11/20 Avg 67 bpm Rare PACs and PVCs Predominant rhythm sinus rhythm Intermittent atrial and ventricular pacing Symptoms associated with sinus rhythm    Echo 01/11/18: Study Conclusions  - Left ventricle: The cavity size was normal. Systolic function was    normal. The estimated ejection fraction was in the range of 60%    to 65%. Wall motion was normal; there were no regional  wall    motion abnormalities. Left ventricular diastolic function    parameters were normal.  - Left atrium: The atrium was normal in size.  - Right ventricle: Pacer wire or catheter noted in right ventricle.    Systolic function was normal.  - Pulmonary arteries: Systolic pressure was within the normal    range.    Past Medical History:  Diagnosis Date   Abnormal MRI 04/03/2019   Allergic rhinitis    Anxiety    Asthma     Depression    Headache(784.0)    History of iritis 11/27/2020   History of kidney stones    PONV (postoperative nausea and vomiting)    Presence of permanent cardiac pacemaker    for neurocardiogenic sycnope; last implalnt 12/19/09 (Medtronic)   Psoriatic arthritis    Renal insufficiency    Stage 3 kidney disease.    Shingles     Past Surgical History:  Procedure Laterality Date   BREAST BIOPSY Left 09/30/2002   Ductogram-neg   INSERT / REPLACE / REMOVE PACEMAKER     Permanent pacemaker, Dr. [name]   Lobe hemithroidectomy     Right   LUMBAR FUSION  2004   w/ Dr. Wynetta Emery   LUMBAR LAMINECTOMY  1989   TOTAL ABDOMINAL HYSTERECTOMY  2009   still has 1 ovary    MEDICATIONS:  albuterol (VENTOLIN HFA) 108 (90 Base) MCG/ACT inhaler   atorvastatin (LIPITOR) 40 MG tablet   budesonide-formoterol (SYMBICORT) 80-4.5 MCG/ACT inhaler   buPROPion (WELLBUTRIN XL) 150 MG 24 hr tablet   buPROPion (WELLBUTRIN XL) 300 MG 24 hr tablet   furosemide (LASIX) 20 MG tablet   gabapentin (NEURONTIN) 300 MG capsule   Guselkumab (TREMFYA) 100 MG/ML SOPN   Insulin Pen Needle 31G X 5 MM MISC   omeprazole (PRILOSEC) 20 MG capsule   rizatriptan (MAXALT) 10 MG tablet   sertraline (ZOLOFT) 50 MG tablet   traMADol (ULTRAM) 50 MG tablet   traZODone (DESYREL) 100 MG tablet   No current facility-administered medications for this encounter.    Shonna Chock, PA-C Surgical Short Stay/Anesthesiology [facility] Phone [phone] [facility] Phone [phone] 01/08/2023 6:44 PM

## 2023-01-09 ENCOUNTER — Ambulatory Visit (HOSPITAL_COMMUNITY): Payer: Commercial Managed Care - PPO

## 2023-01-09 ENCOUNTER — Other Ambulatory Visit: Payer: Self-pay

## 2023-01-09 ENCOUNTER — Encounter (HOSPITAL_COMMUNITY)
Admission: RE | Disposition: A | Discharge status: Home / Self Care | Payer: Self-pay | Source: Home / Self Care | Attending: Neurosurgery

## 2023-01-09 ENCOUNTER — Ambulatory Visit (HOSPITAL_COMMUNITY)
Admission: RE | Admit: 2023-01-09 | Discharge: 2023-01-10 | Disposition: A | Discharge status: Home / Self Care | Payer: Commercial Managed Care - PPO | Attending: Neurosurgery | Admitting: Neurosurgery

## 2023-01-09 ENCOUNTER — Ambulatory Visit (HOSPITAL_COMMUNITY): Payer: Commercial Managed Care - PPO | Admitting: Vascular Surgery

## 2023-01-09 ENCOUNTER — Ambulatory Visit (HOSPITAL_BASED_OUTPATIENT_CLINIC_OR_DEPARTMENT_OTHER): Payer: Commercial Managed Care - PPO | Admitting: Anesthesiology

## 2023-01-09 DIAGNOSIS — N183 Chronic kidney disease, stage 3 unspecified: Secondary | ICD-10-CM | POA: Diagnosis not present

## 2023-01-09 DIAGNOSIS — M1990 Unspecified osteoarthritis, unspecified site: Secondary | ICD-10-CM

## 2023-01-09 DIAGNOSIS — M48061 Spinal stenosis, lumbar region without neurogenic claudication: Secondary | ICD-10-CM

## 2023-01-09 DIAGNOSIS — M5126 Other intervertebral disc displacement, lumbar region: Secondary | ICD-10-CM | POA: Diagnosis not present

## 2023-01-09 DIAGNOSIS — N189 Chronic kidney disease, unspecified: Secondary | ICD-10-CM

## 2023-01-09 DIAGNOSIS — M4326 Fusion of spine, lumbar region: Secondary | ICD-10-CM | POA: Diagnosis not present

## 2023-01-09 DIAGNOSIS — M532X6 Spinal instabilities, lumbar region: Secondary | ICD-10-CM | POA: Diagnosis not present

## 2023-01-09 DIAGNOSIS — Z950 Presence of cardiac pacemaker: Secondary | ICD-10-CM | POA: Diagnosis not present

## 2023-01-09 HISTORY — PX: SPINAL FUSION: SHX223

## 2023-01-09 LAB — ABO/RH: ABO/RH(D): A POS

## 2023-01-09 SURGERY — POSTERIOR LUMBAR FUSION 1 LEVEL
Anesthesia: General | Site: Back

## 2023-01-09 MED ORDER — ONDANSETRON HCL 4 MG PO TABS
4.0000 mg | ORAL_TABLET | Freq: Four times a day (QID) | ORAL | Status: DC | PRN
  Administered 2023-01-10: 4 mg via ORAL
  Filled 2023-01-09: qty 1

## 2023-01-09 MED ORDER — VANCOMYCIN HCL IN DEXTROSE 1-5 GM/200ML-% IV SOLN: 1000.0000 mg | INTRAVENOUS | Status: AC

## 2023-01-09 MED ORDER — CEFAZOLIN SODIUM-DEXTROSE 2-3 GM-%(50ML) IV SOLR
INTRAVENOUS | Status: DC | PRN
  Administered 2023-01-09: 2 g via INTRAVENOUS

## 2023-01-09 MED ORDER — BUPIVACAINE HCL (PF) 0.25 % IJ SOLN
INTRAMUSCULAR | Status: AC
  Filled 2023-01-09: qty 30

## 2023-01-09 MED ORDER — CHLORHEXIDINE GLUCONATE CLOTH 2 % EX PADS: 6.0000 | MEDICATED_PAD | Freq: Once | CUTANEOUS | Status: DC

## 2023-01-09 MED ORDER — BUPIVACAINE LIPOSOME 1.3 % IJ SUSP
INTRAMUSCULAR | Status: DC | PRN
  Administered 2023-01-09: 20 mL

## 2023-01-09 MED ORDER — ALBUTEROL SULFATE (2.5 MG/3ML) 0.083% IN NEBU: 2.5000 mg | INHALATION_SOLUTION | Freq: Four times a day (QID) | RESPIRATORY_TRACT | Status: DC | PRN

## 2023-01-09 MED ORDER — ONDANSETRON HCL 4 MG/2ML IJ SOLN
INTRAMUSCULAR | Status: DC | PRN
  Administered 2023-01-09: 4 mg via INTRAVENOUS

## 2023-01-09 MED ORDER — PROMETHAZINE HCL 25 MG/ML IJ SOLN
6.2500 mg | INTRAMUSCULAR | Status: DC | PRN
  Administered 2023-01-09: 6.25 mg via INTRAVENOUS

## 2023-01-09 MED ORDER — MIDAZOLAM HCL 2 MG/2ML IJ SOLN
INTRAMUSCULAR | Status: AC
  Filled 2023-01-09: qty 2

## 2023-01-09 MED ORDER — THROMBIN 20000 UNITS EX SOLR
CUTANEOUS | Status: DC | PRN
  Administered 2023-01-09: 20 mL via TOPICAL

## 2023-01-09 MED ORDER — SODIUM CHLORIDE 0.9% FLUSH
3.0000 mL | Freq: Two times a day (BID) | INTRAVENOUS | Status: DC
  Administered 2023-01-09: 3 mL via INTRAVENOUS

## 2023-01-09 MED ORDER — PHENYLEPHRINE HCL-NACL 20-0.9 MG/250ML-% IV SOLN
INTRAVENOUS | Status: DC | PRN
  Administered 2023-01-09: 30 ug/min via INTRAVENOUS

## 2023-01-09 MED ORDER — EPHEDRINE 5 MG/ML INJ
INTRAVENOUS | Status: AC
  Filled 2023-01-09: qty 5

## 2023-01-09 MED ORDER — MIDAZOLAM HCL 2 MG/2ML IJ SOLN
INTRAMUSCULAR | Status: DC | PRN
  Administered 2023-01-09: 2 mg via INTRAVENOUS

## 2023-01-09 MED ORDER — THROMBIN 20000 UNITS EX SOLR
CUTANEOUS | Status: AC
  Filled 2023-01-09: qty 20000

## 2023-01-09 MED ORDER — SODIUM CHLORIDE 0.9% FLUSH: 3.0000 mL | INTRAVENOUS | Status: DC | PRN

## 2023-01-09 MED ORDER — ACETAMINOPHEN 160 MG/5ML PO SOLN: 1000.0000 mg | Freq: Once | ORAL | Status: DC | PRN

## 2023-01-09 MED ORDER — ROCURONIUM BROMIDE 10 MG/ML (PF) SYRINGE
PREFILLED_SYRINGE | INTRAVENOUS | Status: DC | PRN
  Administered 2023-01-09: 30 mg via INTRAVENOUS
  Administered 2023-01-09: 70 mg via INTRAVENOUS
  Administered 2023-01-09: 30 mg via INTRAVENOUS

## 2023-01-09 MED ORDER — 0.9 % SODIUM CHLORIDE (POUR BTL) OPTIME
TOPICAL | Status: DC | PRN
  Administered 2023-01-09 (×2): 1000 mL

## 2023-01-09 MED ORDER — CEFAZOLIN SODIUM-DEXTROSE 2-4 GM/100ML-% IV SOLN
2.0000 g | Freq: Three times a day (TID) | INTRAVENOUS | Status: DC
  Administered 2023-01-09 – 2023-01-10 (×2): 2 g via INTRAVENOUS
  Filled 2023-01-09 (×2): qty 100

## 2023-01-09 MED ORDER — PROMETHAZINE HCL 25 MG/ML IJ SOLN
INTRAMUSCULAR | Status: AC
  Filled 2023-01-09: qty 1

## 2023-01-09 MED ORDER — PROPOFOL 10 MG/ML IV BOLUS
INTRAVENOUS | Status: DC | PRN
  Administered 2023-01-09: 120 mg via INTRAVENOUS

## 2023-01-09 MED ORDER — TRAMADOL HCL 50 MG PO TABS: 50.0000 mg | ORAL_TABLET | Freq: Four times a day (QID) | ORAL | Status: DC | PRN

## 2023-01-09 MED ORDER — ONDANSETRON HCL 4 MG/2ML IJ SOLN: 4.0000 mg | Freq: Four times a day (QID) | INTRAMUSCULAR | Status: DC | PRN

## 2023-01-09 MED ORDER — DEXAMETHASONE SODIUM PHOSPHATE 10 MG/ML IJ SOLN
INTRAMUSCULAR | Status: DC | PRN
  Administered 2023-01-09: 10 mg via INTRAVENOUS

## 2023-01-09 MED ORDER — THROMBIN 5000 UNITS EX SOLR
CUTANEOUS | Status: AC
  Filled 2023-01-09: qty 5000

## 2023-01-09 MED ORDER — BUPROPION HCL ER (XL) 300 MG PO TB24: 300.0000 mg | ORAL_TABLET | Freq: Every day | ORAL | Status: DC

## 2023-01-09 MED ORDER — CYCLOBENZAPRINE HCL 10 MG PO TABS
10.0000 mg | ORAL_TABLET | Freq: Three times a day (TID) | ORAL | Status: DC | PRN
  Administered 2023-01-09 – 2023-01-10 (×2): 10 mg via ORAL
  Filled 2023-01-09 (×2): qty 1

## 2023-01-09 MED ORDER — TRAZODONE HCL 50 MG PO TABS
200.0000 mg | ORAL_TABLET | Freq: Every day | ORAL | Status: DC
  Administered 2023-01-09: 200 mg via ORAL
  Filled 2023-01-09: qty 1

## 2023-01-09 MED ORDER — EPHEDRINE SULFATE-NACL 50-0.9 MG/10ML-% IV SOSY
PREFILLED_SYRINGE | INTRAVENOUS | Status: DC | PRN
  Administered 2023-01-09 (×3): 5 mg via INTRAVENOUS

## 2023-01-09 MED ORDER — ACETAMINOPHEN 10 MG/ML IV SOLN
INTRAVENOUS | Status: AC
  Filled 2023-01-09: qty 100

## 2023-01-09 MED ORDER — PANTOPRAZOLE SODIUM 40 MG IV SOLR: 40.0000 mg | Freq: Every day | INTRAVENOUS | Status: DC

## 2023-01-09 MED ORDER — ACETAMINOPHEN 325 MG PO TABS: 650.0000 mg | ORAL_TABLET | ORAL | Status: DC | PRN

## 2023-01-09 MED ORDER — FENTANYL CITRATE (PF) 250 MCG/5ML IJ SOLN
INTRAMUSCULAR | Status: AC
  Filled 2023-01-09: qty 5

## 2023-01-09 MED ORDER — SODIUM CHLORIDE 0.9 % IV SOLN: 250.0000 mL | INTRAVENOUS | Status: DC

## 2023-01-09 MED ORDER — OXYCODONE HCL 5 MG/5ML PO SOLN: 5.0000 mg | Freq: Once | ORAL | Status: DC | PRN

## 2023-01-09 MED ORDER — CHLORHEXIDINE GLUCONATE 0.12 % MT SOLN
OROMUCOSAL | Status: AC
  Administered 2023-01-09: 15 mL via OROMUCOSAL
  Filled 2023-01-09: qty 15

## 2023-01-09 MED ORDER — ACETAMINOPHEN 10 MG/ML IV SOLN
1000.0000 mg | Freq: Once | INTRAVENOUS | Status: DC | PRN
  Administered 2023-01-09: 1000 mg via INTRAVENOUS

## 2023-01-09 MED ORDER — DEXAMETHASONE SODIUM PHOSPHATE 10 MG/ML IJ SOLN
INTRAMUSCULAR | Status: AC
  Filled 2023-01-09: qty 1

## 2023-01-09 MED ORDER — ONDANSETRON HCL 4 MG/2ML IJ SOLN
INTRAMUSCULAR | Status: AC
  Filled 2023-01-09: qty 2

## 2023-01-09 MED ORDER — SERTRALINE HCL 50 MG PO TABS
50.0000 mg | ORAL_TABLET | Freq: Every evening | ORAL | Status: DC
  Administered 2023-01-09: 50 mg via ORAL
  Filled 2023-01-09: qty 1

## 2023-01-09 MED ORDER — MOMETASONE FURO-FORMOTEROL FUM 100-5 MCG/ACT IN AERO
2.0000 | INHALATION_SPRAY | Freq: Two times a day (BID) | RESPIRATORY_TRACT | Status: DC
  Filled 2023-01-09 (×3): qty 8.8

## 2023-01-09 MED ORDER — OXYCODONE HCL 5 MG PO TABS: 5.0000 mg | ORAL_TABLET | Freq: Once | ORAL | Status: DC | PRN

## 2023-01-09 MED ORDER — ACETAMINOPHEN 500 MG PO TABS: 1000.0000 mg | ORAL_TABLET | Freq: Once | ORAL | Status: DC | PRN

## 2023-01-09 MED ORDER — VANCOMYCIN HCL IN DEXTROSE 1-5 GM/200ML-% IV SOLN
INTRAVENOUS | Status: AC
  Administered 2023-01-09: 1000 mg via INTRAVENOUS
  Filled 2023-01-09: qty 200

## 2023-01-09 MED ORDER — HYDROCODONE-ACETAMINOPHEN 5-325 MG PO TABS
2.0000 | ORAL_TABLET | ORAL | Status: DC | PRN
  Administered 2023-01-09 – 2023-01-10 (×4): 2 via ORAL
  Filled 2023-01-09 (×4): qty 2

## 2023-01-09 MED ORDER — LIDOCAINE 2% (20 MG/ML) 5 ML SYRINGE
INTRAMUSCULAR | Status: DC | PRN
  Administered 2023-01-09: 60 mg via INTRAVENOUS

## 2023-01-09 MED ORDER — LIDOCAINE 2% (20 MG/ML) 5 ML SYRINGE
INTRAMUSCULAR | Status: AC
  Filled 2023-01-09: qty 5

## 2023-01-09 MED ORDER — INSULIN PEN NEEDLE 31G X 5 MM MISC: 1.0000 | Freq: Every day | Status: DC

## 2023-01-09 MED ORDER — ROCURONIUM BROMIDE 10 MG/ML (PF) SYRINGE
PREFILLED_SYRINGE | INTRAVENOUS | Status: AC
  Filled 2023-01-09: qty 10

## 2023-01-09 MED ORDER — PHENYLEPHRINE 80 MCG/ML (10ML) SYRINGE FOR IV PUSH (FOR BLOOD PRESSURE SUPPORT)
PREFILLED_SYRINGE | INTRAVENOUS | Status: DC | PRN
  Administered 2023-01-09: 80 ug via INTRAVENOUS

## 2023-01-09 MED ORDER — ATORVASTATIN CALCIUM 40 MG PO TABS
40.0000 mg | ORAL_TABLET | Freq: Every day | ORAL | Status: DC
  Administered 2023-01-09: 40 mg via ORAL
  Filled 2023-01-09: qty 1

## 2023-01-09 MED ORDER — BUPROPION HCL ER (XL) 300 MG PO TB24
450.0000 mg | ORAL_TABLET | Freq: Every day | ORAL | Status: DC
  Administered 2023-01-10: 450 mg via ORAL
  Filled 2023-01-09: qty 1

## 2023-01-09 MED ORDER — GABAPENTIN 300 MG PO CAPS
300.0000 mg | ORAL_CAPSULE | Freq: Every day | ORAL | Status: DC
  Administered 2023-01-09: 300 mg via ORAL
  Filled 2023-01-09: qty 1

## 2023-01-09 MED ORDER — FUROSEMIDE 20 MG PO TABS
20.0000 mg | ORAL_TABLET | Freq: Every day | ORAL | Status: DC
  Administered 2023-01-10: 20 mg via ORAL
  Filled 2023-01-09 (×2): qty 1

## 2023-01-09 MED ORDER — ACETAMINOPHEN 650 MG RE SUPP: 650.0000 mg | RECTAL | Status: DC | PRN

## 2023-01-09 MED ORDER — FENTANYL CITRATE (PF) 100 MCG/2ML IJ SOLN
INTRAMUSCULAR | Status: AC
  Filled 2023-01-09: qty 2

## 2023-01-09 MED ORDER — PHENYLEPHRINE 80 MCG/ML (10ML) SYRINGE FOR IV PUSH (FOR BLOOD PRESSURE SUPPORT)
PREFILLED_SYRINGE | INTRAVENOUS | Status: AC
  Filled 2023-01-09: qty 10

## 2023-01-09 MED ORDER — LACTATED RINGERS IV SOLN: INTRAVENOUS | Status: DC

## 2023-01-09 MED ORDER — PANTOPRAZOLE SODIUM 40 MG PO TBEC
40.0000 mg | DELAYED_RELEASE_TABLET | Freq: Every day | ORAL | Status: DC
  Administered 2023-01-09: 40 mg via ORAL
  Filled 2023-01-09: qty 1

## 2023-01-09 MED ORDER — PHENOL 1.4 % MT LIQD: 1.0000 | OROMUCOSAL | Status: DC | PRN

## 2023-01-09 MED ORDER — ALUM & MAG HYDROXIDE-SIMETH 200-200-20 MG/5ML PO SUSP: 30.0000 mL | Freq: Four times a day (QID) | ORAL | Status: DC | PRN

## 2023-01-09 MED ORDER — MENTHOL 3 MG MT LOZG: 1.0000 | LOZENGE | OROMUCOSAL | Status: DC | PRN

## 2023-01-09 MED ORDER — FENTANYL CITRATE (PF) 100 MCG/2ML IJ SOLN
25.0000 ug | INTRAMUSCULAR | Status: DC | PRN
  Administered 2023-01-09: 50 ug via INTRAVENOUS

## 2023-01-09 MED ORDER — SUGAMMADEX SODIUM 200 MG/2ML IV SOLN
INTRAVENOUS | Status: DC | PRN
  Administered 2023-01-09: 400 mg via INTRAVENOUS

## 2023-01-09 MED ORDER — FENTANYL CITRATE (PF) 250 MCG/5ML IJ SOLN
INTRAMUSCULAR | Status: DC | PRN
  Administered 2023-01-09 (×5): 50 ug via INTRAVENOUS

## 2023-01-09 MED ORDER — LIDOCAINE-EPINEPHRINE 1 %-1:100000 IJ SOLN
INTRAMUSCULAR | Status: DC | PRN
  Administered 2023-01-09: 10 mL

## 2023-01-09 MED ORDER — PANTOPRAZOLE SODIUM 40 MG PO TBEC: 40.0000 mg | DELAYED_RELEASE_TABLET | Freq: Every day | ORAL | Status: DC

## 2023-01-09 MED ORDER — LIDOCAINE-EPINEPHRINE 1 %-1:100000 IJ SOLN
INTRAMUSCULAR | Status: AC
  Filled 2023-01-09: qty 1

## 2023-01-09 MED ORDER — BUPIVACAINE LIPOSOME 1.3 % IJ SUSP
INTRAMUSCULAR | Status: AC
  Filled 2023-01-09: qty 20

## 2023-01-09 MED ORDER — BUPROPION HCL ER (XL) 150 MG PO TB24: 150.0000 mg | ORAL_TABLET | Freq: Every day | ORAL | Status: DC

## 2023-01-09 MED ORDER — ORAL CARE MOUTH RINSE: 15.0000 mL | Freq: Once | OROMUCOSAL | Status: AC

## 2023-01-09 MED ORDER — CHLORHEXIDINE GLUCONATE 0.12 % MT SOLN: 15.0000 mL | Freq: Once | OROMUCOSAL | Status: AC

## 2023-01-09 MED ORDER — HYDROMORPHONE HCL 1 MG/ML IJ SOLN
0.5000 mg | INTRAMUSCULAR | Status: DC | PRN
  Administered 2023-01-09 (×2): 0.5 mg via INTRAVENOUS
  Filled 2023-01-09 (×2): qty 0.5

## 2023-01-09 SURGICAL SUPPLY — 76 items
ADH SKN CLS APL DERMABOND .7 (GAUZE/BANDAGES/DRESSINGS) ×1
APL SKNCLS STERI-STRIP NONHPOA (GAUZE/BANDAGES/DRESSINGS) ×1
BAG COUNTER SPONGE SURGICOUNT (BAG) ×2 IMPLANT
BAG SPNG CNTER NS LX DISP (BAG) ×1
BASKET BONE COLLECTION (BASKET) ×2 IMPLANT
BENZOIN TINCTURE PRP APPL 2/3 (GAUZE/BANDAGES/DRESSINGS) ×2 IMPLANT
BLADE BONE MILL MEDIUM (MISCELLANEOUS) ×2 IMPLANT
BLADE CLIPPER SURG (BLADE) IMPLANT
BLADE SURG 11 STRL SS (BLADE) ×2 IMPLANT
BONE VIVIGEN FORMABLE 5.4CC (Bone Implant) ×1 IMPLANT
BUR CUTTER 7.0 ROUND (BURR) ×2 IMPLANT
BUR MATCHSTICK NEURO 3.0 LAGG (BURR) ×2 IMPLANT
CANISTER SUCT 3000ML PPV (MISCELLANEOUS) ×2 IMPLANT
CAP LOCKING THREADED (Cap) IMPLANT
CLAMP ROD TO ROD 5.5-6.5 (Rod) IMPLANT
CNTNR URN SCR LID CUP LEK RST (MISCELLANEOUS) ×2 IMPLANT
CONT SPEC 4OZ STRL OR WHT (MISCELLANEOUS) ×1
COVER BACK TABLE 60X90IN (DRAPES) ×2 IMPLANT
DERMABOND ADVANCED .7 DNX12 (GAUZE/BANDAGES/DRESSINGS) ×2 IMPLANT
DRAPE C-ARM 42X72 X-RAY (DRAPES) ×4 IMPLANT
DRAPE C-ARMOR (DRAPES) IMPLANT
DRAPE HALF SHEET 40X57 (DRAPES) IMPLANT
DRAPE LAPAROTOMY 100X72X124 (DRAPES) ×2 IMPLANT
DRAPE SURG 17X23 STRL (DRAPES) ×2 IMPLANT
DRSG OPSITE 4X5.5 SM (GAUZE/BANDAGES/DRESSINGS) ×2 IMPLANT
DRSG OPSITE POSTOP 4X6 (GAUZE/BANDAGES/DRESSINGS) ×2 IMPLANT
DURAPREP 26ML APPLICATOR (WOUND CARE) ×2 IMPLANT
ELECT BLADE 4.0 EZ CLEAN MEGAD (MISCELLANEOUS) ×1
ELECT REM PT RETURN 9FT ADLT (ELECTROSURGICAL) ×1
ELECTRODE BLDE 4.0 EZ CLN MEGD (MISCELLANEOUS) IMPLANT
ELECTRODE REM PT RTRN 9FT ADLT (ELECTROSURGICAL) ×2 IMPLANT
EVACUATOR 3/16  PVC DRAIN (DRAIN)
EVACUATOR 3/16 PVC DRAIN (DRAIN) IMPLANT
GAUZE 4X4 16PLY ~~LOC~~+RFID DBL (SPONGE) IMPLANT
GAUZE SPONGE 4X4 12PLY STRL (GAUZE/BANDAGES/DRESSINGS) ×2 IMPLANT
GLOVE BIO SURGEON STRL SZ7 (GLOVE) IMPLANT
GLOVE BIO SURGEON STRL SZ8 (GLOVE) ×4 IMPLANT
GLOVE BIOGEL PI IND STRL 7.0 (GLOVE) IMPLANT
GLOVE EXAM NITRILE XL STR (GLOVE) IMPLANT
GLOVE INDICATOR 8.5 STRL (GLOVE) ×4 IMPLANT
GOWN STRL REUS W/ TWL LRG LVL3 (GOWN DISPOSABLE) IMPLANT
GOWN STRL REUS W/ TWL XL LVL3 (GOWN DISPOSABLE) ×4 IMPLANT
GOWN STRL REUS W/TWL 2XL LVL3 (GOWN DISPOSABLE) IMPLANT
GOWN STRL REUS W/TWL LRG LVL3 (GOWN DISPOSABLE)
GOWN STRL REUS W/TWL XL LVL3 (GOWN DISPOSABLE) ×2
GRAFT BNE MATRIX VG FRMBL MD 5 (Bone Implant) IMPLANT
GRAFT BONE PROTEIOS LRG 5CC (Orthopedic Implant) IMPLANT
KIT BASIN OR (CUSTOM PROCEDURE TRAY) ×2 IMPLANT
KIT GRAFTMAG DEL NEURO DISP (NEUROSURGERY SUPPLIES) IMPLANT
KIT TURNOVER KIT B (KITS) ×2 IMPLANT
MILL BONE PREP (MISCELLANEOUS) ×2 IMPLANT
NDL HYPO 25X1 1.5 SAFETY (NEEDLE) ×2 IMPLANT
NEEDLE HYPO 25X1 1.5 SAFETY (NEEDLE) ×1 IMPLANT
NS IRRIG 1000ML POUR BTL (IV SOLUTION) ×2 IMPLANT
PACK LAMINECTOMY NEURO (CUSTOM PROCEDURE TRAY) ×2 IMPLANT
PAD ARMBOARD 7.5X6 YLW CONV (MISCELLANEOUS) ×6 IMPLANT
ROD 65MM SPINAL (Rod) ×1 IMPLANT
ROD CREO 60MM (Rod) IMPLANT
ROD SPNL 5.5 CREO TI 65 (Rod) IMPLANT
SCREW PA THRD CREO TULIP 5.5X4 (Head) IMPLANT
SHAFT CREO 30MM (Neuro Prosthesis/Implant) IMPLANT
SOL ELECTROSURG ANTI STICK (MISCELLANEOUS)
SOLUTION ELECTROSURG ANTI STCK (MISCELLANEOUS) ×2 IMPLANT
SPACER SUSTAIN RT 12 15D 9X26 (Spacer) IMPLANT
SPIKE FLUID TRANSFER (MISCELLANEOUS) ×2 IMPLANT
SPONGE SURGIFOAM ABS GEL 100 (HEMOSTASIS) ×2 IMPLANT
SPONGE T-LAP 4X18 ~~LOC~~+RFID (SPONGE) IMPLANT
STRIP CLOSURE SKIN 1/2X4 (GAUZE/BANDAGES/DRESSINGS) ×4 IMPLANT
SUT VIC AB 0 CT1 18XCR BRD8 (SUTURE) ×4 IMPLANT
SUT VIC AB 0 CT1 8-18 (SUTURE) ×2
SUT VIC AB 2-0 CT1 18 (SUTURE) ×2 IMPLANT
SUT VIC AB 4-0 PS2 27 (SUTURE) ×2 IMPLANT
TOWEL GREEN STERILE (TOWEL DISPOSABLE) ×2 IMPLANT
TOWEL GREEN STERILE FF (TOWEL DISPOSABLE) ×2 IMPLANT
TRAY FOLEY MTR SLVR 16FR STAT (SET/KITS/TRAYS/PACK) ×2 IMPLANT
WATER STERILE IRR 1000ML POUR (IV SOLUTION) ×2 IMPLANT

## 2023-01-09 NOTE — Anesthesia Procedure Notes (Signed)
Procedure Name: Intubation Date/Time: 01/09/2023 11:34 AM  Performed by: Orlin Hilding, CRNAPre-anesthesia Checklist: Patient identified, Emergency Drugs available, Suction available, Patient being monitored and Timeout performed Patient Re-evaluated:Patient Re-evaluated prior to induction Oxygen Delivery Method: Circle system utilized Preoxygenation: Pre-oxygenation with 100% oxygen Induction Type: IV induction Ventilation: Mask ventilation without difficulty Laryngoscope Size: Mac and 3 Grade View: Grade II Tube type: Oral Tube size: 7.0 mm Number of attempts: 1 Placement Confirmation: ETT inserted through vocal cords under direct vision, positive ETCO2 and breath sounds checked- equal and bilateral Secured at: 22 cm Tube secured with: Tape Dental Injury: Teeth and Oropharynx as per pre-operative assessment

## 2023-01-09 NOTE — Discharge Instructions (Signed)
Wound Care  Keep the incision clean and dry remove the outer dressing in 3 days, leave the Steri-Strips intact. Wrap with Saran wrap for showers only Do not put any creams, lotions, or ointments on incision. Leave steri-strips on back.  They will fall off by themselves.  Activity Walk each and every day, increasing distance each day. No lifting greater than 5 lbs.  No lifting no bending no twisting no driving or riding a car unless coming back and forth to see me. If provided with back brace, wear when out of bed.  It is not necessary to wear brace in bed. Diet Resume your normal diet.   Return to Work Will be discussed at you follow up appointment.  Call Your Doctor If Any of These Occur Redness, drainage, or swelling at the wound.  Temperature greater than 101 degrees. Severe pain not relieved by pain medication. Incision starts to come apart. Follow Up Appt Call  [phone])  for problems.  If you have any hardware placed in your spine, you will need an x-ray before your appointment.   

## 2023-01-09 NOTE — Progress Notes (Signed)
Per [employer] rep Suddel there is no need for him to see patient before discharge from PACU due to nothing being changed with AICD preop or intra-op.

## 2023-01-09 NOTE — Anesthesia Preprocedure Evaluation (Addendum)
Anesthesia Evaluation  Patient identified by MRN, date of birth, ID band Patient awake    Reviewed: Allergy & Precautions, NPO status , Patient's Chart, lab work & pertinent test results  History of Anesthesia Complications (+) PONV and history of anesthetic complications  Airway Mallampati: III  TM Distance: >3 FB Neck ROM: Full    Dental  (+) Teeth Intact, Dental Advisory Given   Pulmonary    breath sounds clear to auscultation       Cardiovascular (-) hypertension(-) angina (-) Past MI + pacemaker  Rhythm:Regular  Left ventricle: The cavity size was normal. Systolic function was    normal. The estimated ejection fraction was in the range of 60%    to 65%. Wall motion was normal; there were no regional wall    motion abnormalities. Left ventricular diastolic function    parameters were normal.  - Left atrium: The atrium was normal in size.  - Right ventricle: Pacer wire or catheter noted in right ventricle.    Systolic function was normal.  - Pulmonary arteries: Systolic pressure was within the normal    range.     Neuro/Psych    GI/Hepatic negative GI ROS,,,  Endo/Other  negative endocrine ROS    Renal/GU CRFRenal diseaseLab Results      Component                Value               Date                      CREATININE               1.03 (H)            01/08/2023           Lab Results      Component                Value               Date                      K                        3.9                 01/08/2023                Musculoskeletal  (+) Arthritis ,    Abdominal   Peds  Hematology negative hematology ROS (+) Lab Results      Component                Value               Date                      WBC                      8.1                 01/08/2023                HGB                      13.7  01/08/2023                HCT                      40.8                01/08/2023                 MCV                      95.3                01/08/2023                PLT                      278                 01/08/2023              Anesthesia Other Findings   Reproductive/Obstetrics                             Anesthesia Physical Anesthesia Plan  ASA: 2  Anesthesia Plan: General   Post-op Pain Management: Ofirmev IV (intra-op)*   Induction:   PONV Risk Score and Plan: 4 or greater and Ondansetron, Dexamethasone and Propofol infusion  Airway Management Planned: Oral ETT  Additional Equipment: None  Intra-op Plan:   Post-operative Plan: Extubation in OR  Informed Consent: I have reviewed the patients History and Physical, chart, labs and discussed the procedure including the risks, benefits and alternatives for the proposed anesthesia with the patient or authorized representative who has indicated his/her understanding and acceptance.     Dental advisory given  Plan Discussed with: CRNA  Anesthesia Plan Comments:        Anesthesia Quick Evaluation

## 2023-01-09 NOTE — Progress Notes (Addendum)
Called [employer] to make sure representative aware of patient's surgery time.  1610 Received call back from [place].

## 2023-01-09 NOTE — H&P (Signed)
Emma Rogers is an 57 y.o. female.   Chief Complaint: Back bilateral hip and leg pain HPI: 57 year old female progressive worsening back bilateral hip and leg pain workup revealed complete myelographic block at L3-4 above her previous fusion at L4-5.  Due to patient's progression of clinical syndrome imaging findings and failed conservative treatment I recommended decompressive laminectomy and interbody fusion at L3-4.  I extensively reviewed the risks and benefits of the operation with her as well as perioperative course expectations of outcome and alternatives to surgery and she understood and agreed to proceed forward.  Past Medical History:  Diagnosis Date   Abnormal MRI 04/03/2019   Allergic rhinitis    Anxiety    Asthma    Depression    Headache(784.0)    History of iritis 11/27/2020   History of kidney stones    PONV (postoperative nausea and vomiting)    Presence of permanent cardiac pacemaker    for neurocardiogenic sycnope; last implalnt 12/19/09 ([employer]tr[employer])   Psoriatic arthritis    Renal insufficiency    Stage 3 kidney disease.    Shingles     Past Surgical History:  Procedure Laterality Date   BREAST BIOPSY Left 09/30/2002   Ductogram-neg   INSERT / REPLACE / REMOVE PACEMAKER     Permanent pacemaker, Dr. [name]   Lobe hemithroidectomy     Right   LUMBAR FUSION  2004   w/ Dr. Wynetta Emery   LUMBAR LAMINECTOMY  1989   TOTAL ABDOMINAL HYSTERECTOMY  2009   still has 1 ovary    Family History  Problem Relation Age of Onset   Rheum arthritis Mother    Asthma Mother    Heart disease Father        pacemaker,arrythmias   Lung disease Father    COPD Maternal Grandmother    Breast cancer Maternal Grandmother    Heart disease Maternal Grandfather    Liver disease Paternal Grandmother    Heart disease Paternal Grandfather    Asthma Other    Coronary artery disease Other        Female 1st degree relative <50   Lung cancer Other    Prostate cancer Other        1st degree  relative <50   Social History:  reports that she has never smoked. She has never used smokeless tobacco. She reports that she does not drink alcohol and does not use drugs.  Allergies:  Allergies  Allergen Reactions   Augmentin [Amoxicillin-Pot Clavulanate] Diarrhea and Nausea And Vomiting    Mostly Nausea and Upset Stomach    Morphine And Related     vomiting    Medications Prior to Admission  Medication Sig Dispense Refill   atorvastatin (LIPITOR) 40 MG tablet Take 1 tablet (40 mg total) by mouth at bedtime. OFFICE VISIT NEEDED FOR ADDITIONAL REFILLS 90 tablet 0   budesonide-formoterol (SYMBICORT) 80-4.5 MCG/ACT inhaler Inhale 2 puffs into the lungs 2 (two) times daily. 10.2 g 2   buPROPion (WELLBUTRIN XL) 150 MG 24 hr tablet Take 1 tablet (150 mg total) by mouth daily. 90 tablet 1   buPROPion (WELLBUTRIN XL) 300 MG 24 hr tablet Take 1 tablet (300 mg total) by mouth daily. 90 tablet 1   furosemide (LASIX) 20 MG tablet Take 1 tablet (20 mg total) by mouth daily. OFFICE VISIT NEEDED FOR ADDITIONAL REFILLS 90 tablet 0   gabapentin (NEURONTIN) 300 MG capsule Take 1 capsule (300 mg total) by mouth at bedtime. 30 capsule 3   sertraline (  ZOLOFT) 50 MG tablet Take 1 tablet (50 mg total) by mouth daily. (Patient taking differently: Take 50 mg by mouth every evening.) 90 tablet 1   traMADol (ULTRAM) 50 MG tablet Take 1 tablet (50 mg total) by mouth every 6 (six) hours. (Patient taking differently: Take 50 mg by mouth every 6 (six) hours as needed for severe pain or moderate pain.) 30 tablet 0   traZODone (DESYREL) 100 MG tablet Take 2 tablets (200 mg total) by mouth at bedtime. 180 tablet 1   albuterol (VENTOLIN HFA) 108 (90 Base) MCG/ACT inhaler Inhale 2 puffs into the lungs every 6 (six) hours as needed for wheezing or shortness of breath. 18 g 2   Guselkumab (TREMFYA) 100 MG/ML SOPN Inject 100 mg into the skin every 8 weeks 1 mL 1   Insulin Pen Needle 31G X 5 MM MISC Use as directed. 100 each 12    omeprazole (PRILOSEC) 20 MG capsule Take 1 capsule (20 mg total) by mouth daily. (Patient taking differently: Take 20 mg by mouth daily as needed (acid reflux).) 30 capsule 3   rizatriptan (MAXALT) 10 MG tablet Take 1 tablet (10 mg total) by mouth as needed for migraine. May repeat in 2 hours if needed 10 tablet 2    Results for orders placed or performed during the hospital encounter of 01/09/23 (from the past 48 hour(s))  ABO/Rh     Status: None   Collection Time: 01/09/23  8:35 AM  Result Value Ref Range   ABO/RH(D)      A POS Performed at [facility], [address], [place], [place] [address]    No results found.  Review of Systems  Musculoskeletal:  Positive for back pain.  Neurological:  Positive for numbness.    Blood pressure (!) 154/96, pulse 67, temperature 98.9 F (37.2 C), temperature source Oral, resp. rate 16, height  (1.651 m), weight 90.7 kg, SpO2 98 %. Physical Exam HENT:     Head: Normocephalic.     Right Ear: Tympanic membrane normal.     Nose: Nose normal.     Mouth/Throat:     Mouth: Mucous membranes are moist.  Eyes:     Pupils: Pupils are equal, round, and reactive to light.  Cardiovascular:     Rate and Rhythm: Normal rate.  Pulmonary:     Effort: Pulmonary effort is normal.  Abdominal:     General: Abdomen is flat.  Musculoskeletal:        General: Normal range of motion.  Skin:    General: Skin is warm.  Neurological:     Mental Status: She is alert.     Comments: Patient is awake and alert strength is 5 out of 5 iliopsoas, quads, hamstrings, gastroc, into tibialis, and EHL.      Assessment/Plan 57 year old presents for L3-4 posterior lumbar interbody fusion  Emma Dollar, MD 01/09/2023, 10:37 AM

## 2023-01-09 NOTE — Transfer of Care (Signed)
Immediate Anesthesia Transfer of Care Note  Patient: Emma Rogers  Procedure(s) Performed: Posterior Lumbar Interbody Fusion  - Lumar three-Lumbar four - Posterior Lateral and Interbody fusion (Back)  Patient Location: PACU  Anesthesia Type:General  Level of Consciousness: drowsy and patient cooperative  Airway & Oxygen Therapy: Patient Spontanous Breathing and Patient connected to face mask oxygen  Post-op Assessment: Report given to RN and Post -op Vital signs reviewed and stable  Post vital signs: Reviewed and stable  Last Vitals:  Vitals Value Taken Time  BP 118/54 01/09/23 1425  Temp    Pulse 77 01/09/23 1429  Resp 14 01/09/23 1429  SpO2 98 % 01/09/23 1429  Vitals shown include unvalidated device data.  Last Pain:  Vitals:   01/09/23 0843  TempSrc:   PainSc: 0-No pain         Complications: No notable events documented.

## 2023-01-09 NOTE — Op Note (Signed)
Preoperative diagnosis: Lumbar spinal stenosis L3-4 with virtual complete myelographic block at that level with instability.  Postoperative diagnosis: Same.  Procedure: #1 decompressive lumbar laminectomy L3-4 with complete medial facetectomies radical foraminotomies of the L4 and L5 nerve roots in excess and requiring more work than would be needed with a standard interbody fusion.  2.  Posterior lumbar interbody fusion utilizing the globus insert and rotate titanium cages packed with locally harvested autograft mixed with Vivigen.  3.  Cortical screw fixation with new globus Creo amp modular cortical screws placed at L3 tying into her previous old construct believed to be [employer]onic M10 at L4-5 utilizing globus addition set.  Surgeon: Donalee Citrin.  Assistant: Julien Girt.  Anesthesia: General.  EBL: Minimal.  HPI: 57 year old female previously undergone L4-5 fusion did very well it was 20 years ago presented with progressive worsening back and bilateral hip and leg pain and myelogram showing a virtual complete block at L3-4 from severe stenosis.  Due to her progression of clinical syndrome imaging findings of a conservative treatment I recommended decompressive laminectomy and interbody fusion at L3-4 tying into her old construct at L4-5.  I extensively reviewed the risks and benefits of the operation with her as well as perioperative course expectations of outcome and alternatives of surgery and she understood and agreed to proceed forward.  Operative procedure: Patient was brought into the OR was induced under general anesthesia positioned prone the Wilson frame her back was prepped and draped in routine sterile fashion roll incision was opened up and extended cephalad subperiosteal dissection was carried lamina of L3 and then I exposed the old hardware from L4-5.  Then I took the spinous process at L3 off again a central decompression performed complete medial facetectomies extensive scar  tissue marked hourglass compression thecal sac was immediately identified this was all teased off of the dura and removed in piecemeal fashion blew through the pars and aggressively under bit the superior tickling facet performing radical foraminotomies of the L3 nerve roots as well as redo foraminotomies of the L4 nerve roots bilaterally.  Then disc base was inspected and cleaned out bilaterally several large fragments of disc removed from the central compartment as well as laterally underneath the nerve roots then after adequate endplate preparation I placed titanium cages packed with locally harvested autograft mixed with the Vivigen and Proteus with an extensive mount of autograft mix centrally then placed 2 cortical screws under fluoroscopy and L3 all screws had excellent purchase fluoroscopy confirmed good position of all the implants then utilizing the globus addition set I tied into the previous quarter inch 6.35 system with double open mouth connectors anchored everything in place reinspected the foramina to confirm patency and no migration of graft material lay Gelfoam and top of the dura injected Exparel in the fascia and placed a medium Hemovac drain the wound was then closed with interrupted Vicryl and a running 4 subcuticular.  Dermabond benzoin Steri-Strips and a sterile dressing was applied patient recovery in stable condition.  At the end of the case all needle count sponge counts were correct.

## 2023-01-10 DIAGNOSIS — N183 Chronic kidney disease, stage 3 unspecified: Secondary | ICD-10-CM | POA: Diagnosis not present

## 2023-01-10 DIAGNOSIS — M48061 Spinal stenosis, lumbar region without neurogenic claudication: Secondary | ICD-10-CM | POA: Diagnosis not present

## 2023-01-10 DIAGNOSIS — Z950 Presence of cardiac pacemaker: Secondary | ICD-10-CM | POA: Diagnosis not present

## 2023-01-10 MED ORDER — CYCLOBENZAPRINE HCL 10 MG PO TABS: 10.0000 mg | ORAL_TABLET | Freq: Three times a day (TID) | ORAL | 0 refills | Status: DC | PRN

## 2023-01-10 MED ORDER — OXYCODONE HCL 10 MG PO TABS: 5.0000 mg | ORAL_TABLET | ORAL | 0 refills | Status: DC | PRN

## 2023-01-10 NOTE — Plan of Care (Signed)
  Problem: Education: Goal: Ability to verbalize activity precautions or restrictions will improve Outcome: Completed/Met Goal: Knowledge of the prescribed therapeutic regimen will improve Outcome: Completed/Met Goal: Understanding of discharge needs will improve Outcome: Completed/Met   Problem: Activity: Goal: Ability to avoid complications of mobility impairment will improve Outcome: Completed/Met Goal: Ability to tolerate increased activity will improve Outcome: Completed/Met Goal: Will remain free from falls Outcome: Completed/Met   Problem: Bowel/Gastric: Goal: Gastrointestinal status for postoperative course will improve Outcome: Completed/Met   Problem: Clinical Measurements: Goal: Ability to maintain clinical measurements within normal limits will improve Outcome: Completed/Met Goal: Postoperative complications will be avoided or minimized Outcome: Completed/Met Goal: Diagnostic test results will improve Outcome: Completed/Met   Problem: Pain Management: Goal: Pain level will decrease Outcome: Completed/Met   Problem: Skin Integrity: Goal: Will show signs of wound healing Outcome: Completed/Met   Problem: Health Behavior/Discharge Planning: Goal: Identification of resources available to assist in meeting health care needs will improve Outcome: Completed/Met   Problem: Bladder/Genitourinary: Goal: Urinary functional status for postoperative course will improve Outcome: Completed/Met Patient alert and oriented, void, ambulate. Surgical site clean and dry. D/c instructions explain and given to the patient.

## 2023-01-10 NOTE — Evaluation (Signed)
Physical Therapy Evaluation and Discharge Patient Details Name: Emma Rogers MRN: [id number] DOB: 1966/06/11 Today's Date: 01/10/2023  History of Present Illness  Pt is a 57 y/o F s/p decompressive lumbar laminectomy L3-4 and PLIF on 4/19. PMH Includes anxiety, asthma, depression, pacemaker, renal insufficiency, psoriatic arthritis, back surgery.  Clinical Impression  Patient evaluated by Physical Therapy with no further acute PT needs identified. Pt reports back pain with radicular symptoms to BLE's; denies numbness/tingling. Displays mild LLE weakness in comparison to RLE. Overall is mobilizing fairly. Ambulating 200 ft with a walker modI. Demonstrates improved gait speed and fluidity with RW in comparison to without, so recommended for initial mobility. Education provided regarding spinal precautions, brace use, car transfer technique, activity recommendations. All education has been completed and the patient has no further questions. No follow-up Physical Therapy or equipment needs. PT is signing off. Thank you for this referral.      Recommendations for follow up therapy are one component of a multi-disciplinary discharge planning process, led by the attending physician.  Recommendations may be updated based on patient status, additional functional criteria and insurance authorization.  Follow Up Recommendations       Assistance Recommended at Discharge PRN  Patient can return home with the following  Assistance with cooking/housework;Assist for transportation;Help with stairs or ramp for entrance    Equipment Recommendations Rolling walker (2 wheels)  Recommendations for Other Services       Functional Status Assessment Patient has had a recent decline in their functional status and demonstrates the ability to make significant improvements in function in a reasonable and predictable amount of time.     Precautions / Restrictions Precautions Precautions: Back Precaution Booklet  Issued: Yes (comment) Precaution Comments: educated pt on 3/3 back prec Required Braces or Orthoses: Spinal Brace (per orders pt does not need brace, however brace present in room) Spinal Brace: Applied in sitting position Restrictions Weight Bearing Restrictions: No      Mobility  Bed Mobility Overal bed mobility: Modified Independent                  Transfers Overall transfer level: Modified independent Equipment used: Rolling walker (2 wheels)               General transfer comment: slow to rise    Ambulation/Gait Ambulation/Gait assistance: Modified independent (Device/Increase time) Gait Distance (Feet): 200 Feet Assistive device: Rolling walker (2 wheels), None Gait Pattern/deviations: Step-through pattern, Decreased step length - left, Decreased stride length Gait velocity: decreased     General Gait Details: Slow and steady pace, good posture throughout, min cues for equal step lengths. Pt with improved gait speed/fluidity with RW compared to without  Stairs            Wheelchair Mobility    Modified Rankin (Stroke Patients Only)       Balance Overall balance assessment: Needs assistance Sitting-balance support: Feet supported Sitting balance-Leahy Scale: Good     Standing balance support: Reliant on assistive device for balance, Bilateral upper extremity supported Standing balance-Leahy Scale: Fair                               Pertinent Vitals/Pain Pain Assessment Pain Assessment: Faces Faces Pain Scale: Hurts even more Pain Location: back at incision, BLE's (LLE > RLE) Pain Descriptors / Indicators: Discomfort Pain Intervention(s): Monitored during session    Home Living Family/patient expects to be discharged to:: Private residence  Living Arrangements: Spouse/significant other Available Help at Discharge: Family;Available PRN/intermittently (daughter also lives nearby) Type of Home: House Home Access: Stairs to  enter Entrance Stairs-Rails: Can reach both Entrance Stairs-Number of Steps: 4   Home Layout: One level Home Equipment: Cane - single point;Shower seat      Prior Function Prior Level of Function : Independent/Modified Independent             Mobility Comments: use of cane for mobility ADLs Comments: PRN assist     Hand Dominance        Extremity/Trunk Assessment   Upper Extremity Assessment Upper Extremity Assessment: Overall WFL for tasks assessed    Lower Extremity Assessment Lower Extremity Assessment: RLE deficits/detail;LLE deficits/detail RLE Deficits / Details: Grossly 4/5 LLE Deficits / Details: Grossly 4-/5 proximally, 4/5 distally    Cervical / Trunk Assessment Cervical / Trunk Assessment: Back Surgery  Communication   Communication: No difficulties  Cognition Arousal/Alertness: Awake/alert Behavior During Therapy: WFL for tasks assessed/performed Overall Cognitive Status: Within Functional Limits for tasks assessed                                          General Comments General comments (skin integrity, edema, etc.): VSS on RA, spouse present    Exercises     Assessment/Plan    PT Assessment Patient does not need any further PT services  PT Problem List         PT Treatment Interventions      PT Goals (Current goals can be found in the Care Plan section)  Acute Rehab PT Goals Patient Stated Goal: return to baseline PT Goal Formulation: All assessment and education complete, DC therapy    Frequency       Co-evaluation               AM-PAC PT "6 Clicks" Mobility  Outcome Measure Help needed turning from your back to your side while in a flat bed without using bedrails?: None Help needed moving from lying on your back to sitting on the side of a flat bed without using bedrails?: None Help needed moving to and from a bed to a chair (including a wheelchair)?: None Help needed standing up from a chair using  your arms (e.g., wheelchair or bedside chair)?: None Help needed to walk in hospital room?: None Help needed climbing 3-5 steps with a railing? : A Little 6 Click Score: 23    End of Session Equipment Utilized During Treatment: Back brace (for pt comfort) Activity Tolerance: Patient tolerated treatment well Patient left: in bed;with call bell/phone within reach;with family/visitor present Nurse Communication: Mobility status PT Visit Diagnosis: Pain;Unsteadiness on feet (R26.81) Pain - part of body:  (back)    Time: 4098-1191 PT Time Calculation (min) (ACUTE ONLY): 14 min   Charges:   PT Evaluation $PT Eval Low Complexity: 1 Low          Lillia Pauls, PT, DPT Acute Rehabilitation Services Office [phone]   Norval Morton 01/10/2023, 10:22 AM

## 2023-01-10 NOTE — Progress Notes (Signed)
Patient alert and oriented, void ambulate. Surgical site clean and dry. D/c instructions explain and given.pt. d/c home with walker per order.

## 2023-01-10 NOTE — Evaluation (Signed)
Occupational Therapy Evaluation Patient Details Name: Emma Rogers MRN: [id number] DOB: 07-14-1966 Today's Date: 01/10/2023   History of Present Illness Pt is a 57 y/o F s/p decompressive lumbar laminectomy L3-4 and PLIF on 4/19. PMH Includes anxiety, asthma, depression, pacemaker, renal insufficiency, psoriatic arthritis, back surgery.   Clinical Impression   Pt reports use of SPC at baseline for mobility, has PRN assist from spouse for ADLs, lives with spouse and also reports daughter is nearby to help. Pt currently needing set up -mod A for ADLs, supervision for bed mobility, and min guard for transfers with RW. Pt educated brace wear, back precautions, and compensatory strategies for ADLs. Pt able to demo with use of AE. Pt presenting with impairments listed below, will follow acutely. Recommend HHOT at d/c.     Recommendations for follow up therapy are one component of a multi-disciplinary discharge planning process, led by the attending physician.  Recommendations may be updated based on patient status, additional functional criteria and insurance authorization.   Assistance Recommended at Discharge Intermittent Supervision/Assistance  Patient can return home with the following A little help with walking and/or transfers;A lot of help with bathing/dressing/bathroom;Assistance with cooking/housework;Assist for transportation;Help with stairs or ramp for entrance    Functional Status Assessment  Patient has had a recent decline in their functional status and demonstrates the ability to make significant improvements in function in a reasonable and predictable amount of time.  Equipment Recommendations  Other (comment) (RW)    Recommendations for Other Services PT consult     Precautions / Restrictions Precautions Precautions: Back Precaution Booklet Issued: Yes (comment) Precaution Comments: educated pt on 3/3 back prec Required Braces or Orthoses: Spinal Brace (per orders pt does  not need brace, however brace present in room and educated on brace wear, MD verbally told this OT pt can wear brace) Spinal Brace: Applied in sitting position Restrictions Weight Bearing Restrictions: No      Mobility Bed Mobility Overal bed mobility: Needs Assistance Bed Mobility: Sidelying to Sit, Sit to Sidelying   Sidelying to sit: Supervision     Sit to sidelying: Supervision General bed mobility comments: use of log roll technique    Transfers Overall transfer level: Needs assistance Equipment used: Rolling walker (2 wheels) Transfers: Sit to/from Stand Sit to Stand: Min guard                  Balance Overall balance assessment: Needs assistance Sitting-balance support: Feet supported Sitting balance-Leahy Scale: Good     Standing balance support: Reliant on assistive device for balance, Bilateral upper extremity supported Standing balance-Leahy Scale: Fair Standing balance comment: stands statically with unilateral UE support, reliant on external support for ambulatio                           ADL either performed or assessed with clinical judgement   ADL Overall ADL's : Needs assistance/impaired Eating/Feeding: Set up   Grooming: Min guard   Upper Body Bathing: Minimal assistance   Lower Body Bathing: Moderate assistance   Upper Body Dressing : Minimal assistance   Lower Body Dressing: Moderate assistance   Toilet Transfer: Rolling walker (2 wheels);Min guard   Toileting- Clothing Manipulation and Hygiene: Min guard       Functional mobility during ADLs: Min guard;Rolling walker (2 wheels)       Vision   Vision Assessment?: No apparent visual deficits     Perception Perception Perception Tested?: No  Praxis Praxis Praxis tested?: Not tested    Pertinent Vitals/Pain Pain Assessment Pain Assessment: Faces Pain Score: 6  Faces Pain Scale: Hurts even more Pain Location: back at incision Pain Descriptors / Indicators:  Discomfort Pain Intervention(s): Limited activity within patient's tolerance, Monitored during session, Repositioned     Hand Dominance     Extremity/Trunk Assessment Upper Extremity Assessment Upper Extremity Assessment: Overall WFL for tasks assessed   Lower Extremity Assessment Lower Extremity Assessment: Generalized weakness   Cervical / Trunk Assessment Cervical / Trunk Assessment: Back Surgery   Communication Communication Communication: No difficulties   Cognition Arousal/Alertness: Awake/alert Behavior During Therapy: WFL for tasks assessed/performed Overall Cognitive Status: Within Functional Limits for tasks assessed                                       General Comments  VSS on RA, spouse present    Exercises     Shoulder Instructions      Home Living Family/patient expects to be discharged to:: Private residence Living Arrangements: Spouse/significant other Available Help at Discharge: Family;Available PRN/intermittently (daughter also lives nearby) Type of Home: House Home Access: Stairs to enter [employer] of Steps: 4 Entrance Stairs-Rails: Can reach both Home Layout: One level     Bathroom Shower/Tub: Chief Strategy Officer: Handicapped height     Home Equipment: Cane - single point;Shower seat          Prior Functioning/Environment Prior Level of Function : Independent/Modified Independent             Mobility Comments: use of cane for mobility ADLs Comments: PRN assist        OT Problem List: Decreased strength;Decreased range of motion;Impaired balance (sitting and/or standing);Decreased activity tolerance      OT Treatment/Interventions: Self-care/ADL training;Therapeutic exercise;Energy conservation;DME and/or AE instruction;Therapeutic activities;Balance training;Patient/family education    OT Goals(Current goals can be found in the care plan section) Acute Rehab OT Goals Patient  Stated Goal: none stated OT Goal Formulation: With patient Time For Goal Achievement: 01/24/23 Potential to Achieve Goals: Good  OT Frequency: Min 1X/week    Co-evaluation              AM-PAC OT "6 Clicks" Daily Activity     Outcome Measure Help from another person eating meals?: None Help from another person taking care of personal grooming?: A Little Help from another person toileting, which includes using toliet, bedpan, or urinal?: A Little Help from another person bathing (including washing, rinsing, drying)?: A Lot Help from another person to put on and taking off regular upper body clothing?: A Little Help from another person to put on and taking off regular lower body clothing?: A Lot 6 Click Score: 17   End of Session Equipment Utilized During Treatment: Gait belt;Back brace;Rolling walker (2 wheels);Other (comment) [facility]) Nurse Communication: Mobility status  Activity Tolerance: Patient tolerated treatment well Patient left: in bed;with call bell/phone within reach;with family/visitor present  OT Visit Diagnosis: Unsteadiness on feet (R26.81);Other abnormalities of gait and mobility (R26.89);Muscle weakness (generalized) (M62.81);History of falling (Z91.81)                Time: 1610-9604 OT Time Calculation (min): 35 min Charges:  OT General Charges $OT Visit: 1 Visit OT Evaluation $OT Eval Low Complexity: 1 Low OT Treatments $Self Care/Home Management : 8-22 mins  Carver Fila, OTD, OTR/L [employer] Preferred Acute Rehab (  [phone]  Dalphine Handing 01/10/2023, 9:18 AM

## 2023-01-10 NOTE — Discharge Summary (Signed)
Physician Discharge Summary  Patient ID: Emma Rogers MRN: [id number] DOB/AGE: 1966-05-20 57 y.o.  Admit date: 01/09/2023 Discharge date: 01/10/2023  Admission Diagnoses:  Discharge Diagnoses:  Principal Problem:   Spinal stenosis of lumbar region   Discharged Condition: good  Hospital Course: Patient admitted to the hospital where she underwent uncomplicated lumbar decompression and fusion.  Postoperatively doing well.  Back pain well-controlled.  No lower extremity symptoms.  Standing ambulating and voiding without difficulty.  Ready for discharge home.  Consults:   Significant Diagnostic Studies:   Treatments:   Discharge Exam: Blood pressure (!) 99/54, pulse 67, temperature 98.7 F (37.1 C), temperature source Oral, resp. rate 20, height  (1.651 m), weight 90.7 kg, SpO2 91 %. Awake and alert.  Oriented and appropriate.  Motor and sensory function intact.  Wound clean and dry.  Chest and abdomen benign.  Disposition: Discharge disposition: 01-Home or Self Care        Allergies as of 01/10/2023       Reactions   Augmentin [amoxicillin-pot Clavulanate] Diarrhea, Nausea And Vomiting   Mostly Nausea and Upset Stomach    Morphine And Related    vomiting        Medication List     TAKE these medications    albuterol 108 (90 Base) MCG/ACT inhaler Commonly known as: VENTOLIN HFA Inhale 2 puffs into the lungs every 6 (six) hours as needed for wheezing or shortness of breath.   atorvastatin 40 MG tablet Commonly known as: LIPITOR Take 1 tablet (40 mg total) by mouth at bedtime. OFFICE VISIT NEEDED FOR ADDITIONAL REFILLS   buPROPion 300 MG 24 hr tablet Commonly known as: WELLBUTRIN XL Take 1 tablet (300 mg total) by mouth daily.   buPROPion 150 MG 24 hr tablet Commonly known as: Wellbutrin XL Take 1 tablet (150 mg total) by mouth daily.   cyclobenzaprine 10 MG tablet Commonly known as: FLEXERIL Take 1 tablet (10 mg total) by mouth 3 (three) times  daily as needed for muscle spasms.   furosemide 20 MG tablet Commonly known as: LASIX Take 1 tablet (20 mg total) by mouth daily. OFFICE VISIT NEEDED FOR ADDITIONAL REFILLS   gabapentin 300 MG capsule Commonly known as: NEURONTIN Take 1 capsule (300 mg total) by mouth at bedtime.   omeprazole 20 MG capsule Commonly known as: PRILOSEC Take 1 capsule (20 mg total) by mouth daily. What changed:  when to take this reasons to take this   Oxycodone HCl 10 MG Tabs Take 0.5-1 tablets (5-10 mg total) by mouth every 4 (four) hours as needed.   rizatriptan 10 MG tablet Commonly known as: MAXALT Take 1 tablet (10 mg total) by mouth as needed for migraine. May repeat in 2 hours if needed   sertraline 50 MG tablet Commonly known as: ZOLOFT Take 1 tablet (50 mg total) by mouth daily. What changed: when to take this   Symbicort 80-4.5 MCG/ACT inhaler Generic drug: budesonide-formoterol Inhale 2 puffs into the lungs 2 (two) times daily.   traMADol 50 MG tablet Commonly known as: ULTRAM Take 1 tablet (50 mg total) by mouth every 6 (six) hours. What changed:  when to take this reasons to take this   traZODone 100 MG tablet Commonly known as: DESYREL Take 2 tablets (200 mg total) by mouth at bedtime.   Tremfya 100 MG/ML Sopn Generic drug: Guselkumab Inject 100 mg into the skin every 8 weeks   Unifine Pentips 31G X 5 MM Misc Generic drug: Insulin Pen Needle  Use as directed.        Follow-up Information     Donalee Citrin, MD. Call.   Specialty: Neurosurgery Why: As needed, If symptoms worsen Contact information: [address] [place] [address] [phone]                 Signed: Temple Pacini 01/10/2023, 9:53 AM

## 2023-01-12 NOTE — Anesthesia Postprocedure Evaluation (Signed)
Anesthesia Post Note  Patient: Emma Rogers  Procedure(s) Performed: Posterior Lumbar Interbody Fusion  - Lumar three-Lumbar four - Posterior Lateral and Interbody fusion (Back)     Patient location during evaluation: PACU Anesthesia Type: General Level of consciousness: awake and alert Pain management: pain level controlled Vital Signs Assessment: post-procedure vital signs reviewed and stable Respiratory status: spontaneous breathing, nonlabored ventilation and respiratory function stable Cardiovascular status: blood pressure returned to baseline and stable Postop Assessment: no apparent nausea or vomiting Anesthetic complications: no   No notable events documented.  Last Vitals:  Vitals:   01/10/23 0610 01/10/23 0738  BP: (!) 95/54 (!) 99/54  Pulse: 61 67  Resp:  20  Temp:  37.1 C  SpO2:  91%    Last Pain:  Vitals:   01/10/23 1035  TempSrc:   PainSc: 7                  Beatriz Quintela

## 2023-01-21 ENCOUNTER — Other Ambulatory Visit: Payer: Self-pay | Admitting: Family Medicine

## 2023-01-22 ENCOUNTER — Other Ambulatory Visit: Payer: Self-pay

## 2023-01-22 DIAGNOSIS — I951 Orthostatic hypotension: Secondary | ICD-10-CM | POA: Diagnosis not present

## 2023-01-22 MED ORDER — METHYLPREDNISOLONE 4 MG PO TBPK
ORAL_TABLET | ORAL | 0 refills | Status: DC
  Filled 2023-01-22: qty 21, 6d supply, fill #0

## 2023-01-22 MED ORDER — FUROSEMIDE 20 MG PO TABS
20.0000 mg | ORAL_TABLET | Freq: Every day | ORAL | 0 refills | Status: DC
  Filled 2023-01-22: qty 90, 90d supply, fill #0

## 2023-01-22 MED ORDER — GABAPENTIN 300 MG PO CAPS
300.0000 mg | ORAL_CAPSULE | Freq: Every day | ORAL | 0 refills | Status: DC
  Filled 2023-01-22: qty 90, 90d supply, fill #0

## 2023-01-22 MED ORDER — ATORVASTATIN CALCIUM 40 MG PO TABS
40.0000 mg | ORAL_TABLET | Freq: Every day | ORAL | 0 refills | Status: DC
  Filled 2023-01-22: qty 90, 90d supply, fill #0

## 2023-01-22 NOTE — Telephone Encounter (Signed)
Requested Prescriptions  Pending Prescriptions Disp Refills   gabapentin (NEURONTIN) 300 MG capsule 90 capsule 0    Sig: Take 1 capsule (300 mg total) by mouth at bedtime.     Neurology: Anticonvulsants - gabapentin Failed - 01/21/2023  2:29 PM      Failed - Cr in normal range and within 360 days    Creat  Date Value Ref Range Status  03/01/2019 1.14 (H) 0.50 - 1.05 mg/dL Final    Comment:    For patients >56 years of age, the reference limit for Creatinine is approximately 13% higher for people identified as African-American. .    Creatinine, Ser  Date Value Ref Range Status  01/08/2023 1.03 (H) 0.44 - 1.00 mg/dL Final         Passed - Completed PHQ-2 or PHQ-9 in the last 360 days      Passed - Valid encounter within last 12 months    Recent Outpatient Visits           4 months ago Acute left-sided low back pain with left-sided sciatica   [place] [facility] [place], Megan P, DO   5 months ago Acute left-sided low back pain with left-sided sciatica   [place] [facility], Megan P, DO   5 months ago Left hip pain   [place] [facility] Mecum, Erin E, PA-C   6 months ago Mild intermittent asthma with exacerbation   [place] [facility] Mecum, Erin E, PA-C   6 months ago GAD (generalized anxiety disorder)    [facility] [place], [place], DO       Future Appointments             In 2 months Rice, Jamesetta Orleans, MD [facility]             atorvastatin (LIPITOR) 40 MG tablet 90 tablet 0    Sig: Take 1 tablet (40 mg total) by mouth at bedtime. OFFICE VISIT NEEDED FOR ADDITIONAL REFILLS     Cardiovascular:  Antilipid - Statins Failed - 01/21/2023  2:29 PM      Failed - Lipid Panel in normal range within the last 12 months    Cholesterol, Total  Date Value Ref Range Status  12/24/2021 268 (H) 100 - 199 mg/dL Final   LDL Cholesterol (Calc)  Date Value Ref  Range Status  11/01/2018 162 (H) mg/dL (calc) Final    Comment:    Reference range: <100 . Desirable range <100 mg/dL for primary prevention;   <70 mg/dL for patients with CHD or diabetic patients  with > or = 2 CHD risk factors. Marland Kitchen LDL-C is now calculated using the Martin-Hopkins  calculation, which is a validated novel method providing  better accuracy than the Friedewald equation in the  estimation of LDL-C.  Horald Pollen et al. Lenox Ahr. 1610;960(45): 2061-2068  (http://education.[employer].com/faq/FAQ164)    LDL Chol Calc (NIH)  Date Value Ref Range Status  12/24/2021 186 (H) 0 - 99 mg/dL Final   Direct LDL  Date Value Ref Range Status  02/13/2012 178.3 mg/dL Final    Comment:    Optimal:  <100 mg/dLNear or Above Optimal:  100-129 mg/dLBorderline High:  130-159 mg/dLHigh:  160-189 mg/dLVery High:  >190 mg/dL   HDL  Date Value Ref Range Status  12/24/2021 55 >39 mg/dL Final   Triglycerides  Date Value Ref Range Status  12/24/2021 148 0 - 149 mg/dL Final  Passed - Patient is not pregnant      Passed - Valid encounter within last 12 months    Recent Outpatient Visits           4 months ago Acute left-sided low back pain with left-sided sciatica   [place] [facility], Megan P, DO   5 months ago Acute left-sided low back pain with left-sided sciatica   [place] [facility], Megan P, DO   5 months ago Left hip pain   [place] [facility] Mecum, Erin E, PA-C   6 months ago Mild intermittent asthma with exacerbation   [place] [facility] Mecum, Erin E, PA-C   6 months ago GAD (generalized anxiety disorder)   [place] [facility] [place], [place], DO       Future Appointments             In 2 months Rice, Jamesetta Orleans, MD [facility]             furosemide (LASIX) 20 MG tablet 90 tablet 0    Sig: Take 1 tablet (20 mg total) by mouth daily.  OFFICE VISIT NEEDED FOR ADDITIONAL REFILLS     Cardiovascular:  Diuretics - Loop Failed - 01/21/2023  2:29 PM      Failed - Cr in normal range and within 180 days    Creat  Date Value Ref Range Status  03/01/2019 1.14 (H) 0.50 - 1.05 mg/dL Final    Comment:    For patients >87 years of age, the reference limit for Creatinine is approximately 13% higher for people identified as African-American. .    Creatinine, Ser  Date Value Ref Range Status  01/08/2023 1.03 (H) 0.44 - 1.00 mg/dL Final         Failed - Mg Level in normal range and within 180 days    Magnesium  Date Value Ref Range Status  03/01/2019 1.9 1.5 - 2.5 mg/dL Final         Passed - K in normal range and within 180 days    Potassium  Date Value Ref Range Status  01/08/2023 3.9 3.5 - 5.1 mmol/L Final  06/15/2014 3.3 (L) 3.5 - 5.1 mmol/L Final         Passed - Ca in normal range and within 180 days    Calcium  Date Value Ref Range Status  01/08/2023 9.0 8.9 - 10.3 mg/dL Final   Calcium, Total  Date Value Ref Range Status  06/15/2014 9.2 8.5 - 10.1 mg/dL Final         Passed - Na in normal range and within 180 days    Sodium  Date Value Ref Range Status  01/08/2023 136 135 - 145 mmol/L Final  12/24/2021 138 134 - 144 mmol/L Final  06/15/2014 135 (L) 136 - 145 mmol/L Final         Passed - Cl in normal range and within 180 days    Chloride  Date Value Ref Range Status  01/08/2023 103 98 - 111 mmol/L Final  06/15/2014 98 98 - 107 mmol/L Final         Passed - Last BP in normal range    BP Readings from Last 1 Encounters:  01/10/23 (!) 99/54         Passed - Valid encounter within last 6 months    Recent Outpatient Visits  4 months ago Acute left-sided low back pain with left-sided sciatica   [place] [facility] [place], [place] P, DO   5 months ago Acute left-sided low back pain with left-sided sciatica   [place] [facility] [place], Megan P, DO   5  months ago Left hip pain   [place] [facility] Mecum, Erin E, PA-C   6 months ago Mild intermittent asthma with exacerbation   [place] [facility] Mecum, Oswaldo Conroy, PA-C   6 months ago GAD (generalized anxiety disorder)   [place] [facility] Dorcas [place], DO       Future Appointments             In 2 months Rice, Jamesetta Orleans, MD [facility]

## 2023-01-28 ENCOUNTER — Ambulatory Visit (INDEPENDENT_AMBULATORY_CARE_PROVIDER_SITE_OTHER): Payer: Commercial Managed Care - PPO | Admitting: Family Medicine

## 2023-01-28 ENCOUNTER — Other Ambulatory Visit (HOSPITAL_COMMUNITY): Payer: Self-pay

## 2023-01-28 ENCOUNTER — Other Ambulatory Visit: Payer: Self-pay

## 2023-01-28 ENCOUNTER — Other Ambulatory Visit: Payer: Self-pay | Admitting: Internal Medicine

## 2023-01-28 ENCOUNTER — Encounter: Payer: Self-pay | Admitting: Family Medicine

## 2023-01-28 VITALS — BP 128/80 | HR 78 | Temp 98.8°F | Wt 206.5 lb

## 2023-01-28 DIAGNOSIS — L4050 Arthropathic psoriasis, unspecified: Secondary | ICD-10-CM

## 2023-01-28 DIAGNOSIS — R42 Dizziness and giddiness: Secondary | ICD-10-CM

## 2023-01-28 DIAGNOSIS — Z79899 Other long term (current) drug therapy: Secondary | ICD-10-CM

## 2023-01-28 MED ORDER — TREMFYA 100 MG/ML ~~LOC~~ SOAJ
SUBCUTANEOUS | 0 refills | Status: DC
Start: 2023-01-28 — End: 2023-04-28
  Filled 2023-01-28: qty 1, fill #0
  Filled 2023-01-30: qty 1, 56d supply, fill #0

## 2023-01-28 MED ORDER — FUROSEMIDE 20 MG PO TABS: 20.0000 mg | ORAL_TABLET | Freq: Every day | ORAL | 0 refills | Status: DC | PRN

## 2023-01-28 NOTE — Telephone Encounter (Signed)
Last Fill: 07/30/2022  Labs: 01/08/2023 CBC and BMP Glucose 120 Creatinine Ser 1.03   TB Gold: 02/07/2022 Negative   Next Visit: 03/30/2023  Last Visit: 09/23/2022  [id number] arthritis   Current Dose per office note 09/23/2022: Tremfya 100 mg q8wks   Okay to refill Tremfyia?

## 2023-01-28 NOTE — Progress Notes (Signed)
BP 128/80   Pulse 78   Temp 98.8 F (37.1 C) (Oral)   Wt 206 lb 8 oz (93.7 kg)   SpO2 97%   BMI 34.36 kg/m    Subjective:    Patient ID: Emma Rogers, female    DOB: [birth year], 57 y.o.   MRN: [id number]  HPI: Emma Rogers is a 57 y.o. female  Chief Complaint  Patient presents with   Dizziness    Pt states she has been having daily dizziness since 01/09/23. States she also feels like both of her ears are full, states she feels like they have cotton on her. States she is afraid she is going to fall because of the dizziness.    DIZZINESS Duration: 2-3 weeks, since she had surgery Description of symptoms: light headed Duration of episode: minute Dizziness frequency: daily, sometimes a couple of times a day Provoking factors: getting up or standing too long Triggered by rolling over in bed: no Triggered by bending over: unknown Aggravated by head movement: yes Aggravated by exertion, coughing, loud noises: no Recent head injury: no Recent or current viral symptoms: no History of vasovagal episodes: no Nausea: yes Vomiting: no Tinnitus: yes- before this started Hearing loss: no Aural fullness: yes Headache: no Photophobia/phonophobia: no Unsteady gait: yes Postural instability: yes Diplopia, dysarthria, dysphagia or weakness: no Related to exertion: yes Pallor: no Diaphoresis: no Dyspnea: no Chest pain: no  Not currently on any pain medicine.   Relevant past medical, surgical, family and social history reviewed and updated as indicated. Interim medical history since our last visit reviewed. Allergies and medications reviewed and updated.  Review of Systems  Constitutional: Negative.   Respiratory: Negative.    Cardiovascular: Negative.   Gastrointestinal: Negative.   Musculoskeletal: Negative.   Neurological:  Positive for dizziness and light-headedness. Negative for tremors, seizures, syncope, facial asymmetry, speech difficulty, weakness, numbness and  headaches.  Psychiatric/Behavioral: Negative.      Per HPI unless specifically indicated above     Objective:    BP 128/80   Pulse 78   Temp 98.8 F (37.1 C) (Oral)   Wt 206 lb 8 oz (93.7 kg)   SpO2 97%   BMI 34.36 kg/m   Wt Readings from Last 3 Encounters:  01/28/23 206 lb 8 oz (93.7 kg)  01/09/23 200 lb (90.7 kg)  01/08/23 213 lb 1.6 oz (96.7 kg)    Physical Exam Vitals and nursing note reviewed.  Constitutional:      General: She is not in acute distress.    Appearance: Normal appearance. She is obese. She is not ill-appearing, toxic-appearing or diaphoretic.  HENT:     Head: Normocephalic and atraumatic.     Right Ear: External ear normal.     Left Ear: External ear normal.     Nose: Nose normal.     Mouth/Throat:     Mouth: Mucous membranes are moist.     Pharynx: Oropharynx is clear.  Eyes:     General: No scleral icterus.       Right eye: No discharge.        Left eye: No discharge.     Extraocular Movements: Extraocular movements intact.     Right eye: No nystagmus.     Left eye: No nystagmus.     Conjunctiva/sclera: Conjunctivae normal.     Pupils: Pupils are equal, round, and reactive to light.  Cardiovascular:     Rate and Rhythm: Normal rate and regular rhythm.  Pulses: Normal pulses.     Heart sounds: Normal heart sounds. No murmur heard.    No friction rub. No gallop.  Pulmonary:     Effort: Pulmonary effort is normal. No respiratory distress.     Breath sounds: Normal breath sounds. No stridor. No wheezing, rhonchi or rales.  Chest:     Chest wall: No tenderness.  Musculoskeletal:        General: Normal range of motion.     Cervical back: Normal range of motion and neck supple.  Skin:    General: Skin is warm and dry.     Capillary Refill: Capillary refill takes less than 2 seconds.     Coloration: Skin is not jaundiced or pale.     Findings: No bruising, erythema, lesion or rash.  Neurological:     General: No focal deficit present.      Mental Status: She is alert and oriented to person, place, and time. Mental status is at baseline.  Psychiatric:        Mood and Affect: Mood normal.        Behavior: Behavior normal.        Thought Content: Thought content normal.        Judgment: Judgment normal.     Results for orders placed or performed during the hospital encounter of 01/09/23  ABO/Rh  Result Value Ref Range   ABO/RH(D)      A POS Performed at [facility], [address], [place], [place] [address]       Assessment & Plan:   Problem List Items Addressed This Visit   None Visit Diagnoses     Dizziness    -  Primary   Likely orthostatic. Will change lasix to PRN and check labs. Increase electrolyte intake. Call with any concerns.   Relevant Orders   CBC with Differential/Platelet   Comprehensive metabolic panel   Magnesium   Phosphorus        Follow up plan: Return in about 2 weeks (around 02/11/2023).

## 2023-01-29 LAB — COMPREHENSIVE METABOLIC PANEL
ALT: 22 IU/L (ref 0–32)
AST: 20 IU/L (ref 0–40)
Albumin/Globulin Ratio: 1.5 (ref 1.2–2.2)
Albumin: 4.4 g/dL (ref 3.8–4.9)
Alkaline Phosphatase: 116 IU/L (ref 44–121)
BUN/Creatinine Ratio: 24 — ABNORMAL HIGH (ref 9–23)
BUN: 28 mg/dL — ABNORMAL HIGH (ref 6–24)
Bilirubin Total: 0.3 mg/dL (ref 0.0–1.2)
CO2: 21 mmol/L (ref 20–29)
Calcium: 9.7 mg/dL (ref 8.7–10.2)
Chloride: 98 mmol/L (ref 96–106)
Creatinine, Ser: 1.16 mg/dL — ABNORMAL HIGH (ref 0.57–1.00)
Globulin, Total: 3 g/dL (ref 1.5–4.5)
Glucose: 129 mg/dL — ABNORMAL HIGH (ref 70–99)
Potassium: 4.5 mmol/L (ref 3.5–5.2)
Sodium: 137 mmol/L (ref 134–144)
Total Protein: 7.4 g/dL (ref 6.0–8.5)
eGFR: 56 mL/min/{1.73_m2} — ABNORMAL LOW (ref >59)

## 2023-01-29 LAB — CBC WITH DIFFERENTIAL/PLATELET
Basophils Absolute: 0.1 10*3/uL (ref 0.0–0.2)
Basos: 1 %
EOS (ABSOLUTE): 0.1 10*3/uL (ref 0.0–0.4)
Eos: 1 %
Hematocrit: 41 % (ref 34.0–46.6)
Hemoglobin: 13.6 g/dL (ref 11.1–15.9)
Immature Grans (Abs): 0 10*3/uL (ref 0.0–0.1)
Immature Granulocytes: 0 %
Lymphocytes Absolute: 2.4 10*3/uL (ref 0.7–3.1)
Lymphs: 16 %
MCH: 30.9 pg (ref 26.6–33.0)
MCHC: 33.2 g/dL (ref 31.5–35.7)
MCV: 93 fL (ref 79–97)
Monocytes Absolute: 0.7 10*3/uL (ref 0.1–0.9)
Monocytes: 4 %
Neutrophils Absolute: 11.4 10*3/uL — ABNORMAL HIGH (ref 1.4–7.0)
Neutrophils: 78 %
Platelets: 510 10*3/uL — ABNORMAL HIGH (ref 150–450)
RBC: 4.4 x10E6/uL (ref 3.77–5.28)
RDW: 12.7 % (ref 11.7–15.4)
WBC: 14.7 10*3/uL — ABNORMAL HIGH (ref 3.4–10.8)

## 2023-01-29 LAB — PHOSPHORUS: Phosphorus: 4.2 mg/dL (ref 3.0–4.3)

## 2023-01-29 LAB — MAGNESIUM: Magnesium: 2.2 mg/dL (ref 1.6–2.3)

## 2023-01-30 ENCOUNTER — Other Ambulatory Visit: Payer: Self-pay

## 2023-01-30 ENCOUNTER — Other Ambulatory Visit (HOSPITAL_COMMUNITY): Payer: Self-pay

## 2023-02-02 ENCOUNTER — Other Ambulatory Visit (HOSPITAL_COMMUNITY): Payer: Self-pay

## 2023-02-12 ENCOUNTER — Ambulatory Visit: Payer: Commercial Managed Care - PPO | Admitting: Family Medicine

## 2023-02-12 DIAGNOSIS — M48062 Spinal stenosis, lumbar region with neurogenic claudication: Secondary | ICD-10-CM | POA: Diagnosis not present

## 2023-02-13 ENCOUNTER — Ambulatory Visit: Payer: Commercial Managed Care - PPO | Admitting: Family Medicine

## 2023-02-20 ENCOUNTER — Other Ambulatory Visit: Payer: Self-pay | Admitting: Family Medicine

## 2023-02-20 ENCOUNTER — Other Ambulatory Visit: Payer: Self-pay

## 2023-02-20 MED FILL — Trazodone HCl Tab 100 MG: ORAL | 90 days supply | Qty: 180 | Fill #0 | Status: AC

## 2023-02-20 MED FILL — Sertraline HCl Tab 50 MG: ORAL | 90 days supply | Qty: 90 | Fill #0 | Status: AC

## 2023-02-20 NOTE — Telephone Encounter (Signed)
Requested Prescriptions  Pending Prescriptions Disp Refills   sertraline (ZOLOFT) 50 MG tablet 90 tablet 0    Sig: Take 1 tablet (50 mg total) by mouth daily.     Psychiatry:  Antidepressants - SSRI - sertraline Passed - 02/20/2023  9:11 AM      Passed - AST in normal range and within 360 days    AST  Date Value Ref Range Status  01/28/2023 20 0 - 40 IU/L Final         Passed - ALT in normal range and within 360 days    ALT  Date Value Ref Range Status  01/28/2023 22 0 - 32 IU/L Final         Passed - Completed PHQ-2 or PHQ-9 in the last 360 days      Passed - Valid encounter within last 6 months    Recent Outpatient Visits           3 weeks ago Dizziness   [place] [facility] [place], Megan P, DO   5 months ago Acute left-sided low back pain with left-sided sciatica   [place] [facility] [place], Megan P, DO   6 months ago Acute left-sided low back pain with left-sided sciatica   [place] [facility] [place], Megan P, DO   6 months ago Left hip pain   [place] [facility] Mecum, Erin E, PA-C   7 months ago Mild intermittent asthma with exacerbation   [place] [facility] Mecum, Oswaldo Conroy, PA-C       Future Appointments             In 1 month Rice, Jamesetta Orleans, MD [facility]             traZODone (DESYREL) 100 MG tablet 180 tablet 0    Sig: Take 2 tablets (200 mg total) by mouth at bedtime.     Psychiatry: Antidepressants - Serotonin Modulator Passed - 02/20/2023  9:11 AM      Passed - Completed PHQ-2 or PHQ-9 in the last 360 days      Passed - Valid encounter within last 6 months    Recent Outpatient Visits           3 weeks ago Dizziness   [place] [facility] [place], Megan P, DO   5 months ago Acute left-sided low back pain with left-sided sciatica   [place] [facility], Megan P, DO   6 months ago Acute left-sided low  back pain with left-sided sciatica   [place] [facility] [place], Megan P, DO   6 months ago Left hip pain   [place] [facility] Mecum, Erin E, PA-C   7 months ago Mild intermittent asthma with exacerbation   [place] [facility] Mecum, Oswaldo Conroy, PA-C       Future Appointments             In 1 month Rice, Jamesetta Orleans, MD [facility]

## 2023-02-23 ENCOUNTER — Other Ambulatory Visit: Payer: Self-pay

## 2023-02-26 ENCOUNTER — Ambulatory Visit (INDEPENDENT_AMBULATORY_CARE_PROVIDER_SITE_OTHER): Payer: Commercial Managed Care - PPO

## 2023-02-26 DIAGNOSIS — Z950 Presence of cardiac pacemaker: Secondary | ICD-10-CM | POA: Diagnosis not present

## 2023-02-27 LAB — CUP PACEART REMOTE DEVICE CHECK
Battery Impedance: 2031 Ohm
Battery Remaining Longevity: 36 mo
Battery Voltage: 2.74 V
Brady Statistic AP VP Percent: 1 %
Brady Statistic AP VS Percent: 21 %
Brady Statistic AS VP Percent: 0 %
Brady Statistic AS VS Percent: 78 %
Date Time Interrogation Session: 20240606144704
Implantable Lead Connection Status: 753985
Implantable Lead Connection Status: 753985
Implantable Lead Implant Date: 19960806
Implantable Lead Implant Date: 19960806
Implantable Lead Location: 753859
Implantable Lead Location: 753860
Implantable Lead Model: 4524
Implantable Lead Model: 5034
Implantable Pulse Generator Implant Date: 20110328
Lead Channel Impedance Value: 1252 Ohm
Lead Channel Impedance Value: 289 Ohm
Lead Channel Pacing Threshold Amplitude: 1.25 V
Lead Channel Pacing Threshold Amplitude: 1.5 V
Lead Channel Pacing Threshold Pulse Width: 0.4 ms
Lead Channel Pacing Threshold Pulse Width: 0.4 ms
Lead Channel Setting Pacing Amplitude: 2.5 V
Lead Channel Setting Pacing Amplitude: 3 V
Lead Channel Setting Pacing Pulse Width: 0.4 ms
Lead Channel Setting Sensing Sensitivity: 2 mV
Zone Setting Status: 755011
Zone Setting Status: 755011

## 2023-03-17 NOTE — Progress Notes (Signed)
Remote pacemaker transmission.   

## 2023-03-18 ENCOUNTER — Other Ambulatory Visit (HOSPITAL_COMMUNITY): Payer: Self-pay

## 2023-03-23 ENCOUNTER — Other Ambulatory Visit (HOSPITAL_COMMUNITY): Payer: Self-pay

## 2023-03-27 ENCOUNTER — Ambulatory Visit: Payer: Commercial Managed Care - PPO | Admitting: Internal Medicine

## 2023-03-30 ENCOUNTER — Encounter: Payer: Self-pay | Admitting: Internal Medicine

## 2023-03-30 ENCOUNTER — Ambulatory Visit: Payer: Commercial Managed Care - PPO | Attending: Internal Medicine | Admitting: Internal Medicine

## 2023-03-30 VITALS — BP 115/82 | HR 60 | Resp 14 | Ht 65.0 in | Wt 204.0 lb

## 2023-03-30 DIAGNOSIS — M47819 Spondylosis without myelopathy or radiculopathy, site unspecified: Secondary | ICD-10-CM

## 2023-03-30 DIAGNOSIS — N183 Chronic kidney disease, stage 3 unspecified: Secondary | ICD-10-CM | POA: Diagnosis not present

## 2023-03-30 DIAGNOSIS — L4050 Arthropathic psoriasis, unspecified: Secondary | ICD-10-CM | POA: Diagnosis not present

## 2023-03-30 LAB — CBC WITH DIFFERENTIAL/PLATELET
Eosinophils Absolute: 207 cells/uL (ref 15–500)
Eosinophils Relative: 3.4 %
Hemoglobin: 13.8 g/dL (ref 11.7–15.5)
MCV: 94.6 fL (ref 80.0–100.0)
MPV: 10.6 fL (ref 7.5–12.5)
Monocytes Relative: 5.7 %
Neutrophils Relative %: 64.8 %
Platelets: 299 10*3/uL (ref 140–400)
RBC: 4.45 10*6/uL (ref 3.80–5.10)
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
## 2023-03-31 ENCOUNTER — Other Ambulatory Visit: Payer: Self-pay

## 2023-03-31 DIAGNOSIS — M48062 Spinal stenosis, lumbar region with neurogenic claudication: Secondary | ICD-10-CM | POA: Diagnosis not present

## 2023-03-31 LAB — COMPLETE METABOLIC PANEL WITH GFR
Alkaline phosphatase (APISO): 65 U/L (ref 37–153)
BUN: 22 mg/dL (ref 7–25)
CO2: 28 mmol/L (ref 20–32)
Calcium: 9.6 mg/dL (ref 8.6–10.4)
Chloride: 105 mmol/L (ref 98–110)
Globulin: 2.7 g/dL (calc) (ref 1.9–3.7)
Glucose, Bld: 101 mg/dL — ABNORMAL HIGH (ref 65–99)
Potassium: 4.4 mmol/L (ref 3.5–5.3)

## 2023-03-31 LAB — CBC WITH DIFFERENTIAL/PLATELET
Absolute Monocytes: 348 cells/uL (ref 200–950)
HCT: 42.1 % (ref 35.0–45.0)
Lymphs Abs: 1543 cells/uL (ref 850–3900)
MCHC: 32.8 g/dL (ref 32.0–36.0)

## 2023-03-31 LAB — C-REACTIVE PROTEIN: CRP: <3 mg/L (ref <8.0)

## 2023-03-31 MED ORDER — METHYLPREDNISOLONE 4 MG PO TBPK
ORAL_TABLET | ORAL | 0 refills | Status: DC
  Filled 2023-03-31: qty 21, 6d supply, fill #0

## 2023-03-31 NOTE — Progress Notes (Signed)
Blood count and metabolic panel are normal. No problems for continuing tremfya. Her CRP is also normal so does not show evidence of systemic inflammation. I suspect joint pain may be worse due to having to stop some of her medications for the surgery and may improve if she can restart these soon.

## 2023-04-01 LAB — COMPLETE METABOLIC PANEL WITH GFR
AG Ratio: 1.6 (calc) (ref 1.0–2.5)
ALT: 12 U/L (ref 6–29)
AST: 14 U/L (ref 10–35)
Albumin: 4.4 g/dL (ref 3.6–5.1)
Creat: 0.95 mg/dL (ref 0.50–1.03)
Sodium: 138 mmol/L (ref 135–146)
Total Bilirubin: 0.4 mg/dL (ref 0.2–1.2)
Total Protein: 7.1 g/dL (ref 6.1–8.1)
eGFR: 71 mL/min/{1.73_m2} (ref >=60)

## 2023-04-01 LAB — QUANTIFERON-TB GOLD PLUS
Mitogen-NIL: >10 IU/mL
NIL: 0.02 IU/mL
QuantiFERON-TB Gold Plus: NEGATIVE
TB1-NIL: 0 IU/mL
TB2-NIL: 0 IU/mL

## 2023-04-01 LAB — CBC WITH DIFFERENTIAL/PLATELET
Basophils Absolute: 49 cells/uL (ref 0–200)
Basophils Relative: 0.8 %
MCH: 31 pg (ref 27.0–33.0)
Neutro Abs: 3953 cells/uL (ref 1500–7800)
RDW: 12.9 % (ref 11.0–15.0)
Total Lymphocyte: 25.3 %
WBC: 6.1 10*3/uL (ref 3.8–10.8)

## 2023-04-06 ENCOUNTER — Ambulatory Visit: Payer: Self-pay | Admitting: *Deleted

## 2023-04-06 NOTE — Telephone Encounter (Signed)
Message from [place] F sent at 04/06/2023 11:00 AM EDT  Summary: Potential broken toe/swelling   Pt believes she broke her pinky toe and is having pains in her legs. Pt says it is painful to move the toe and there is swelling as well.          Call History  Contact Date/Time Type Contact Phone/Fax User  04/06/2023 10:58 AM EDT Phone ([address] [place], [place] (Self) [phone] Rexene Edison) Colon Flattery M

## 2023-04-06 NOTE — Telephone Encounter (Signed)
Reason for Disposition  Toe swelling, bruise or pain  Answer Assessment - Initial Assessment Questions 1. MECHANISM: "How did the injury happen?"      My right pinky toe is possibly broken.    It happened a week ago. 2. ONSET: "When did the injury happen?" (Minutes or hours ago)      I stumped it.   I tripped over my coffee table.   It really hurt. 3. LOCATION: "What part of the toe is injured?" "Is the nail damaged?"      It's swollen and bruised.    I have a little swelling on the outer side of my foot.   I had back surgery in April and I've been on prednisone.   Today is my last day on it.   I'm wearing orthopedic shoes since having back surgery which is really helpful with this toe injury. 4. APPEARANCE of TOE INJURY: "What does the injury look like?"      It's swollen and bruised.   5. SEVERITY: "Can you use the foot normally?" "Can you walk?"      I can walk but I walking on side of my foot on the inside.   6. SIZE: For cuts, bruises, or swelling, ask: "How large is it?" (e.g., inches or centimeters;  entire toe)      Bruised and swollen.   No broken skin 7. PAIN: "Is there pain?" If Yes, ask: "How bad is the pain?"   (e.g., Scale 1-10; or mild, moderate, severe)     It's uncomfortable.  8. TETANUS: For any breaks in the skin, ask: "When was the last tetanus booster?"     No breaks in the skin 9. DIABETES: "Do you have a history of diabetes or poor circulation in the feet?"     No diabetes 10. OTHER SYMPTOMS: "Do you have any other symptoms?"        No 11. PREGNANCY: "Is there any chance you are pregnant?" "When was your last menstrual period?"       N/A due to age  Protocols used: Toe Injury-A-AH  Chief Complaint: Right pinky toe injury Symptoms: Bruised and swollen pinky toe after stumping it on her coffee table. Frequency: Happened a week ago.    She is on prednisone from having back surgery in April.   Also using orthopedic shoe on right foot Pertinent Negatives: Patient  denies redness, or having diabetes Disposition: [] ED /[] Urgent Care (no appt availability in office) / [] Appointment(In office/virtual)/ []  [facility]/ [x] Home Care/ [] Refused Recommended Disposition /[] [facility]/ []  Follow-up with PCP Additional Notes: Went over home care advice.   Pt. Agreeable to doing care advice and giving the toes a few more days to see if it will continue to get better before making an appt.  I went over the s/s to watch for and give [place] a call back if they occur;  increased swelling, pain, redness.

## 2023-04-06 NOTE — Telephone Encounter (Signed)
Attempted to return her call.   Left a voicemail to call back to discuss symptoms with a nurse. 

## 2023-04-09 ENCOUNTER — Telehealth: Payer: Self-pay | Admitting: Family Medicine

## 2023-04-09 NOTE — Telephone Encounter (Unsigned)
Copied from CRM [id number]. Topic: General - Other >> Apr 09, 2023 11:22 AM Macon Large wrote: Reason for CRM: Jodie with [facility] stated they only received about 50 pages of the 109 pages so she would like to ask that the records be re-faxed to [phone]. Cb# [phone] Ext. [phone]

## 2023-04-13 NOTE — Telephone Encounter (Signed)
Medical records has been resent to [facility] at [phone] Called to inform Lennox Laity that records was unable to leave message.

## 2023-04-15 ENCOUNTER — Telehealth: Payer: Self-pay | Admitting: Family Medicine

## 2023-04-15 NOTE — Telephone Encounter (Signed)
Jodie with [employer] [facility] is calling in because they received pt's medical records via fax but they didn't receive all the pages. Jodie says there are supposed to be 109 pgs but they only received 50+. Scherry Ran is requesting the medical records be resent to fax:  [phone].

## 2023-04-16 NOTE — Telephone Encounter (Signed)
Medical records have been resent to requester.

## 2023-04-22 ENCOUNTER — Other Ambulatory Visit: Payer: Self-pay | Admitting: Neurosurgery

## 2023-04-22 DIAGNOSIS — M48062 Spinal stenosis, lumbar region with neurogenic claudication: Secondary | ICD-10-CM

## 2023-04-24 ENCOUNTER — Telehealth: Payer: Self-pay

## 2023-04-24 NOTE — Telephone Encounter (Signed)
Per [facility], pt was due for Tremfya refill on 03/23/23 but has yet to be reached by the call center. Will reach out to pt via MyChart for update regarding continued treatment.

## 2023-04-28 ENCOUNTER — Other Ambulatory Visit (HOSPITAL_COMMUNITY): Payer: Self-pay

## 2023-04-28 ENCOUNTER — Ambulatory Visit
Admission: RE | Admit: 2023-04-28 | Discharge: 2023-04-28 | Disposition: A | Discharge status: Home / Self Care | Payer: Commercial Managed Care - PPO | Source: Ambulatory Visit | Attending: Neurosurgery | Admitting: Neurosurgery

## 2023-04-28 ENCOUNTER — Other Ambulatory Visit: Payer: Self-pay | Admitting: Internal Medicine

## 2023-04-28 ENCOUNTER — Other Ambulatory Visit: Payer: Self-pay

## 2023-04-28 DIAGNOSIS — Z981 Arthrodesis status: Secondary | ICD-10-CM | POA: Diagnosis not present

## 2023-04-28 DIAGNOSIS — M48062 Spinal stenosis, lumbar region with neurogenic claudication: Secondary | ICD-10-CM

## 2023-04-28 DIAGNOSIS — M5126 Other intervertebral disc displacement, lumbar region: Secondary | ICD-10-CM | POA: Diagnosis not present

## 2023-04-28 DIAGNOSIS — Z79899 Other long term (current) drug therapy: Secondary | ICD-10-CM

## 2023-04-28 DIAGNOSIS — M4316 Spondylolisthesis, lumbar region: Secondary | ICD-10-CM | POA: Diagnosis not present

## 2023-04-28 DIAGNOSIS — L4050 Arthropathic psoriasis, unspecified: Secondary | ICD-10-CM

## 2023-04-28 DIAGNOSIS — M47816 Spondylosis without myelopathy or radiculopathy, lumbar region: Secondary | ICD-10-CM | POA: Diagnosis not present

## 2023-04-28 MED ORDER — TREMFYA 100 MG/ML ~~LOC~~ SOAJ
SUBCUTANEOUS | 2 refills | Status: AC
Start: 2023-04-28 — End: ?
  Filled 2023-04-28: qty 1, 56d supply, fill #0
  Filled 2023-07-06: qty 1, 56d supply, fill #1
  Filled 2023-09-15: qty 1, 56d supply, fill #2

## 2023-04-28 NOTE — Telephone Encounter (Signed)
Patient called [facility] and has scheduled refill of Tremfya  Chesley Mires, PharmD, MPH, BCPS, CPP Clinical Pharmacist (Rheumatology and Pulmonology)

## 2023-04-28 NOTE — Telephone Encounter (Signed)
Last Fill: 01/28/2023  Labs: 03/30/2023 Blood count and metabolic panel are normal. No problems for continuing tremfya. Her CRP is also normal so does not show evidence of systemic inflammation. I suspect joint pain may be worse due to having to stop some of her medications for the surgery and may improve if she can restart these soon.  TB Gold: 03/30/2023 Negative   Next Visit: 09/30/2023  Last Visit: 03/30/2023  UE:[id number] arthritis   Current Dose per office note 03/30/2023: Tremfya 100 mg [place] q8wks.   Okay to refill Tremfyia?

## 2023-05-07 DIAGNOSIS — M48062 Spinal stenosis, lumbar region with neurogenic claudication: Secondary | ICD-10-CM | POA: Diagnosis not present

## 2023-05-10 ENCOUNTER — Other Ambulatory Visit: Payer: Self-pay | Admitting: Family Medicine

## 2023-05-12 ENCOUNTER — Other Ambulatory Visit: Payer: Self-pay

## 2023-05-12 MED ORDER — TRAZODONE HCL 100 MG PO TABS
200.0000 mg | ORAL_TABLET | Freq: Every day | ORAL | 0 refills | Status: DC
  Filled 2023-05-12: qty 180, 90d supply, fill #0

## 2023-05-12 MED ORDER — GABAPENTIN 300 MG PO CAPS
300.0000 mg | ORAL_CAPSULE | Freq: Every day | ORAL | 0 refills | Status: DC
  Filled 2023-05-12: qty 90, 90d supply, fill #0

## 2023-05-12 MED ORDER — ATORVASTATIN CALCIUM 40 MG PO TABS
40.0000 mg | ORAL_TABLET | Freq: Every day | ORAL | 0 refills | Status: DC
  Filled 2023-05-12: qty 90, 90d supply, fill #0

## 2023-05-12 MED ORDER — SERTRALINE HCL 50 MG PO TABS
50.0000 mg | ORAL_TABLET | Freq: Every day | ORAL | 0 refills | Status: DC
  Filled 2023-05-12: qty 90, 90d supply, fill #0

## 2023-05-12 MED ORDER — BUPROPION HCL ER (XL) 150 MG PO TB24
150.0000 mg | ORAL_TABLET | Freq: Every day | ORAL | 0 refills | Status: DC
  Filled 2023-05-12: qty 90, 90d supply, fill #0

## 2023-05-12 MED ORDER — BUPROPION HCL ER (XL) 300 MG PO TB24
300.0000 mg | ORAL_TABLET | Freq: Every day | ORAL | 0 refills | Status: DC
  Filled 2023-05-12: qty 90, 90d supply, fill #0

## 2023-05-12 NOTE — Telephone Encounter (Signed)
Requested medication (s) are due for refill today - yes  Requested medication (s) are on the active medication list -yes  Future visit scheduled -no  Last refill: 01/22/23  Notes to clinic: fails lab protocol, last RF has notes- sent for review  Requested Prescriptions  Pending Prescriptions Disp Refills   atorvastatin (LIPITOR) 40 MG tablet 90 tablet 0    Sig: Take 1 tablet (40 mg total) by mouth at bedtime. OFFICE VISIT NEEDED FOR ADDITIONAL REFILLS     Cardiovascular:  Antilipid - Statins Failed - 05/10/2023  9:59 PM      Failed - Lipid Panel in normal range within the last 12 months    Cholesterol, Total  Date Value Ref Range Status  12/24/2021 268 (H) 100 - 199 mg/dL Final   LDL Cholesterol (Calc)  Date Value Ref Range Status  11/01/2018 162 (H) mg/dL (calc) Final    Comment:    Reference range: <100 . Desirable range <100 mg/dL for primary prevention;   <70 mg/dL for patients with CHD or diabetic patients  with > or = 2 CHD risk factors. Marland Kitchen LDL-C is now calculated using the Martin-Hopkins  calculation, which is a validated novel method providing  better accuracy than the Friedewald equation in the  estimation of LDL-C.  Horald Pollen et al. Lenox Ahr. 1610;960(45): 2061-2068  (http://education.[facility].com/faq/FAQ164)    LDL Chol Calc (NIH)  Date Value Ref Range Status  12/24/2021 186 (H) 0 - 99 mg/dL Final   Direct LDL  Date Value Ref Range Status  02/13/2012 178.3 mg/dL Final    Comment:    Optimal:  <100 mg/dLNear or Above Optimal:  100-129 mg/dLBorderline High:  130-159 mg/dLHigh:  160-189 mg/dLVery High:  >190 mg/dL   HDL  Date Value Ref Range Status  12/24/2021 55 >39 mg/dL Final   Triglycerides  Date Value Ref Range Status  12/24/2021 148 0 - 149 mg/dL Final         Passed - Patient is not pregnant      Passed - Valid encounter within last 12 months    Recent Outpatient Visits           3 months ago Dizziness   [place] [facility] [place], Megan P, DO   8 months ago Acute left-sided low back pain with left-sided sciatica   [place] [facility] [place], Megan P, DO   9 months ago Acute left-sided low back pain with left-sided sciatica   [place] [facility], Megan P, DO   9 months ago Left hip pain   [place] [facility] Mecum, Erin E, PA-C   9 months ago Mild intermittent asthma with exacerbation   [place] [facility] Mecum, Oswaldo Conroy, PA-C       Future Appointments             In 4 months Rice, Jamesetta Orleans, MD [facility] Rheumatology            Signed Prescriptions Disp Refills   buPROPion (WELLBUTRIN XL) 300 MG 24 hr tablet 90 tablet 0    Sig: Take 1 tablet (300 mg total) by mouth daily.     Psychiatry: Antidepressants - bupropion Passed - 05/10/2023  9:59 PM      Passed - Cr in normal range and within 360 days    Creat  Date Value Ref Range Status  03/30/2023 0.95 0.50 - 1.03 mg/dL Final  Passed - AST in normal range and within 360 days    AST  Date Value Ref Range Status  03/30/2023 14 10 - 35 U/L Final         Passed - ALT in normal range and within 360 days    ALT  Date Value Ref Range Status  03/30/2023 12 6 - 29 U/L Final         Passed - Completed PHQ-2 or PHQ-9 in the last 360 days      Passed - Last BP in normal range    BP Readings from Last 1 Encounters:  03/30/23 115/82         Passed - Valid encounter within last 6 months    Recent Outpatient Visits           3 months ago Dizziness   [place] [facility] [place], [place] P, DO   8 months ago Acute left-sided low back pain with left-sided sciatica   [place] [facility], Megan P, DO   9 months ago Acute left-sided low back pain with left-sided sciatica   [place] [facility], Megan P, DO   9 months ago Left hip pain   [place] [facility] Mecum, Erin E,  PA-C   9 months ago Mild intermittent asthma with exacerbation   [place] [facility] Mecum, Oswaldo Conroy, PA-C       Future Appointments             In 4 months Rice, Jamesetta Orleans, MD [facility] Rheumatology             buPROPion (WELLBUTRIN XL) 150 MG 24 hr tablet 90 tablet 0    Sig: Take 1 tablet (150 mg total) by mouth daily.     Psychiatry: Antidepressants - bupropion Passed - 05/10/2023  9:59 PM      Passed - Cr in normal range and within 360 days    Creat  Date Value Ref Range Status  03/30/2023 0.95 0.50 - 1.03 mg/dL Final         Passed - AST in normal range and within 360 days    AST  Date Value Ref Range Status  03/30/2023 14 10 - 35 U/L Final         Passed - ALT in normal range and within 360 days    ALT  Date Value Ref Range Status  03/30/2023 12 6 - 29 U/L Final         Passed - Completed PHQ-2 or PHQ-9 in the last 360 days      Passed - Last BP in normal range    BP Readings from Last 1 Encounters:  03/30/23 115/82         Passed - Valid encounter within last 6 months    Recent Outpatient Visits           3 months ago Dizziness   [place] [facility] [place], [place] P, DO   8 months ago Acute left-sided low back pain with left-sided sciatica   [place] [facility], Megan P, DO   9 months ago Acute left-sided low back pain with left-sided sciatica   [place] [facility] [place], Megan P, DO   9 months ago Left hip pain   [place] [facility] Mecum, Erin E, PA-C   9 months ago Mild intermittent asthma with exacerbation    [facility] Mecum, Oswaldo Conroy, PA-C       Future Appointments             In 4 months Rice, Jamesetta Orleans, MD [facility] Rheumat[facility]             gabapentin (NEURONTIN) 300 MG capsule 90 capsule 0    Sig: Take 1 capsule (300 mg total) by mouth at bedtime.     Neurology: Anticonvulsants - gabapentin Passed -  05/10/2023  9:59 PM      Passed - Cr in normal range and within 360 days    Creat  Date Value Ref Range Status  03/30/2023 0.95 0.50 - 1.03 mg/dL Final         Passed - Completed PHQ-2 or PHQ-9 in the last 360 days      Passed - Valid encounter within last 12 months    Recent Outpatient Visits           3 months ago Dizziness   [place] [facility] [place], [place] P, DO   8 months ago Acute left-sided low back pain with left-sided sciatica   [place] [facility], Megan P, DO   9 months ago Acute left-sided low back pain with left-sided sciatica   [place] [facility], Megan P, DO   9 months ago Left hip pain   [place] [facility] Mecum, Erin E, PA-C   9 months ago Mild intermittent asthma with exacerbation   [place] [facility] Mecum, Oswaldo Conroy, PA-C       Future Appointments             In 4 months Rice, Jamesetta Orleans, MD [place] [facility]             sertraline (ZOLOFT) 50 MG tablet 90 tablet 0    Sig: Take 1 tablet (50 mg total) by mouth daily.     Psychiatry:  Antidepressants - SSRI - sertraline Passed - 05/10/2023  9:59 PM      Passed - AST in normal range and within 360 days    AST  Date Value Ref Range Status  03/30/2023 14 10 - 35 U/L Final         Passed - ALT in normal range and within 360 days    ALT  Date Value Ref Range Status  03/30/2023 12 6 - 29 U/L Final         Passed - Completed PHQ-2 or PHQ-9 in the last 360 days      Passed - Valid encounter within last 6 months    Recent Outpatient Visits           3 months ago Dizziness   [place] [facility] [place], Megan P, DO   8 months ago Acute left-sided low back pain with left-sided sciatica   [place] [facility], Megan P, DO   9 months ago Acute left-sided low back pain with left-sided sciatica   [place] [facility], Megan P,  DO   9 months ago Left hip pain   [place] [facility] Mecum, Erin E, PA-C   9 months ago Mild intermittent asthma with exacerbation    [facility] Mecum, Oswaldo Conroy, PA-C       Future Appointments             In 4 months Rice, Jamesetta Orleans, MD [facility] Rheumatology  traZODone (DESYREL) 100 MG tablet 180 tablet 0    Sig: Take 2 tablets (200 mg total) by mouth at bedtime.     Psychiatry: Antidepressants - Serotonin Modulator Passed - 05/10/2023  9:59 PM      Passed - Completed PHQ-2 or PHQ-9 in the last 360 days      Passed - Valid encounter within last 6 months    Recent Outpatient Visits           3 months ago Dizziness   [place] [facility] [place], [place] P, DO   8 months ago Acute left-sided low back pain with left-sided sciatica   [place] [facility], Megan P, DO   9 months ago Acute left-sided low back pain with left-sided sciatica   [place] [facility], Megan P, DO   9 months ago Left hip pain   [place] [facility] Mecum, Erin E, PA-C   9 months ago Mild intermittent asthma with exacerbation   [place] [facility] Mecum, Oswaldo Conroy, PA-C       Future Appointments             In 4 months Rice, Jamesetta Orleans, MD [facility]               Requested Prescriptions  Pending Prescriptions Disp Refills   atorvastatin (LIPITOR) 40 MG tablet 90 tablet 0    Sig: Take 1 tablet (40 mg total) by mouth at bedtime. OFFICE VISIT NEEDED FOR ADDITIONAL REFILLS     Cardiovascular:  Antilipid - Statins Failed - 05/10/2023  9:59 PM      Failed - Lipid Panel in normal range within the last 12 months    Cholesterol, Total  Date Value Ref Range Status  12/24/2021 268 (H) 100 - 199 mg/dL Final   LDL Cholesterol (Calc)  Date Value Ref Range Status  11/01/2018 162 (H) mg/dL (calc) Final    Comment:    Reference  range: <100 . Desirable range <100 mg/dL for primary prevention;   <70 mg/dL for patients with CHD or diabetic patients  with > or = 2 CHD risk factors. Marland Kitchen LDL-C is now calculated using the Martin-Hopkins  calculation, which is a validated novel method providing  better accuracy than the Friedewald equation in the  estimation of LDL-C.  Horald Pollen et al. Lenox Ahr. 8295;621(30): 2061-2068  (http://education.[facility].com/faq/FAQ164)    LDL Chol Calc (NIH)  Date Value Ref Range Status  12/24/2021 186 (H) 0 - 99 mg/dL Final   Direct LDL  Date Value Ref Range Status  02/13/2012 178.3 mg/dL Final    Comment:    Optimal:  <100 mg/dLNear or Above Optimal:  100-129 mg/dLBorderline High:  130-159 mg/dLHigh:  160-189 mg/dLVery High:  >190 mg/dL   HDL  Date Value Ref Range Status  12/24/2021 55 >39 mg/dL Final   Triglycerides  Date Value Ref Range Status  12/24/2021 148 0 - 149 mg/dL Final         Passed - Patient is not pregnant      Passed - Valid encounter within last 12 months    Recent Outpatient Visits           3 months ago Dizziness   [place] [facility] [place], Megan P, DO   8 months ago Acute left-sided low back pain with left-sided sciatica   [place] [facility], Megan P, DO   9 months ago Acute  left-sided low back pain with left-sided sciatica   [place] [facility], [place] P, DO   9 months ago Left hip pain   [place] [facility] Mecum, Oswaldo Conroy, PA-C   9 months ago Mild intermittent asthma with exacerbation   [place] [facility] Mecum, Oswaldo Conroy, PA-C       Future Appointments             In 4 months Rice, Jamesetta Orleans, MD [facility] Rheumatology            Signed Prescriptions Disp Refills   buPROPion (WELLBUTRIN XL) 300 MG 24 hr tablet 90 tablet 0    Sig: Take 1 tablet (300 mg total) by mouth daily.     Psychiatry: Antidepressants - bupropion Passed  - 05/10/2023  9:59 PM      Passed - Cr in normal range and within 360 days    Creat  Date Value Ref Range Status  03/30/2023 0.95 0.50 - 1.03 mg/dL Final         Passed - AST in normal range and within 360 days    AST  Date Value Ref Range Status  03/30/2023 14 10 - 35 U/L Final         Passed - ALT in normal range and within 360 days    ALT  Date Value Ref Range Status  03/30/2023 12 6 - 29 U/L Final         Passed - Completed PHQ-2 or PHQ-9 in the last 360 days      Passed - Last BP in normal range    BP Readings from Last 1 Encounters:  03/30/23 115/82         Passed - Valid encounter within last 6 months    Recent Outpatient Visits           3 months ago Dizziness   [place] [facility] [place], [place] P, DO   8 months ago Acute left-sided low back pain with left-sided sciatica   [place], Megan P, DO   9 months ago Acute left-sided low back pain with left-sided sciatica   [place] [facility], Megan P, DO   9 months ago Left hip pain   [place] [facility] Mecum, Erin E, PA-C   9 months ago Mild intermittent asthma with exacerbation   [place] [facility] Mecum, Oswaldo Conroy, PA-C       Future Appointments             In 4 months Rice, Jamesetta Orleans, MD [facility] Rheumat[facility]             buPROPion (WELLBUTRIN XL) 150 MG 24 hr tablet 90 tablet 0    Sig: Take 1 tablet (150 mg total) by mouth daily.     Psychiatry: Antidepressants - bupropion Passed - 05/10/2023  9:59 PM      Passed - Cr in normal range and within 360 days    Creat  Date Value Ref Range Status  03/30/2023 0.95 0.50 - 1.03 mg/dL Final         Passed - AST in normal range and within 360 days    AST  Date Value Ref Range Status  03/30/2023 14 10 - 35 U/L Final         Passed - ALT in normal range and within 360 days    ALT  Date  Value Ref Range Status  03/30/2023 12 6 - 29 U/L  Final         Passed - Completed PHQ-2 or PHQ-9 in the last 360 days      Passed - Last BP in normal range    BP Readings from Last 1 Encounters:  03/30/23 115/82         Passed - Valid encounter within last 6 months    Recent Outpatient Visits           3 months ago Dizziness   [place] [facility] [place], [place] P, DO   8 months ago Acute left-sided low back pain with left-sided sciatica   [place] [facility], Megan P, DO   9 months ago Acute left-sided low back pain with left-sided sciatica   [place] [facility], Megan P, DO   9 months ago Left hip pain   [place] [facility]um, Erin E, PA-C   9 months ago Mild intermittent asthma with exacerbation   [place] [facility] Mecum, Oswaldo Conroy, PA-C       Future Appointments             In 4 months Rice, Jamesetta Orleans, MD [facility] Rheumat[facility]             gabapentin (NEURONTIN) 300 MG capsule 90 capsule 0    Sig: Take 1 capsule (300 mg total) by mouth at bedtime.     Neurology: Anticonvulsants - gabapentin Passed - 05/10/2023  9:59 PM      Passed - Cr in normal range and within 360 days    Creat  Date Value Ref Range Status  03/30/2023 0.95 0.50 - 1.03 mg/dL Final         Passed - Completed PHQ-2 or PHQ-9 in the last 360 days      Passed - Valid encounter within last 12 months    Recent Outpatient Visits           3 months ago Dizziness   [place] [facility] [place], [place] P, DO   8 months ago Acute left-sided low back pain with left-sided sciatica   [place] [facility], Megan P, DO   9 months ago Acute left-sided low back pain with left-sided sciatica   [place] [facility], Megan P, DO   9 months ago Left hip pain   [place] [facility] Mecum, Erin E, PA-C   9 months ago Mild intermittent asthma with exacerbation   [facility] Mecum, Oswaldo Conroy, PA-C       Future Appointments             In 4 months Rice, Jamesetta Orleans, MD  Rheumatology             sertraline (ZOLOFT) 50 MG tablet 90 tablet 0    Sig: Take 1 tablet (50 mg total) by mouth daily.     Psychiatry:  Antidepressants - SSRI - sertraline Passed - 05/10/2023  9:59 PM      Passed - AST in normal range and within 360 days    AST  Date Value Ref Range Status  03/30/2023 14 10 - 35 U/L Final         Passed - ALT in normal range and within 360 days    ALT  Date Value Ref Range Status  03/30/2023 12 6 -  29 U/L Final         Passed - Completed PHQ-2 or PHQ-9 in the last 360 days      Passed - Valid encounter within last 6 months    Recent Outpatient Visits           3 months ago Dizziness   [place] [facility] [place], [place] P, DO   8 months ago Acute left-sided low back pain with left-sided sciatica   [place] [facility], Megan P, DO   9 months ago Acute left-sided low back pain with left-sided sciatica   [place] [facility], Megan P, DO   9 months ago Left hip pain   [place] [facility], Erin E, PA-C   9 months ago Mild intermittent asthma with exacerbation   [place] [facility] Mecum, Oswaldo Conroy, PA-C       Future Appointments             In 4 months Rice, Jamesetta Orleans, MD [facility] Rheumatology             traZODone (DESYREL) 100 MG tablet 180 tablet 0    Sig: Take 2 tablets (200 mg total) by mouth at bedtime.     Psychiatry: Antidepressants - Serotonin Modulator Passed - 05/10/2023  9:59 PM      Passed - Completed PHQ-2 or PHQ-9 in the last 360 days      Passed - Valid encounter within last 6 months    Recent Outpatient Visits           3 months ago Dizziness   [place] [place], [place] P, DO   8 months ago Acute left-sided low back pain with  left-sided sciatica   [place] [facility], Megan P, DO   9 months ago Acute left-sided low back pain with left-sided sciatica   [place] [facility], Megan P, DO   9 months ago Left hip pain   [place] [facility] Mecum, Erin E, PA-C   9 months ago Mild intermittent asthma with exacerbation    [facility] Mecum, Oswaldo Conroy, PA-C       Future Appointments             In 4 months Rice, Jamesetta Orleans, MD [facility] Rheumatology

## 2023-05-12 NOTE — Telephone Encounter (Signed)
Requested Prescriptions  Pending Prescriptions Disp Refills   buPROPion (WELLBUTRIN XL) 300 MG 24 hr tablet 90 tablet 0    Sig: Take 1 tablet (300 mg total) by mouth daily.     Psychiatry: Antidepressants - bupropion Passed - 05/10/2023  9:59 PM      Passed - Cr in normal range and within 360 days    Creat  Date Value Ref Range Status  03/30/2023 0.95 0.50 - 1.03 mg/dL Final         Passed - AST in normal range and within 360 days    AST  Date Value Ref Range Status  03/30/2023 14 10 - 35 U/L Final         Passed - ALT in normal range and within 360 days    ALT  Date Value Ref Range Status  03/30/2023 12 6 - 29 U/L Final         Passed - Completed PHQ-2 or PHQ-9 in the last 360 days      Passed - Last BP in normal range    BP Readings from Last 1 Encounters:  03/30/23 115/82         Passed - Valid encounter within last 6 months    Recent Outpatient Visits           3 months ago Dizziness   [place] [facility] [place], [place] P, DO   8 months ago Acute left-sided low back pain with left-sided sciatica   [place] [facility], Megan P, DO   9 months ago Acute left-sided low back pain with left-sided sciatica   [place] [facility], Megan P, DO   9 months ago Left hip pain   [place] [facility] Mecum, Erin E, PA-C   9 months ago Mild intermittent asthma with exacerbation   [place] [facility] Mecum, Oswaldo Conroy, PA-C       Future Appointments             In 4 months Rice, Jamesetta Orleans, MD [facility]             buPROPion (WELLBUTRIN XL) 150 MG 24 hr tablet 90 tablet 0    Sig: Take 1 tablet (150 mg total) by mouth daily.     Psychiatry: Antidepressants - bupropion Passed - 05/10/2023  9:59 PM      Passed - Cr in normal range and within 360 days    Creat  Date Value Ref Range Status  03/30/2023 0.95 0.50 - 1.03 mg/dL Final         Passed - AST in  normal range and within 360 days    AST  Date Value Ref Range Status  03/30/2023 14 10 - 35 U/L Final         Passed - ALT in normal range and within 360 days    ALT  Date Value Ref Range Status  03/30/2023 12 6 - 29 U/L Final         Passed - Completed PHQ-2 or PHQ-9 in the last 360 days      Passed - Last BP in normal range    BP Readings from Last 1 Encounters:  03/30/23 115/82         Passed - Valid encounter within last 6 months    Recent Outpatient Visits           3 months ago Dizziness   [place] [facility] [place], Megan P, DO   8 months ago Acute left-sided low back pain with left-sided sciatica   [place] [facility] [place], [place] P, DO   9 months ago Acute left-sided low back pain with left-sided sciatica   [place] [facility], Megan P, DO   9 months ago Left hip pain   [place] [facility] Mecum, Oswaldo Conroy, PA-C   9 months ago Mild intermittent asthma with exacerbation   [place] [facility] Mecum, Oswaldo Conroy, PA-C       Future Appointments             In 4 months Rice, Jamesetta Orleans, MD [facility]             gabapentin (NEURONTIN) 300 MG capsule 90 capsule 0    Sig: Take 1 capsule (300 mg total) by mouth at bedtime.     Neurology: Anticonvulsants - gabapentin Passed - 05/10/2023  9:59 PM      Passed - Cr in normal range and within 360 days    Creat  Date Value Ref Range Status  03/30/2023 0.95 0.50 - 1.03 mg/dL Final         Passed - Completed PHQ-2 or PHQ-9 in the last 360 days      Passed - Valid encounter within last 12 months    Recent Outpatient Visits           3 months ago Dizziness   [place] [facility] [place], [place] P, DO   8 months ago Acute left-sided low back pain with left-sided sciatica   [facility], Megan P, DO   9 months ago Acute left-sided low back pain with left-sided  sciatica   [place] [facility], Megan P, DO   9 months ago Left hip pain   [place] [facility]ecum, Erin E, PA-C   9 months ago Mild intermittent asthma with exacerbation   [place] [facility] Mecum, Oswaldo Conroy, PA-C       Future Appointments             In 4 months Rice, Jamesetta Orleans, MD [facility]             atorvastatin (LIPITOR) 40 MG tablet 90 tablet 0    Sig: Take 1 tablet (40 mg total) by mouth at bedtime. OFFICE VISIT NEEDED FOR ADDITIONAL REFILLS     Cardiovascular:  Antilipid - Statins Failed - 05/10/2023  9:59 PM      Failed - Lipid Panel in normal range within the last 12 months    Cholesterol, Total  Date Value Ref Range Status  12/24/2021 268 (H) 100 - 199 mg/dL Final   LDL Cholesterol (Calc)  Date Value Ref Range Status  11/01/2018 162 (H) mg/dL (calc) Final    Comment:    Reference range: <100 . Desirable range <100 mg/dL for primary prevention;   <70 mg/dL for patients with CHD or diabetic patients  with > or = 2 CHD risk factors. Marland Kitchen LDL-C is now calculated using the Martin-Hopkins  calculation, which is a validated novel method providing  better accuracy than the Friedewald equation in the  estimation of LDL-C.  Horald Pollen et al. Lenox Ahr. 5621;308(65): 2061-2068  (http://education.[employer].com/faq/FAQ164)    LDL Chol Calc (NIH)  Date Value Ref Range Status  12/24/2021 186 (H) 0 - 99 mg/dL Final   Direct  LDL  Date Value Ref Range Status  02/13/2012 178.3 mg/dL Final    Comment:    Optimal:  <100 mg/dLNear or Above Optimal:  100-129 mg/dLBorderline High:  130-159 mg/dLHigh:  160-189 mg/dLVery High:  >190 mg/dL   HDL  Date Value Ref Range Status  12/24/2021 55 >39 mg/dL Final   Triglycerides  Date Value Ref Range Status  12/24/2021 148 0 - 149 mg/dL Final         Passed - Patient is not pregnant      Passed - Valid encounter within last 12 months    Recent  Outpatient Visits           3 months ago Dizziness   [place] [facility] [place], Megan P, DO   8 months ago Acute left-sided low back pain with left-sided sciatica   [place] [facility], Megan P, DO   9 months ago Acute left-sided low back pain with left-sided sciatica   [place] [facility], Megan P, DO   9 months ago Left hip pain   [place] [facility] Mecum, Erin E, PA-C   9 months ago Mild intermittent asthma with exacerbation   [place] [facility] Mecum, Oswaldo Conroy, PA-C       Future Appointments             In 4 months Rice, Jamesetta Orleans, MD [place] [facility]             sertraline (ZOLOFT) 50 MG tablet 90 tablet 0    Sig: Take 1 tablet (50 mg total) by mouth daily.     Psychiatry:  Antidepressants - SSRI - sertraline Passed - 05/10/2023  9:59 PM      Passed - AST in normal range and within 360 days    AST  Date Value Ref Range Status  03/30/2023 14 10 - 35 U/L Final         Passed - ALT in normal range and within 360 days    ALT  Date Value Ref Range Status  03/30/2023 12 6 - 29 U/L Final         Passed - Completed PHQ-2 or PHQ-9 in the last 360 days      Passed - Valid encounter within last 6 months    Recent Outpatient Visits           3 months ago Dizziness   [place] [facility] [place], Megan P, DO   8 months ago Acute left-sided low back pain with left-sided sciatica   [place] [facility], Megan P, DO   9 months ago Acute left-sided low back pain with left-sided sciatica    [facility], Megan P, DO   9 months ago Left hip pain    [facility], Erin E, PA-C   9 months ago Mild intermittent asthma with exacerbation    [facility] Mec[facility], Oswaldo Conroy, PA-C       Future Appointments             In 4 months Rice, Jamesetta Orleans, MD [facility]             traZODone (DESYREL) 100 MG tablet 180 tablet 0    Sig: Take 2 tablets (200 mg total) by mouth at bedtime.     Psychiatry: Antidepressants - Serotonin Modulator Passed - 05/10/2023  9:59 PM  Passed - Completed PHQ-2 or PHQ-9 in the last 360 days      Passed - Valid encounter within last 6 months    Recent Outpatient Visits           3 months ago Dizziness   [place] [facility] [place], [place] P, DO   8 months ago Acute left-sided low back pain with left-sided sciatica   [place] [facility], Megan P, DO   9 months ago Acute left-sided low back pain with left-sided sciatica   [place] [facility], Megan P, DO   9 months ago Left hip pain   [place] [facility] Mecum, Erin E, PA-C   9 months ago Mild intermittent asthma with exacerbation    [facility] Mecum, Oswaldo Conroy, PA-C       Future Appointments             In 4 months Rice, Jamesetta Orleans, MD [facility]

## 2023-05-12 NOTE — Telephone Encounter (Signed)
Attempted to reach patient to schedule her follow up, LVM to call office back.  Put in CRM.

## 2023-05-12 NOTE — Telephone Encounter (Signed)
Over due for follow up

## 2023-05-13 ENCOUNTER — Other Ambulatory Visit (HOSPITAL_COMMUNITY): Payer: Self-pay

## 2023-05-27 NOTE — Therapy (Signed)
OUTPATIENT PHYSICAL THERAPY THORACOLUMBAR EVALUATION   Patient Name: Emma Rogers MRN: [id number] DOB:06-03-1966, 57 y.o., female Today's Date: 05/28/2023  END OF SESSION:  PT End of Session - 05/28/23 1255     Visit Number 1    Number of Visits 13    Date for PT Re-Evaluation 07/09/23    PT Start Time 1300    PT Stop Time 1347    PT Time Calculation (min) 47 min    Activity Tolerance Patient tolerated treatment well    Behavior During Therapy [facility] for tasks assessed/performed             Past Medical History:  Diagnosis Date   Abnormal MRI 04/03/2019   Allergic rhinitis    Anxiety    Asthma    Depression    Headache(784.0)    History of iritis 11/27/2020   History of kidney stones    PONV (postoperative nausea and vomiting)    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    Shingles    Past Surgical History:  Procedure Laterality Date   BREAST BIOPSY Left 09/30/2002   Ductogram-neg   INSERT / REPLACE / REMOVE PACEMAKER     Permanent pacemaker, Dr. [name]   Lobe hemithroidectomy     Right   LUMBAR FUSION  2004   w/ Dr. Wynetta Emery   LUMBAR LAMINECTOMY  1989   SPINAL FUSION  01/09/2023   L3 and L4, L5 and L6   TOTAL ABDOMINAL HYSTERECTOMY  2009   still has 1 ovary   Patient Active Problem List   Diagnosis Date Noted   Spinal stenosis of lumbar region 01/09/2023   Mild intermittent asthma with exacerbation 07/18/2022   Diarrhea, psychogenic 06/26/2022   Nephrolithiasis 02/20/2022   Class 1 obesity due to excess calories with serious comorbidity and body mass index (BMI) of 32.0 to 32.9 in adult 07/15/2021   Psoriatic arthritis (HCC) 11/27/2020   Seronegative spondyloarthropathy 11/27/2020   Vertigo 06/16/2019   MDD (major depressive disorder), recurrent episode, moderate (HCC) 06/07/2019   GAD (generalized anxiety  disorder) 05/26/2019   Memory deficit 04/26/2019   Chronic low back pain without sciatica 03/08/2019   Seizure-like activity (HCC) 03/08/2019   Moderate persistent asthma 01/27/2019   Cervical spinal stenosis 12/30/2018   Benign essential tremor 12/30/2018   Chronic venous insufficiency 03/10/2018   Lymphedema 03/10/2018   Familial hyperlipidemia 10/10/2017   Chronic kidney disease, stage III (moderate) (HCC) 09/03/2017   Vitamin D deficiency 09/03/2017   Vitamin B12 deficiency 09/03/2017   Insomnia 08/11/2017   Menopausal symptoms 08/11/2017   Migraine without aura and without status migrainosus, not intractable 02/21/2017   Prediabetes 02/20/2017   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 12/03/2009   VASOVAGAL SYNCOPE 11/27/2009   Allergic rhinitis 01/18/2008    PCP: Dorcas Carrow, DO  REFERRING PROVIDER: Donalee Citrin, MD  REFERRING DIAG: M48.00 (ICD-10-CM) - Spinal stenosis  Rationale for Evaluation and Treatment: Rehabilitation  THERAPY DIAG:  Muscle weakness (generalized)  Other low back pain  Difficulty in walking, not elsewhere classified  ONSET DATE: ~06/2022  SUBJECTIVE:  SUBJECTIVE STATEMENT: In October of 2023, patient and husband went on a cruise and fell down the stairs. Sine then, she has had back problems leading to a fusion in 12/2022. After fusion, patient reports feeling good but started hurting again after 2 months. Unable to walk long distances (~10 minutes), stand/sit for prolonged periods of time. When she steps with her R leg, patient has sharp pain across groin and down front of her thigh which feels like she is going to buckle. Uses a cane if she feels unsteady.  Insurance wants her to participate in therapy prior to approving surgery.  Has had  PT prior to last surgery Larey Seat on Thursday 8/29 on grass and has been hurting since then.   PERTINENT HISTORY:  S/p decompressive lumbar laminectomy L3-4 and PLIF on 01/09/23. Had another fusion 20 years ago.  Per Dr. Lonie Peak note on 05/07/23, patient with significant fishmouth retrolisthesis at L2-3 above fusion.  PMH: anxiety, asthma, depression, pacemaker, renal insufficiency, psoriatic arthritis, back surgery  PAIN:  Are you having pain? Yes: NPRS scale: 7/10 - at worst 10/10 Pain location: back Pain description: stabbing  Aggravating factors: walking, sitting, standing, stooping/squatting Relieving factors: heat   PRECAUTIONS: None  RED FLAGS: Cauda equina syndrome: Yes: started 2 months after surgery R>L     WEIGHT BEARING RESTRICTIONS: No  FALLS:  Has patient fallen in last 6 months? Yes. Number of falls 2  LIVING ENVIRONMENT: Lives with: lives with their spouse Lives in: House/apartment Stairs: Yes: External: 4 steps; on right going up Has following equipment at home: Single point cane  OCCUPATION: Armed forces logistics/support/administrative officer for [employer]   PLOF: Independent  PATIENT GOALS: to get stronger before surgery and help relieve pain   NEXT MD VISIT: TBD   OBJECTIVE:   DIAGNOSTIC FINDINGS:  EXAM: CT LUMBAR SPINE WITHOUT CONTRAST   IMPRESSION: 1. Chronic solid fusion at L4-5. More recent fusion at the L3-4 level. Interbody spacers appear well positioned without significant subsidence. No hardware complication. No encroachment upon the canal or foramina. 2. L2-3 shows 2-3 mm retrolisthesis. Circumferential protrusion of the disc. Mild facet and ligamentous hypertrophy. Moderate multifactorial stenosis that appears more pronounced than was seen at myelography in March of this year. 3. L5-S1: Mild bulging of the disc. Bilateral facet osteoarthritis, right worse than left. No apparent compressive stenosis. No significant change since March.  PATIENT SURVEYS:   FOTO 36 with goal of 93  SCREENING FOR RED FLAGS: Bowel or bladder incontinence: No Spinal tumors: No Cauda equina syndrome: Yes: started 2 months after surgery, MD is aware of this  Compression fracture: No Abdominal aneurysm: No  COGNITION: Overall cognitive status: Within functional limits for tasks assessed     SENSATION: Light touch: Impaired -- R inner thigh, lateral shin, lateral ankle diminished compared to L  MUSCLE LENGTH: Hamstrings: tightness noted R>L  Piriformis: tightness noted R > L   POSTURE: rounded shoulders and forward head  PALPATION: TTP to R lumbar paraspinals and ~L2-3  LUMBAR ROM:   AROM eval  Flexion 50%  Extension 25%  Right lateral flexion Fingertips to mid thigh   Left lateral flexion Finger tips to mid thigh   Right rotation 75%   Left rotation 75%   (Blank rows = not tested)  LOWER EXTREMITY MMT:    MMT Right eval Left eval  Hip flexion 3+ 4  Hip abduction 4- 4  Hip adduction 4- 4  Hip internal rotation 3+ 4-  Hip external rotation  3+ 4-  Knee flexion 4 4+  Knee extension 4 4+  Ankle dorsiflexion 4+ 4+  Ankle plantarflexion 5 5   (Blank rows = not tested) **All positions caused pain in the back   LUMBAR SPECIAL TESTS:  Not tested   FUNCTIONAL TESTS:  5 times sit to stand: 27.13 secs 6 minute walk test: NT on initial eval   GAIT: Distance walked: 50' Assistive device utilized: None Level of assistance: Modified independence Comments: decreased stance time on the R,   FLOOR TRANSFER:  To be assessed on visit #2   TODAY'S TREATMENT:                                                                                                                              DATE: 05/28/23   HEP handout review and demonstration. Patient able to return demonstrate stretches.   PATIENT EDUCATION:  Education details: HEP, POC, goals  Person educated: Patient Education method: Explanation, Demonstration, and Handouts Education  comprehension: verbalized understanding and needs further education  HOME EXERCISE PROGRAM: Access Code: [email or web address] URL: [email or web address] Date: 05/28/2023 Prepared by: Maylon Peppers  Exercises - Seated Flexion Stretch with Swiss Ball  - 2-3 x daily - 5-7 x weekly - 10 reps - 5-10 second hold - Seated Piriformis Stretch  - 2-3 x daily - 5-7 x weekly - 5 reps - 30 second hold - Seated Hamstring Stretch  - 2-3 x daily - 5-7 x weekly - 5 reps - 30 second hold  ASSESSMENT:  CLINICAL IMPRESSION: Patient is a 58 y.o. female who was seen today for physical therapy evaluation and treatment for chronic low back pain s/p lumbar fusion 12/2022 and awaiting another back surgery. Patient with recent spinal fusion 12/2022 with pain relief for 2 months. She reports history of falls which tends to exacerbate her pain along with walking, prolonged sitting, prolonged standing, and stooping/squatting. Patient demonstrates hamstring/piriformis tightness, BLE weakness, decreased activity tolerance, impaired sensation, impaired lumbar ROM, and significant pain. Patient presents as pain dominant and will benefit from manual and light strengthening exercises initially. Patient will benefit from skilled PT interventions to address listed impairments to improve mobility and quality of life to assist with ability to perform tasks meaningful to her (I.e., playing with granddaughter).   OBJECTIVE IMPAIRMENTS: Abnormal gait, cardiopulmonary status limiting activity, decreased activity tolerance, decreased balance, decreased endurance, decreased mobility, difficulty walking, decreased strength, impaired flexibility, impaired sensation, improper body mechanics, postural dysfunction, and pain.   ACTIVITY LIMITATIONS: lifting, bending, sitting, standing, squatting, transfers, bed mobility, and locomotion level  PARTICIPATION LIMITATIONS: cleaning, laundry, driving, shopping, community activity, and  occupation  PERSONAL FACTORS: Age, Behavior pattern, Past/current experiences, Sex, and Time since onset of injury/illness/exacerbation are also affecting patient's functional outcome.   REHAB POTENTIAL: Fair    CLINICAL DECISION MAKING: Stable/uncomplicated  EVALUATION COMPLEXITY: Low   GOALS: Goals reviewed with patient? Yes  SHORT TERM GOALS: Target date: 06/25/2023  Patient will be independent in  HEP to improve strength/mobility for better functional independence with ADLs. Baseline: 9/5: HEP initiated  Goal status: INITIAL  LONG TERM GOALS: Target date: 07/23/2023  Patient will increase FOTO score to equal to or greater than 47 to demonstrate statistically significant improvement in mobility and quality of life.  Baseline: 9/5: 36 Goal status: INITIAL  2.   Patient (< 67 years old) will complete five times sit to stand test in < 10 seconds indicating an increased LE strength and improved balance. Baseline: 9/5: 27.13 seconds Goal status: INITIAL  3.  Patient will increase BLE gross strength by 1 MMT grade as to improve functional strength for independent gait, increased standing tolerance and increased ADL ability. Baseline: 9/5: see above  Goal status: INITIAL  4.  Patient will increase distance by 200' for progression to community ambulator and improve gait ability Baseline: 9/5: to be tested on visit #2  Goal status: INITIAL  5.  Patient able to get on/off floor with use of chair and supervision to be able to play with 36 y/o granddaughter.  Baseline: 9/5: to be assessed  Goal status: INITIAL  PLAN:  PT FREQUENCY: 1-2x/week  PT DURATION: 6 weeks  PLANNED INTERVENTIONS: Therapeutic exercises, Therapeutic activity, Neuromuscular re-education, Balance training, Gait training, Patient/Family education, Self Care, Joint mobilization, Stair training, DME instructions, Spinal mobilization, Cryotherapy, Moist heat, Manual therapy, and Re-evaluation.  PLAN FOR NEXT  SESSION: , assess floor transfer if patient able to, HEP review, manual therapy   Viviann Spare, PT, DPT  Physical Therapist - [facility]  05/28/2023, 2:06 PM

## 2023-05-28 ENCOUNTER — Ambulatory Visit: Payer: Commercial Managed Care - PPO | Attending: Neurosurgery

## 2023-05-28 ENCOUNTER — Encounter: Payer: Self-pay | Admitting: Physical Therapy

## 2023-05-28 ENCOUNTER — Ambulatory Visit (INDEPENDENT_AMBULATORY_CARE_PROVIDER_SITE_OTHER): Payer: Commercial Managed Care - PPO

## 2023-05-28 DIAGNOSIS — R262 Difficulty in walking, not elsewhere classified: Secondary | ICD-10-CM | POA: Diagnosis not present

## 2023-05-28 DIAGNOSIS — Z01810 Encounter for preprocedural cardiovascular examination: Secondary | ICD-10-CM | POA: Diagnosis not present

## 2023-05-28 DIAGNOSIS — M5459 Other low back pain: Secondary | ICD-10-CM

## 2023-05-28 DIAGNOSIS — M6281 Muscle weakness (generalized): Secondary | ICD-10-CM | POA: Diagnosis not present

## 2023-05-29 LAB — CUP PACEART REMOTE DEVICE CHECK
Battery Impedance: 1943 Ohm
Battery Remaining Longevity: 38 mo
Battery Voltage: 2.75 V
Brady Statistic AP VP Percent: 1 %
Brady Statistic AP VS Percent: 32 %
Brady Statistic AS VP Percent: 0 %
Brady Statistic AS VS Percent: 67 %
Date Time Interrogation Session: 20240905154310
Implantable Lead Connection Status: 753985
Implantable Lead Connection Status: 753985
Implantable Lead Implant Date: 19960806
Implantable Lead Implant Date: 19960806
Implantable Lead Location: 753859
Implantable Lead Location: 753860
Implantable Lead Model: 4524
Implantable Lead Model: 5034
Implantable Pulse Generator Implant Date: 20110328
Lead Channel Impedance Value: 1199 Ohm
Lead Channel Impedance Value: 289 Ohm
Lead Channel Pacing Threshold Amplitude: 1.25 V
Lead Channel Pacing Threshold Amplitude: 1.25 V
Lead Channel Pacing Threshold Pulse Width: 0.4 ms
Lead Channel Pacing Threshold Pulse Width: 0.4 ms
Lead Channel Setting Pacing Amplitude: 2.5 V
Lead Channel Setting Pacing Amplitude: 2.5 V
Lead Channel Setting Pacing Pulse Width: 0.4 ms
Lead Channel Setting Sensing Sensitivity: 2 mV
Zone Setting Status: 755011
Zone Setting Status: 755011

## 2023-06-02 ENCOUNTER — Encounter: Payer: Self-pay | Admitting: Physical Therapy

## 2023-06-02 ENCOUNTER — Ambulatory Visit: Payer: Commercial Managed Care - PPO | Admitting: Physical Therapy

## 2023-06-02 VITALS — BP 108/64 | HR 69

## 2023-06-02 DIAGNOSIS — R262 Difficulty in walking, not elsewhere classified: Secondary | ICD-10-CM | POA: Diagnosis not present

## 2023-06-02 DIAGNOSIS — M6281 Muscle weakness (generalized): Secondary | ICD-10-CM

## 2023-06-02 DIAGNOSIS — M5459 Other low back pain: Secondary | ICD-10-CM

## 2023-06-02 NOTE — Therapy (Signed)
OUTPATIENT PHYSICAL THERAPY TREATMENT   Patient Name: Emma Rogers MRN: [id number] DOB:06/08/1966, 57 y.o., female Today's Date: 06/02/2023  END OF SESSION:  PT End of Session - 06/02/23 1003     Visit Number 2    Number of Visits 13    Date for PT Re-Evaluation 07/09/23    Authorization Type [place] [employer] PPO reporting period from 05/28/2023    Authorization Time Period 25 visit limit PT/OT prior to medical review    PT Start Time 0946    PT Stop Time 1035    PT Time Calculation (min) 49 min    Activity Tolerance Patient limited by pain    Behavior During Therapy [facility] for tasks assessed/performed              Past Medical History:  Diagnosis Date   Abnormal MRI 04/03/2019   Allergic rhinitis    Anxiety    Asthma    Depression    Headache(784.0)    History of iritis 11/27/2020   History of kidney stones    PONV (postoperative nausea and vomiting)    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    Shingles    Past Surgical History:  Procedure Laterality Date   BREAST BIOPSY Left 09/30/2002   Ductogram-neg   INSERT / REPLACE / REMOVE PACEMAKER     Permanent pacemaker, Dr. [name]   Lobe hemithroidectomy     Right   LUMBAR FUSION  2004   w/ Dr. Wynetta Emery   LUMBAR LAMINECTOMY  1989   SPINAL FUSION  01/09/2023   L3 and L4, L5 and L6   TOTAL ABDOMINAL HYSTERECTOMY  2009   still has 1 ovary   Patient Active Problem List   Diagnosis Date Noted   Spinal stenosis of lumbar region 01/09/2023   Mild intermittent asthma with exacerbation 07/18/2022   Diarrhea, psychogenic 06/26/2022   Nephrolithiasis 02/20/2022   Class 1 obesity due to excess calories with serious comorbidity and body mass index (BMI) of 32.0 to 32.9 in adult 07/15/2021   Psoriatic arthritis (HCC) 11/27/2020   Seronegative spondyloarthropathy 11/27/2020    Vertigo 06/16/2019   MDD (major depressive disorder), recurrent episode, moderate (HCC) 06/07/2019   GAD (generalized anxiety disorder) 05/26/2019   Memory deficit 04/26/2019   Chronic low back pain without sciatica 03/08/2019   Seizure-like activity (HCC) 03/08/2019   Moderate persistent asthma 01/27/2019   Cervical spinal stenosis 12/30/2018   Benign essential tremor 12/30/2018   Chronic venous insufficiency 03/10/2018   Lymphedema 03/10/2018   Familial hyperlipidemia 10/10/2017   Chronic kidney disease, stage III (moderate) (HCC) 09/03/2017   Vitamin D deficiency 09/03/2017   Vitamin B12 deficiency 09/03/2017   Insomnia 08/11/2017   Menopausal symptoms 08/11/2017   Migraine without aura and without status migrainosus, not intractable 02/21/2017   Prediabetes 02/20/2017   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 12/03/2009   VASOVAGAL SYNCOPE 11/27/2009   Allergic rhinitis 01/18/2008    PCP: Dorcas Carrow, DO  REFERRING PROVIDER: Donalee Citrin, MD  REFERRING DIAG: M48.00 (ICD-10-CM) - Spinal stenosis  Rationale for Evaluation and Treatment: Rehabilitation  THERAPY DIAG:  Muscle weakness (generalized)  Other low back pain  Difficulty in walking, not elsewhere classified  ONSET DATE: ~06/2022  PERTINENT HISTORY:  Patient is a 57 y.o. female who presents to outpatient physical therapy with a referral for  medical diagnosis spinal stenosis. This patient's chief complaints consist of constant low back pain with intermittent to constant radiation into right groin, saddle region, and down right leg over anterior thigh, anterior/medial lower leg, to dorsal aspect of great toe, and intermittent left groin pain, weakness, and difficulty with basic function and work.   S/p decompressive lumbar laminectomy L3-4 and PLIF on 01/09/23. Had another fusion 20 years ago.  Per Dr. Lonie Peak note on 05/07/23, patient with significant fishmouth retrolisthesis at L2-3 above  fusion.  PMH: anxiety, asthma, depression, pacemaker, renal insufficiency, psoriatic arthritis, back surgery  In October of 2023, patient and husband went on a cruise and fell down the stairs. Sine then, she has had back problems leading to a fusion in 12/2022. After fusion, patient reports feeling good but started hurting again after 2 months. Unable to walk long distances (~10 minutes), stand/sit for prolonged periods of time. When she steps with her R leg, patient has sharp pain across groin and down front of her thigh which feels like she is going to buckle. Uses a cane if she feels unsteady.  Insurance wants her to participate in therapy prior to approving surgery.  Has had PT prior to last surgery. She does have cauda equina symptoms including B/B disruption and saddle paresthesia that was improved with surgery but returned in June. Fell on Thursday 8/29 on grass embankment (unsure how) which increased her pain.   SUBJECTIVE:                                                                                                                                                                                           SUBJECTIVE STATEMENT:  Patient states she is struggling today due to pain in her back and down her right leg. She states her right leg pain is intermittent on good days, but constant on bad days (like today). Her back pain is always present. Her leg pain improves when she lays on one of her sides, especially if she lays on her right side. She states she finished a prednisone taper on Sunday, which took the edge off but her pain is worsening again after she stopped it. She is planning to call Dr. Wynetta Emery per his request if her pain doesn't get better with prednisone. She has urinary urgency symptoms that worsened with her back pain getting worse. She has been able to get ot the bathroom. She also feels the need to have a BM when she has increased back pain. She also had saddle paresthesia prior to  surgery. She states these symptoms of cauda equina improved with surgery but are worsening again since June.  PAIN:  Are you having pain?  NPRS scale: 8/10 at right low back to groin, across anterior thigh, down anterior/medial tibia and over great toe. Saddle paresthesia present.    PRECAUTIONS: falls, did have back precautions after last surgery in April 2024, pacemaker  RED FLAGS: Cauda equina syndrome: Yes: started 2 months after surgery R>L     WEIGHT BEARING RESTRICTIONS: No  FALLS:  Has patient fallen in last 6 months? Yes. Number of falls 2 Feel on her bottom on an embankment then slid down (unsure what happened, after surgery), fell at grocery store (no idea, "my feet went out from under me"). Does feel more off balance.    OCCUPATION: Armed forces logistics/support/administrative officer for [employer], has not been back since her surgery.   PLOF: Independent  PATIENT GOALS: to get stronger before surgery and help relieve pain   NEXT MD VISIT: Patient is to call to make an appointment  OBJECTIVE:   Vitals:   06/02/23 1004  BP: 108/64  Pulse: 69  SpO2: 99%   DIAGNOSTIC FINDINGS:  Lumbar Spine CT report from 04/28/2023:  EXAM: CT LUMBAR SPINE WITHOUT CONTRAST    IMPRESSION: 1. Chronic solid fusion at L4-5. More recent fusion at the L3-4 level. Interbody spacers appear well positioned without significant subsidence. No hardware complication. No encroachment upon the canal or foramina. 2. L2-3 shows 2-3 mm retrolisthesis. Circumferential protrusion of the disc. Mild facet and ligamentous hypertrophy. Moderate multifactorial stenosis that appears more pronounced than was seen at myelography in March of this year. 3. L5-S1: Mild bulging of the disc. Bilateral facet osteoarthritis, right worse than left. No apparent compressive stenosis. No significant change since March.  FUNCTIONAL TESTS:  6 minute walk test: 805 feet, discontinued with 1:30 minutes to go due to increased pain in  low back, right leg, and left groin to at least 8/10; and legs "feel like spaghetti" and a popping started at low back (which she states is normal).  Floor transfer test: 35 seconds using chair. Very painful at right low back and down right leg. Reminded of weakness of right hip flexor.   TODAY'S TREATMENT:  Therapeutic exercise: to centralize symptoms and improve ROM, strength, muscular endurance, and activity tolerance required for successful completion of functional activities.  - vitals check for safety screening (see above).  - education about cauda equina symptoms - 6 Minute Walk Test (see above).  - Floor Transfer Test (see above) - seated lumbar flexion roll out with clear theraball, 1x2 min at self selected pace.  - review of seated piriformis stretch from HEP (unable to perform on right due to pain with trying to move to figure 4 position), 1x30 seconds on left (mild right sided irritation). Discontinued from HEP.  - review of seated hamstring stretch from HEP, but patient unable to perform well second to pain in right leg. Discontinued form HEP.  - seated sciatic nerve floss, 1x10 R side, 1x15 left side. Cuing for form and to limit R knee ROM to tolerance (concordant pain with full knee extension). Cuing used rubber band analogy.  - Education on HEP including handout   Pt required multimodal cuing for proper technique and to facilitate improved neuromuscular control, strength, range of motion, and functional ability resulting in improved performance and form.   PATIENT EDUCATION:  Education details: Exercise purpose/form. Self management techniques. Education on HEP including handout. Person educated: Patient Education method: Explanation, Demonstration, Verbal cues, and Handouts Education comprehension: verbalized understanding and needs further education  HOME EXERCISE PROGRAM: Access Code: [email or web address] URL: [email or web address] Date: 06/02/2023 Prepared by: Norton Blizzard  Exercises - Seated Flexion Stretch with Swiss Ball  - 1-2 x daily - 5-7 x weekly - 10 reps - 5-10 second hold - Seated Slump Nerve Glide  - 2 x daily - 1 sets - 15 reps  ASSESSMENT:  CLINICAL IMPRESSION: Patient arrives with elevated pain and found further testing irritating to her condition, thereby feeling slight increase in pain by end of session. Patient has significant limitations in Floor Transfer Test (35 seconds with use of chair) and 6 Minute Walk Test (805 feet, discontinued early due to pain and weakness), as well as evidence of symptomatic neural tension in R LE. HEP updated to include better tolerated exercises. Plan to focus on pain control and check repeated motions next session. Patient would benefit from continued management of limiting condition by skilled physical therapist to address remaining impairments and functional limitations to work towards stated goals and return to PLOF or maximal functional independence.   From initial PT evaluation on 05/28/2023:  Patient is a 57 y.o. female who was seen today for physical therapy evaluation and treatment for chronic low back pain s/p lumbar fusion 12/2022 and awaiting another back surgery. Patient with recent spinal fusion 12/2022 with pain relief for 2 months. She reports history of falls which tends to exacerbate her pain along with walking, prolonged sitting, prolonged standing, and stooping/squatting. Patient demonstrates hamstring/piriformis tightness, BLE weakness, decreased activity tolerance, impaired sensation, impaired lumbar ROM, and significant pain. Patient presents as pain dominant and will benefit from manual and light strengthening exercises initially. Patient will benefit from skilled PT interventions to address listed impairments to improve mobility and quality of life to assist with ability to perform tasks meaningful to her (I.e., playing with granddaughter).   OBJECTIVE IMPAIRMENTS: Abnormal gait, cardiopulmonary  status limiting activity, decreased activity tolerance, decreased balance, decreased endurance, decreased mobility, difficulty walking, decreased strength, impaired flexibility, impaired sensation, improper body mechanics, postural dysfunction, and pain.   ACTIVITY LIMITATIONS: lifting, bending, sitting, standing, squatting, transfers, bed mobility, and locomotion level  PARTICIPATION LIMITATIONS: cleaning, laundry, driving, shopping, community activity, and occupation  PERSONAL FACTORS: Age, Behavior pattern, Past/current experiences, Sex, and Time since onset of injury/illness/exacerbation are also affecting patient's functional outcome.   REHAB POTENTIAL: Fair    CLINICAL DECISION MAKING: Stable/uncomplicated  EVALUATION COMPLEXITY: Low   GOALS: Goals reviewed with patient? Yes  SHORT TERM GOALS: Target date: 06/25/2023  Patient will be independent in HEP to improve strength/mobility for better functional independence with ADLs. Baseline: 9/5: HEP initiated  Goal status: In-progress  LONG TERM GOALS: Target date: 07/23/2023  Patient will increase FOTO score to equal to or greater than 47 to demonstrate statistically significant improvement in mobility and quality of life.  Baseline: 9/5: 36.  Goal status: In-progress  2.   Patient (< 80 years old) will complete five times sit to stand test in < 10 seconds indicating an increased LE strength and improved balance. Baseline: 9/5: 27.13 seconds.  Goal status: In-progress  3.  Patient will increase BLE gross strength by 1 MMT grade as to improve functional strength for independent gait, increased standing tolerance and increased ADL ability. Baseline: 9/5: see above  Goal status: In-progress  4.  Patient will increase distance by 200' for progression to community ambulator and improve gait ability Baseline: 9/5: to be tested on visit #2. 06/02/2023: 805 feet, discontinued early due to pain and B LE weakness.  Goal  status:  In-progress  5.  Patient able to get on/off floor with use of chair and supervision to be able to play with 3 y/o granddaughter.  Baseline: 9/5: to be assessed. 06/02/2023: Floor Transfer Test 35 seconds with use of chair.  Goal status: In-progress  PLAN:  PT FREQUENCY: 1-2x/week  PT DURATION: 6 weeks  PLANNED INTERVENTIONS: Therapeutic exercises, Therapeutic activity, Neuromuscular re-education, Balance training, Gait training, Patient/Family education, Self Care, Joint mobilization, Stair training, DME instructions, Spinal mobilization, Cryotherapy, Moist heat, Manual therapy, and Re-evaluation.  PLAN FOR NEXT SESSION: update HEP as appropriate, progressive strengthening/functional strengthening as tolerated, education, ROM exercises, and manual therapy / dry needling as appropriate.   Cira Rue, PT, DPT  06/02/23, 11:41 AM  [facility] [address] [place], [place] [address] P: [phone] I F: [phone]

## 2023-06-02 NOTE — Progress Notes (Signed)
Remote pacemaker transmission.   

## 2023-06-04 ENCOUNTER — Ambulatory Visit: Payer: Commercial Managed Care - PPO | Admitting: Physical Therapy

## 2023-06-04 ENCOUNTER — Other Ambulatory Visit: Payer: Self-pay

## 2023-06-04 ENCOUNTER — Encounter: Payer: Self-pay | Admitting: Physical Therapy

## 2023-06-04 DIAGNOSIS — M48062 Spinal stenosis, lumbar region with neurogenic claudication: Secondary | ICD-10-CM | POA: Diagnosis not present

## 2023-06-04 DIAGNOSIS — R262 Difficulty in walking, not elsewhere classified: Secondary | ICD-10-CM

## 2023-06-04 DIAGNOSIS — M5459 Other low back pain: Secondary | ICD-10-CM

## 2023-06-04 DIAGNOSIS — M6281 Muscle weakness (generalized): Secondary | ICD-10-CM

## 2023-06-04 DIAGNOSIS — Z6833 Body mass index (BMI) 33.0-33.9, adult: Secondary | ICD-10-CM | POA: Diagnosis not present

## 2023-06-04 MED ORDER — TRAMADOL HCL 50 MG PO TABS
50.0000 mg | ORAL_TABLET | Freq: Four times a day (QID) | ORAL | 0 refills | Status: DC | PRN
  Filled 2023-06-04: qty 45, 12d supply, fill #0

## 2023-06-04 MED ORDER — METHOCARBAMOL 500 MG PO TABS
500.0000 mg | ORAL_TABLET | Freq: Four times a day (QID) | ORAL | 0 refills | Status: DC
  Filled 2023-06-04: qty 45, 12d supply, fill #0

## 2023-06-04 NOTE — Therapy (Signed)
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    Shingles    Past Surgical History:  Procedure Laterality Date   BREAST BIOPSY Left 09/30/2002   Ductogram-neg   INSERT / REPLACE / REMOVE PACEMAKER     Permanent pacemaker, Dr. [name]   Lobe hemithroidectomy     Right   LUMBAR FUSION  2004   w/ Dr. Wynetta Emery   LUMBAR LAMINECTOMY  1989   SPINAL FUSION  01/09/2023   L3 and L4, L5 and L6   TOTAL ABDOMINAL HYSTERECTOMY  2009   still has 1 ovary   Patient Active Problem List   Diagnosis Date Noted   Spinal stenosis of lumbar region 01/09/2023   Mild intermittent asthma with exacerbation 07/18/2022   Diarrhea, psychogenic 06/26/2022   Nephrolithiasis 02/20/2022   Class 1 obesity due to excess calories with serious comorbidity and body mass index (BMI) of 32.0 to 32.9 in adult 07/15/2021   Psoriatic arthritis (HCC) 11/27/2020   Seronegative spondyloarthropathy 11/27/2020   Vertigo 06/16/2019   MDD (major depressive disorder), recurrent episode, moderate (HCC) 06/07/2019   GAD (generalized anxiety disorder) 05/26/2019   Memory deficit 04/26/2019   Chronic low back pain without sciatica 03/08/2019   Seizure-like activity (HCC) 03/08/2019   Moderate persistent asthma 01/27/2019   Cervical spinal stenosis 12/30/2018   Benign essential tremor 12/30/2018   Chronic venous insufficiency 03/10/2018    Lymphedema 03/10/2018   Familial hyperlipidemia 10/10/2017   Chronic kidney disease, stage III (moderate) (HCC) 09/03/2017   Vitamin D deficiency 09/03/2017   Vitamin B12 deficiency 09/03/2017   Insomnia 08/11/2017   Menopausal symptoms 08/11/2017   Migraine without aura and without status migrainosus, not intractable 02/21/2017   Prediabetes 02/20/2017   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 12/03/2009   VASOVAGAL SYNCOPE 11/27/2009   Allergic rhinitis 01/18/2008    PCP: Dorcas Carrow, DO  REFERRING PROVIDER: Donalee Citrin, MD  REFERRING DIAG: M48.00 (ICD-10-CM) - Spinal stenosis  Rationale for Evaluation and Treatment: Rehabilitation  THERAPY DIAG:  Muscle weakness (generalized)  Other low back pain  Difficulty in walking, not elsewhere classified  ONSET DATE: ~06/2022  PERTINENT HISTORY:  Patient is a 57 y.o. female who presents to outpatient physical therapy with a referral for medical diagnosis spinal stenosis. This patient's chief complaints consist of constant low back pain with intermittent to constant radiation into right groin, saddle region, and down right leg over anterior thigh, anterior/medial lower leg, to dorsal aspect of great toe, and intermittent left groin pain, weakness, and difficulty with basic function and work.   S/p decompressive lumbar laminectomy L3-4 and PLIF on 01/09/23. Had another fusion 20 years ago.  Per Dr. Lonie Peak note on 05/07/23, patient with significant fishmouth retrolisthesis at L2-3 above fusion.  PMH: anxiety, asthma, depression, pacemaker, renal insufficiency, psoriatic arthritis, back surgery  In October of 2023,  patient and husband went on a cruise and fell down the stairs. Sine then, she has had back problems leading to a fusion in 12/2022. After fusion, patient reports feeling good but started hurting again after 2 months. Unable to walk long distances (~10 minutes), stand/sit for prolonged periods of time.  When she steps with her R leg, patient has sharp pain across groin and down front of her thigh which feels like she is going to buckle. Uses a cane if she feels unsteady.  Insurance wants her to participate in therapy prior to approving surgery.  Has had PT prior to last surgery. She does have cauda equina symptoms including B/B disruption and saddle paresthesia that was improved with surgery but returned in June. Fell on Thursday 8/29 on grass embankment (unsure how) which increased her pain.   SUBJECTIVE:                                                                                                                                                                                           SUBJECTIVE STATEMENT:  Patient called today after seeing her doctor today and reports that her doctors have recommended surgery and just need PT to discharge her from physical therapy to proceed. She states after her last PT session she could not do a lot and her husband was having to lift her leg up onto the couch. She was down for the rest of the day and the next day after her last PT session. She continues to have saddle paresthesia and urinary and bowel urgency. She did her HEP once yesterday that increased her symptoms. She was unable to do them prior to that due to elevated pain.   PAIN:  Are you having pain?  NPRS scale: patient not present but reports 8/10 at the time of the cal.    PRECAUTIONS: falls, did have back precautions after last surgery in April 2024, pacemaker  RED FLAGS: Cauda equina syndrome: Yes: started 2 months after surgery R>L     WEIGHT BEARING RESTRICTIONS: No  FALLS:  Has patient fallen in last 6 months? Yes. Number of falls 2 Feel on her bottom on an embankment then slid down (unsure what happened, after surgery), fell at grocery store (no idea, "my feet went out from under me"). Does feel more off balance.    OCCUPATION: Armed forces logistics/support/administrative officer for [employer], has not  been back since her surgery.   PLOF: Independent  PATIENT GOALS: to get stronger before surgery and help relieve pain   NEXT MD VISIT: Today  OBJECTIVE:   OBJECTIVE Patient is not present for examination at  this time. Please see previous documentation for latest objective data.   PATIENT EDUCATION:  Education details: Patient educated on appropriateness of surgery in the presence of cauda equina syndrome.  Person educated: Patient Education method: Explanation Education comprehension: verbalized understanding  HOME EXERCISE PROGRAM: Access Code: [email or web address] URL: [email or web address] Date: 06/02/2023 Prepared by: Norton Blizzard  Exercises - Seated Flexion Stretch with Swiss Ball  - 1-2 x daily - 5-7 x weekly - 10 reps - 5-10 second hold - Seated Slump Nerve Glide  - 2 x daily - 1 sets - 15 reps  ASSESSMENT:  CLINICAL IMPRESSION: Patient has attended 2 physical therapy sessions since starting current episode of care on 05/28/2023. She reports worsening symptoms of cauda equina syndrome (CES) symptoms including constant saddle paresthesia, urinary and bowel urgency associated with low back pain. Patient has also demonstrated lack of tolerance for PT interventions over the last two visits. Patient was recommended to contact physician for further medical assessment, as surgery to prevent permeant nerve injury is often needed in the case of CES). Referring physician has advised patient that surgery is recommenced and they can proceed once she is discharged from PT. Due to the urgency associated with cauda equina syndrome and lack of tolerance for physical therapy interventions, patient is now discharged from PT and recommended to follow physician's recommendations for care including surgery.   OBJECTIVE IMPAIRMENTS: Abnormal gait, cardiopulmonary status limiting activity, decreased activity tolerance, decreased balance, decreased endurance, decreased mobility, difficulty walking,  decreased strength, impaired flexibility, impaired sensation, improper body mechanics, postural dysfunction, and pain.   ACTIVITY LIMITATIONS: lifting, bending, sitting, standing, squatting, transfers, bed mobility, and locomotion level  PARTICIPATION LIMITATIONS: cleaning, laundry, driving, shopping, community activity, and occupation  PERSONAL FACTORS: Age, Behavior pattern, Past/current experiences, Sex, and Time since onset of injury/illness/exacerbation are also affecting patient's functional outcome.   REHAB POTENTIAL: Fair    CLINICAL DECISION MAKING: Stable/uncomplicated  EVALUATION COMPLEXITY: Low   GOALS: Goals reviewed with patient? Yes  SHORT TERM GOALS: Target date: 06/25/2023  Patient will be independent in HEP to improve strength/mobility for better functional independence with ADLs. Baseline: 9/5: HEP initiated  Goal status: NOT MET  LONG TERM GOALS: Target date: 07/23/2023  Patient will increase FOTO score to equal to or greater than 47 to demonstrate statistically significant improvement in mobility and quality of life.  Baseline: 9/5: 36.  Goal status: NOT MET  2.   Patient (< 7 years old) will complete five times sit to stand test in < 10 seconds indicating an increased LE strength and improved balance. Baseline: 9/5: 27.13 seconds.  Goal status: NOT MET  3.  Patient will increase BLE gross strength by 1 MMT grade as to improve functional strength for independent gait, increased standing tolerance and increased ADL ability. Baseline: 9/5: see objective.  Goal status: NOT MET  4.  Patient will increase distance by 200' for progression to community ambulator and improve gait ability Baseline: 9/5: to be tested on visit #2. 06/02/2023: 805 feet, discontinued early due to pain and B LE weakness.  Goal status: NOT MET  5.  Patient able to get on/off floor with use of chair and supervision to be able to play with 45 y/o granddaughter.  Baseline: 9/5: to be  assessed. 06/02/2023: Floor Transfer Test 35 seconds with use of chair.  Goal status: NOT MET  PLAN:  PT FREQUENCY: 1-2x/week  PT DURATION: 6 weeks  PLANNED INTERVENTIONS: Therapeutic exercises, Therapeutic activity, Neuromuscular re-education, Balance training, Gait training, Patient/Family  education, Self Care, Joint mobilization, Stair training, DME instructions, Spinal mobilization, Cryotherapy, Moist heat, Manual therapy, and Re-evaluation.  PLAN FOR NEXT SESSION: Patient is now discharged from PT.    Cira Rue, PT, DPT  06/04/23, 4:35 PM  [facility] [address] [place], [place] [address] P: [phone] I F: [phone]

## 2023-06-08 ENCOUNTER — Ambulatory Visit: Payer: Commercial Managed Care - PPO | Admitting: Physical Therapy

## 2023-06-10 ENCOUNTER — Encounter: Payer: Commercial Managed Care - PPO | Admitting: Physical Therapy

## 2023-06-10 ENCOUNTER — Other Ambulatory Visit (HOSPITAL_COMMUNITY): Payer: Self-pay

## 2023-06-11 DIAGNOSIS — M48062 Spinal stenosis, lumbar region with neurogenic claudication: Secondary | ICD-10-CM | POA: Diagnosis not present

## 2023-06-11 DIAGNOSIS — Z6833 Body mass index (BMI) 33.0-33.9, adult: Secondary | ICD-10-CM | POA: Diagnosis not present

## 2023-06-12 ENCOUNTER — Encounter (HOSPITAL_COMMUNITY): Payer: Self-pay

## 2023-06-12 ENCOUNTER — Other Ambulatory Visit: Payer: Self-pay | Admitting: Family Medicine

## 2023-06-12 ENCOUNTER — Other Ambulatory Visit (HOSPITAL_COMMUNITY): Payer: Self-pay

## 2023-06-15 ENCOUNTER — Encounter: Payer: Commercial Managed Care - PPO | Admitting: Physical Therapy

## 2023-06-15 NOTE — Telephone Encounter (Signed)
Does she have enough to make it to her appt?

## 2023-06-16 ENCOUNTER — Other Ambulatory Visit: Payer: Self-pay

## 2023-06-17 ENCOUNTER — Encounter: Payer: Commercial Managed Care - PPO | Admitting: Physical Therapy

## 2023-06-18 ENCOUNTER — Other Ambulatory Visit: Payer: Self-pay | Admitting: Neurosurgery

## 2023-06-19 ENCOUNTER — Other Ambulatory Visit: Payer: Self-pay

## 2023-06-19 MED ORDER — FUROSEMIDE 20 MG PO TABS
20.0000 mg | ORAL_TABLET | Freq: Every day | ORAL | 0 refills | Status: DC | PRN
  Filled 2023-06-19: qty 90, 90d supply, fill #0

## 2023-06-19 NOTE — Telephone Encounter (Signed)
Patient called back and states she is completely out of the medication.

## 2023-06-19 NOTE — Telephone Encounter (Signed)
Called and LVM asking for patient to please return my call.  

## 2023-06-23 ENCOUNTER — Encounter: Payer: Commercial Managed Care - PPO | Admitting: Physical Therapy

## 2023-06-25 ENCOUNTER — Encounter: Payer: Commercial Managed Care - PPO | Admitting: Physical Therapy

## 2023-06-26 ENCOUNTER — Encounter: Payer: Self-pay | Admitting: Cardiology

## 2023-06-29 ENCOUNTER — Encounter: Payer: Commercial Managed Care - PPO | Admitting: Physical Therapy

## 2023-06-30 ENCOUNTER — Encounter: Payer: Self-pay | Admitting: Family Medicine

## 2023-06-30 ENCOUNTER — Encounter: Payer: Self-pay | Admitting: Cardiology

## 2023-06-30 ENCOUNTER — Ambulatory Visit: Payer: Commercial Managed Care - PPO | Admitting: Family Medicine

## 2023-06-30 VITALS — BP 100/71 | HR 73 | Ht 64.5 in | Wt 210.2 lb

## 2023-06-30 DIAGNOSIS — E538 Deficiency of other specified B group vitamins: Secondary | ICD-10-CM | POA: Diagnosis not present

## 2023-06-30 DIAGNOSIS — F411 Generalized anxiety disorder: Secondary | ICD-10-CM | POA: Diagnosis not present

## 2023-06-30 DIAGNOSIS — N183 Chronic kidney disease, stage 3 unspecified: Secondary | ICD-10-CM

## 2023-06-30 DIAGNOSIS — E7849 Other hyperlipidemia: Secondary | ICD-10-CM

## 2023-06-30 DIAGNOSIS — Z0000 Encounter for general adult medical examination without abnormal findings: Secondary | ICD-10-CM

## 2023-06-30 DIAGNOSIS — E559 Vitamin D deficiency, unspecified: Secondary | ICD-10-CM | POA: Diagnosis not present

## 2023-06-30 DIAGNOSIS — Z23 Encounter for immunization: Secondary | ICD-10-CM

## 2023-06-30 DIAGNOSIS — R2232 Localized swelling, mass and lump, left upper limb: Secondary | ICD-10-CM | POA: Diagnosis not present

## 2023-06-30 DIAGNOSIS — R7303 Prediabetes: Secondary | ICD-10-CM

## 2023-06-30 DIAGNOSIS — F331 Major depressive disorder, recurrent, moderate: Secondary | ICD-10-CM

## 2023-06-30 LAB — MICROALBUMIN, URINE WAIVED
Creatinine, Urine Waived: 200 mg/dL (ref 10–300)
Microalb, Ur Waived: 30 mg/L — ABNORMAL HIGH (ref 0–19)
Microalb/Creat Ratio: <30 mg/g (ref <30)

## 2023-06-30 NOTE — Progress Notes (Signed)
PERIOPERATIVE PRESCRIPTION FOR IMPLANTED CARDIAC DEVICE PROGRAMMING  Patient Information: Name:  Emma Rogers  DOB:  01-02-1966  MRN:  [id number]    Planned Procedure:  PLIF L2-L3, extend fusion  Surgeon:  Dr. Donalee Citrin  Date of Procedure:  07/08/23  Cautery will be used.  Position during surgery:  prone   Please send documentation back to:  [facility] (Fax # [phone])  Device Information:  Clinic EP Physician:  Loman Brooklyn, MD   Device Type:  Pacemaker Manufacturer and Phone #:  Medtronic: [id number] Pacemaker Dependent?:  No. Date of Last Device Check:  05/28/2023 Normal Device Function?:  Yes.    Electrophysiologist's Recommendations:  Have magnet available. Provide continuous ECG monitoring when magnet is used or reprogramming is to be performed.  Procedure will likely interfere with device function.  Device should be programmed:  Asynchronous pacing during procedure and returned to normal programming after procedure  Per Device Clinic Standing Orders, Lenor Coffin, RN  12:15 PM 06/30/2023

## 2023-06-30 NOTE — Pre-Procedure Instructions (Signed)
Surgical Instructions   Your procedure is scheduled on Wednesday, October 16th. Report to [facility] Entrance "A" at 06:30 A.M., then check in with the Admitting office. Any questions or running late day of surgery: call [phone]  Questions prior to your surgery date: call [phone], Monday-Friday, 8am-4pm. If you experience any cold or flu symptoms such as cough, fever, chills, shortness of breath, etc. between now and your scheduled surgery, please notify us at the above number.     Remember:  Do not eat or drink after midnight the night before your surgery    Take these medicines the morning of surgery with A SIP OF WATER  buPROPion (WELLBUTRIN XL)  sertraline (ZOLOFT)    May take these medicines IF NEEDED: albuterol (VENTOLIN HFA)- bring inhaler with you on day of surgery methocarbamol (ROBAXIN)  rizatriptan (MAXALT)  traMADol (ULTRAM)    One week prior to surgery, STOP taking any Aspirin (unless otherwise instructed by your surgeon) Aleve, Naproxen, Ibuprofen, Motrin, Advil, Goody's, BC's, all herbal medications, fish oil, and non-prescription vitamins.                     Do NOT Smoke (Tobacco/Vaping) for 24 hours prior to your procedure.  If you use a CPAP at night, you may bring your mask/headgear for your overnight stay.   You will be asked to remove any contacts, glasses, piercing's, hearing aid's, dentures/partials prior to surgery. Please bring cases for these items if needed.    Patients discharged the day of surgery will not be allowed to drive home, and someone needs to stay with them for 24 hours.  SURGICAL WAITING ROOM VISITATION Patients may have no more than 2 support people in the waiting area - these visitors may rotate.   Pre-op nurse will coordinate an appropriate time for 1 ADULT support person, who may not rotate, to accompany patient in pre-op.  Children under the age of 56 must have an adult with them who is not the patient and must  remain in the main waiting area with an adult.  If the patient needs to stay at the hospital during part of their recovery, the visitor guidelines for inpatient rooms apply.  Please refer to the [facility] website for the visitor guidelines for any additional information.   If you received a COVID test during your pre-op visit  it is requested that you wear a mask when out in public, stay away from anyone that may not be feeling well and notify your surgeon if you develop symptoms. If you have been in contact with anyone that has tested positive in the last 10 days please notify you surgeon.      Pre-operative 5 CHG Bathing Instructions   You can play a key role in reducing the risk of infection after surgery. Your skin needs to be as free of germs as possible. You can reduce the number of germs on your skin by washing with CHG (chlorhexidine gluconate) soap before surgery. CHG is an antiseptic soap that kills germs and continues to kill germs even after washing.   DO NOT use if you have an allergy to chlorhexidine/CHG or antibacterial soaps. If your skin becomes reddened or irritated, stop using the CHG and notify one of our RNs at [phone].   Please shower with the CHG soap starting 4 days before surgery using the following schedule:     Please keep in mind the following:  DO NOT shave, including legs and underarms,  starting the day of your first shower.   You may shave your face at any point before/day of surgery.  Place clean sheets on your bed the day you start using CHG soap. Use a clean washcloth (not used since being washed) for each shower. DO NOT sleep with pets once you start using the CHG.   CHG Shower Instructions:  Wash your face and private area with normal soap. If you choose to wash your hair, wash first with your normal shampoo.  After you use shampoo/soap, rinse your hair and body thoroughly to remove shampoo/soap residue.  Turn the water OFF and apply about 3  tablespoons (45 ml) of CHG soap to a CLEAN washcloth.  Apply CHG soap ONLY FROM YOUR NECK DOWN TO YOUR TOES (washing for 3-5 minutes)  DO NOT use CHG soap on face, private areas, open wounds, or sores.  Pay special attention to the area where your surgery is being performed.  If you are having back surgery, having someone wash your back for you may be helpful. Wait 2 minutes after CHG soap is applied, then you may rinse off the CHG soap.  Pat dry with a clean towel  Put on clean clothes/pajamas   If you choose to wear lotion, please use ONLY the CHG-compatible lotions on the back of this paper.   Additional instructions for the day of surgery: DO NOT APPLY any lotions, deodorants, cologne, or perfumes.   Do not bring valuables to the hospital. [facility] is not responsible for any belongings/valuables. Do not wear nail polish, gel polish, artificial nails, or any other type of covering on natural nails (fingers and toes) Do not wear jewelry or makeup Put on clean/comfortable clothes.  Please brush your teeth.  Ask your nurse before applying any prescription medications to the skin.     CHG Compatible Lotions   Aveeno Moisturizing lotion  Cetaphil Moisturizing Cream  Cetaphil Moisturizing Lotion  Clairol Herbal Essence Moisturizing Lotion, Dry Skin  Clairol Herbal Essence Moisturizing Lotion, Extra Dry Skin  Clairol Herbal Essence Moisturizing Lotion, Normal Skin  Curel Age Defying Therapeutic Moisturizing Lotion with Alpha Hydroxy  Curel Extreme Care Body Lotion  Curel Soothing Hands Moisturizing Hand Lotion  Curel Therapeutic Moisturizing Cream, Fragrance-Free  Curel Therapeutic Moisturizing Lotion, Fragrance-Free  Curel Therapeutic Moisturizing Lotion, Original Formula  Eucerin Daily Replenishing Lotion  Eucerin Dry Skin Therapy Plus Alpha Hydroxy Crme  Eucerin Dry Skin Therapy Plus Alpha Hydroxy Lotion  Eucerin Original Crme  Eucerin Original Lotion  Eucerin Plus Crme  Eucerin Plus Lotion  Eucerin TriLipid Replenishing Lotion  Keri Anti-Bacterial Hand Lotion  Keri Deep Conditioning Original Lotion Dry Skin Formula Softly Scented  Keri Deep Conditioning Original Lotion, Fragrance Free Sensitive Skin Formula  Keri Lotion Fast Absorbing Fragrance Free Sensitive Skin Formula  Keri Lotion Fast Absorbing Softly Scented Dry Skin Formula  Keri Original Lotion  Keri Skin Renewal Lotion Keri Silky Smooth Lotion  Keri Silky Smooth Sensitive Skin Lotion  Nivea Body Creamy Conditioning Oil  Nivea Body Extra Enriched Lotion  Nivea Body Original Lotion  Nivea Body Sheer Moisturizing Lotion Nivea Crme  Nivea Skin Firming Lotion  NutraDerm 30 Skin Lotion  NutraDerm Skin Lotion  NutraDerm Therapeutic Skin Cream  NutraDerm Therapeutic Skin Lotion  ProShield Protective Hand Cream  Provon moisturizing lotion  Please read over the following fact sheets that you were given.

## 2023-06-30 NOTE — Patient Instructions (Signed)
Please call to schedule your mammogram and/or bone density: [facility]  Address: [address], [place], [place] [address] Phone: [phone]  [facility] [place] [address] [place],  [place]  [address] Phone: [phone]   
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
## 2023-07-01 ENCOUNTER — Encounter (HOSPITAL_COMMUNITY)
Admission: RE | Admit: 2023-07-01 | Discharge: 2023-07-01 | Disposition: A | Discharge status: Home / Self Care | Payer: Commercial Managed Care - PPO | Source: Ambulatory Visit | Attending: Neurosurgery | Admitting: Neurosurgery

## 2023-07-01 ENCOUNTER — Encounter (HOSPITAL_COMMUNITY): Payer: Self-pay

## 2023-07-01 ENCOUNTER — Other Ambulatory Visit: Payer: Self-pay

## 2023-07-01 VITALS — BP 130/70 | HR 74 | Temp 98.4°F | Resp 18 | Ht 64.5 in | Wt 212.3 lb

## 2023-07-01 DIAGNOSIS — Z01812 Encounter for preprocedural laboratory examination: Secondary | ICD-10-CM | POA: Diagnosis present

## 2023-07-01 DIAGNOSIS — Z01818 Encounter for other preprocedural examination: Secondary | ICD-10-CM

## 2023-07-01 HISTORY — DX: Syncope and collapse: R55

## 2023-07-01 LAB — COMPREHENSIVE METABOLIC PANEL
ALT: 18 [IU]/L (ref 0–32)
AST: 19 [IU]/L (ref 0–40)
Albumin: 4.4 g/dL (ref 3.8–4.9)
Alkaline Phosphatase: 76 [IU]/L (ref 44–121)
BUN/Creatinine Ratio: 23 (ref 9–23)
BUN: 30 mg/dL — ABNORMAL HIGH (ref 6–24)
Bilirubin Total: 0.4 mg/dL (ref 0.0–1.2)
CO2: 25 mmol/L (ref 20–29)
Calcium: 9.5 mg/dL (ref 8.7–10.2)
Chloride: 98 mmol/L (ref 96–106)
Creatinine, Ser: 1.33 mg/dL — ABNORMAL HIGH (ref 0.57–1.00)
Globulin, Total: 2.5 g/dL (ref 1.5–4.5)
Glucose: 124 mg/dL — ABNORMAL HIGH (ref 70–99)
Potassium: 4.4 mmol/L (ref 3.5–5.2)
Sodium: 137 mmol/L (ref 134–144)
Total Protein: 6.9 g/dL (ref 6.0–8.5)
eGFR: 47 mL/min/{1.73_m2} — ABNORMAL LOW (ref >59)

## 2023-07-01 LAB — TYPE AND SCREEN
ABO/RH(D): A POS
Antibody Screen: NEGATIVE

## 2023-07-01 LAB — CBC WITH DIFFERENTIAL/PLATELET
Basophils Absolute: 0.1 10*3/uL (ref 0.0–0.2)
Basos: 1 %
EOS (ABSOLUTE): 0.2 10*3/uL (ref 0.0–0.4)
Eos: 2 %
Hematocrit: 44 % (ref 34.0–46.6)
Hemoglobin: 14.6 g/dL (ref 11.1–15.9)
Immature Grans (Abs): 0 10*3/uL (ref 0.0–0.1)
Immature Granulocytes: 0 %
Lymphocytes Absolute: 2 10*3/uL (ref 0.7–3.1)
Lymphs: 21 %
MCH: 31.7 pg (ref 26.6–33.0)
MCHC: 33.2 g/dL (ref 31.5–35.7)
MCV: 95 fL (ref 79–97)
Monocytes Absolute: 0.5 10*3/uL (ref 0.1–0.9)
Monocytes: 5 %
Neutrophils Absolute: 6.7 10*3/uL (ref 1.4–7.0)
Neutrophils: 71 %
Platelets: 315 10*3/uL (ref 150–450)
RBC: 4.61 x10E6/uL (ref 3.77–5.28)
RDW: 13 % (ref 11.7–15.4)
WBC: 9.4 10*3/uL (ref 3.4–10.8)

## 2023-07-01 LAB — LIPID PANEL W/O CHOL/HDL RATIO
Cholesterol, Total: 221 mg/dL — ABNORMAL HIGH (ref 100–199)
HDL: 63 mg/dL (ref >39)
LDL Chol Calc (NIH): 120 mg/dL — ABNORMAL HIGH (ref 0–99)
Triglycerides: 220 mg/dL — ABNORMAL HIGH (ref 0–149)
VLDL Cholesterol Cal: 38 mg/dL (ref 5–40)

## 2023-07-01 LAB — TSH: TSH: 0.957 u[IU]/mL (ref 0.450–4.500)

## 2023-07-01 LAB — HEPATITIS C ANTIBODY: Hep C Virus Ab: NONREACTIVE

## 2023-07-01 LAB — HGB A1C W/O EAG: Hgb A1c MFr Bld: 6.1 % — ABNORMAL HIGH (ref 4.8–5.6)

## 2023-07-01 LAB — SURGICAL PCR SCREEN
MRSA, PCR: NEGATIVE
Staphylococcus aureus: NEGATIVE

## 2023-07-01 LAB — VITAMIN B12: Vitamin B-12: 303 pg/mL (ref 232–1245)

## 2023-07-01 LAB — VITAMIN D 25 HYDROXY (VIT D DEFICIENCY, FRACTURES): Vit D, 25-Hydroxy: 40.4 ng/mL (ref 30.0–100.0)

## 2023-07-01 NOTE — Progress Notes (Signed)
Device orders received. Email sent to Berna Bue with [employer] for a rep to be present day of surgery

## 2023-07-01 NOTE — Progress Notes (Signed)
PCP - Dr. Olevia Perches Cardiologist - Dr. Loman [place]  PPM/ICD - [employer] PPM Device Orders - received  Rep Notified - Whitney Post and Shari Prows notified via email that a rep will need to be present on DOS  Chest x-ray - 01/08/21 EKG - 01/01/23 Stress Test - denies ECHO - 01/11/18 Cardiac Cath - denies  Sleep Study - denies   DM- denies  ASA/Blood Thinner Instructions: n/a   ERAS Protcol - no, NPO   COVID TEST- n/a   Anesthesia review: yes, [employer] PPM, cardiac hx  Patient denies shortness of breath, fever, cough and chest pain at PAT appointment   All instructions explained to the patient, with a verbal understanding of the material. Patient agrees to go over the instructions while at home for a better understanding. The opportunity to ask questions was provided.

## 2023-07-02 ENCOUNTER — Encounter: Payer: Commercial Managed Care - PPO | Admitting: Physical Therapy

## 2023-07-02 NOTE — Anesthesia Preprocedure Evaluation (Addendum)
Anesthesia Evaluation  Patient identified by MRN, date of birth, ID band Patient awake    Reviewed: Allergy & Precautions, NPO status , Patient's Chart, lab work & pertinent test results  History of Anesthesia Complications (+) PONV and history of anesthetic complications  Airway Mallampati: II  TM Distance: >3 FB Neck ROM: Full    Dental  (+) Teeth Intact, Dental Advisory Given   Pulmonary asthma    breath sounds clear to auscultation       Cardiovascular + pacemaker  Rhythm:Regular Rate:Normal     Neuro/Psych  Headaches PSYCHIATRIC DISORDERS Anxiety Depression       GI/Hepatic negative GI ROS, Neg liver ROS,,,  Endo/Other  negative endocrine ROS    Renal/GU Renal disease     Musculoskeletal  (+) Arthritis ,    Abdominal   Peds  Hematology negative hematology ROS (+)   Anesthesia Other Findings   Reproductive/Obstetrics                             Anesthesia Physical Anesthesia Plan  ASA: 2  Anesthesia Plan: General   Post-op Pain Management: Tylenol PO (pre-op)*   Induction: Intravenous  PONV Risk Score and Plan: 4 or greater and Ondansetron, Dexamethasone, Midazolam and Scopolamine patch - Pre-op  Airway Management Planned: Oral ETT  Additional Equipment: None  Intra-op Plan:   Post-operative Plan: Extubation in OR  Informed Consent: I have reviewed the patients History and Physical, chart, labs and discussed the procedure including the risks, benefits and alternatives for the proposed anesthesia with the patient or authorized representative who has indicated his/her understanding and acceptance.     Dental advisory given  Plan Discussed with: CRNA  Anesthesia Plan Comments: (PAT note by Antionette Poles, PA-C:  57 year old female with pertinent history including post-operative N/V, neurocardiogenic syncope  (s/p explantation of old PPM and implantation of Medtronic  Adapta L ADDRL1 PPM 12/19/09), asthma, CKD (stage 3), right hemi-thyroidectomy, psoriatic arthritis (maintained on Tremfya), anxiety.  Last follow-up with EP cardiologist Dr. Elberta Fortis on 01/01/2023.  At that time she was cleared to undergo lumbar fusion.  Per note, "Preoperative evaluation: Has plans for L3/4 lumbar fusion.  She has no symptoms that suggest to cardiac issues.  No chest pain or shortness of breath.  Can work up to 4+ METS of activity.  She would be at low to intermediate risk for this intermediate risk procedure.  No further cardiac testing is necessary."  Patient did subsequently have surgery 01/09/23 without complication.  Preop labs reviewed, creatinine mildly elevated 1.33 c/w hx of CKD, otherwise unremarkable.   EKG 01/01/23: NSR.  Rate 65.  Perioperative prescription for implanted cardiac device programming per progress note 06/30/2023: Device Information:  Clinic EP Physician:  Loman Brooklyn, MD   Device Type:  Pacemaker Manufacturer and Phone #:  Medtronic: [phone] Pacemaker Dependent?:  No. Date of Last Device Check:  05/28/2023     Normal Device Function?:  Yes.    Electrophysiologist's Recommendations:   Have magnet available.  Provide continuous ECG monitoring when magnet is used or reprogramming is to be performed.   Procedure will likely interfere with device function.  Device should be programmed:  Asynchronous pacing during procedure and returned to normal programming after procedure  Long term monitor 08/08/19 -08/18/19: Max 162 bpm 12:06am, 11/26 Min 30 bpm 04:28am, 11/20 Avg 67 bpm Rare PACs and PVCs Predominant rhythm sinus rhythm Intermittent atrial and ventricular pacing Symptoms associated with sinus  rhythm  Echo 01/11/18: Study Conclusions  - Left ventricle: The cavity size was normal. Systolic function was  normal. The estimated ejection fraction was in the range of 60%  to 65%. Wall motion was normal; there were no regional  wall  motion abnormalities. Left ventricular diastolic function  parameters were normal.  - Left atrium: The atrium was normal in size.  - Right ventricle: Pacer wire or catheter noted in right ventricle.  Systolic function was normal.  - Pulmonary arteries: Systolic pressure was within the normal  range.   )        Anesthesia Quick Evaluation

## 2023-07-02 NOTE — Progress Notes (Signed)
Anesthesia Chart Review:  57 year old female with pertinent history including post-operative N/V, neurocardiogenic syncope  (s/p explantation of old PPM and implantation of Medtronic Adapta L ADDRL1 PPM 12/19/09), asthma, CKD (stage 3), right hemi-thyroidectomy, psoriatic arthritis (maintained on Tremfya), anxiety.  Last follow-up with EP cardiologist Dr. Elberta Fortis on 01/01/2023.  At that time she was cleared to undergo lumbar fusion.  Per note, "Preoperative evaluation: Has plans for L3/4 lumbar fusion.  She has no symptoms that suggest to cardiac issues.  No chest pain or shortness of breath.  Can work up to 4+ METS of activity.  She would be at low to intermediate risk for this intermediate risk procedure.  No further cardiac testing is necessary."  Patient did subsequently have surgery 01/09/23 without complication.  Preop labs reviewed, creatinine mildly elevated 1.33 c/w hx of CKD, otherwise unremarkable.   EKG 01/01/23: NSR.  Rate 65.  Perioperative prescription for implanted cardiac device programming per progress note 06/30/2023: Device Information:   Clinic EP Physician:  Loman Brooklyn, MD    Device Type:  Pacemaker Manufacturer and Phone #:  Medtronic: [phone] Pacemaker Dependent?:  No. Date of Last Device Check:  05/28/2023     Normal Device Function?:  Yes.     Electrophysiologist's Recommendations:   Have magnet available. Provide continuous ECG monitoring when magnet is used or reprogramming is to be performed.  Procedure will likely interfere with device function.  Device should be programmed:  Asynchronous pacing during procedure and returned to normal programming after procedure  Long term monitor 08/08/19 -08/18/19: Max 162 bpm 12:06am, 11/26 Min 30 bpm 04:28am, 11/20 Avg 67 bpm Rare PACs and PVCs Predominant rhythm sinus rhythm Intermittent atrial and ventricular pacing Symptoms associated with sinus rhythm   Echo 01/11/18: Study Conclusions  - Left ventricle:  The cavity size was normal. Systolic function was    normal. The estimated ejection fraction was in the range of 60%    to 65%. Wall motion was normal; there were no regional wall    motion abnormalities. Left ventricular diastolic function    parameters were normal.  - Left atrium: The atrium was normal in size.  - Right ventricle: Pacer wire or catheter noted in right ventricle.    Systolic function was normal.  - Pulmonary arteries: Systolic pressure was within the normal    range.       Zannie Cove [facility] Short Stay Center/Anesthesiology Phone [phone] 07/02/2023 2:10 PM

## 2023-07-04 NOTE — Assessment & Plan Note (Signed)
Rechecking labs today. Await results. Treat as needed.  °

## 2023-07-04 NOTE — Assessment & Plan Note (Signed)
Not under great control. Will start sertraline and recheck in about a month. Call with any concerns.

## 2023-07-04 NOTE — Assessment & Plan Note (Signed)
Labs drawn today. Await results. Treat as needed.

## 2023-07-04 NOTE — Assessment & Plan Note (Signed)
Under good control on current regimen. Continue current regimen. Continue to monitor. Call with any concerns. Refills given. Labs drawn today.   

## 2023-07-05 ENCOUNTER — Encounter: Payer: Self-pay | Admitting: Family Medicine

## 2023-07-05 ENCOUNTER — Other Ambulatory Visit: Payer: Self-pay | Admitting: Family Medicine

## 2023-07-05 DIAGNOSIS — N289 Disorder of kidney and ureter, unspecified: Secondary | ICD-10-CM

## 2023-07-06 ENCOUNTER — Other Ambulatory Visit (HOSPITAL_COMMUNITY): Payer: Self-pay

## 2023-07-06 ENCOUNTER — Other Ambulatory Visit (HOSPITAL_COMMUNITY): Payer: Self-pay | Admitting: Pharmacy Technician

## 2023-07-06 ENCOUNTER — Other Ambulatory Visit: Payer: Self-pay

## 2023-07-06 NOTE — Progress Notes (Signed)
Specialty Pharmacy Ongoing Clinical Assessment Note  Emma Rogers is a 57 y.o. female who is being followed by the specialty pharmacy service for RxSp Rheumatoid Arthritis   Patient's specialty medication(s) reviewed today: Guselkumab   Missed doses in the last 4 weeks: 0   Patient/Caregiver did not have any additional questions or concerns.   Therapeutic benefit summary: Patient is achieving benefit   Adverse events/side effects summary: No adverse events/side effects   Patient's therapy is appropriate to: Continue    Goals Addressed             This Visit's Progress    Reduce signs and symptoms       Patient is on track. Patient will maintain adherence         Follow up:  6 months  Otto Herb Specialty Pharmacist

## 2023-07-06 NOTE — Progress Notes (Signed)
Specialty Pharmacy Refill Coordination Note  Emma Rogers is a 57 y.o. female contacted today regarding refills of specialty medication(s) Guselkumab   Patient requested Delivery   Delivery date: 07/15/23   Verified address: [address] [place]   Medication will be filled on 07/14/23.

## 2023-07-07 ENCOUNTER — Encounter: Payer: Commercial Managed Care - PPO | Admitting: Physical Therapy

## 2023-07-07 ENCOUNTER — Other Ambulatory Visit: Payer: Commercial Managed Care - PPO

## 2023-07-07 DIAGNOSIS — N289 Disorder of kidney and ureter, unspecified: Secondary | ICD-10-CM

## 2023-07-08 ENCOUNTER — Encounter (HOSPITAL_COMMUNITY): Payer: Self-pay | Admitting: Neurosurgery

## 2023-07-08 ENCOUNTER — Ambulatory Visit (HOSPITAL_COMMUNITY): Payer: Commercial Managed Care - PPO | Admitting: Physician Assistant

## 2023-07-08 ENCOUNTER — Other Ambulatory Visit: Payer: Self-pay

## 2023-07-08 ENCOUNTER — Ambulatory Visit (HOSPITAL_COMMUNITY): Payer: Commercial Managed Care - PPO

## 2023-07-08 ENCOUNTER — Ambulatory Visit (HOSPITAL_COMMUNITY)
Admission: RE | Admit: 2023-07-08 | Discharge: 2023-07-10 | Disposition: A | Discharge status: Home / Self Care | Payer: Commercial Managed Care - PPO | Source: Ambulatory Visit | Attending: Neurosurgery | Admitting: Neurosurgery

## 2023-07-08 ENCOUNTER — Ambulatory Visit (HOSPITAL_BASED_OUTPATIENT_CLINIC_OR_DEPARTMENT_OTHER): Payer: Self-pay

## 2023-07-08 ENCOUNTER — Encounter (HOSPITAL_COMMUNITY)
Admission: RE | Disposition: A | Discharge status: Home / Self Care | Payer: Self-pay | Source: Ambulatory Visit | Attending: Neurosurgery

## 2023-07-08 DIAGNOSIS — F418 Other specified anxiety disorders: Secondary | ICD-10-CM | POA: Insufficient documentation

## 2023-07-08 DIAGNOSIS — J45909 Unspecified asthma, uncomplicated: Secondary | ICD-10-CM | POA: Diagnosis not present

## 2023-07-08 DIAGNOSIS — M51369 Other intervertebral disc degeneration, lumbar region without mention of lumbar back pain or lower extremity pain: Secondary | ICD-10-CM | POA: Insufficient documentation

## 2023-07-08 DIAGNOSIS — M1990 Unspecified osteoarthritis, unspecified site: Secondary | ICD-10-CM | POA: Diagnosis not present

## 2023-07-08 DIAGNOSIS — Z950 Presence of cardiac pacemaker: Secondary | ICD-10-CM | POA: Diagnosis not present

## 2023-07-08 DIAGNOSIS — Z4682 Encounter for fitting and adjustment of non-vascular catheter: Secondary | ICD-10-CM | POA: Diagnosis not present

## 2023-07-08 DIAGNOSIS — M48062 Spinal stenosis, lumbar region with neurogenic claudication: Secondary | ICD-10-CM

## 2023-07-08 DIAGNOSIS — N183 Chronic kidney disease, stage 3 unspecified: Secondary | ICD-10-CM | POA: Insufficient documentation

## 2023-07-08 DIAGNOSIS — M48061 Spinal stenosis, lumbar region without neurogenic claudication: Secondary | ICD-10-CM | POA: Diagnosis not present

## 2023-07-08 LAB — BASIC METABOLIC PANEL
BUN/Creatinine Ratio: 22 (ref 9–23)
BUN: 25 mg/dL — ABNORMAL HIGH (ref 6–24)
CO2: 23 mmol/L (ref 20–29)
Calcium: 9.2 mg/dL (ref 8.7–10.2)
Chloride: 101 mmol/L (ref 96–106)
Creatinine, Ser: 1.16 mg/dL — ABNORMAL HIGH (ref 0.57–1.00)
Glucose: 110 mg/dL — ABNORMAL HIGH (ref 70–99)
Potassium: 4.2 mmol/L (ref 3.5–5.2)
Sodium: 139 mmol/L (ref 134–144)
eGFR: 55 mL/min/{1.73_m2} — ABNORMAL LOW (ref >59)

## 2023-07-08 SURGERY — POSTERIOR LUMBAR FUSION 1 LEVEL
Anesthesia: General | Site: Back

## 2023-07-08 MED ORDER — ACETAMINOPHEN 160 MG/5ML PO SOLN: 325.0000 mg | Freq: Once | ORAL | Status: DC | PRN

## 2023-07-08 MED ORDER — SODIUM CHLORIDE 0.9% FLUSH
10.0000 mL | Freq: Two times a day (BID) | INTRAVENOUS | Status: DC
  Administered 2023-07-08 – 2023-07-09 (×3): 10 mL via INTRAVENOUS

## 2023-07-08 MED ORDER — BUPIVACAINE LIPOSOME 1.3 % IJ SUSP
INTRAMUSCULAR | Status: AC
  Filled 2023-07-08: qty 20

## 2023-07-08 MED ORDER — SODIUM CHLORIDE 0.9% FLUSH: 3.0000 mL | INTRAVENOUS | Status: DC | PRN

## 2023-07-08 MED ORDER — ONDANSETRON HCL 4 MG/2ML IJ SOLN
INTRAMUSCULAR | Status: DC | PRN
  Administered 2023-07-08: 4 mg via INTRAVENOUS

## 2023-07-08 MED ORDER — LIDOCAINE-EPINEPHRINE 1 %-1:100000 IJ SOLN
INTRAMUSCULAR | Status: DC | PRN
  Administered 2023-07-08: 10 mL

## 2023-07-08 MED ORDER — SENNOSIDES-DOCUSATE SODIUM 8.6-50 MG PO TABS
1.0000 | ORAL_TABLET | Freq: Two times a day (BID) | ORAL | Status: DC | PRN
  Administered 2023-07-08: 1 via ORAL
  Filled 2023-07-08: qty 1

## 2023-07-08 MED ORDER — BUPROPION HCL ER (XL) 150 MG PO TB24
450.0000 mg | ORAL_TABLET | Freq: Every day | ORAL | Status: DC
  Administered 2023-07-09: 450 mg via ORAL
  Filled 2023-07-08: qty 3

## 2023-07-08 MED ORDER — FENTANYL CITRATE (PF) 250 MCG/5ML IJ SOLN
INTRAMUSCULAR | Status: DC | PRN
  Administered 2023-07-08 (×2): 100 ug via INTRAVENOUS
  Administered 2023-07-08: 50 ug via INTRAVENOUS

## 2023-07-08 MED ORDER — HYDROMORPHONE HCL 1 MG/ML IJ SOLN
INTRAMUSCULAR | Status: AC
  Administered 2023-07-08: 0.5 mg via INTRAVENOUS
  Filled 2023-07-08: qty 1

## 2023-07-08 MED ORDER — BUPROPION HCL ER (XL) 300 MG PO TB24: 300.0000 mg | ORAL_TABLET | Freq: Every day | ORAL | Status: DC

## 2023-07-08 MED ORDER — ALUM & MAG HYDROXIDE-SIMETH 200-200-20 MG/5ML PO SUSP: 30.0000 mL | Freq: Four times a day (QID) | ORAL | Status: DC | PRN

## 2023-07-08 MED ORDER — ATORVASTATIN CALCIUM 40 MG PO TABS
40.0000 mg | ORAL_TABLET | Freq: Every day | ORAL | Status: DC
  Administered 2023-07-08 – 2023-07-09 (×2): 40 mg via ORAL
  Filled 2023-07-08 (×2): qty 1

## 2023-07-08 MED ORDER — ALBUTEROL SULFATE HFA 108 (90 BASE) MCG/ACT IN AERS: 2.0000 | INHALATION_SPRAY | Freq: Four times a day (QID) | RESPIRATORY_TRACT | Status: DC | PRN

## 2023-07-08 MED ORDER — PANTOPRAZOLE SODIUM 40 MG IV SOLR: 40.0000 mg | Freq: Every day | INTRAVENOUS | Status: DC

## 2023-07-08 MED ORDER — METHOCARBAMOL 500 MG PO TABS
500.0000 mg | ORAL_TABLET | Freq: Four times a day (QID) | ORAL | Status: DC | PRN
  Administered 2023-07-08 – 2023-07-10 (×5): 500 mg via ORAL
  Filled 2023-07-08 (×7): qty 1

## 2023-07-08 MED ORDER — CHLORHEXIDINE GLUCONATE 0.12 % MT SOLN
15.0000 mL | Freq: Once | OROMUCOSAL | Status: AC
  Administered 2023-07-08: 15 mL via OROMUCOSAL
  Filled 2023-07-08: qty 15

## 2023-07-08 MED ORDER — TRAMADOL HCL 50 MG PO TABS
50.0000 mg | ORAL_TABLET | Freq: Four times a day (QID) | ORAL | Status: DC | PRN
  Filled 2023-07-08: qty 1

## 2023-07-08 MED ORDER — ACETAMINOPHEN 10 MG/ML IV SOLN: 1000.0000 mg | Freq: Once | INTRAVENOUS | Status: DC | PRN

## 2023-07-08 MED ORDER — ONDANSETRON HCL 4 MG/2ML IJ SOLN: 4.0000 mg | Freq: Four times a day (QID) | INTRAMUSCULAR | Status: DC | PRN

## 2023-07-08 MED ORDER — HYDROMORPHONE HCL 1 MG/ML IJ SOLN
0.2500 mg | INTRAMUSCULAR | Status: DC | PRN
  Administered 2023-07-08: 0.5 mg via INTRAVENOUS

## 2023-07-08 MED ORDER — ALBUMIN HUMAN 5 % IV SOLN
INTRAVENOUS | Status: DC | PRN
Start: 2023-07-08 — End: 2023-07-08

## 2023-07-08 MED ORDER — CHLORHEXIDINE GLUCONATE CLOTH 2 % EX PADS: 6.0000 | MEDICATED_PAD | Freq: Once | CUTANEOUS | Status: DC

## 2023-07-08 MED ORDER — FENTANYL CITRATE (PF) 250 MCG/5ML IJ SOLN
INTRAMUSCULAR | Status: AC
  Filled 2023-07-08: qty 5

## 2023-07-08 MED ORDER — BUPIVACAINE LIPOSOME 1.3 % IJ SUSP
INTRAMUSCULAR | Status: DC | PRN
  Administered 2023-07-08: 20 mL

## 2023-07-08 MED ORDER — HYDROCODONE-ACETAMINOPHEN 5-325 MG PO TABS
2.0000 | ORAL_TABLET | ORAL | Status: DC | PRN
  Administered 2023-07-08 (×2): 2 via ORAL
  Filled 2023-07-08 (×2): qty 2

## 2023-07-08 MED ORDER — HYDROMORPHONE HCL 1 MG/ML IJ SOLN
0.5000 mg | INTRAMUSCULAR | Status: DC | PRN
  Administered 2023-07-08 – 2023-07-09 (×2): 0.5 mg via INTRAVENOUS
  Filled 2023-07-08 (×2): qty 0.5

## 2023-07-08 MED ORDER — CEFAZOLIN SODIUM-DEXTROSE 2-3 GM-%(50ML) IV SOLR
INTRAVENOUS | Status: DC | PRN
Start: 2023-07-08 — End: 2023-07-08
  Administered 2023-07-08: 2 g via INTRAVENOUS

## 2023-07-08 MED ORDER — ACETAMINOPHEN 650 MG RE SUPP: 650.0000 mg | RECTAL | Status: DC | PRN

## 2023-07-08 MED ORDER — GABAPENTIN 300 MG PO CAPS
300.0000 mg | ORAL_CAPSULE | Freq: Every day | ORAL | Status: DC
  Administered 2023-07-08 – 2023-07-09 (×2): 300 mg via ORAL
  Filled 2023-07-08 (×2): qty 1

## 2023-07-08 MED ORDER — SUGAMMADEX SODIUM 200 MG/2ML IV SOLN
INTRAVENOUS | Status: DC | PRN
  Administered 2023-07-08: 200 mg via INTRAVENOUS

## 2023-07-08 MED ORDER — BUPIVACAINE HCL (PF) 0.25 % IJ SOLN
INTRAMUSCULAR | Status: AC
  Filled 2023-07-08: qty 30

## 2023-07-08 MED ORDER — ONDANSETRON HCL 4 MG PO TABS: 4.0000 mg | ORAL_TABLET | Freq: Four times a day (QID) | ORAL | Status: DC | PRN

## 2023-07-08 MED ORDER — DEXAMETHASONE SODIUM PHOSPHATE 10 MG/ML IJ SOLN
INTRAMUSCULAR | Status: DC | PRN
  Administered 2023-07-08: 4 mg via INTRAVENOUS

## 2023-07-08 MED ORDER — LIDOCAINE-EPINEPHRINE 1 %-1:100000 IJ SOLN
INTRAMUSCULAR | Status: AC
  Filled 2023-07-08: qty 1

## 2023-07-08 MED ORDER — ACETAMINOPHEN 500 MG PO TABS
1000.0000 mg | ORAL_TABLET | Freq: Once | ORAL | Status: AC
  Administered 2023-07-08: 1000 mg via ORAL
  Filled 2023-07-08: qty 2

## 2023-07-08 MED ORDER — SERTRALINE HCL 50 MG PO TABS
50.0000 mg | ORAL_TABLET | Freq: Every day | ORAL | Status: DC
  Administered 2023-07-08 – 2023-07-09 (×2): 50 mg via ORAL
  Filled 2023-07-08 (×3): qty 1

## 2023-07-08 MED ORDER — PHENYLEPHRINE HCL-NACL 20-0.9 MG/250ML-% IV SOLN
INTRAVENOUS | Status: DC | PRN
  Administered 2023-07-08: 30 ug/min via INTRAVENOUS

## 2023-07-08 MED ORDER — MOMETASONE FURO-FORMOTEROL FUM 100-5 MCG/ACT IN AERO: 2.0000 | INHALATION_SPRAY | Freq: Two times a day (BID) | RESPIRATORY_TRACT | Status: DC

## 2023-07-08 MED ORDER — PHENYLEPHRINE 80 MCG/ML (10ML) SYRINGE FOR IV PUSH (FOR BLOOD PRESSURE SUPPORT)
PREFILLED_SYRINGE | INTRAVENOUS | Status: DC | PRN
  Administered 2023-07-08 (×2): 160 ug via INTRAVENOUS

## 2023-07-08 MED ORDER — PROPOFOL 10 MG/ML IV BOLUS
INTRAVENOUS | Status: DC | PRN
  Administered 2023-07-08: 150 mg via INTRAVENOUS
  Administered 2023-07-08: 25 mg via INTRAVENOUS

## 2023-07-08 MED ORDER — TRAZODONE HCL 50 MG PO TABS
200.0000 mg | ORAL_TABLET | Freq: Every day | ORAL | Status: DC
  Administered 2023-07-08 – 2023-07-09 (×2): 200 mg via ORAL
  Filled 2023-07-08 (×2): qty 1

## 2023-07-08 MED ORDER — SCOPOLAMINE 1 MG/3DAYS TD PT72
1.0000 | MEDICATED_PATCH | TRANSDERMAL | Status: DC
  Administered 2023-07-08: 1.5 mg via TRANSDERMAL
  Filled 2023-07-08: qty 1

## 2023-07-08 MED ORDER — PROPOFOL 10 MG/ML IV BOLUS
INTRAVENOUS | Status: AC
  Filled 2023-07-08: qty 20

## 2023-07-08 MED ORDER — VANCOMYCIN HCL IN DEXTROSE 1-5 GM/200ML-% IV SOLN
1000.0000 mg | INTRAVENOUS | Status: AC
  Administered 2023-07-08: 1000 mg via INTRAVENOUS
  Filled 2023-07-08: qty 200

## 2023-07-08 MED ORDER — LACTATED RINGERS IV SOLN
INTRAVENOUS | Status: DC | PRN
Start: 2023-07-08 — End: 2023-07-08

## 2023-07-08 MED ORDER — 0.9 % SODIUM CHLORIDE (POUR BTL) OPTIME
TOPICAL | Status: DC | PRN
  Administered 2023-07-08 (×2): 1000 mL

## 2023-07-08 MED ORDER — FUROSEMIDE 20 MG PO TABS: 20.0000 mg | ORAL_TABLET | Freq: Every day | ORAL | Status: DC | PRN

## 2023-07-08 MED ORDER — SODIUM CHLORIDE 0.9% FLUSH
3.0000 mL | Freq: Two times a day (BID) | INTRAVENOUS | Status: DC
  Administered 2023-07-09: 3 mL via INTRAVENOUS

## 2023-07-08 MED ORDER — ORAL CARE MOUTH RINSE: 15.0000 mL | Freq: Once | OROMUCOSAL | Status: AC

## 2023-07-08 MED ORDER — THROMBIN 20000 UNITS EX SOLR
CUTANEOUS | Status: DC | PRN
  Administered 2023-07-08: 20 mL via TOPICAL

## 2023-07-08 MED ORDER — MIDAZOLAM HCL 2 MG/2ML IJ SOLN
INTRAMUSCULAR | Status: DC | PRN
  Administered 2023-07-08: 2 mg via INTRAVENOUS

## 2023-07-08 MED ORDER — ROCURONIUM BROMIDE 10 MG/ML (PF) SYRINGE
PREFILLED_SYRINGE | INTRAVENOUS | Status: DC | PRN
  Administered 2023-07-08: 30 mg via INTRAVENOUS
  Administered 2023-07-08: 40 mg via INTRAVENOUS
  Administered 2023-07-08: 10 mg via INTRAVENOUS
  Administered 2023-07-08: 60 mg via INTRAVENOUS

## 2023-07-08 MED ORDER — CEFAZOLIN SODIUM-DEXTROSE 2-4 GM/100ML-% IV SOLN
2.0000 g | Freq: Three times a day (TID) | INTRAVENOUS | Status: DC
  Administered 2023-07-08 – 2023-07-09 (×3): 2 g via INTRAVENOUS
  Filled 2023-07-08 (×3): qty 100

## 2023-07-08 MED ORDER — PHENOL 1.4 % MT LIQD: 1.0000 | OROMUCOSAL | Status: DC | PRN

## 2023-07-08 MED ORDER — MEPERIDINE HCL 25 MG/ML IJ SOLN: 6.2500 mg | INTRAMUSCULAR | Status: DC | PRN

## 2023-07-08 MED ORDER — HYDROMORPHONE HCL 1 MG/ML IJ SOLN
INTRAMUSCULAR | Status: DC | PRN
Start: 2023-07-08 — End: 2023-07-08
  Administered 2023-07-08: .5 mg via INTRAVENOUS

## 2023-07-08 MED ORDER — MIDAZOLAM HCL 2 MG/2ML IJ SOLN
INTRAMUSCULAR | Status: AC
  Filled 2023-07-08: qty 2

## 2023-07-08 MED ORDER — DROPERIDOL 2.5 MG/ML IJ SOLN: 0.6250 mg | Freq: Once | INTRAMUSCULAR | Status: DC | PRN

## 2023-07-08 MED ORDER — ACETAMINOPHEN 325 MG PO TABS: 325.0000 mg | ORAL_TABLET | Freq: Once | ORAL | Status: DC | PRN

## 2023-07-08 MED ORDER — PANTOPRAZOLE SODIUM 40 MG PO TBEC
40.0000 mg | DELAYED_RELEASE_TABLET | Freq: Every day | ORAL | Status: DC
  Administered 2023-07-08 – 2023-07-09 (×2): 40 mg via ORAL
  Filled 2023-07-08 (×2): qty 1

## 2023-07-08 MED ORDER — HYDROMORPHONE HCL 1 MG/ML IJ SOLN
INTRAMUSCULAR | Status: AC
  Filled 2023-07-08: qty 0.5

## 2023-07-08 MED ORDER — THROMBIN 20000 UNITS EX SOLR
CUTANEOUS | Status: AC
  Filled 2023-07-08: qty 20000

## 2023-07-08 MED ORDER — ACETAMINOPHEN 10 MG/ML IV SOLN
INTRAVENOUS | Status: AC
  Filled 2023-07-08: qty 100

## 2023-07-08 MED ORDER — MENTHOL 3 MG MT LOZG: 1.0000 | LOZENGE | OROMUCOSAL | Status: DC | PRN

## 2023-07-08 MED ORDER — ACETAMINOPHEN 325 MG PO TABS
650.0000 mg | ORAL_TABLET | ORAL | Status: DC | PRN
  Filled 2023-07-08: qty 2

## 2023-07-08 SURGICAL SUPPLY — 74 items
ADH SKN CLS APL DERMABOND .7 (GAUZE/BANDAGES/DRESSINGS) ×1
APL SKNCLS STERI-STRIP NONHPOA (GAUZE/BANDAGES/DRESSINGS)
BAG COUNTER SPONGE SURGICOUNT (BAG) ×2 IMPLANT
BAG SPNG CNTER NS LX DISP (BAG) ×1
BASKET BONE COLLECTION (BASKET) ×2 IMPLANT
BENZOIN TINCTURE PRP APPL 2/3 (GAUZE/BANDAGES/DRESSINGS) ×2 IMPLANT
BLADE BONE MILL MEDIUM (MISCELLANEOUS) ×2 IMPLANT
BLADE CLIPPER SURG (BLADE) IMPLANT
BLADE SURG 11 STRL SS (BLADE) ×2 IMPLANT
BONE VIVIGEN FORMABLE 5.4CC (Bone Implant) ×1 IMPLANT
BUR CUTTER 7.0 ROUND (BURR) ×2 IMPLANT
BUR MATCHSTICK NEURO 3.0 LAGG (BURR) ×2 IMPLANT
CANISTER SUCT 3000ML PPV (MISCELLANEOUS) ×2 IMPLANT
CAP LOCKING THREADED (Cap) IMPLANT
CNTNR URN SCR LID CUP LEK RST (MISCELLANEOUS) ×2 IMPLANT
CONT SPEC 4OZ STRL OR WHT (MISCELLANEOUS) ×1
COVER BACK TABLE 60X90IN (DRAPES) ×2 IMPLANT
DERMABOND ADVANCED .7 DNX12 (GAUZE/BANDAGES/DRESSINGS) ×2 IMPLANT
DRAPE C-ARM 42X72 X-RAY (DRAPES) ×4 IMPLANT
DRAPE C-ARMOR (DRAPES) IMPLANT
DRAPE HALF SHEET 40X57 (DRAPES) IMPLANT
DRAPE LAPAROTOMY 100X72X124 (DRAPES) ×2 IMPLANT
DRAPE SURG 17X23 STRL (DRAPES) ×2 IMPLANT
DRSG OPSITE 4X5.5 SM (GAUZE/BANDAGES/DRESSINGS) ×2 IMPLANT
DRSG OPSITE POSTOP 4X6 (GAUZE/BANDAGES/DRESSINGS) ×2 IMPLANT
DRSG OPSITE POSTOP 4X8 (GAUZE/BANDAGES/DRESSINGS) IMPLANT
DURAPREP 26ML APPLICATOR (WOUND CARE) ×2 IMPLANT
ELECT REM PT RETURN 9FT ADLT (ELECTROSURGICAL) ×1
ELECTRODE REM PT RTRN 9FT ADLT (ELECTROSURGICAL) ×2 IMPLANT
EVACUATOR 1/8 PVC DRAIN (DRAIN) IMPLANT
EVACUATOR 3/16 PVC DRAIN (DRAIN) IMPLANT
GAUZE 4X4 16PLY ~~LOC~~+RFID DBL (SPONGE) IMPLANT
GAUZE SPONGE 4X4 12PLY STRL (GAUZE/BANDAGES/DRESSINGS) ×2 IMPLANT
GLOVE BIO SURGEON STRL SZ7 (GLOVE) IMPLANT
GLOVE BIO SURGEON STRL SZ8 (GLOVE) ×4 IMPLANT
GLOVE BIOGEL PI IND STRL 7.0 (GLOVE) IMPLANT
GLOVE BIOGEL PI IND STRL 7.5 (GLOVE) IMPLANT
GLOVE EXAM NITRILE XL STR (GLOVE) IMPLANT
GLOVE INDICATOR 8.5 STRL (GLOVE) ×4 IMPLANT
GOWN STRL REUS W/ TWL LRG LVL3 (GOWN DISPOSABLE) IMPLANT
GOWN STRL REUS W/ TWL XL LVL3 (GOWN DISPOSABLE) ×4 IMPLANT
GOWN STRL REUS W/TWL 2XL LVL3 (GOWN DISPOSABLE) IMPLANT
GOWN STRL REUS W/TWL LRG LVL3 (GOWN DISPOSABLE) ×1
GOWN STRL REUS W/TWL XL LVL3 (GOWN DISPOSABLE) ×3
GRAFT BNE MATRIX VG FRMBL MD 5 (Bone Implant) IMPLANT
KIT BASIN OR (CUSTOM PROCEDURE TRAY) ×2 IMPLANT
KIT GRAFTMAG DEL NEURO DISP (NEUROSURGERY SUPPLIES) IMPLANT
KIT TURNOVER KIT B (KITS) ×2 IMPLANT
MILL BONE PREP (MISCELLANEOUS) ×2 IMPLANT
NDL HYPO 21X1.5 SAFETY (NEEDLE) IMPLANT
NDL HYPO 25X1 1.5 SAFETY (NEEDLE) ×2 IMPLANT
NEEDLE HYPO 21X1.5 SAFETY (NEEDLE) ×1
NEEDLE HYPO 25X1 1.5 SAFETY (NEEDLE) ×1
NS IRRIG 1000ML POUR BTL (IV SOLUTION) ×2 IMPLANT
PACK LAMINECTOMY NEURO (CUSTOM PROCEDURE TRAY) ×2 IMPLANT
PAD ARMBOARD 7.5X6 YLW CONV (MISCELLANEOUS) ×6 IMPLANT
ROD 100MM (Rod) ×2 IMPLANT
ROD SPNL STRG 100X5.5XNS (Rod) IMPLANT
SCREW MOD 6.0-5.0X35MM (Screw) IMPLANT
SCREW PA THRD CREO TULIP 5.5X4 (Head) IMPLANT
SPACER SUSTAIN TI 10X22 13 15D (Spacer) IMPLANT
SPIKE FLUID TRANSFER (MISCELLANEOUS) ×2 IMPLANT
SPONGE SURGIFOAM ABS GEL 100 (HEMOSTASIS) ×2 IMPLANT
SPONGE T-LAP 4X18 ~~LOC~~+RFID (SPONGE) IMPLANT
STRIP CLOSURE SKIN 1/2X4 (GAUZE/BANDAGES/DRESSINGS) ×4 IMPLANT
SUT VIC AB 0 CT1 18XCR BRD8 (SUTURE) ×4 IMPLANT
SUT VIC AB 0 CT1 8-18 (SUTURE) ×2
SUT VIC AB 2-0 CT1 18 (SUTURE) ×2 IMPLANT
SUT VIC AB 4-0 PS2 27 (SUTURE) ×2 IMPLANT
SYR 30ML LL (SYRINGE) IMPLANT
TOWEL GREEN STERILE (TOWEL DISPOSABLE) ×2 IMPLANT
TOWEL GREEN STERILE FF (TOWEL DISPOSABLE) ×2 IMPLANT
TRAY FOLEY MTR SLVR 16FR STAT (SET/KITS/TRAYS/PACK) ×2 IMPLANT
WATER STERILE IRR 1000ML POUR (IV SOLUTION) ×2 IMPLANT

## 2023-07-08 NOTE — Op Note (Signed)
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  Postoperative diagnosis: Same.  Procedure: #1 decompressive laminectomy L2-3 with complete medial facetectomies radical foraminotomies of the L2 and L3 nerve roots with redo foraminotomies of the L3 neuroforamen in excess and requiring more work than would be needed with a standard interbody fusion.  2.  Posterior lumbar interbody fusion L2-3 utilizing the globus insert and rotate titanium cages packed with locally harvested autograft mixed with Vivigen.  3.  Cortical screw fixation L2-3 utilizing the globus Creo amp modular cortical screw set and the globus addition set marrying up to the patient's previous construct from L4-S1 which was believed to be an 10 Medtronic.  4.  Posterolateral arthrodesis L2-3 utilizing locally harvested autograft mixed with Vivigen.  Surgeon: Donalee Citrin.  Assistant: Julien Girt.  Anesthesia: General.  EBL: Minimal.  HPI: 57 year old female who several months ago underwent interbody fusion L3-4 and did well initially however then saw experiencing worsening back and bilateral hip and leg pain and workup revealed segmental instability at L2-3 with motion of flexion extension and spinal stenosis.  Due to her progression of clinical syndrome at L2-3 failed conservative treatment imaging I have recommended extension of her fusion up to include L2-3.  I extensively went over the risks and benefits of the operation with her as well as perioperative course expectations of outcome and alternatives of surgery and she understood and agreed to proceed forward.Marland Kitchen  Operative procedure: Patient was brought into the OR was induced under general anesthesia positioned prone the Wilson frame her back was prepped and draped in routine sterile fashion her old incision was opened up and extended cephalad subperiosteal dissection was carried lamina of L1-L2 and I exposed the hardware from L3 down to its connector then I  remove the spinous process of L2 performed a central decompression with complete medial facetectomies extensive scar tissue and severe foraminal stenosis of the L3 nerve root was identified this was all freed up with redo foraminotomies of the L3 nerve root was obtained aggressive under biting the superior tickling facet gained access to the lateral margin of disc base and radical foraminotomies going through the pars interarticularis decompress the L2 nerve root at its exit.  I then cleaned out the disc base bilaterally prepared the endplates bilaterally there was a large amount of disc material centrally this was all removed after adequate endplate preparation utilizing sequential distraction and under fluoroscopy I selected 10 to 1315 degree 22 mm length cages inserted it after being packed with locally harvested autograft mixed with Vivigen and a packed and extensive mount of autograft mix centrally.  Then placed cortical screws at L2 all screws with excellent purchase then I aggressively copiously irrigated the wound postop imaging confirmed good position of all the implants I cortical decorticated the lateral facet joints TPs lateral residual pars and superior tickling facet as well as TP at L3 and packed extensive mount of autograft mix posterior laterally at L2-3.  I then assembled the heads contour the rod and inserted the rod through the connector and anchored in place of the L2 and L3 cortical screws.  I reinspected the foramina to confirm patency and no migration of graft material laid Gelfoam and top of the dura I then torqued down and tightened up all the connections compressing L2 against L3.  Placed a medium Hemovac drain injected Exparel in the fascia and closed the wound in layers with interrupted Vicryl and a running 4 subcuticular.  My assistant Cala Bradford was present and assisted  in all critical parts of the decompression and instrumentation.  The wound was then dressed with Dermabond benzoin  Steri-Strips and a sterile dressing and patient recovery in stable condition.  At the end the case all needle count sponge counts were correct.

## 2023-07-08 NOTE — Anesthesia Procedure Notes (Signed)
Procedure Name: Intubation Date/Time: 07/08/2023 8:58 AM  Performed by: Sandie Ano, CRNAPre-anesthesia Checklist: Patient identified, Emergency Drugs available, Suction available and Patient being monitored Patient Re-evaluated:Patient Re-evaluated prior to induction Oxygen Delivery Method: Circle System Utilized Preoxygenation: Pre-oxygenation with 100% oxygen Induction Type: IV induction Ventilation: Mask ventilation without difficulty Laryngoscope Size: Mac and 3 Grade View: Grade I Tube type: Oral Tube size: 7.0 mm Number of attempts: 1 Airway Equipment and Method: Stylet and Oral airway Placement Confirmation: ETT inserted through vocal cords under direct vision, positive ETCO2 and breath sounds checked- equal and bilateral Secured at: 22 cm Tube secured with: Tape Dental Injury: Teeth and Oropharynx as per pre-operative assessment

## 2023-07-08 NOTE — Progress Notes (Signed)
Dr. Hart Rochester requesting Medtronic place patient in asynchronous pacing per EP recommendations. Rep and CRNA made aware.

## 2023-07-08 NOTE — Progress Notes (Signed)
Called and spoke to [employer]- Ship broker to confirm that he has the patient on his schedule for today. Per Guiddel he will be at [facility] at 8 am to program the device.

## 2023-07-08 NOTE — H&P (Signed)
Emma Rogers is an 57 y.o. female.   Chief Complaint: Back and right greater than left leg pain HPI: 57 year old female previously undergone L3-4 fusion extension relatively recently and did well initially however over the last several weeks to months has developed progressive worsening back and right greater than left leg pain and workup revealed early and progressive segmental degeneration above her fusion at L2-3 with severe stenosis and instability.  Due to her progression of clinical syndrome imaging findings of a conservative treatment I recommended decompression and interbody fusion at L2-3 with extension of her construct.  Extensively reviewed the risks and benefits of the operation with her as well as perioperative course expectations of outcome and alternatives of surgery and she understood and agreed to proceed forward.  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    Shingles     Past Surgical History:  Procedure Laterality Date   BREAST BIOPSY Left 09/30/2002   Ductogram-neg   INSERT / REPLACE / REMOVE PACEMAKER  04/28/1995   Permanent pacemaker, Dr. [name] (medtronic)   Lobe hemithroidectomy     Right   LUMBAR FUSION  2004   w/ Dr. Wynetta Emery   LUMBAR LAMINECTOMY  1989   SPINAL FUSION  01/09/2023   L3 and L4, L5 and L6   TOTAL ABDOMINAL HYSTERECTOMY  2009   still has 1 ovary    Family History  Problem Relation Age of Onset   Rheum arthritis Mother    Asthma Mother    Heart disease  Father        pacemaker,arrythmias   Lung disease Father    COPD Maternal Grandmother    Breast cancer Maternal Grandmother    Heart disease Maternal Grandfather    Liver disease Paternal Grandmother    Heart disease Paternal Grandfather    Asthma Other    Coronary artery disease Other        Female 1st degree relative <50   Lung cancer Other    Prostate cancer Other        1st degree relative <50   Social History:  reports that she has never smoked. She has been exposed to tobacco smoke. She has never used smokeless tobacco. She reports that she does not drink alcohol and does not use drugs.  Allergies:  Allergies  Allergen Reactions   Augmentin [Amoxicillin-Pot Clavulanate] Diarrhea and Nausea And Vomiting    Mostly Nausea and Upset Stomach    Morphine And Codeine Nausea And Vomiting    vomiting    Medications Prior to Admission  Medication Sig Dispense Refill   atorvastatin (LIPITOR) 40 MG tablet Take 1 tablet (40 mg total) by mouth at bedtime. OFFICE VISIT NEEDED FOR ADDITIONAL REFILLS 90 tablet 0   buPROPion (WELLBUTRIN XL) 150 MG 24 hr tablet Take 1 tablet (150 mg total) by mouth daily. 90 tablet 0   buPROPion (WELLBUTRIN XL) 300 MG 24 hr tablet Take 1  tablet (300 mg total) by mouth daily. 90 tablet 0   furosemide (LASIX) 20 MG tablet Take 1 tablet (20 mg total) by mouth daily as needed. OFFICE VISIT NEEDED FOR ADDITIONAL REFILLS 90 tablet 0   gabapentin (NEURONTIN) 300 MG capsule Take 1 capsule (300 mg total) by mouth at bedtime. 90 capsule 0   methocarbamol (ROBAXIN) 500 MG tablet Take 1 tablet (500 mg total) by mouth 4 (four) times daily. (Patient taking differently: Take 500 mg by mouth 4 (four) times daily as needed for muscle spasms.) 45 tablet 0   rizatriptan (MAXALT) 10 MG tablet Take 1 tablet (10 mg total) by mouth as needed for migraine. May repeat in 2 hours if needed 10 tablet 2   sertraline (ZOLOFT) 50 MG tablet Take 1 tablet (50 mg total) by mouth daily. 90 tablet  0   traMADol (ULTRAM) 50 MG tablet Take 1 tablet (50 mg total) by mouth every 6 (six) hours as needed. 45 tablet 0   traZODone (DESYREL) 100 MG tablet Take 2 tablets (200 mg total) by mouth at bedtime. 180 tablet 0   albuterol (VENTOLIN HFA) 108 (90 Base) MCG/ACT inhaler Inhale 2 puffs into the lungs every 6 (six) hours as needed for wheezing or shortness of breath. 18 g 2   budesonide-formoterol (SYMBICORT) 80-4.5 MCG/ACT inhaler Inhale 2 puffs into the lungs 2 (two) times daily. (Patient not taking: Reported on 01/28/2023) 10.2 g 2   guselkumab (TREMFYA) 100 MG/ML pen Inject 100 mg into the skin every 8 weeks 1 mL 2    Results for orders placed or performed in visit on 07/07/23 (from the past 48 hour(s))  Basic metabolic panel     Status: Abnormal   Collection Time: 07/07/23  9:24 AM  Result Value Ref Range   Glucose 110 (H) 70 - 99 mg/dL   BUN 25 (H) 6 - 24 mg/dL   Creatinine, Ser 1.61 (H) 0.57 - 1.00 mg/dL   eGFR 55 (L) >09 UE/AVW/0.98   BUN/Creatinine Ratio 22 9 - 23   Sodium 139 134 - 144 mmol/L   Potassium 4.2 3.5 - 5.2 mmol/L   Chloride 101 96 - 106 mmol/L   CO2 23 20 - 29 mmol/L   Calcium 9.2 8.7 - 10.2 mg/dL   No results found.  Review of Systems  Musculoskeletal:  Positive for back pain.  Neurological:  Positive for numbness.    Blood pressure (!) 129/95, pulse 61, temperature 98.2 F (36.8 C), temperature source Oral, resp. rate 17, height 5' 4.5" (1.638 m), weight 96 kg, SpO2 97%. Physical Exam HENT:     Head: Normocephalic.     Right Ear: Tympanic membrane normal.     Nose: Nose normal.     Mouth/Throat:     Mouth: Mucous membranes are moist.  Eyes:     Pupils: Pupils are equal, round, and reactive to light.  Cardiovascular:     Rate and Rhythm: Normal rate.     Pulses: Normal pulses.  Pulmonary:     Effort: Pulmonary effort is normal.  Abdominal:     General: Abdomen is flat.  Musculoskeletal:     Cervical back: Normal range of motion.  Skin:     General: Skin is warm.  Neurological:     Mental Status: She is alert.     Comments: Strength is 5 out of 5 iliopsoas, quads, hamstrings, gastroc, into tibialis, and EHL.      Assessment/Plan 57 year old presents for decompression stabilization procedure L2-3  Mariam Dollar, MD 07/08/2023, 8:12 AM

## 2023-07-08 NOTE — Anesthesia Postprocedure Evaluation (Signed)
Anesthesia Post Note  Patient: [name], therapeutic  Procedure(s) Performed: Posterior Lumbar Interbody Fusion Lumbar Two - Lumbar Three, EXTEND FUSION (Back)     Patient location during evaluation: PACU Anesthesia Type: General Level of consciousness: awake and alert Pain management: pain level controlled Vital Signs Assessment: post-procedure vital signs reviewed and stable Respiratory status: spontaneous breathing, nonlabored ventilation, respiratory function stable and patient connected to nasal cannula oxygen Cardiovascular status: blood pressure returned to baseline and stable Postop Assessment: no apparent nausea or vomiting Anesthetic complications: no  No notable events documented.  Last Vitals:  Vitals:   07/08/23 1330 07/08/23 1516  BP: 99/65 108/61  Pulse: (!) 59 62  Resp: 20 16  Temp: 36.7 C 36.5 C  SpO2: 99% 97%    Last Pain:  Vitals:   07/08/23 1516  TempSrc: Oral  PainSc:                  Emma Rogers

## 2023-07-08 NOTE — Transfer of Care (Signed)
Immediate Anesthesia Transfer of Care Note  Patient: Makell Lehnert  Procedure(s) Performed: Posterior Lumbar Interbody Fusion Lumbar Two - Lumbar Three, EXTEND FUSION (Back)  Patient Location: PACU  Anesthesia Type:General  Level of Consciousness: awake and alert   Airway & Oxygen Therapy: Patient Spontanous Breathing  Post-op Assessment: Report given to RN and Post -op Vital signs reviewed and stable  Post vital signs: Reviewed and stable  Last Vitals:  Vitals Value Taken Time  BP 118/61 07/08/23 1149  Temp    Pulse 80 07/08/23 1152  Resp 13 07/08/23 1152  SpO2 90 % 07/08/23 1152  Vitals shown include unfiled device data.  Last Pain:  Vitals:   07/08/23 0656  TempSrc:   PainSc: 7       Patients Stated Pain Goal: 0 (07/08/23 0656)  Complications: No notable events documented.

## 2023-07-09 ENCOUNTER — Encounter: Payer: Commercial Managed Care - PPO | Admitting: Physical Therapy

## 2023-07-09 DIAGNOSIS — M1990 Unspecified osteoarthritis, unspecified site: Secondary | ICD-10-CM | POA: Diagnosis not present

## 2023-07-09 DIAGNOSIS — M48061 Spinal stenosis, lumbar region without neurogenic claudication: Secondary | ICD-10-CM | POA: Diagnosis not present

## 2023-07-09 DIAGNOSIS — J45909 Unspecified asthma, uncomplicated: Secondary | ICD-10-CM | POA: Diagnosis not present

## 2023-07-09 DIAGNOSIS — M51369 Other intervertebral disc degeneration, lumbar region without mention of lumbar back pain or lower extremity pain: Secondary | ICD-10-CM | POA: Diagnosis not present

## 2023-07-09 DIAGNOSIS — Z950 Presence of cardiac pacemaker: Secondary | ICD-10-CM | POA: Diagnosis not present

## 2023-07-09 DIAGNOSIS — F418 Other specified anxiety disorders: Secondary | ICD-10-CM | POA: Diagnosis not present

## 2023-07-09 DIAGNOSIS — N183 Chronic kidney disease, stage 3 unspecified: Secondary | ICD-10-CM | POA: Diagnosis not present

## 2023-07-09 MED ORDER — DEXAMETHASONE 4 MG PO TABS
6.0000 mg | ORAL_TABLET | Freq: Once | ORAL | Status: AC
  Administered 2023-07-09: 6 mg via ORAL
  Filled 2023-07-09: qty 2

## 2023-07-09 MED ORDER — GABAPENTIN 300 MG PO CAPS
300.0000 mg | ORAL_CAPSULE | Freq: Once | ORAL | Status: AC
  Administered 2023-07-09: 300 mg via ORAL
  Filled 2023-07-09: qty 1

## 2023-07-09 MED ORDER — CEPHALEXIN 500 MG PO CAPS
500.0000 mg | ORAL_CAPSULE | Freq: Three times a day (TID) | ORAL | Status: DC
  Administered 2023-07-09 – 2023-07-10 (×3): 500 mg via ORAL
  Filled 2023-07-09 (×3): qty 1

## 2023-07-09 MED ORDER — DEXAMETHASONE SODIUM PHOSPHATE 10 MG/ML IJ SOLN
10.0000 mg | Freq: Four times a day (QID) | INTRAMUSCULAR | Status: DC
  Administered 2023-07-09 (×2): 10 mg via INTRAVENOUS
  Filled 2023-07-09 (×2): qty 1

## 2023-07-09 MED ORDER — HYDROCODONE-ACETAMINOPHEN 5-325 MG PO TABS
1.0000 | ORAL_TABLET | ORAL | Status: DC | PRN
  Administered 2023-07-09 – 2023-07-10 (×8): 1 via ORAL
  Filled 2023-07-09: qty 1
  Filled 2023-07-09: qty 2
  Filled 2023-07-09 (×4): qty 1
  Filled 2023-07-09: qty 2
  Filled 2023-07-09 (×3): qty 1

## 2023-07-09 NOTE — Progress Notes (Signed)
Subjective: Patient reports patient with condition of back pain and left anterior quad pain that occasionally goes below her knee.  That pain starts in the anterior compartment of her hip does not radiate from her back.  Objective: Vital signs in last 24 hours: Temp:  [97.5 F (36.4 C)-98.7 F (37.1 C)] 98.6 F (37 C) (10/17 0252) Pulse Rate:  [59-81] 64 (10/17 0556) Resp:  [10-24] 16 (10/17 0252) BP: (86-118)/(36-73) 97/59 (10/17 0556) SpO2:  [92 %-99 %] 94 % (10/17 0252)  Intake/Output from previous day: 10/16 0701 - 10/17 0700 In: 2360 [P.O.:960; I.V.:900; IV Piggyback:500] Out: 840 [Urine:125; Drains:215; Blood:500] Intake/Output this shift: No intake/output data recorded.  Strength 5 out of 5 incision clean dry and intact  Lab Results: No results for input(s): "WBC", "HGB", "HCT", "PLT" in the last 72 hours. BMET Recent Labs    07/07/23 0924  NA 139  K 4.2  CL 101  CO2 23  GLUCOSE 110*  BUN 25*  CREATININE 1.16*  CALCIUM 9.2    Studies/Results: DG Lumbar Spine 2-3 Views  Result Date: 07/08/2023 CLINICAL DATA:  L2-L3 PLIF EXAM: LUMBAR SPINE - 2-3 VIEW; DG C-ARM 1-60 MIN-NO REPORT COMPARISON:  Lumbar spine CT 04/28/2023 FINDINGS: Two C-arm fluoroscopic images were obtained intraoperatively and submitted for post operative interpretation. The provided image demonstrates pedicle screws at what are presumed to be the L2, L3, and L4 levels with interbody spacers at L2-L3 and L3-L4. No interlocking rod is present at L2-L3 or L3-L4 at the time of imaging. Fluoro time 25.6 seconds, dose 20.9 mGy. Please see the performing provider's procedural report for further detail. IMPRESSION: Intraoperative images during lumbar spine fusion as above. Electronically Signed   By: Lesia Hausen M.D.   On: 07/08/2023 15:02   DG C-Arm 1-60 Min-No Report  Result Date: 07/08/2023 Fluoroscopy was utilized by the requesting physician.  No radiographic interpretation.     Assessment/Plan: Postop day 1 posterior lumbar interbody fusion L2-3 complete resolution of preoperative radicular pain on the right having some left anterior quad pain unclear whether this is radiculitis or possibly lateral from a cutaneous from positioning.  Will start Decadron and Neurontin and see how she mobilizes potentially could still go home this afternoon will see how she feels when she gets around with physical therapy throughout the day.  LOS: 0 days     Mariam Dollar 07/09/2023, 8:12 AM

## 2023-07-09 NOTE — Plan of Care (Signed)
°  Problem: Education: °Goal: Ability to verbalize activity precautions or restrictions will improve °Outcome: Completed/Met °Goal: Knowledge of the prescribed therapeutic regimen will improve °Outcome: Completed/Met °Goal: Understanding of discharge needs will improve °Outcome: Completed/Met °  °Problem: Activity: °Goal: Ability to avoid complications of mobility impairment will improve °Outcome: Completed/Met °Goal: Ability to tolerate increased activity will improve °Outcome: Completed/Met °Goal: Will remain free from falls °Outcome: Completed/Met °  °Problem: Bowel/Gastric: °Goal: Gastrointestinal status for postoperative course will improve °Outcome: Completed/Met °  °Problem: Clinical Measurements: °Goal: Ability to maintain clinical measurements within normal limits will improve °Outcome: Completed/Met °Goal: Postoperative complications will be avoided or minimized °Outcome: Completed/Met °Goal: Diagnostic test results will improve °Outcome: Completed/Met °  °Problem: Pain Management: °Goal: Pain level will decrease °Outcome: Completed/Met °  °Problem: Skin Integrity: °Goal: Will show signs of wound healing °Outcome: Completed/Met °  °Problem: Health Behavior/Discharge Planning: °Goal: Identification of resources available to assist in meeting health care needs will improve °Outcome: Completed/Met °  °Problem: Bladder/Genitourinary: °Goal: Urinary functional status for postoperative course will improve °Outcome: Completed/Met °  °

## 2023-07-09 NOTE — Evaluation (Signed)
Occupational Therapy Evaluation Patient Details Name: Emma Rogers MRN: [id number] DOB: 1966-01-26 Today's Date: 07/09/2023   History of Present Illness 57 yo F s/p PLIF.  PMH includes: anxiety, asthma, pacemaker, arthritis, prior back surgery.   Clinical Impression   Patient admitted for the diagnosis above.  PTA she lives at home with her spouse who can assist as needed.  Patient was recovering from a prior back surgery and has all needed DME.  Patient is close to her baseline with pain her primary deficit.  Patient is needing generalized supervision for mobility due to BP, 89/63, and Min A for lower body ADL.  OT can follow up next date to ensure compliance is she remains in the acute setting.  No post acute OT is anticipated.        If plan is discharge home, recommend the following: Assist for transportation    Functional Status Assessment  Patient has had a recent decline in their functional status and demonstrates the ability to make significant improvements in function in a reasonable and predictable amount of time.  Equipment Recommendations  None recommended by OT    Recommendations for Other Services       Precautions / Restrictions Precautions Precautions: Back Precaution Booklet Issued: Yes (comment) Restrictions Weight Bearing Restrictions: No      Mobility Bed Mobility Overal bed mobility: Modified Independent                  Transfers Overall transfer level: Needs assistance   Transfers: Sit to/from Stand Sit to Stand: Supervision                  Balance Overall balance assessment: Needs assistance Sitting-balance support: Feet supported Sitting balance-Leahy Scale: Good     Standing balance support: Reliant on assistive device for balance Standing balance-Leahy Scale: Fair                             ADL either performed or assessed with clinical judgement   ADL                       Lower Body Dressing:  Minimal assistance;Sit to/from stand   Toilet Transfer: Supervision/safety;Ambulation;Rolling walker (2 wheels)                   Vision Patient Visual Report: No change from baseline       Perception Perception: Within Functional Limits       Praxis Praxis: WFL       Pertinent Vitals/Pain Pain Assessment Pain Assessment: Faces Faces Pain Scale: Hurts even more Pain Location: Back Pain Descriptors / Indicators: Aching, Grimacing, Guarding Pain Intervention(s): Monitored during session, Premedicated before session     Extremity/Trunk Assessment Upper Extremity Assessment Upper Extremity Assessment: Overall WFL for tasks assessed   Lower Extremity Assessment Lower Extremity Assessment: Overall WFL for tasks assessed   Cervical / Trunk Assessment Cervical / Trunk Assessment: Back Surgery   Communication Communication Communication: No apparent difficulties   Cognition Arousal: Alert Behavior During Therapy: WFL for tasks assessed/performed Overall Cognitive Status: Within Functional Limits for tasks assessed                                       General Comments   Watch BP    Exercises     Shoulder Instructions  Home Living Family/patient expects to be discharged to:: Private residence Living Arrangements: Spouse/significant other Available Help at Discharge: Family;Available PRN/intermittently Type of Home: House Home Access: Stairs to enter [employer] of Steps: 4 Entrance Stairs-Rails: Left Home Layout: One level     Bathroom Shower/Tub: Chief Strategy Officer: Handicapped height Bathroom Accessibility: Yes How Accessible: Accessible via walker Home Equipment: Cane - single point;Shower Counsellor (2 wheels);Adaptive equipment Adaptive Equipment: Reacher        Prior Functioning/Environment Prior Level of Function : Independent/Modified Independent             Mobility Comments:  use of cane for mobility ADLs Comments: PRN assist        OT Problem List: Pain      OT Treatment/Interventions: Self-care/ADL training;Therapeutic activities    OT Goals(Current goals can be found in the care plan section) Acute Rehab OT Goals Patient Stated Goal: Return home OT Goal Formulation: With patient Time For Goal Achievement: 07/16/23 Potential to Achieve Goals: Good ADL Goals Pt Will Perform Lower Body Dressing: with modified independence;sit to/from stand;with adaptive equipment Pt Will Transfer to Toilet: with modified independence;regular height toilet;ambulating  OT Frequency: Min 1X/week    Co-evaluation              AM-PAC OT "6 Clicks" Daily Activity     Outcome Measure Help from another person eating meals?: None Help from another person taking care of personal grooming?: None Help from another person toileting, which includes using toliet, bedpan, or urinal?: A Little Help from another person bathing (including washing, rinsing, drying)?: A Little Help from another person to put on and taking off regular upper body clothing?: A Little Help from another person to put on and taking off regular lower body clothing?: A Little 6 Click Score: 20   End of Session Equipment Utilized During Treatment: Gait belt;Rolling walker (2 wheels) Nurse Communication: Mobility status  Activity Tolerance:   Patient left: in bed;with family/visitor present  OT Visit Diagnosis: Unsteadiness on feet (R26.81)                Time: 3086-5784 OT Time Calculation (min): 21 min Charges:  OT General Charges $OT Visit: 1 Visit OT Evaluation $OT Eval Moderate Complexity: 1 Mod  07/09/2023  RP, OTR/L  Acute Rehabilitation Services  Office:  [phone]   Suzanna Obey 07/09/2023, 8:46 AM

## 2023-07-10 ENCOUNTER — Other Ambulatory Visit: Payer: Self-pay

## 2023-07-10 DIAGNOSIS — F418 Other specified anxiety disorders: Secondary | ICD-10-CM | POA: Diagnosis not present

## 2023-07-10 DIAGNOSIS — M48061 Spinal stenosis, lumbar region without neurogenic claudication: Secondary | ICD-10-CM | POA: Diagnosis not present

## 2023-07-10 DIAGNOSIS — N183 Chronic kidney disease, stage 3 unspecified: Secondary | ICD-10-CM | POA: Diagnosis not present

## 2023-07-10 DIAGNOSIS — M1990 Unspecified osteoarthritis, unspecified site: Secondary | ICD-10-CM | POA: Diagnosis not present

## 2023-07-10 DIAGNOSIS — J45909 Unspecified asthma, uncomplicated: Secondary | ICD-10-CM | POA: Diagnosis not present

## 2023-07-10 DIAGNOSIS — M51369 Other intervertebral disc degeneration, lumbar region without mention of lumbar back pain or lower extremity pain: Secondary | ICD-10-CM | POA: Diagnosis not present

## 2023-07-10 DIAGNOSIS — Z950 Presence of cardiac pacemaker: Secondary | ICD-10-CM | POA: Diagnosis not present

## 2023-07-10 MED ORDER — METHYLPREDNISOLONE 4 MG PO TBPK
ORAL_TABLET | ORAL | 0 refills | Status: DC
  Filled 2023-07-10: qty 21, 6d supply, fill #0

## 2023-07-10 MED ORDER — METHOCARBAMOL 500 MG PO TABS
500.0000 mg | ORAL_TABLET | Freq: Four times a day (QID) | ORAL | 1 refills | Status: DC | PRN
  Filled 2023-07-10: qty 40, 10d supply, fill #0

## 2023-07-10 MED ORDER — HYDROCODONE-ACETAMINOPHEN 5-325 MG PO TABS
1.0000 | ORAL_TABLET | ORAL | 0 refills | Status: DC | PRN
  Filled 2023-07-10: qty 30, 3d supply, fill #0

## 2023-07-10 NOTE — Progress Notes (Signed)
Patient alert and oriented, void, ambulate, surgical wound clean and dry no sign of infection. D/c instructions explain and given to the patient, all questions answered.

## 2023-07-10 NOTE — Progress Notes (Signed)
Occupational Therapy Treatment Patient Details Name: Emma Rogers MRN: [id number] DOB: 1965-10-27 Today's Date: 07/10/2023   History of present illness 57 yo F s/p PLIF.  PMH includes: anxiety, asthma, pacemaker, arthritis, prior back surgery.   OT comments  Patient with good follow through of education yesterday.  All questions answered, patient able to complete ADL, hallway ambulation and up 5 stairs without assist.  No further OT needs in the acute setting and recommend follow up as prescribed by MD.        If plan is discharge home, recommend the following:  Assist for transportation   Equipment Recommendations  None recommended by OT    Recommendations for Other Services      Precautions / Restrictions Precautions Precautions: Back Precaution Booklet Issued: Yes (comment) Restrictions Weight Bearing Restrictions: No       Mobility Bed Mobility Overal bed mobility: Modified Independent                  Transfers Overall transfer level: Modified independent                       Balance Overall balance assessment: Mild deficits observed, not formally tested                                         ADL either performed or assessed with clinical judgement   ADL Overall ADL's : Modified independent                                            Extremity/Trunk Assessment Upper Extremity Assessment Upper Extremity Assessment: Overall WFL for tasks assessed   Lower Extremity Assessment Lower Extremity Assessment: Overall WFL for tasks assessed   Cervical / Trunk Assessment Cervical / Trunk Assessment: Back Surgery    Vision Baseline Vision/History: 1 Wears glasses Patient Visual Report: No change from baseline     Perception Perception Perception: Within Functional Limits   Praxis Praxis Praxis: WFL    Cognition Arousal: Alert Behavior During Therapy: WFL for tasks assessed/performed Overall Cognitive  Status: Within Functional Limits for tasks assessed                                                             Pertinent Vitals/ Pain       Pain Assessment Pain Assessment: Faces Faces Pain Scale: Hurts little more Pain Location: Back Pain Descriptors / Indicators: Sore Pain Intervention(s): Monitored during session                                                          Frequency           Progress Toward Goals  OT Goals(current goals can now be found in the care plan section)  Progress towards OT goals: Goals met/education completed, patient discharged from OT  Acute Rehab OT Goals OT Goal Formulation: With patient Time  For Goal Achievement: 07/16/23 Potential to Achieve Goals: Good  Plan      Co-evaluation                 AM-PAC OT "6 Clicks" Daily Activity     Outcome Measure   Help from another person eating meals?: None Help from another person taking care of personal grooming?: None Help from another person toileting, which includes using toliet, bedpan, or urinal?: None Help from another person bathing (including washing, rinsing, drying)?: None Help from another person to put on and taking off regular upper body clothing?: None Help from another person to put on and taking off regular lower body clothing?: None 6 Click Score: 24    End of Session Equipment Utilized During Treatment: Rolling walker (2 wheels)  OT Visit Diagnosis: Pain   Activity Tolerance Patient tolerated treatment well   Patient Left in chair   Nurse Communication Mobility status        Time: 1308-6578 OT Time Calculation (min): 18 min  Charges: OT General Charges $OT Visit: 1 Visit OT Treatments $Self Care/Home Management : 8-22 mins  07/10/2023  RP, OTR/L  Acute Rehabilitation Services  Office:  [phone]   Suzanna Obey 07/10/2023, 8:37 AM

## 2023-07-10 NOTE — Discharge Instructions (Signed)
 Wound Care Keep incision covered and dry until post op day 3. You may remove the Honeycomb dressing on post op day 3. Leave steri-strips on back.  They will fall off by themselves. Do not put any creams, lotions, or ointments on incision. You are fine to shower. Let water run over incision and pat dry.  Activity Activity Walk each and every day, increasing distance each day. No lifting greater than 8 lbs.  No lifting no bending no twisting no driving or riding a car unless coming back and forth to see the doctor. If provided with back brace, wear when out of bed.  It is not necessary to wear brace in bed.  Diet Resume your normal diet.   Return to Work Will be discussed at your follow up appointment.  Call Your Doctor If Any of These Occur Redness, drainage, or swelling at the wound.  Temperature greater than 101 degrees. Severe pain not relieved by pain medication. Incision starts to come apart.  Follow Up Appt Call [phone] if you have one or any problem.

## 2023-07-10 NOTE — Discharge Summary (Signed)
Physician Discharge Summary  Patient ID: Emma Rogers MRN: [id number] DOB/AGE: 1966-04-23 57 y.o. Estimated body mass index is 35.77 kg/m as calculated from the following:   Height as of this encounter: 5' 4.5" (1.638 m).   Weight as of this encounter: 96 kg.   Admit date: 07/08/2023 Discharge date: 07/10/2023  Admission Diagnoses: Instability L2-3  Discharge Diagnoses: Same Principal Problem:   Spinal stenosis of lumbar region   Discharged Condition: good  Hospital Course: Patient was admitted to the hospital underwent decompressive laminectomy interbody fusion L2-3 with extension postoperatively patient did very well had some radiculitis or some lateral femoral cutaneous symptoms immediately postop but they were resolved by postop day 2 patient is stable for discharge home will be discharged on a Medrol Dosepak as well as pain medication and muscle relaxer.  Scheduled follow-up in 1 to 2 weeks.  Consults: Significant Diagnostic Studies: L2-3 posterior lumbar interbody fusion Treatments: L2-3 posterior lumbar interbody fusion Discharge Exam: Blood pressure 113/62, pulse (!) 59, temperature 98.6 F (37 C), temperature source Oral, resp. rate 20, height 5' 4.5" (1.638 m), weight 96 kg, SpO2 95%. Strength 5 out of 5 wound clean dry and intact  Disposition: Home   Allergies as of 07/10/2023       Reactions   Augmentin [amoxicillin-pot Clavulanate] Diarrhea, Nausea And Vomiting   Mostly Nausea and Upset Stomach    Morphine And Codeine Nausea And Vomiting   vomiting        Medication List     TAKE these medications    albuterol 108 (90 Base) MCG/ACT inhaler Commonly known as: VENTOLIN HFA Inhale 2 puffs into the lungs every 6 (six) hours as needed for wheezing or shortness of breath.   atorvastatin 40 MG tablet Commonly known as: LIPITOR Take 1 tablet (40 mg total) by mouth at bedtime. OFFICE VISIT NEEDED FOR ADDITIONAL REFILLS   buPROPion 300 MG 24 hr  tablet Commonly known as: WELLBUTRIN XL Take 1 tablet (300 mg total) by mouth daily.   buPROPion 150 MG 24 hr tablet Commonly known as: Wellbutrin XL Take 1 tablet (150 mg total) by mouth daily.   furosemide 20 MG tablet Commonly known as: LASIX Take 1 tablet (20 mg total) by mouth daily as needed. OFFICE VISIT NEEDED FOR ADDITIONAL REFILLS   gabapentin 300 MG capsule Commonly known as: NEURONTIN Take 1 capsule (300 mg total) by mouth at bedtime.   HYDROcodone-acetaminophen 5-325 MG tablet Commonly known as: NORCO/VICODIN Take 1-2 tablets by mouth every 4 (four) hours as needed for severe pain (pain score 7-10) ((score 7 to 10)).   methocarbamol 500 MG tablet Commonly known as: ROBAXIN Take 1 tablet (500 mg total) by mouth 4 (four) times daily. What changed:  when to take this reasons to take this   methocarbamol 500 MG tablet Commonly known as: ROBAXIN Take 1 tablet (500 mg total) by mouth 4 (four) times daily as needed for muscle spasms (Spasms). What changed: You were already taking a medication with the same name, and this prescription was added. Make sure you understand how and when to take each.   methylPREDNISolone 4 MG Tbpk tablet Commonly known as: MEDROL DOSEPAK As directed   rizatriptan 10 MG tablet Commonly known as: MAXALT Take 1 tablet (10 mg total) by mouth as needed for migraine. May repeat in 2 hours if needed   sertraline 50 MG tablet Commonly known as: ZOLOFT Take 1 tablet (50 mg total) by mouth daily.   Symbicort 80-4.5 MCG/ACT inhaler Generic  drug: budesonide-formoterol Inhale 2 puffs into the lungs 2 (two) times daily.   traMADol 50 MG tablet Commonly known as: ULTRAM Take 1 tablet (50 mg total) by mouth every 6 (six) hours as needed.   traZODone 100 MG tablet Commonly known as: DESYREL Take 2 tablets (200 mg total) by mouth at bedtime.   Tremfya 100 MG/ML pen Generic drug: guselkumab Inject 100 mg into the skin every 8 weeks          Signed: Mariam Dollar 07/10/2023, 7:34 AM

## 2023-07-11 NOTE — Plan of Care (Signed)
[facility] Tonsillectomy/Adenoidectomy, Postoperative PEDS care plan entered in error.

## 2023-07-14 ENCOUNTER — Encounter: Payer: Commercial Managed Care - PPO | Admitting: Physical Therapy

## 2023-07-16 ENCOUNTER — Encounter: Payer: Commercial Managed Care - PPO | Admitting: Physical Therapy

## 2023-07-16 ENCOUNTER — Other Ambulatory Visit: Payer: Self-pay

## 2023-07-16 DIAGNOSIS — M48062 Spinal stenosis, lumbar region with neurogenic claudication: Secondary | ICD-10-CM | POA: Diagnosis not present

## 2023-07-16 DIAGNOSIS — M25552 Pain in left hip: Secondary | ICD-10-CM | POA: Diagnosis not present

## 2023-07-16 DIAGNOSIS — Z981 Arthrodesis status: Secondary | ICD-10-CM | POA: Diagnosis not present

## 2023-07-16 DIAGNOSIS — I878 Other specified disorders of veins: Secondary | ICD-10-CM | POA: Diagnosis not present

## 2023-07-16 MED ORDER — OXYCODONE-ACETAMINOPHEN 10-325 MG PO TABS
1.0000 | ORAL_TABLET | Freq: Four times a day (QID) | ORAL | 0 refills | Status: DC | PRN
  Filled 2023-07-16: qty 45, 12d supply, fill #0

## 2023-07-16 NOTE — Plan of Care (Signed)
[facility] Tonsillectomy/Adenoidectomy, Postoperative PEDS care plan entered in error.

## 2023-07-17 ENCOUNTER — Encounter: Payer: Self-pay | Admitting: Cardiology

## 2023-07-17 ENCOUNTER — Other Ambulatory Visit: Payer: Self-pay | Admitting: Neurosurgery

## 2023-07-17 ENCOUNTER — Encounter (HOSPITAL_COMMUNITY): Payer: Self-pay | Admitting: Neurosurgery

## 2023-07-17 NOTE — Progress Notes (Signed)
PERIOPERATIVE PRESCRIPTION FOR IMPLANTED CARDIAC DEVICE PROGRAMMING  Patient Information: Name:  Emma Rogers  DOB:  03/26/1966  MRN:  [id number]  Planned Procedure:  Revision of spinal fusion left side with globus addition set - left  Surgeon:  Dr. Donalee Citrin  Date of Procedure:  07/20/2023  Cautery will be used.  Position during surgery:  Supine   Device Information:  Clinic EP Physician:  Loman Brooklyn, MD   Device Type:  Pacemaker Manufacturer and Phone #:  Medtronic: [id number] Pacemaker Dependent?:  No. Date of Last Device Check:  05/28/2023 Normal Device Function?:  Yes.    Electrophysiologist's Recommendations:  Have magnet available. Provide continuous ECG monitoring when magnet is used or reprogramming is to be performed.  Procedure will likely interfere with device function.  Device should be programmed:  Asynchronous pacing during procedure and returned to normal programming after procedure  Per Device Clinic Standing Orders, Wiliam Ke, RN  4:48 PM 07/17/2023

## 2023-07-17 NOTE — Progress Notes (Addendum)
SDW call  Patient was given pre-op instructions over the phone. Patient verbalized understanding of instructions provided.     PCP - Dr. Olevia Perches Cardiologist - Dr. Loman Brooklyn Pulmonary:    PPM/ICD - Medtronic Pacemaker,  Requested device orders  Device Orders - requested Rep Notified - Shari Prows and [facility] via email   Chest x-ray - na EKG -  01/01/2023 Stress Test - ECHO - 01/11/2018 Cardiac Cath -   Sleep Study/sleep apnea/CPAP: denies  Non-diabetic  Blood Thinner Instructions: denies Aspirin Instructions:denies   ERAS Protcol - Clear fluids until 1400   COVID TEST- na    Anesthesia review: Yes. Previously reviewed on 07/01/2023 by Antionette Poles, PA-C   Patient denies shortness of breath, fever, cough and chest pain over the phone call  Your procedure is scheduled on Monday July 20, 2023  Call [facility] OR Front Desk at [phone] at [phone] for time of arrival. Then check in with the Admitting office.  Call this number if you have problems the morning of surgery:  [phone]   If you have any questions prior to your surgery date call [phone]: Open Monday-Friday 8am-4pm If you experience any cold or flu symptoms such as cough, fever, chills, shortness of breath, etc. between now and your scheduled surgery, please notify us at the above number    Remember:  Do not eat or drink after midnight the night before your surgery   Take these medicines the morning of surgery with A SIP OF WATER:  Wellbutrin,  zoloft  As needed: Albuterol, robaxin, maxalt, ultram, norco  As of today, STOP taking any Aspirin (unless otherwise instructed by your surgeon) Aleve, Naproxen, Ibuprofen, Motrin, Advil, Goody's, BC's, all herbal medications, fish oil, and all vitamins.

## 2023-07-17 NOTE — Anesthesia Preprocedure Evaluation (Signed)
Anesthesia Evaluation  Patient identified by MRN, date of birth, ID band Patient awake    Reviewed: Allergy & Precautions, NPO status , Patient's Chart, lab work & pertinent test results  History of Anesthesia Complications (+) PONV  Airway Mallampati: II  TM Distance: >3 FB Neck ROM: Full    Dental  (+) Dental Advisory Given, Teeth Intact   Pulmonary asthma , COPD,  COPD inhaler   breath sounds clear to auscultation       Cardiovascular + pacemaker (for neurocardiac syncope)  Rhythm:Regular Rate:Normal  '19 ECHO:  - Left ventricle: The cavity size was normal. Systolic function was    normal. The estimated ejection fraction was in the range of 60%    to 65%. Wall motion was normal; there were no regional wall    motion abnormalities. Left ventricular diastolic function    parameters were normal.  - Left atrium: The atrium was normal in size.  - Right ventricle: Pacer wire or catheter noted in right ventricle.    Systolic function was normal.  - Pulmonary arteries: Systolic pressure was within the normal    range.  -no significant valvular abnormalities   Neuro/Psych  Headaches  Anxiety Depression    Back pain: narcotics daily    GI/Hepatic negative GI ROS, Neg liver ROS,,,  Endo/Other  negative endocrine ROS    Renal/GU Renal InsufficiencyRenal disease (on lasix)     Musculoskeletal   Abdominal   Peds  Hematology negative hematology ROS (+)   Anesthesia Other Findings   Reproductive/Obstetrics                              Anesthesia Physical Anesthesia Plan  ASA: 3  Anesthesia Plan: General   Post-op Pain Management: Ofirmev IV (intra-op)*   Induction: Intravenous  PONV Risk Score and Plan: 4 or greater and Ondansetron, Dexamethasone and Scopolamine patch - Pre-op  Airway Management Planned: Oral ETT  Additional Equipment: None  Intra-op Plan:   Post-operative Plan:  Extubation in OR  Informed Consent: I have reviewed the patients History and Physical, chart, labs and discussed the procedure including the risks, benefits and alternatives for the proposed anesthesia with the patient or authorized representative who has indicated his/her understanding and acceptance.     Dental advisory given  Plan Discussed with: CRNA and Surgeon  Anesthesia Plan Comments: (See Anesthesia APP note by Antionette Poles, PA-C from encounter 07/01/23. )        Anesthesia Quick Evaluation

## 2023-07-20 ENCOUNTER — Encounter (HOSPITAL_COMMUNITY): Payer: Self-pay | Admitting: Neurosurgery

## 2023-07-20 ENCOUNTER — Encounter (HOSPITAL_COMMUNITY)
Admission: RE | Disposition: A | Discharge status: Home / Self Care | Payer: Self-pay | Source: Home / Self Care | Attending: Neurosurgery

## 2023-07-20 ENCOUNTER — Ambulatory Visit (HOSPITAL_COMMUNITY)
Admission: RE | Admit: 2023-07-20 | Discharge: 2023-07-20 | Disposition: A | Discharge status: Home / Self Care | Payer: Commercial Managed Care - PPO | Source: Ambulatory Visit | Attending: Student | Admitting: Student

## 2023-07-20 ENCOUNTER — Other Ambulatory Visit (HOSPITAL_COMMUNITY): Payer: Self-pay | Admitting: Student

## 2023-07-20 ENCOUNTER — Ambulatory Visit (HOSPITAL_COMMUNITY)
Admission: RE | Admit: 2023-07-20 | Discharge: 2023-07-21 | Disposition: A | Discharge status: Home / Self Care | Payer: Commercial Managed Care - PPO | Attending: Neurosurgery | Admitting: Neurosurgery

## 2023-07-20 ENCOUNTER — Other Ambulatory Visit: Payer: Self-pay

## 2023-07-20 ENCOUNTER — Inpatient Hospital Stay (HOSPITAL_COMMUNITY): Payer: Commercial Managed Care - PPO | Admitting: Vascular Surgery

## 2023-07-20 DIAGNOSIS — X58XXXA Exposure to other specified factors, initial encounter: Secondary | ICD-10-CM | POA: Diagnosis not present

## 2023-07-20 DIAGNOSIS — N183 Chronic kidney disease, stage 3 unspecified: Secondary | ICD-10-CM | POA: Diagnosis not present

## 2023-07-20 DIAGNOSIS — M48062 Spinal stenosis, lumbar region with neurogenic claudication: Secondary | ICD-10-CM | POA: Insufficient documentation

## 2023-07-20 DIAGNOSIS — T84296A Other mechanical complication of internal fixation device of vertebrae, initial encounter: Secondary | ICD-10-CM | POA: Diagnosis not present

## 2023-07-20 DIAGNOSIS — M549 Dorsalgia, unspecified: Secondary | ICD-10-CM | POA: Diagnosis present

## 2023-07-20 DIAGNOSIS — T84226A Displacement of internal fixation device of vertebrae, initial encounter: Secondary | ICD-10-CM

## 2023-07-20 DIAGNOSIS — Z981 Arthrodesis status: Secondary | ICD-10-CM | POA: Diagnosis not present

## 2023-07-20 DIAGNOSIS — M51369 Other intervertebral disc degeneration, lumbar region without mention of lumbar back pain or lower extremity pain: Secondary | ICD-10-CM | POA: Diagnosis not present

## 2023-07-20 DIAGNOSIS — Z955 Presence of coronary angioplasty implant and graft: Secondary | ICD-10-CM | POA: Insufficient documentation

## 2023-07-20 DIAGNOSIS — M48061 Spinal stenosis, lumbar region without neurogenic claudication: Secondary | ICD-10-CM | POA: Diagnosis not present

## 2023-07-20 DIAGNOSIS — J449 Chronic obstructive pulmonary disease, unspecified: Secondary | ICD-10-CM | POA: Diagnosis not present

## 2023-07-20 DIAGNOSIS — Z7722 Contact with and (suspected) exposure to environmental tobacco smoke (acute) (chronic): Secondary | ICD-10-CM | POA: Diagnosis not present

## 2023-07-20 DIAGNOSIS — I129 Hypertensive chronic kidney disease with stage 1 through stage 4 chronic kidney disease, or unspecified chronic kidney disease: Secondary | ICD-10-CM | POA: Diagnosis not present

## 2023-07-20 DIAGNOSIS — Z9889 Other specified postprocedural states: Principal | ICD-10-CM

## 2023-07-20 HISTORY — PX: LAMINECTOMY WITH POSTERIOR LATERAL ARTHRODESIS LEVEL 1: SHX6335

## 2023-07-20 LAB — TYPE AND SCREEN
ABO/RH(D): A POS
Antibody Screen: NEGATIVE

## 2023-07-20 SURGERY — LAMINECTOMY WITH POSTERIOR LATERAL ARTHRODESIS LEVEL 1
Anesthesia: General | Site: Back | Laterality: Left

## 2023-07-20 MED ORDER — PROPOFOL 10 MG/ML IV BOLUS
INTRAVENOUS | Status: AC
  Filled 2023-07-20: qty 20

## 2023-07-20 MED ORDER — SODIUM CHLORIDE 0.9% FLUSH: 3.0000 mL | Freq: Two times a day (BID) | INTRAVENOUS | Status: DC

## 2023-07-20 MED ORDER — LIDOCAINE 2% (20 MG/ML) 5 ML SYRINGE
INTRAMUSCULAR | Status: DC | PRN
  Administered 2023-07-20: 20 mg via INTRAVENOUS

## 2023-07-20 MED ORDER — OXYCODONE HCL 5 MG PO TABS
5.0000 mg | ORAL_TABLET | Freq: Four times a day (QID) | ORAL | Status: DC | PRN
  Administered 2023-07-21 (×2): 5 mg via ORAL
  Filled 2023-07-20 (×2): qty 1

## 2023-07-20 MED ORDER — PHENYLEPHRINE 80 MCG/ML (10ML) SYRINGE FOR IV PUSH (FOR BLOOD PRESSURE SUPPORT)
PREFILLED_SYRINGE | INTRAVENOUS | Status: DC | PRN
  Administered 2023-07-20: 160 ug via INTRAVENOUS
  Administered 2023-07-20: 80 ug via INTRAVENOUS

## 2023-07-20 MED ORDER — PHENOL 1.4 % MT LIQD: 1.0000 | OROMUCOSAL | Status: DC | PRN

## 2023-07-20 MED ORDER — SODIUM CHLORIDE 0.9 % IV SOLN: 250.0000 mL | INTRAVENOUS | Status: DC

## 2023-07-20 MED ORDER — ONDANSETRON HCL 4 MG/2ML IJ SOLN
INTRAMUSCULAR | Status: DC | PRN
  Administered 2023-07-20 (×2): 4 mg via INTRAVENOUS

## 2023-07-20 MED ORDER — FENTANYL CITRATE (PF) 250 MCG/5ML IJ SOLN
INTRAMUSCULAR | Status: AC
  Filled 2023-07-20: qty 5

## 2023-07-20 MED ORDER — HYDROCODONE-ACETAMINOPHEN 5-325 MG PO TABS: 1.0000 | ORAL_TABLET | ORAL | Status: DC | PRN

## 2023-07-20 MED ORDER — ONDANSETRON HCL 4 MG/2ML IJ SOLN
INTRAMUSCULAR | Status: AC
  Filled 2023-07-20: qty 2

## 2023-07-20 MED ORDER — THROMBIN 20000 UNITS EX SOLR
CUTANEOUS | Status: AC
  Filled 2023-07-20: qty 20000

## 2023-07-20 MED ORDER — PHENYLEPHRINE HCL-NACL 20-0.9 MG/250ML-% IV SOLN
INTRAVENOUS | Status: AC
  Filled 2023-07-20: qty 250

## 2023-07-20 MED ORDER — 0.9 % SODIUM CHLORIDE (POUR BTL) OPTIME
TOPICAL | Status: DC | PRN
  Administered 2023-07-20: 1000 mL

## 2023-07-20 MED ORDER — ORAL CARE MOUTH RINSE: 15.0000 mL | Freq: Once | OROMUCOSAL | Status: AC

## 2023-07-20 MED ORDER — CEFAZOLIN SODIUM-DEXTROSE 2-4 GM/100ML-% IV SOLN
2.0000 g | Freq: Once | INTRAVENOUS | Status: AC
  Administered 2023-07-20: 2 g via INTRAVENOUS

## 2023-07-20 MED ORDER — SUMATRIPTAN SUCCINATE 25 MG PO TABS: 25.0000 mg | ORAL_TABLET | ORAL | Status: DC | PRN

## 2023-07-20 MED ORDER — BUPROPION HCL ER (XL) 300 MG PO TB24
300.0000 mg | ORAL_TABLET | Freq: Every day | ORAL | Status: DC
  Administered 2023-07-21: 300 mg via ORAL
  Filled 2023-07-20: qty 1

## 2023-07-20 MED ORDER — TRAMADOL HCL 50 MG PO TABS: 50.0000 mg | ORAL_TABLET | Freq: Four times a day (QID) | ORAL | Status: DC | PRN

## 2023-07-20 MED ORDER — ATORVASTATIN CALCIUM 40 MG PO TABS
40.0000 mg | ORAL_TABLET | Freq: Every day | ORAL | Status: DC
  Administered 2023-07-20: 40 mg via ORAL
  Filled 2023-07-20: qty 1

## 2023-07-20 MED ORDER — HYDROMORPHONE HCL 1 MG/ML IJ SOLN: 0.5000 mg | INTRAMUSCULAR | Status: DC | PRN

## 2023-07-20 MED ORDER — CEFAZOLIN SODIUM-DEXTROSE 2-4 GM/100ML-% IV SOLN
INTRAVENOUS | Status: AC
  Filled 2023-07-20: qty 100

## 2023-07-20 MED ORDER — MENTHOL 3 MG MT LOZG: 1.0000 | LOZENGE | OROMUCOSAL | Status: DC | PRN

## 2023-07-20 MED ORDER — OXYCODONE HCL 5 MG/5ML PO SOLN: 5.0000 mg | Freq: Once | ORAL | Status: DC | PRN

## 2023-07-20 MED ORDER — SENNOSIDES-DOCUSATE SODIUM 8.6-50 MG PO TABS
1.0000 | ORAL_TABLET | Freq: Two times a day (BID) | ORAL | Status: DC | PRN
  Administered 2023-07-21: 2 via ORAL
  Filled 2023-07-20: qty 2

## 2023-07-20 MED ORDER — PROPOFOL 10 MG/ML IV BOLUS
INTRAVENOUS | Status: DC | PRN
  Administered 2023-07-20: 100 mg via INTRAVENOUS

## 2023-07-20 MED ORDER — ONDANSETRON HCL 4 MG PO TABS: 4.0000 mg | ORAL_TABLET | Freq: Four times a day (QID) | ORAL | Status: DC | PRN

## 2023-07-20 MED ORDER — DEXAMETHASONE SODIUM PHOSPHATE 10 MG/ML IJ SOLN
INTRAMUSCULAR | Status: AC
  Filled 2023-07-20: qty 1

## 2023-07-20 MED ORDER — BUPIVACAINE HCL 0.25 % IJ SOLN
INTRAMUSCULAR | Status: DC | PRN
  Administered 2023-07-20: 10 mL

## 2023-07-20 MED ORDER — HYDROMORPHONE HCL 1 MG/ML IJ SOLN
INTRAMUSCULAR | Status: AC
  Filled 2023-07-20: qty 1

## 2023-07-20 MED ORDER — CEFAZOLIN SODIUM-DEXTROSE 2-4 GM/100ML-% IV SOLN
2.0000 g | Freq: Three times a day (TID) | INTRAVENOUS | Status: DC
Start: 2023-07-20 — End: 2023-07-20
  Filled 2023-07-20: qty 100

## 2023-07-20 MED ORDER — FENTANYL CITRATE (PF) 250 MCG/5ML IJ SOLN
INTRAMUSCULAR | Status: DC | PRN
  Administered 2023-07-20: 100 ug via INTRAVENOUS
  Administered 2023-07-20: 150 ug via INTRAVENOUS

## 2023-07-20 MED ORDER — LIDOCAINE 2% (20 MG/ML) 5 ML SYRINGE
INTRAMUSCULAR | Status: AC
  Filled 2023-07-20: qty 5

## 2023-07-20 MED ORDER — SERTRALINE HCL 50 MG PO TABS
50.0000 mg | ORAL_TABLET | Freq: Every day | ORAL | Status: DC
  Administered 2023-07-21: 50 mg via ORAL
  Filled 2023-07-20: qty 1

## 2023-07-20 MED ORDER — MOMETASONE FURO-FORMOTEROL FUM 100-5 MCG/ACT IN AERO
2.0000 | INHALATION_SPRAY | Freq: Two times a day (BID) | RESPIRATORY_TRACT | Status: DC
  Administered 2023-07-21: 2 via RESPIRATORY_TRACT
  Filled 2023-07-20: qty 8.8

## 2023-07-20 MED ORDER — ACETAMINOPHEN 650 MG RE SUPP: 650.0000 mg | RECTAL | Status: DC | PRN

## 2023-07-20 MED ORDER — SUCCINYLCHOLINE CHLORIDE 200 MG/10ML IV SOSY
PREFILLED_SYRINGE | INTRAVENOUS | Status: AC
  Filled 2023-07-20: qty 10

## 2023-07-20 MED ORDER — SUGAMMADEX SODIUM 200 MG/2ML IV SOLN
INTRAVENOUS | Status: DC | PRN
  Administered 2023-07-20: 200 mg via INTRAVENOUS

## 2023-07-20 MED ORDER — MIDAZOLAM HCL 2 MG/2ML IJ SOLN
INTRAMUSCULAR | Status: AC
  Filled 2023-07-20: qty 2

## 2023-07-20 MED ORDER — LACTATED RINGERS IV SOLN: INTRAVENOUS | Status: DC

## 2023-07-20 MED ORDER — SCOPOLAMINE 1 MG/3DAYS TD PT72
1.0000 | MEDICATED_PATCH | TRANSDERMAL | Status: DC
  Administered 2023-07-20: 1 via TRANSDERMAL
  Filled 2023-07-20: qty 1

## 2023-07-20 MED ORDER — TRAZODONE HCL 100 MG PO TABS
200.0000 mg | ORAL_TABLET | Freq: Every day | ORAL | Status: DC
  Administered 2023-07-20: 200 mg via ORAL
  Filled 2023-07-20: qty 2

## 2023-07-20 MED ORDER — THROMBIN 20000 UNITS EX SOLR
CUTANEOUS | Status: DC | PRN
  Administered 2023-07-20: 20 mL via TOPICAL

## 2023-07-20 MED ORDER — OXYCODONE-ACETAMINOPHEN 5-325 MG PO TABS
1.0000 | ORAL_TABLET | Freq: Four times a day (QID) | ORAL | Status: DC | PRN
  Administered 2023-07-20 – 2023-07-21 (×2): 1 via ORAL
  Filled 2023-07-20 (×2): qty 1

## 2023-07-20 MED ORDER — HYDROMORPHONE HCL 1 MG/ML IJ SOLN
0.2500 mg | INTRAMUSCULAR | Status: DC | PRN
  Administered 2023-07-20 (×2): 0.5 mg via INTRAVENOUS

## 2023-07-20 MED ORDER — ACETAMINOPHEN 325 MG PO TABS: 650.0000 mg | ORAL_TABLET | ORAL | Status: DC | PRN

## 2023-07-20 MED ORDER — ROCURONIUM BROMIDE 10 MG/ML (PF) SYRINGE
PREFILLED_SYRINGE | INTRAVENOUS | Status: AC
  Filled 2023-07-20: qty 10

## 2023-07-20 MED ORDER — OXYCODONE HCL 5 MG PO TABS: 5.0000 mg | ORAL_TABLET | Freq: Once | ORAL | Status: DC | PRN

## 2023-07-20 MED ORDER — PANTOPRAZOLE SODIUM 40 MG IV SOLR: 40.0000 mg | Freq: Every day | INTRAVENOUS | Status: DC

## 2023-07-20 MED ORDER — MIDAZOLAM HCL 2 MG/2ML IJ SOLN
INTRAMUSCULAR | Status: DC | PRN
  Administered 2023-07-20: 2 mg via INTRAVENOUS

## 2023-07-20 MED ORDER — CHLORHEXIDINE GLUCONATE 0.12 % MT SOLN
15.0000 mL | Freq: Once | OROMUCOSAL | Status: AC
Start: 2023-07-20 — End: 2023-07-20
  Administered 2023-07-20: 15 mL via OROMUCOSAL
  Filled 2023-07-20: qty 15

## 2023-07-20 MED ORDER — MIDAZOLAM HCL 2 MG/2ML IJ SOLN
0.5000 mg | Freq: Once | INTRAMUSCULAR | Status: DC | PRN
Start: 2023-07-20 — End: 2023-07-20

## 2023-07-20 MED ORDER — METHOCARBAMOL 500 MG PO TABS: 500.0000 mg | ORAL_TABLET | Freq: Four times a day (QID) | ORAL | Status: DC | PRN

## 2023-07-20 MED ORDER — CEFAZOLIN SODIUM-DEXTROSE 2-4 GM/100ML-% IV SOLN
2.0000 g | Freq: Three times a day (TID) | INTRAVENOUS | Status: AC
  Administered 2023-07-21 (×2): 2 g via INTRAVENOUS
  Filled 2023-07-20 (×2): qty 100

## 2023-07-20 MED ORDER — FUROSEMIDE 20 MG PO TABS: 20.0000 mg | ORAL_TABLET | Freq: Every day | ORAL | Status: DC | PRN

## 2023-07-20 MED ORDER — METHOCARBAMOL 500 MG PO TABS
500.0000 mg | ORAL_TABLET | Freq: Four times a day (QID) | ORAL | Status: DC | PRN
  Administered 2023-07-20 – 2023-07-21 (×2): 500 mg via ORAL
  Filled 2023-07-20 (×2): qty 1

## 2023-07-20 MED ORDER — SODIUM CHLORIDE 0.9% FLUSH: 3.0000 mL | INTRAVENOUS | Status: DC | PRN

## 2023-07-20 MED ORDER — DEXAMETHASONE SODIUM PHOSPHATE 10 MG/ML IJ SOLN
INTRAMUSCULAR | Status: DC | PRN
  Administered 2023-07-20: 5 mg via INTRAVENOUS

## 2023-07-20 MED ORDER — MEPERIDINE HCL 25 MG/ML IJ SOLN: 6.2500 mg | INTRAMUSCULAR | Status: DC | PRN

## 2023-07-20 MED ORDER — ALBUTEROL SULFATE (2.5 MG/3ML) 0.083% IN NEBU: 2.5000 mg | INHALATION_SOLUTION | Freq: Four times a day (QID) | RESPIRATORY_TRACT | Status: DC | PRN

## 2023-07-20 MED ORDER — BUPROPION HCL ER (XL) 150 MG PO TB24
150.0000 mg | ORAL_TABLET | Freq: Every day | ORAL | Status: DC
  Administered 2023-07-21: 150 mg via ORAL
  Filled 2023-07-20: qty 1

## 2023-07-20 MED ORDER — ONDANSETRON HCL 4 MG/2ML IJ SOLN: 4.0000 mg | Freq: Four times a day (QID) | INTRAMUSCULAR | Status: DC | PRN

## 2023-07-20 MED ORDER — KETAMINE HCL 50 MG/5ML IJ SOSY
PREFILLED_SYRINGE | INTRAMUSCULAR | Status: AC
  Filled 2023-07-20: qty 5

## 2023-07-20 MED ORDER — OXYCODONE-ACETAMINOPHEN 10-325 MG PO TABS: 1.0000 | ORAL_TABLET | Freq: Four times a day (QID) | ORAL | Status: DC | PRN

## 2023-07-20 MED ORDER — ALUM & MAG HYDROXIDE-SIMETH 200-200-20 MG/5ML PO SUSP
30.0000 mL | Freq: Four times a day (QID) | ORAL | Status: DC | PRN
Start: 2023-07-20 — End: 2023-07-21

## 2023-07-20 MED ORDER — GABAPENTIN 300 MG PO CAPS
300.0000 mg | ORAL_CAPSULE | Freq: Every day | ORAL | Status: DC
  Administered 2023-07-20: 300 mg via ORAL
  Filled 2023-07-20: qty 1

## 2023-07-20 MED ORDER — PANTOPRAZOLE SODIUM 40 MG PO TBEC
40.0000 mg | DELAYED_RELEASE_TABLET | Freq: Every day | ORAL | Status: DC
  Administered 2023-07-20: 40 mg via ORAL
  Filled 2023-07-20: qty 1

## 2023-07-20 MED ORDER — ROCURONIUM BROMIDE 10 MG/ML (PF) SYRINGE
PREFILLED_SYRINGE | INTRAVENOUS | Status: DC | PRN
  Administered 2023-07-20: 60 mg via INTRAVENOUS

## 2023-07-20 SURGICAL SUPPLY — 60 items
ADH SKN CLS APL DERMABOND .7 (GAUZE/BANDAGES/DRESSINGS) ×1
APL SKNCLS STERI-STRIP NONHPOA (GAUZE/BANDAGES/DRESSINGS) ×1
BAG COUNTER SPONGE SURGICOUNT (BAG) ×2 IMPLANT
BAG SPNG CNTER NS LX DISP (BAG) ×1
BENZOIN TINCTURE PRP APPL 2/3 (GAUZE/BANDAGES/DRESSINGS) ×2 IMPLANT
BLADE CLIPPER SURG (BLADE) IMPLANT
BLADE SURG 11 STRL SS (BLADE) ×2 IMPLANT
BUR MATCHSTICK NEURO 3.0 LAGG (BURR) ×2 IMPLANT
BUR PRECISION FLUTE 6.0 (BURR) ×2 IMPLANT
CANISTER SUCT 3000ML PPV (MISCELLANEOUS) ×2 IMPLANT
CAP LOCKING THREADED (Cap) IMPLANT
CLAMP ROD TO ROD 5.5-6.5 SPINE (Clamp) IMPLANT
CNTNR URN SCR LID CUP LEK RST (MISCELLANEOUS) ×2 IMPLANT
CONT SPEC 4OZ STRL OR WHT (MISCELLANEOUS)
COVER BACK TABLE 24X17X13 BIG (DRAPES) IMPLANT
COVER BACK TABLE 60X90IN (DRAPES) ×2 IMPLANT
DERMABOND ADVANCED .7 DNX12 (GAUZE/BANDAGES/DRESSINGS) ×2 IMPLANT
DRAPE C-ARM 42X72 X-RAY (DRAPES) ×4 IMPLANT
DRAPE HALF SHEET 40X57 (DRAPES) IMPLANT
DRAPE LAPAROTOMY 100X72X124 (DRAPES) ×2 IMPLANT
DRAPE POUCH INSTRU U-SHP 10X18 (DRAPES) ×2 IMPLANT
DRAPE SURG 17X23 STRL (DRAPES) ×2 IMPLANT
DRSG OPSITE 4X5.5 SM (GAUZE/BANDAGES/DRESSINGS) IMPLANT
DRSG OPSITE POSTOP 4X8 (GAUZE/BANDAGES/DRESSINGS) IMPLANT
DURAPREP 26ML APPLICATOR (WOUND CARE) ×2 IMPLANT
ELECT REM PT RETURN 9FT ADLT (ELECTROSURGICAL) ×1
ELECTRODE REM PT RTRN 9FT ADLT (ELECTROSURGICAL) ×2 IMPLANT
EVACUATOR 1/8 PVC DRAIN (DRAIN) IMPLANT
EVACUATOR 3/16 PVC DRAIN (DRAIN) ×2 IMPLANT
GAUZE 4X4 16PLY ~~LOC~~+RFID DBL (SPONGE) IMPLANT
GAUZE SPONGE 4X4 12PLY STRL (GAUZE/BANDAGES/DRESSINGS) ×2 IMPLANT
GLOVE BIO SURGEON STRL SZ7 (GLOVE) IMPLANT
GLOVE BIO SURGEON STRL SZ8 (GLOVE) ×4 IMPLANT
GLOVE BIOGEL PI IND STRL 7.0 (GLOVE) IMPLANT
GLOVE EXAM NITRILE XL STR (GLOVE) IMPLANT
GLOVE INDICATOR 8.5 STRL (GLOVE) ×8 IMPLANT
GOWN STRL REUS W/ TWL LRG LVL3 (GOWN DISPOSABLE) IMPLANT
GOWN STRL REUS W/ TWL XL LVL3 (GOWN DISPOSABLE) ×4 IMPLANT
GOWN STRL REUS W/TWL 2XL LVL3 (GOWN DISPOSABLE) IMPLANT
GOWN STRL REUS W/TWL LRG LVL3 (GOWN DISPOSABLE)
GOWN STRL REUS W/TWL XL LVL3 (GOWN DISPOSABLE) ×1
KIT BASIN OR (CUSTOM PROCEDURE TRAY) ×2 IMPLANT
KIT TURNOVER KIT B (KITS) ×2 IMPLANT
NDL HYPO 25X1 1.5 SAFETY (NEEDLE) ×2 IMPLANT
NEEDLE HYPO 25X1 1.5 SAFETY (NEEDLE) IMPLANT
NS IRRIG 1000ML POUR BTL (IV SOLUTION) ×2 IMPLANT
PACK LAMINECTOMY NEURO (CUSTOM PROCEDURE TRAY) ×2 IMPLANT
PAD ARMBOARD 7.5X6 YLW CONV (MISCELLANEOUS) ×6 IMPLANT
SPIKE FLUID TRANSFER (MISCELLANEOUS) ×2 IMPLANT
SPONGE SURGIFOAM ABS GEL 100 (HEMOSTASIS) ×2 IMPLANT
SPONGE T-LAP 4X18 ~~LOC~~+RFID (SPONGE) IMPLANT
STRIP CLOSURE SKIN 1/2X4 (GAUZE/BANDAGES/DRESSINGS) ×4 IMPLANT
SUT VIC AB 0 CT1 18XCR BRD8 (SUTURE) ×4 IMPLANT
SUT VIC AB 0 CT1 8-18 (SUTURE) ×2
SUT VIC AB 2-0 CT1 18 (SUTURE) ×2 IMPLANT
SUT VICRYL 4-0 PS2 18IN ABS (SUTURE) ×2 IMPLANT
TOWEL GREEN STERILE (TOWEL DISPOSABLE) ×2 IMPLANT
TOWEL GREEN STERILE FF (TOWEL DISPOSABLE) ×2 IMPLANT
TRAY FOLEY MTR SLVR 16FR STAT (SET/KITS/TRAYS/PACK) ×2 IMPLANT
WATER STERILE IRR 1000ML POUR (IV SOLUTION) ×2 IMPLANT

## 2023-07-20 NOTE — Op Note (Signed)
Preoperative diagnosis: Malpositioned hardware.  Postoperative diagnosis: Same.  Procedure: Reexploration of lumbar fusion for repositioning of left sided rod by replacement of globus addition connector repositioning of the rod and utilizing 2 new top tightening knots for the L2-L3 globus cortical screws.  Surgeon: Donalee Citrin.  Anesthesia: General.  EBL: 200.  HPI: 57 year old female presented with increased back and left leg pain following lumbar fusion surgery workup revealed the rod to become disconnected from the connector into her old construct.  So I recommended reexploration and repositioning of displaced rod.  Preoperative CT scan did show good position of the screws and implants so I did not feel like we had to reposition any other implants.  I extensively went over the risks and benefits of this operation with her as well as perioperative course expectations of outcome and alternatives to surgery and she understood and agreed to proceed forward.  Operative procedure: Patient was brought into the OR was induced under general anesthesia positioned prone the Wilson frame her back was prepped and draped in routine sterile fashion.  Roll incision was opened up and immediately seromatous fluid was aspirated cut the fascial sutures and placed the self-retaining retractors in place and immediately identified the hardware disconnected the left sided rod and cortical screws and the rod was sitting outside the medial aspect of the connector.  I tried to recontour the rod however there was too much torque and it was not meeting up flush with the anchoring knots on the connector so I elected to replace the connector with the 1 that was slightly longer that would [facility] often line up with the rod better in its trajectory.  Probably there was too much torque on the connector knots to begin with and that is why the rod displaced out of the connector.  so I utilized a different connector but reconnected that to  her old construct as well as a new construct confirmed that we were in good position and adequately anchored in place replaced the cortical screw knots and torqued everything down.  I also reinspected the foramina to confirm no migration of graft material and adequate decompression.  After everything was anchored in place I placed a medium Hemovac drain and injected Marcaine in the fascia and closed the wound in layers with interrupted Vicryl and a running 4 subcuticular.  Dermabond benzoin Steri-Strips and sterile dressing was applied patient recovery in stable condition.  At the end the case all needle counts and sponge counts were correct.

## 2023-07-20 NOTE — Anesthesia Postprocedure Evaluation (Signed)
Anesthesia Post Note  Patient: Emma Rogers  Procedure(s) Performed: Revision of spinal fusion left side with globus addition set (Left: Back)     Patient location during evaluation: PACU Anesthesia Type: General Level of consciousness: awake and alert, patient cooperative and oriented Pain management: pain level controlled Vital Signs Assessment: post-procedure vital signs reviewed and stable Respiratory status: spontaneous breathing, nonlabored ventilation and respiratory function stable Cardiovascular status: blood pressure returned to baseline and stable Postop Assessment: no apparent nausea or vomiting Anesthetic complications: no   No notable events documented.  Last Vitals:  Vitals:   07/20/23 2035 07/20/23 2050  BP: (!) 144/80 (!) 141/74  Pulse: 69 71  Resp: 14 (!) 29  Temp:    SpO2: 95% 96%    Last Pain:  Vitals:   07/20/23 2050  PainSc: 6                  Reshunda Strider,E. Njeri Vicente

## 2023-07-20 NOTE — H&P (Signed)
Emma Rogers is an 57 y.o. female.   Chief Complaint: Back and left leg pain HPI: 57 year old female who in the last 2 to 3 weeks has undergone an L2-3 posterior lumbar interbody fusion extension.  Patient developed recurrent left L3 radicular symptoms pretty quickly postop patient was managed on steroids and then follow-up in the office and imaging showed disconnection of a rod from her connector into her previous construct.  CT scan was ordered obtained show good position of the implants except for the disconnected rod so I recommended reexploration of her lumbar fusion wound and replacement reattachment of spinal fixation device in this instance rod into a globus addition connector.  I extensively went over the risks and benefits of the operation with her as well as perioperative course expectations of outcome and alternatives to surgery and she understood and agreed to proceed forward.  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    Shingles     Past Surgical History:  Procedure Laterality Date   BREAST BIOPSY Left 09/30/2002   Ductogram-neg   INSERT / REPLACE / REMOVE PACEMAKER  04/28/1995   Permanent pacemaker, Dr. [name] (medtronic)   Lobe hemithroidectomy     Right   LUMBAR FUSION  2004   w/ Dr. Wynetta Emery   LUMBAR LAMINECTOMY  1989   SPINAL FUSION  01/09/2023   L3 and L4, L5 and L6   TOTAL ABDOMINAL HYSTERECTOMY  2009   still has 1 ovary    Family History  Problem Relation Age of Onset    Rheum arthritis Mother    Asthma Mother    Heart disease Father        pacemaker,arrythmias   Lung disease Father    COPD Maternal Grandmother    Breast cancer Maternal Grandmother    Heart disease Maternal Grandfather    Liver disease Paternal Grandmother    Heart disease Paternal Grandfather    Asthma Other    Coronary artery disease Other        Female 1st degree relative <50   Lung cancer Other    Prostate cancer Other        1st degree relative <50   Social History:  reports that she has never smoked. She has been exposed to tobacco smoke. She has never used smokeless tobacco. She reports that she does not drink alcohol and does not use drugs.  Allergies:  Allergies  Allergen Reactions   Augmentin [Amoxicillin-Pot Clavulanate] Diarrhea and Nausea And Vomiting    Mostly Nausea and Upset Stomach    Morphine And Codeine Nausea And Vomiting    vomiting    Medications Prior to Admission  Medication Sig Dispense Refill   atorvastatin (LIPITOR) 40 MG tablet Take 1 tablet (40 mg total) by mouth at bedtime. OFFICE VISIT NEEDED FOR ADDITIONAL REFILLS 90 tablet 0   buPROPion (WELLBUTRIN XL) 150 MG 24 hr tablet Take 1 tablet (150 mg total)  by mouth daily. 90 tablet 0   buPROPion (WELLBUTRIN XL) 300 MG 24 hr tablet Take 1 tablet (300 mg total) by mouth daily. 90 tablet 0   furosemide (LASIX) 20 MG tablet Take 1 tablet (20 mg total) by mouth daily as needed. OFFICE VISIT NEEDED FOR ADDITIONAL REFILLS 90 tablet 0   gabapentin (NEURONTIN) 300 MG capsule Take 1 capsule (300 mg total) by mouth at bedtime. 90 capsule 0   HYDROcodone-acetaminophen (NORCO/VICODIN) 5-325 MG tablet Take 1-2 tablets by mouth every 4 (four) hours as needed for severe pain (pain score 7-10) 30 tablet 0   methocarbamol (ROBAXIN) 500 MG tablet Take 1 tablet (500 mg total) by mouth 4 (four) times daily. (Patient taking differently: Take 500 mg by mouth 4 (four) times daily as needed for muscle spasms.) 45 tablet 0    methocarbamol (ROBAXIN) 500 MG tablet Take 1 tablet (500 mg total) by mouth 4 (four) times daily as needed for muscle spasms (Spasms). 40 tablet 1   oxyCODONE-acetaminophen (PERCOCET) 10-325 MG tablet Take 1 tablet by mouth every 6 (six) hours as needed. 45 tablet 0   sertraline (ZOLOFT) 50 MG tablet Take 1 tablet (50 mg total) by mouth daily. 90 tablet 0   traMADol (ULTRAM) 50 MG tablet Take 1 tablet (50 mg total) by mouth every 6 (six) hours as needed. 45 tablet 0   traZODone (DESYREL) 100 MG tablet Take 2 tablets (200 mg total) by mouth at bedtime. 180 tablet 0   albuterol (VENTOLIN HFA) 108 (90 Base) MCG/ACT inhaler Inhale 2 puffs into the lungs every 6 (six) hours as needed for wheezing or shortness of breath. 18 g 2   budesonide-formoterol (SYMBICORT) 80-4.5 MCG/ACT inhaler Inhale 2 puffs into the lungs 2 (two) times daily. (Patient not taking: Reported on 01/28/2023) 10.2 g 2   guselkumab (TREMFYA) 100 MG/ML pen Inject 100 mg into the skin every 8 weeks 1 mL 2   methylPREDNISolone (MEDROL DOSEPAK) 4 MG TBPK tablet Take as directed 21 tablet 0   rizatriptan (MAXALT) 10 MG tablet Take 1 tablet (10 mg total) by mouth as needed for migraine. May repeat in 2 hours if needed 10 tablet 2    Results for orders placed or performed during the hospital encounter of 07/20/23 (from the past 48 hour(s))  Type and screen [facility]     Status: None   Collection Time: 07/20/23  2:40 PM  Result Value Ref Range   ABO/RH(D) A POS    Antibody Screen NEG    Sample Expiration      07/23/2023,2359 Performed at [facility], [address], [place], [place] [address]    No results found.  Review of Systems  Musculoskeletal:  Positive for back pain.  Neurological:  Positive for weakness.    There were no vitals taken for this visit. Physical Exam HENT:     Head: Normocephalic.     Right Ear: Tympanic membrane normal.     Nose: Nose normal.     Mouth/Throat:     Mouth: Mucous  membranes are moist.  Eyes:     Pupils: Pupils are equal, round, and reactive to light.  Cardiovascular:     Rate and Rhythm: Normal rate.     Pulses: Normal pulses.  Pulmonary:     Effort: Pulmonary effort is normal.  Abdominal:     General: Abdomen is flat.  Musculoskeletal:        General: Normal range of motion.  Cervical back: Normal range of motion.  Skin:    General: Skin is warm.  Neurological:     Mental Status: She is alert.     Comments: Strength is 5 out of 5 iliopsoas, quads, hamstrings, gastrocs, into tibialis, and EHL.      Assessment/Plan 57 year old female presents for reexploration of lumbar wound for repositioning of spinal fixation device  Mariam Dollar, MD 07/20/2023, 5:47 PM

## 2023-07-20 NOTE — Anesthesia Procedure Notes (Signed)
Procedure Name: Intubation Date/Time: 07/20/2023 6:47 PM  Performed by: Camillia Herter, CRNAPre-anesthesia Checklist: Patient identified, Emergency Drugs available, Suction available and Patient being monitored Patient Re-evaluated:Patient Re-evaluated prior to induction Oxygen Delivery Method: Circle System Utilized Preoxygenation: Pre-oxygenation with 100% oxygen Induction Type: IV induction Ventilation: Mask ventilation without difficulty Laryngoscope Size: Mac and 3 Grade View: Grade I Tube type: Oral Tube size: 7.0 mm Number of attempts: 1 Airway Equipment and Method: Stylet and Oral airway Placement Confirmation: ETT inserted through vocal cords under direct vision, positive ETCO2 and breath sounds checked- equal and bilateral Secured at: 22 cm Tube secured with: Tape Dental Injury: Teeth and Oropharynx as per pre-operative assessment  Comments: Atraumatic intubation, soft bite block placed prior to turning prone.

## 2023-07-20 NOTE — Transfer of Care (Signed)
Immediate Anesthesia Transfer of Care Note  Patient: Emma Rogers  Procedure(s) Performed: Revision of spinal fusion left side with globus addition set (Left: Back)  Patient Location: PACU  Anesthesia Type:General  Level of Consciousness: awake, alert , and oriented  Airway & Oxygen Therapy: Patient Spontanous Breathing  Post-op Assessment: Report given to RN and Post -op Vital signs reviewed and stable  Post vital signs: Reviewed and stable  Last Vitals:  Vitals Value Taken Time  BP 144/80 07/20/23 2035  Temp 36.3 C 07/20/23 2020  Pulse 67 07/20/23 2039  Resp 12 07/20/23 2039  SpO2 95 % 07/20/23 2039  Vitals shown include unfiled device data.  Last Pain:  Vitals:   07/20/23 2035  PainSc: 8       Patients Stated Pain Goal: 2 (07/20/23 2035)  Complications: No notable events documented.

## 2023-07-20 NOTE — Addendum Note (Signed)
Addendum  created 07/20/23 2144 by Camillia Herter, CRNA   Intraprocedure Event deleted, Intraprocedure Event edited

## 2023-07-21 ENCOUNTER — Other Ambulatory Visit: Payer: Self-pay

## 2023-07-21 ENCOUNTER — Encounter (HOSPITAL_COMMUNITY): Payer: Self-pay | Admitting: Neurosurgery

## 2023-07-21 ENCOUNTER — Encounter: Payer: Commercial Managed Care - PPO | Admitting: Physical Therapy

## 2023-07-21 DIAGNOSIS — Z7722 Contact with and (suspected) exposure to environmental tobacco smoke (acute) (chronic): Secondary | ICD-10-CM | POA: Diagnosis not present

## 2023-07-21 DIAGNOSIS — Z981 Arthrodesis status: Secondary | ICD-10-CM | POA: Diagnosis not present

## 2023-07-21 DIAGNOSIS — Z955 Presence of coronary angioplasty implant and graft: Secondary | ICD-10-CM | POA: Diagnosis not present

## 2023-07-21 DIAGNOSIS — T84226A Displacement of internal fixation device of vertebrae, initial encounter: Secondary | ICD-10-CM | POA: Diagnosis not present

## 2023-07-21 DIAGNOSIS — J449 Chronic obstructive pulmonary disease, unspecified: Secondary | ICD-10-CM | POA: Diagnosis not present

## 2023-07-21 MED ORDER — METHOCARBAMOL 500 MG PO TABS
500.0000 mg | ORAL_TABLET | Freq: Four times a day (QID) | ORAL | 1 refills | Status: DC | PRN
  Filled 2023-07-21: qty 40, 10d supply, fill #0

## 2023-07-21 MED ORDER — HYDROCODONE-ACETAMINOPHEN 5-325 MG PO TABS
1.0000 | ORAL_TABLET | ORAL | 0 refills | Status: DC | PRN
  Filled 2023-07-21: qty 30, 3d supply, fill #0

## 2023-07-21 NOTE — Discharge Summary (Signed)
Physician Discharge Summary  Patient ID: Emma Rogers MRN: [id number] DOB/AGE: [birth year] 56 y.o.  Admit date: 07/20/2023 Discharge date: 07/21/2023  Admission Diagnoses:  Malpositioned hardware    Discharge Diagnoses: same   Discharged Condition: good  Hospital Course: The patient was admitted on 07/20/2023 and taken to the operating room where the patient underwent reexploration of fusion with hardware replacement. The patient tolerated the procedure well and was taken to the recovery room and then to the floor in stable condition. The hospital course was routine. There were no complications. The wound remained clean dry and intact. Pt had appropriate back soreness. No complaints of leg pain or new N/T/W. The patient remained afebrile with stable vital signs, and tolerated a regular diet. The patient continued to increase activities, and pain was well controlled with oral pain medications.   Consults: None  Significant Diagnostic Studies:  Results for orders placed or performed during the hospital encounter of 07/20/23  Type and screen [facility]  Result Value Ref Range   ABO/RH(D) A POS    Antibody Screen NEG    Sample Expiration      07/23/2023,2359 Performed at [facility], [address] [place], [place] [address]     DG Lumbar Spine 2-3 Views  Result Date: 07/08/2023 CLINICAL DATA:  L2-L3 PLIF EXAM: LUMBAR SPINE - 2-3 VIEW; DG C-ARM 1-60 MIN-NO REPORT COMPARISON:  Lumbar spine CT 04/28/2023 FINDINGS: Two C-arm fluoroscopic images were obtained intraoperatively and submitted for post operative interpretation. The provided image demonstrates pedicle screws at what are presumed to be the L2, L3, and L4 levels with interbody spacers at L2-L3 and L3-L4. No interlocking rod is present at L2-L3 or L3-L4 at the time of imaging. Fluoro time 25.6 seconds, dose 20.9 mGy. Please see the performing provider's procedural report for further detail. IMPRESSION:  Intraoperative images during lumbar spine fusion as above. Electronically Signed   By: Lesia Hausen M.D.   On: 07/08/2023 15:02   DG C-Arm 1-60 Min-No Report  Result Date: 07/08/2023 Fluoroscopy was utilized by the requesting physician.  No radiographic interpretation.    Antibiotics:  Anti-infectives (From admission, onward)    Start     Dose/Rate Route Frequency Ordered Stop   07/21/23 0100  ceFAZolin (ANCEF) IVPB 2g/100 mL premix        2 g 200 mL/hr over 30 Minutes Intravenous Every 8 hours 07/20/23 2217 07/21/23 0626   07/20/23 2245  ceFAZolin (ANCEF) IVPB 2g/100 mL premix  Status:  Discontinued        2 g 200 mL/hr over 30 Minutes Intravenous Every 8 hours 07/20/23 2155 07/20/23 2217   07/20/23 1630  ceFAZolin (ANCEF) IVPB 2g/100 mL premix        2 g 200 mL/hr over 30 Minutes Intravenous  Once 07/20/23 1626 07/20/23 1855   07/20/23 1455  ceFAZolin (ANCEF) 2-4 GM/100ML-% IVPB       Note to Pharmacy: Samuella Cota O: cabinet override      07/20/23 1455 07/20/23 2038       Discharge Exam: Blood pressure 128/68, pulse 68, temperature 98.2 F (36.8 C), temperature source Oral, resp. rate 18, SpO2 97%. Neurologic: Grossly normal Ambulating and vodiing well incision cdi  Discharge Medications:   Allergies as of 07/21/2023       Reactions   Augmentin [amoxicillin-pot Clavulanate] Diarrhea, Nausea And Vomiting   Mostly Nausea and Upset Stomach    Morphine And Codeine Nausea And Vomiting   vomiting  Medication List     STOP taking these medications    methylPREDNISolone 4 MG Tbpk tablet Commonly known as: MEDROL DOSEPAK   oxyCODONE-acetaminophen 10-325 MG tablet Commonly known as: PERCOCET       TAKE these medications    albuterol 108 (90 Base) MCG/ACT inhaler Commonly known as: VENTOLIN HFA Inhale 2 puffs into the lungs every 6 (six) hours as needed for wheezing or shortness of breath.   atorvastatin 40 MG tablet Commonly known as: LIPITOR Take 1  tablet (40 mg total) by mouth at bedtime. OFFICE VISIT NEEDED FOR ADDITIONAL REFILLS   buPROPion 300 MG 24 hr tablet Commonly known as: WELLBUTRIN XL Take 1 tablet (300 mg total) by mouth daily.   buPROPion 150 MG 24 hr tablet Commonly known as: Wellbutrin XL Take 1 tablet (150 mg total) by mouth daily.   furosemide 20 MG tablet Commonly known as: LASIX Take 1 tablet (20 mg total) by mouth daily as needed. OFFICE VISIT NEEDED FOR ADDITIONAL REFILLS   gabapentin 300 MG capsule Commonly known as: NEURONTIN Take 1 capsule (300 mg total) by mouth at bedtime.   HYDROcodone-acetaminophen 5-325 MG tablet Commonly known as: NORCO/VICODIN Take 1-2 tablets by mouth every 4 (four) hours as needed for severe pain (pain score 7-10)   methocarbamol 500 MG tablet Commonly known as: ROBAXIN Take 1 tablet (500 mg total) by mouth 4 (four) times daily. What changed:  when to take this reasons to take this   methocarbamol 500 MG tablet Commonly known as: ROBAXIN Take 1 tablet (500 mg total) by mouth 4 (four) times daily as needed for muscle spasms (Spasms). What changed: Another medication with the same name was changed. Make sure you understand how and when to take each.   rizatriptan 10 MG tablet Commonly known as: MAXALT Take 1 tablet (10 mg total) by mouth as needed for migraine. May repeat in 2 hours if needed   sertraline 50 MG tablet Commonly known as: ZOLOFT Take 1 tablet (50 mg total) by mouth daily.   Symbicort 80-4.5 MCG/ACT inhaler Generic drug: budesonide-formoterol Inhale 2 puffs into the lungs 2 (two) times daily.   traMADol 50 MG tablet Commonly known as: ULTRAM Take 1 tablet (50 mg total) by mouth every 6 (six) hours as needed.   traZODone 100 MG tablet Commonly known as: DESYREL Take 2 tablets (200 mg total) by mouth at bedtime.   Tremfya 100 MG/ML pen Generic drug: guselkumab Inject 100 mg into the skin every 8 weeks        Disposition: home   Final Dx:  reexploration of hardware  Discharge Instructions      Remove dressing in 72 hours   Complete by: As directed    Call MD for:   Complete by: As directed    Call MD for:  difficulty breathing, headache or visual disturbances   Complete by: As directed    Call MD for:  hives   Complete by: As directed    Call MD for:  persistant dizziness or light-headedness   Complete by: As directed    Call MD for:  persistant nausea and vomiting   Complete by: As directed    Call MD for:  redness, tenderness, or signs of infection (pain, swelling, redness, odor or green/yellow discharge around incision site)   Complete by: As directed    Call MD for:  severe uncontrolled pain   Complete by: As directed    Call MD for:  temperature >100.4  Complete by: As directed    Diet - low sodium heart healthy   Complete by: As directed    Driving Restrictions   Complete by: As directed    No driving for 2 weeks, no riding in the car for 1 week   Increase activity slowly   Complete by: As directed    Lifting restrictions   Complete by: As directed    No lifting more than 8 lbs        Follow-up Information     Donalee Citrin, MD. Call.   Specialty: Neurosurgery Why: As needed, If symptoms worsen Contact information: [address] [place] [address] [phone]                  Signed: Tiana Loft [facility] 07/21/2023, 8:05 AM

## 2023-07-21 NOTE — Progress Notes (Signed)
Patient alert and oriented, mae's well, voiding adequate amount of urine, swallowing without difficulty, no c/o pain at time of discharge. Patient discharged home with spouse. Script and discharged instructions given to patient. Patient and husband stated understanding of instructions given. Patient has an appointment with Dr. Wynetta Emery

## 2023-07-21 NOTE — Evaluation (Signed)
Occupational Therapy Evaluation Patient Details Name: Emma Rogers MRN: [id number] DOB: [birth year] Today's Date: 07/21/2023   History of Present Illness Emma Rogers is a 57 yo female who underwent L2-3 posterior lumbar interbody fusion extension 10/28. Recent PLIF 10/17. PMH includes: anxiety, asthma, pacemaker, arthritis, prior back surgery.   Clinical Impression   Emma Rogers was evaluated s/p the above spine surgery. She is is typically mod I at baseline, however she was needing assist for ADLs and ambulating with a RW since recent d/c from PLIF sx due to RLE pain and weakness. Upon evaluation pt was limited by spinal precautions and mild surgical pain. Overall she was mod I for ADLs and mobility with SPC. Pt reports no pain in BLEs, strength is [facility] and pt is steady. Provided cues and education on spinal precautions and compensatory techniques throughout, handout provided and pt demonstrated great recall during ADLs and mobility. Pt does not require further acute OT services. Recommend d/c home with support of family.         If plan is discharge home, recommend the following: Assistance with cooking/housework;Assist for transportation    Functional Status Assessment  Patient has had a recent decline in their functional status and demonstrates the ability to make significant improvements in function in a reasonable and predictable amount of time.  Equipment Recommendations  None recommended by OT       Precautions / Restrictions Precautions Precautions: Back Precaution Booklet Issued: Yes (comment) Restrictions Weight Bearing Restrictions: No      Mobility Bed Mobility Overal bed mobility: Modified Independent             General bed mobility comments: reviewed log roll    Transfers Overall transfer level: Modified independent                        Balance Overall balance assessment: No apparent balance deficits (not formally assessed)           ADL either  performed or assessed with clinical judgement   ADL Overall ADL's : Modified independent           General ADL Comments: mod I after cues for BLT spinal precuaitons; pt reports feeling much better post-op with no LE pain/weakness. Able to get into figure four position, but she also has a Sports administrator at home.     Vision Baseline Vision/History: 1 Wears glasses Vision Assessment?: No apparent visual deficits     Perception Perception: Within Functional Limits       Praxis Praxis: WFL       Pertinent Vitals/Pain Pain Assessment Pain Assessment: Faces Faces Pain Scale: Hurts a little bit Pain Location: surgical pain Pain Descriptors / Indicators: Sore Pain Intervention(s): Limited activity within patient's tolerance, Monitored during session     Extremity/Trunk Assessment Upper Extremity Assessment Upper Extremity Assessment: Overall WFL for tasks assessed   Lower Extremity Assessment Lower Extremity Assessment: Overall WFL for tasks assessed   Cervical / Trunk Assessment Cervical / Trunk Assessment: Back Surgery   Communication Communication Communication: No apparent difficulties   Cognition Arousal: Alert Behavior During Therapy: WFL for tasks assessed/performed Overall Cognitive Status: Within Functional Limits for tasks assessed             General Comments  VSS. husband present            Home Living Family/patient expects to be discharged to:: Private residence Living Arrangements: Spouse/significant other Available Help at Discharge: Family;Available PRN/intermittently Type of Home: House Home Access:  Stairs to enter [employer] of Steps: 4 Entrance Stairs-Rails: Left Home Layout: One level     Bathroom Shower/Tub: Chief Strategy Officer: Handicapped height Bathroom Accessibility: Yes How Accessible: Accessible via walker Home Equipment: Cane - single point;Shower Counsellor (2 wheels);Adaptive  equipment Adaptive Equipment: Reacher        Prior Functioning/Environment Prior Level of Function : Independent/Modified Independent             Mobility Comments: RW since recent d/c from PLIF ADLs Comments: assist from husband dur to pain and LLE weakness after recent PLIF              OT Treatment/Interventions: Self-care/ADL training;Therapeutic activities    OT Goals(Current goals can be found in the care plan section) Acute Rehab OT Goals Patient Stated Goal: to go home OT Goal Formulation: With patient Time For Goal Achievement: 07/21/23 Potential to Achieve Goals: Good  OT Frequency: Min 1X/week       AM-PAC OT "6 Clicks" Daily Activity     Outcome Measure Help from another person eating meals?: None Help from another person taking care of personal grooming?: None Help from another person toileting, which includes using toliet, bedpan, or urinal?: None Help from another person bathing (including washing, rinsing, drying)?: None Help from another person to put on and taking off regular upper body clothing?: None Help from another person to put on and taking off regular lower body clothing?: None 6 Click Score: 24   End of Session Equipment Utilized During Treatment: Other (comment) [facility]) Nurse Communication: Mobility status  Activity Tolerance: Patient tolerated treatment well Patient left: in bed                   Time: 3244-0102 OT Time Calculation (min): 16 min Charges:  OT General Charges $OT Visit: 1 Visit OT Evaluation $OT Eval Low Complexity: 1 Low  Derenda Mis, OTR/L Acute Rehabilitation Services Office [phone] Secure Chat Communication Preferred   Donia Pounds 07/21/2023, 9:08 AM

## 2023-07-21 NOTE — Plan of Care (Signed)
  Problem: Education: Goal: Knowledge of General Education information will improve Description: Including pain rating scale, medication(s)/side effects and non-pharmacologic comfort measures Outcome: Completed/Met   Problem: Health Behavior/Discharge Planning: Goal: Ability to manage health-related needs will improve Outcome: Completed/Met   Problem: Clinical Measurements: Goal: Ability to maintain clinical measurements within normal limits will improve Outcome: Completed/Met Goal: Will remain free from infection Outcome: Completed/Met Goal: Diagnostic test results will improve Outcome: Completed/Met Goal: Respiratory complications will improve Outcome: Completed/Met Goal: Cardiovascular complication will be avoided Outcome: Completed/Met   Problem: Activity: Goal: Risk for activity intolerance will decrease Outcome: Completed/Met   Problem: Nutrition: Goal: Adequate nutrition will be maintained Outcome: Completed/Met   Problem: Coping: Goal: Level of anxiety will decrease Outcome: Completed/Met   Problem: Elimination: Goal: Will not experience complications related to bowel motility Outcome: Completed/Met Goal: Will not experience complications related to urinary retention Outcome: Completed/Met   Problem: Pain Management: Goal: General experience of comfort will improve Outcome: Completed/Met   Problem: Safety: Goal: Ability to remain free from injury will improve Outcome: Completed/Met   Problem: Skin Integrity: Goal: Risk for impaired skin integrity will decrease Outcome: Completed/Met   Problem: Education: Goal: Ability to verbalize activity precautions or restrictions will improve Outcome: Completed/Met Goal: Knowledge of the prescribed therapeutic regimen will improve Outcome: Completed/Met Goal: Understanding of discharge needs will improve Outcome: Completed/Met   Problem: Activity: Goal: Ability to avoid complications of mobility impairment will  improve Outcome: Completed/Met Goal: Ability to tolerate increased activity will improve Outcome: Completed/Met Goal: Will remain free from falls Outcome: Completed/Met   Problem: Bowel/Gastric: Goal: Gastrointestinal status for postoperative course will improve Outcome: Completed/Met   Problem: Clinical Measurements: Goal: Ability to maintain clinical measurements within normal limits will improve Outcome: Completed/Met Goal: Postoperative complications will be avoided or minimized Outcome: Completed/Met Goal: Diagnostic test results will improve Outcome: Completed/Met   Problem: Pain Management: Goal: Pain level will decrease Outcome: Completed/Met   Problem: Skin Integrity: Goal: Will show signs of wound healing Outcome: Completed/Met   Problem: Health Behavior/Discharge Planning: Goal: Identification of resources available to assist in meeting health care needs will improve Outcome: Completed/Met   Problem: Bladder/Genitourinary: Goal: Urinary functional status for postoperative course will improve Outcome: Completed/Met

## 2023-07-23 ENCOUNTER — Encounter: Payer: Commercial Managed Care - PPO | Admitting: Physical Therapy

## 2023-07-28 ENCOUNTER — Ambulatory Visit
Admission: RE | Admit: 2023-07-28 | Discharge: 2023-07-28 | Disposition: A | Discharge status: Home / Self Care | Payer: Commercial Managed Care - PPO | Source: Ambulatory Visit | Attending: Family Medicine

## 2023-07-28 ENCOUNTER — Other Ambulatory Visit: Payer: Self-pay | Admitting: Family Medicine

## 2023-07-28 ENCOUNTER — Ambulatory Visit
Admission: RE | Admit: 2023-07-28 | Discharge: 2023-07-28 | Disposition: A | Discharge status: Home / Self Care | Payer: Commercial Managed Care - PPO | Source: Ambulatory Visit | Attending: Family Medicine | Admitting: Family Medicine

## 2023-07-28 DIAGNOSIS — N183 Chronic kidney disease, stage 3 unspecified: Secondary | ICD-10-CM

## 2023-07-28 DIAGNOSIS — Z0000 Encounter for general adult medical examination without abnormal findings: Secondary | ICD-10-CM

## 2023-07-28 DIAGNOSIS — R2232 Localized swelling, mass and lump, left upper limb: Secondary | ICD-10-CM

## 2023-07-28 DIAGNOSIS — F331 Major depressive disorder, recurrent, moderate: Secondary | ICD-10-CM

## 2023-07-28 DIAGNOSIS — F411 Generalized anxiety disorder: Secondary | ICD-10-CM

## 2023-07-28 DIAGNOSIS — E7849 Other hyperlipidemia: Secondary | ICD-10-CM

## 2023-07-28 DIAGNOSIS — E538 Deficiency of other specified B group vitamins: Secondary | ICD-10-CM

## 2023-07-28 DIAGNOSIS — N6320 Unspecified lump in the left breast, unspecified quadrant: Secondary | ICD-10-CM | POA: Diagnosis not present

## 2023-07-28 DIAGNOSIS — E559 Vitamin D deficiency, unspecified: Secondary | ICD-10-CM

## 2023-07-28 DIAGNOSIS — Z23 Encounter for immunization: Secondary | ICD-10-CM

## 2023-07-28 DIAGNOSIS — R92333 Mammographic heterogeneous density, bilateral breasts: Secondary | ICD-10-CM | POA: Diagnosis not present

## 2023-07-28 DIAGNOSIS — R7303 Prediabetes: Secondary | ICD-10-CM

## 2023-08-04 ENCOUNTER — Other Ambulatory Visit: Payer: Commercial Managed Care - PPO

## 2023-08-18 ENCOUNTER — Other Ambulatory Visit: Payer: Self-pay

## 2023-08-18 ENCOUNTER — Other Ambulatory Visit: Payer: Self-pay | Admitting: Family Medicine

## 2023-08-19 ENCOUNTER — Other Ambulatory Visit: Payer: Self-pay | Admitting: Family Medicine

## 2023-08-19 ENCOUNTER — Other Ambulatory Visit: Payer: Self-pay

## 2023-08-19 MED ORDER — GABAPENTIN 300 MG PO CAPS
300.0000 mg | ORAL_CAPSULE | Freq: Every day | ORAL | 0 refills | Status: DC
  Filled 2023-08-19: qty 90, 90d supply, fill #0

## 2023-08-19 MED ORDER — SERTRALINE HCL 50 MG PO TABS
50.0000 mg | ORAL_TABLET | Freq: Every day | ORAL | 0 refills | Status: DC
  Filled 2023-08-19: qty 90, 90d supply, fill #0

## 2023-08-19 MED ORDER — ATORVASTATIN CALCIUM 40 MG PO TABS
40.0000 mg | ORAL_TABLET | Freq: Every day | ORAL | 0 refills | Status: DC
  Filled 2023-08-19: qty 90, 90d supply, fill #0

## 2023-08-19 MED FILL — Bupropion HCl Tab ER 24HR 150 MG: ORAL | 90 days supply | Qty: 90 | Fill #0 | Status: AC

## 2023-08-19 MED FILL — Rizatriptan Benzoate Tab 10 MG (Base Equivalent): ORAL | 30 days supply | Qty: 10 | Fill #1 | Status: AC

## 2023-08-19 MED FILL — Bupropion HCl Tab ER 24HR 300 MG: ORAL | 90 days supply | Qty: 90 | Fill #0 | Status: AC

## 2023-08-19 NOTE — Telephone Encounter (Signed)
Requested Prescriptions  Pending Prescriptions Disp Refills   gabapentin (NEURONTIN) 300 MG capsule 90 capsule 0    Sig: Take 1 capsule (300 mg total) by mouth at bedtime.     Neurology: Anticonvulsants - gabapentin Failed - 08/19/2023 12:13 PM      Failed - Cr in normal range and within 360 days    Creat  Date Value Ref Range Status  03/30/2023 0.95 0.50 - 1.03 mg/dL Final   Creatinine, Ser  Date Value Ref Range Status  07/07/2023 1.16 (H) 0.57 - 1.00 mg/dL Final         Passed - Completed PHQ-2 or PHQ-9 in the last 360 days      Passed - Valid encounter within last 12 months    Recent Outpatient Visits           1 month ago Routine general medical examination at a health care facility   [facility], Megan P, DO   6 months ago Dizziness   [facility] [place], Megan P, DO   11 months ago Acute left-sided low back pain with left-sided sciatica   [facility] [place], [place] P, DO   1 year ago Acute left-sided low back pain with left-sided sciatica   [place] [facility] [place], Megan P, DO   1 year ago Left hip pain   [place] [facility] Mecum, Oswaldo Conroy, PA-C       Future Appointments             In 1 month Rice, Jamesetta Orleans, MD [facility] Rheumatology - A Dept Of [facility]   In 4 months Johnson, Oralia Rud, DO  [facility]             atorvastatin (LIPITOR) 40 MG tablet 90 tablet 0    Sig: Take 1 tablet (40 mg total) by mouth at bedtime. OFFICE VISIT NEEDED FOR ADDITIONAL REFILLS     Cardiovascular:  Antilipid - Statins Failed - 08/19/2023 12:13 PM      Failed - Lipid Panel in normal range within the last 12 months    Cholesterol, Total  Date Value Ref Range Status  06/30/2023 221 (H) 100 - 199 mg/dL Final   LDL Cholesterol (Calc)  Date Value Ref Range Status  11/01/2018 162 (H) mg/dL (calc) Final     Comment:    Reference range: <100 . Desirable range <100 mg/dL for primary prevention;   <70 mg/dL for patients with CHD or diabetic patients  with > or = 2 CHD risk factors. Marland Kitchen LDL-C is now calculated using the Martin-Hopkins  calculation, which is a validated novel method providing  better accuracy than the Friedewald equation in the  estimation of LDL-C.  Horald Pollen et al. Lenox Ahr. 1610;960(45): 2061-2068  (http://education.Quest[employer].com/faq/FAQ164)    LDL Chol Calc (NIH)  Date Value Ref Range Status  06/30/2023 120 (H) 0 - 99 mg/dL Final   Direct LDL  Date Value Ref Range Status  02/13/2012 178.3 mg/dL Final    Comment:    Optimal:  <100 mg/dLNear or Above Optimal:  100-129 mg/dLBorderline High:  130-159 mg/dLHigh:  160-189 mg/dLVery High:  >190 mg/dL   HDL  Date Value Ref Range Status  06/30/2023 63 >39 mg/dL Final   Triglycerides  Date Value Ref Range Status  06/30/2023 220 (H) 0 - 149 mg/dL Final         Passed -  Patient is not pregnant      Passed - Valid encounter within last 12 months    Recent Outpatient Visits           1 month ago Routine general medical examination at a health care facility   [facility], [place] P, DO   6 months ago Dizziness   [place] [facility] [place], Megan P, DO   11 months ago Acute left-sided low back pain with left-sided sciatica   [place] [facility] [place], [place] P, DO   1 year ago Acute left-sided low back pain with left-sided sciatica   [place] [facility] [place], Megan P, DO   1 year ago Left hip pain   [place] [facility] Mecum, Oswaldo Conroy, PA-C       Future Appointments             In 1 month Rice, Jamesetta Orleans, MD [facility] Health Rheumatology - A Dept Of [place]   In 4 months Johnson, Oralia Rud, DO [place] [facility]             sertraline (ZOLOFT) 50 MG tablet  90 tablet 0    Sig: Take 1 tablet (50 mg total) by mouth daily.     Psychiatry:  Antidepressants - SSRI - sertraline Passed - 08/19/2023 12:13 PM      Passed - AST in normal range and within 360 days    AST  Date Value Ref Range Status  06/30/2023 19 0 - 40 IU/L Final         Passed - ALT in normal range and within 360 days    ALT  Date Value Ref Range Status  06/30/2023 18 0 - 32 IU/L Final         Passed - Completed PHQ-2 or PHQ-9 in the last 360 days      Passed - Valid encounter within last 6 months    Recent Outpatient Visits           1 month ago Routine general medical examination at a health care facility   [facility], Megan P, DO   6 months ago Dizziness   [place] [facility] [place], Megan P, DO   11 months ago Acute left-sided low back pain with left-sided sciatica   [place] [facility] [place], Megan P, DO   1 year ago Acute left-sided low back pain with left-sided sciatica   [facility] [place], Megan P, DO   1 year ago Left hip pain   [place] [facility] Mecum, Oswaldo Conroy, PA-C       Future Appointments             In 1 month Rice, Jamesetta Orleans, MD [facility] Rheumatology - A Dept Of . [facility]   In 4 months Laural Benes, Oralia Rud, DO [place] [facility], [facility]

## 2023-08-19 NOTE — Telephone Encounter (Signed)
Requested Prescriptions  Pending Prescriptions Disp Refills   buPROPion (WELLBUTRIN XL) 150 MG 24 hr tablet 90 tablet 0    Sig: Take 1 tablet (150 mg total) by mouth daily.     Psychiatry: Antidepressants - bupropion Failed - 08/18/2023  5:58 PM      Failed - Cr in normal range and within 360 days    Creat  Date Value Ref Range Status  03/30/2023 0.95 0.50 - 1.03 mg/dL Final   Creatinine, Ser  Date Value Ref Range Status  07/07/2023 1.16 (H) 0.57 - 1.00 mg/dL Final         Passed - AST in normal range and within 360 days    AST  Date Value Ref Range Status  06/30/2023 19 0 - 40 IU/L Final         Passed - ALT in normal range and within 360 days    ALT  Date Value Ref Range Status  06/30/2023 18 0 - 32 IU/L Final         Passed - Completed PHQ-2 or PHQ-9 in the last 360 days      Passed - Last BP in normal range    BP Readings from Last 1 Encounters:  07/21/23 121/68         Passed - Valid encounter within last 6 months    Recent Outpatient Visits           1 month ago Routine general medical examination at a health care facility   [facility], Megan P, DO   6 months ago Dizziness   [place] [facility] [place], Megan P, DO   11 months ago Acute left-sided low back pain with left-sided sciatica   [place] [facility] [place], [place] P, DO   1 year ago Acute left-sided low back pain with left-sided sciatica   [place] [place], Megan P, DO   1 year ago Left hip pain   [place] [facility] Mecum, Oswaldo Conroy, PA-C       Future Appointments             In 1 month Rice, Jamesetta Orleans, MD [facility] Rheumatology - A Dept Of . [facility]   In 4 months Johnson, Oralia Rud, DO  [facility]             buPROPion (WELLBUTRIN XL) 300 MG 24 hr tablet 90 tablet 0    Sig: Take 1 tablet (300 mg total) by mouth daily.      Psychiatry: Antidepressants - bupropion Failed - 08/18/2023  5:58 PM      Failed - Cr in normal range and within 360 days    Creat  Date Value Ref Range Status  03/30/2023 0.95 0.50 - 1.03 mg/dL Final   Creatinine, Ser  Date Value Ref Range Status  07/07/2023 1.16 (H) 0.57 - 1.00 mg/dL Final         Passed - AST in normal range and within 360 days    AST  Date Value Ref Range Status  06/30/2023 19 0 - 40 IU/L Final         Passed - ALT in normal range and within 360 days    ALT  Date Value Ref Range Status  06/30/2023 18 0 - 32 IU/L Final         Passed - Completed PHQ-2 or PHQ-9 in the last 360  days      Passed - Last BP in normal range    BP Readings from Last 1 Encounters:  07/21/23 121/68         Passed - Valid encounter within last 6 months    Recent Outpatient Visits           1 month ago Routine general medical examination at a health care facility   [facility], [place] P, DO   6 months ago Dizziness   [place] [facility] [place], Megan P, DO   11 months ago Acute left-sided low back pain with left-sided sciatica   [place] [place], [place] P, DO   1 year ago Acute left-sided low back pain with left-sided sciatica   [facility] [place], Megan P, DO   1 year ago Left hip pain   [place] [facility] Mecum, Oswaldo Conroy, PA-C       Future Appointments             In 1 month Rice, Jamesetta Orleans, MD [facility] Health Rheumatology - A Dept Of [place]. [facility]   In 4 months Laural Benes, Oralia Rud, DO [facility]

## 2023-08-27 ENCOUNTER — Other Ambulatory Visit: Payer: Self-pay | Admitting: Family Medicine

## 2023-08-27 DIAGNOSIS — M48062 Spinal stenosis, lumbar region with neurogenic claudication: Secondary | ICD-10-CM | POA: Diagnosis not present

## 2023-08-28 ENCOUNTER — Other Ambulatory Visit: Payer: Self-pay | Admitting: Family Medicine

## 2023-08-28 ENCOUNTER — Other Ambulatory Visit: Payer: Self-pay

## 2023-08-31 ENCOUNTER — Other Ambulatory Visit: Payer: Self-pay

## 2023-08-31 MED FILL — Trazodone HCl Tab 100 MG: ORAL | 90 days supply | Qty: 180 | Fill #0 | Status: AC

## 2023-08-31 MED FILL — Trazodone HCl Tab 100 MG: ORAL | 90 days supply | Qty: 180 | Fill #0 | Status: CN

## 2023-08-31 NOTE — Telephone Encounter (Signed)
Requested Prescriptions  Pending Prescriptions Disp Refills   traZODone (DESYREL) 100 MG tablet 180 tablet 1    Sig: Take 2 tablets (200 mg total) by mouth at bedtime.     Psychiatry: Antidepressants - Serotonin Modulator Passed - 08/28/2023  9:57 AM      Passed - Completed PHQ-2 or PHQ-9 in the last 360 days      Passed - Valid encounter within last 6 months    Recent Outpatient Visits           2 months ago Routine general medical examination at a health care facility   [facility], [facility] P, DO   7 months ago Dizziness   [facility] [place], Megan P, DO   11 months ago Acute left-sided low back pain with left-sided sciatica   [place] [facility] [place], [place] P, DO   1 year ago Acute left-sided low back pain with left-sided sciatica   [place] [place], Megan P, DO   1 year ago Left hip pain   [place] [facility] Mecum, Oswaldo Conroy, PA-C       Future Appointments             In 1 month Rice, Jamesetta Orleans, MD [facility] Health Rheumatology - A Dept Of [place]. [facility]   In 4 months Laural Benes, Oralia Rud, DO  [facility], [facility]

## 2023-09-02 ENCOUNTER — Other Ambulatory Visit: Payer: Self-pay

## 2023-09-09 ENCOUNTER — Other Ambulatory Visit: Payer: Self-pay

## 2023-09-15 ENCOUNTER — Other Ambulatory Visit: Payer: Self-pay

## 2023-09-15 NOTE — Progress Notes (Signed)
Specialty Pharmacy Refill Coordination Note  Emma Rogers is a 57 y.o. female contacted today regarding refills of specialty medication(s) No data recorded  Patient requested (Patient-Rptd) Delivery   Delivery date: 09/18/23   Verified address: (Patient-Rptd) [address] [place], [place] [address]   Medication will be filled on 09/17/23.   Patient requested delivery 09/21/23, but we do not ship cold chain on Fridays. Sent patient a MyChart message with updated delivery date.

## 2023-09-17 ENCOUNTER — Other Ambulatory Visit: Payer: Self-pay

## 2023-09-17 NOTE — Progress Notes (Deleted)
 Office Visit Note  Patient: Emma Rogers             Date of Birth: October 28, 1965           MRN: [id number]             PCP: Dorcas Carrow, DO Referring: Dorcas Carrow, DO Visit Date: 09/30/2023   Subjective:  No chief complaint on file.   History of Present Illness: Emma Rogers is a 57 y.o. female here for follow up for psoriatic arthritis on tremfya.    Previous HPI 03/30/2023 Emma Rogers is a 57 y.o. female here for follow up for psoriatic arthritis on tremfya.  Since last visit she had surgery with Dr. Wynetta Emery for posterior lateral and interbody fusion of L3-L4.  So far still has ongoing low back pain since the surgery has been recovering.  She had several medications stopped including gabapentin and some of her supplements including fish oil recommended to optimize wound recovery for the initial 3 months which has another week or 2.  She has follow-up appointment scheduled with Dr. Wynetta Emery tomorrow.  Experienced some increased joint pain during this time especially more trouble affecting her thumbs worse on the right side and bilateral ankles.  These areas most often hurt worse with the use or with direct pressure.  Left ring finger swelling and stiffness most noticeable early in the mornings and has to remove jewelry overnight for this.  Did not experience any flareup or breakthrough of skin rash.   Previous HPI 09/23/22 Emma Rogers is a 57 y.o. female here for follow up for psoriatic arthritis on Tremfya. She had clearance of visible skin rashes. Joint pains are doing better outside of her low back symptoms. Shoulder and knee are doing okay. She has pain in her back since November with also increase in sciatica. Worst on the left as usual but also with some right sided symptoms more recently. She tried several treatments so far including hydrocodone, flexeril, prednisone taper without relief. Gabapentin at 100 mg and then 300 mg with slight benefit but waning. She is planned for  physical therapy after seeing emergeortho in [place].   Previous HPI 03/04/22 Emma Rogers is a 57 y.o. 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 Since last visit she has left sided low back of flank pain seen at the ED for this with imaging redemonstrated lower pole nephrolithiasis without change and was prescribed bactrim for UTI with urinary frequency. Still having back pain particularly at night and 1 hour of the morning.   Previous HPI 02/07/2022 Emma Rogers is a 57 y.o. 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 History of uveitis seen at [facility]. Eye inflammation started at least a year before any appreciable joint or skin inflammation changes. She previously suffered increased psoriasis activity on Humira and then lack of response to [employer]. She started Stelara with great improvement in skin disease but reports joint pains are only partially improved. Worst affected areas in her left shoulder, low back, and left knee. Morning stiffness still about 1 hour and back pain wakes her at night frequently. She had previous laminectomy L4-L5 fusion years ago with early DDD. She does not feel great relief with tylenol. Medical history also significant for CKD stage 3 unclear for the cause but has been stable.     Labs reviewed 01/2022 Quantiferon neg   11/2020 HBV neg HCV neg   No Rheumatology ROS completed.  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   Vertigo 06/16/2019   MDD (major depressive disorder), recurrent episode, moderate (HCC) 06/07/2019   GAD (generalized anxiety disorder) 05/26/2019   Memory deficit 04/26/2019   Chronic low back pain without sciatica 03/08/2019   Seizure-like activity (HCC) 03/08/2019   Moderate persistent asthma 01/27/2019   Cervical spinal stenosis 12/30/2018   Benign essential tremor 12/30/2018   Chronic venous insufficiency 03/10/2018   Lymphedema 03/10/2018   Familial hyperlipidemia 10/10/2017   Chronic kidney disease, stage III (moderate) (HCC) 09/03/2017   Vitamin D deficiency 09/03/2017   Vitamin B12 deficiency 09/03/2017   Insomnia 08/11/2017   Menopausal symptoms 08/11/2017   Migraine without aura and without status migrainosus, not intractable 02/21/2017   Prediabetes 02/20/2017   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 12/03/2009   VASOVAGAL SYNCOPE 11/27/2009   Allergic rhinitis 01/18/2008    Past Medical History:  Diagnosis Date   Abnormal MRI 04/03/2019   Allergic rhinitis    Anxiety    Asthma    Depression    Headache(784.0)    History of iritis 11/27/2020   History of kidney stones    Neurocardiogenic syncope    pt was having episodes of syncope prior to pacemaker placement, no issues now   PONV (postoperative nausea and vomiting)    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    Shingles     Family History  Problem Relation Age of Onset   Rheum arthritis Mother    Asthma Mother    Heart disease Father        pacemaker,arrythmias   Lung disease Father    COPD Maternal Grandmother    Breast cancer Maternal Grandmother    Heart disease Maternal Grandfather    Liver disease Paternal Grandmother    Heart disease Paternal Grandfather    Asthma Other    Coronary artery  disease Other        Female 1st degree relative <50   Lung cancer Other    Prostate cancer Other        1st degree relative <50   Past Surgical History:  Procedure Laterality Date   BREAST BIOPSY Left 09/30/2002   Ductogram-neg   INSERT / REPLACE / REMOVE PACEMAKER  04/28/1995   Permanent pacemaker, Dr. [name] (medtronic)   LAMINECTOMY WITH POSTERIOR LATERAL ARTHRODESIS LEVEL 1 Left 07/20/2023   Procedure: Revision of spinal fusion left side with globus addition set;  Surgeon: Donalee Citrin, MD;  Location: [facility] OR;  Service: Neurosurgery;  Laterality: Left;   Lobe hemithroidectomy     Right   LUMBAR FUSION  2004   w/ Dr. 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   Immunization History  Administered Date(s) Administered   Influenza Split 06/22/2013   Influenza, Seasonal, Injecte, Preservative Fre 06/30/2023   Influenza,inj,Quad PF,6+ Mos 07/15/2021, 07/22/2022   Influenza-Unspecified 07/17/2016, 06/24/2017, 02/06/2018, 07/07/2019, 06/25/2020   PFIZER(Purple Top)SARS-COV-2 Vaccination 12/30/2019, 01/20/2020   Tdap 06/30/2023   Zoster Recombinant(Shingrix) 12/24/2021     Objective: Vital Signs: There were no vitals taken for this visit.   Physical Exam   Musculoskeletal Exam: ***  CDAI Exam: CDAI Score: -- Patient Global: --; Provider Global: -- Swollen: --; Tender: -- Joint Exam 09/30/2023   No joint exam has been documented for this visit   There is currently no information documented on the homunculus. Go to the Rheumatology activity and complete the homunculus joint exam.  Investigation: No additional findings.  Imaging: No results found.  Recent Labs: Lab Results   Component Value Date   WBC 9.4 06/30/2023   HGB 14.6 06/30/2023   PLT 315 06/30/2023   NA 139 07/07/2023   K 4.2 07/07/2023   CL 101 07/07/2023   CO2 23 07/07/2023   GLUCOSE 110 (H) 07/07/2023   BUN 25 (H) 07/07/2023   CREATININE 1.16 (H) 07/07/2023   BILITOT 0.4 06/30/2023   ALKPHOS 76 06/30/2023   AST 19 06/30/2023   ALT 18 06/30/2023   PROT 6.9 06/30/2023   ALBUMIN 4.4 06/30/2023   CALCIUM 9.2 07/07/2023   GFRAA 76 01/12/2020   QFTBGOLDPLUS NEGATIVE 03/30/2023    Speciality Comments: No specialty comments available.  Procedures:  No procedures performed Allergies: Augmentin [amoxicillin-pot clavulanate] and Morphine and codeine   Assessment / Plan:     Visit Diagnoses: No diagnosis found.  ***  Orders: No orders of the defined types were placed in this encounter.  No orders of the defined types were placed in this encounter.    Follow-Up Instructions: No follow-ups on file.   Metta Clines, RT  Note - This record has been created using [employer].  Chart creation errors have been sought, but may not always  have been located. Such creation errors do not reflect on  the standard of medical care.

## 2023-09-21 ENCOUNTER — Ambulatory Visit (INDEPENDENT_AMBULATORY_CARE_PROVIDER_SITE_OTHER): Payer: Commercial Managed Care - PPO

## 2023-09-21 DIAGNOSIS — Z01810 Encounter for preprocedural cardiovascular examination: Secondary | ICD-10-CM

## 2023-09-21 LAB — CUP PACEART REMOTE DEVICE CHECK
Battery Impedance: 2232 Ohm
Battery Remaining Longevity: 29 mo
Battery Voltage: 2.74 V
Brady Statistic AP VP Percent: 0 %
Brady Statistic AP VS Percent: 34 %
Brady Statistic AS VP Percent: 0 %
Brady Statistic AS VS Percent: 66 %
Date Time Interrogation Session: 20241230132144
Implantable Lead Connection Status: 753985
Implantable Lead Connection Status: 753985
Implantable Lead Implant Date: 19960806
Implantable Lead Implant Date: 19960806
Implantable Lead Location: 753859
Implantable Lead Location: 753860
Implantable Lead Model: 4524
Implantable Lead Model: 5034
Implantable Pulse Generator Implant Date: 20110328
Lead Channel Impedance Value: 1176 Ohm
Lead Channel Impedance Value: 289 Ohm
Lead Channel Pacing Threshold Amplitude: 1.375 V
Lead Channel Pacing Threshold Amplitude: 1.625 V
Lead Channel Pacing Threshold Pulse Width: 0.4 ms
Lead Channel Pacing Threshold Pulse Width: 0.4 ms
Lead Channel Setting Pacing Amplitude: 2.75 V
Lead Channel Setting Pacing Amplitude: 3.25 V
Lead Channel Setting Pacing Pulse Width: 0.4 ms
Lead Channel Setting Sensing Sensitivity: 2 mV
Zone Setting Status: 755011
Zone Setting Status: 755011

## 2023-09-30 ENCOUNTER — Ambulatory Visit: Payer: Commercial Managed Care - PPO | Admitting: Internal Medicine

## 2023-09-30 DIAGNOSIS — M47819 Spondylosis without myelopathy or radiculopathy, site unspecified: Secondary | ICD-10-CM

## 2023-09-30 DIAGNOSIS — N183 Chronic kidney disease, stage 3 unspecified: Secondary | ICD-10-CM

## 2023-09-30 DIAGNOSIS — L4050 Arthropathic psoriasis, unspecified: Secondary | ICD-10-CM

## 2023-09-30 DIAGNOSIS — Z79899 Other long term (current) drug therapy: Secondary | ICD-10-CM

## 2023-10-22 ENCOUNTER — Other Ambulatory Visit: Payer: Self-pay

## 2023-10-23 ENCOUNTER — Other Ambulatory Visit: Payer: Self-pay

## 2023-10-26 ENCOUNTER — Other Ambulatory Visit: Payer: Self-pay | Admitting: Family Medicine

## 2023-10-26 NOTE — Telephone Encounter (Signed)
Appt please

## 2023-10-28 ENCOUNTER — Other Ambulatory Visit: Payer: Self-pay

## 2023-10-28 MED ORDER — FUROSEMIDE 20 MG PO TABS
20.0000 mg | ORAL_TABLET | Freq: Every day | ORAL | 0 refills | Status: DC | PRN
  Filled 2023-10-28: qty 30, 30d supply, fill #0

## 2023-10-28 NOTE — Telephone Encounter (Signed)
 Requested medication (s) are due for refill today: yes  Requested medication (s) are on the active medication list: yes  Last refill:  06/19/23 #90  Future visit scheduled: yes  Notes to clinic:  abnormal lab work   Requested Prescriptions  Pending Prescriptions Disp Refills   furosemide  (LASIX ) 20 MG tablet 90 tablet 0    Sig: Take 1 tablet (20 mg total) by mouth daily as needed. OFFICE VISIT NEEDED FOR ADDITIONAL REFILLS     Cardiovascular:  Diuretics - Loop Failed - 10/28/2023 10:57 AM      Failed - Cr in normal range and within 180 days    Creat  Date Value Ref Range Status  03/30/2023 0.95 0.50 - 1.03 mg/dL Final   Creatinine, Ser  Date Value Ref Range Status  07/07/2023 1.16 (H) 0.57 - 1.00 mg/dL Final         Failed - Mg Level in normal range and within 180 days    Magnesium  Date Value Ref Range Status  01/28/2023 2.2 1.6 - 2.3 mg/dL Final         Passed - K in normal range and within 180 days    Potassium  Date Value Ref Range Status  07/07/2023 4.2 3.5 - 5.2 mmol/L Final  06/15/2014 3.3 (L) 3.5 - 5.1 mmol/L Final         Passed - Ca in normal range and within 180 days    Calcium   Date Value Ref Range Status  07/07/2023 9.2 8.7 - 10.2 mg/dL Final   Calcium , Total  Date Value Ref Range Status  06/15/2014 9.2 8.5 - 10.1 mg/dL Final         Passed - Na in normal range and within 180 days    Sodium  Date Value Ref Range Status  07/07/2023 139 134 - 144 mmol/L Final  06/15/2014 135 (L) 136 - 145 mmol/L Final         Passed - Cl in normal range and within 180 days    Chloride  Date Value Ref Range Status  07/07/2023 101 96 - 106 mmol/L Final  06/15/2014 98 98 - 107 mmol/L Final         Passed - Last BP in normal range    BP Readings from Last 1 Encounters:  07/21/23 121/68         Passed - Valid encounter within last 6 months    Recent Outpatient Visits           4 months ago Routine general medical examination at a health care facility    [facility], Megan P, DO   9 months ago Dizziness   [place] [facility] [place], [place] P, DO   1 year ago Acute left-sided low back pain with left-sided sciatica   [facility] [place], Megan P, DO   1 year ago Acute left-sided low back pain with left-sided sciatica   [facility] [place], Megan P, DO   1 year ago Left hip pain   [place] [facility] Mecum, Rocky BRAVO, PA-C       Future Appointments             In 2 months Vicci, Duwaine SQUIBB, DO [place] [facility]

## 2023-10-28 NOTE — Telephone Encounter (Signed)
 Ok for [facility] to review.   Left message for patient. Once appointment is scheduled refill can be processed to PCP for 30 days.

## 2023-11-03 ENCOUNTER — Other Ambulatory Visit (HOSPITAL_COMMUNITY): Payer: Self-pay

## 2023-11-06 ENCOUNTER — Telehealth (INDEPENDENT_AMBULATORY_CARE_PROVIDER_SITE_OTHER): Payer: Self-pay | Admitting: Family Medicine

## 2023-11-06 ENCOUNTER — Encounter: Payer: Self-pay | Admitting: Family Medicine

## 2023-11-06 DIAGNOSIS — G43009 Migraine without aura, not intractable, without status migrainosus: Secondary | ICD-10-CM

## 2023-11-06 DIAGNOSIS — F331 Major depressive disorder, recurrent, moderate: Secondary | ICD-10-CM

## 2023-11-06 MED ORDER — SERTRALINE HCL 100 MG PO TABS: 100.0000 mg | ORAL_TABLET | Freq: Every day | ORAL | 0 refills | Status: DC

## 2023-11-06 MED ORDER — FUROSEMIDE 20 MG PO TABS: 20.0000 mg | ORAL_TABLET | Freq: Every day | ORAL | 0 refills | Status: DC | PRN

## 2023-11-06 MED ORDER — BUPROPION HCL ER (XL) 150 MG PO TB24: 150.0000 mg | ORAL_TABLET | Freq: Every day | ORAL | 0 refills | Status: DC

## 2023-11-06 MED ORDER — RIZATRIPTAN BENZOATE 10 MG PO TABS: 10.0000 mg | ORAL_TABLET | ORAL | 2 refills | Status: DC | PRN

## 2023-11-06 MED ORDER — BUPROPION HCL ER (XL) 300 MG PO TB24: 300.0000 mg | ORAL_TABLET | Freq: Every day | ORAL | 0 refills | Status: DC

## 2023-11-06 MED ORDER — GABAPENTIN 400 MG PO CAPS: 400.0000 mg | ORAL_CAPSULE | Freq: Every day | ORAL | 0 refills | Status: DC

## 2023-11-06 MED ORDER — ATORVASTATIN CALCIUM 40 MG PO TABS: 40.0000 mg | ORAL_TABLET | Freq: Every day | ORAL | 0 refills | Status: DC

## 2023-11-06 NOTE — Assessment & Plan Note (Signed)
Doing well on the sertraline, but still irritable. We will increase her sertraline to 100mg  and recheck in 1 month. Call with any concerns. Continue to monitor.

## 2023-11-06 NOTE — Progress Notes (Signed)
There were no vitals taken for this visit.   Subjective:    Patient ID: Emma Rogers, female    DOB: Oct 18, 1965, 58 y.o.   MRN: [id number]  HPI: Emma Rogers is a 58 y.o. female  Chief Complaint  Patient presents with   Depression   DEPRESSION Duration: chronic Mood status: better Satisfied with current treatment?: no Symptom severity: mild  Duration of current treatment : months Side effects: no Medication compliance: excellent compliance Psychotherapy/counseling: no  Previous psychiatric medications: wellbutrin and sertraline Depressed mood: yes Anxious mood: yes Anhedonia: no Significant weight loss or gain: no Insomnia: no  Fatigue: yes Feelings of worthlessness or guilt: yes Impaired concentration/indecisiveness: yes Suicidal ideations: no Hopelessness: no Crying spells: no    11/06/2023    9:54 AM 06/30/2023    3:24 PM 01/28/2023    4:16 PM 09/09/2022    3:08 PM 08/12/2022    3:04 PM  Depression screen PHQ 2/9  Decreased Interest 0 3 0 0 0  Down, Depressed, Hopeless 0 2 0 1 0  PHQ - 2 Score 0 5 0 1 0  Altered sleeping 2 3 2 2 2   Tired, decreased energy 3 2 3 2 2   Change in appetite 0 2 0 0 0  Feeling bad or failure about yourself  0 0 0 0 0  Trouble concentrating 0 2 0 1 1  Moving slowly or fidgety/restless 0 0 0 0 0  Suicidal thoughts 0 0 0 0 0  PHQ-9 Score 5 14 5 6 5   Difficult doing work/chores Somewhat difficult Somewhat difficult Not difficult at all Somewhat difficult Not difficult at all      11/06/2023    9:56 AM 06/30/2023    3:24 PM 01/28/2023    4:17 PM 09/09/2022    3:08 PM  GAD 7 : Generalized Anxiety Score  Nervous, Anxious, on Edge 0 3 1 0  Control/stop worrying 2 1 0 0  Worry too much - different things 2 1 0 0  Trouble relaxing 0 0 0 0  Restless 0 0 0 0  Easily annoyed or irritable 2 1 0 0  Afraid - awful might happen  0 0 0  Total GAD 7 Score  6 1 0  Anxiety Difficulty Not difficult at all Somewhat difficult Not difficult at  all Not difficult at all   Back has still been acting up after the surgeries. Working on disability.   Relevant past medical, surgical, family and social history reviewed and updated as indicated. Interim medical history since our last visit reviewed. Allergies and medications reviewed and updated.  Review of Systems  Constitutional: Negative.   Respiratory: Negative.    Cardiovascular: Negative.   Musculoskeletal:  Positive for back pain and myalgias. Negative for arthralgias, gait problem, joint swelling, neck pain and neck stiffness.  Skin: Negative.   Neurological: Negative.   Psychiatric/Behavioral: Negative.      Per HPI unless specifically indicated above     Objective:    There were no vitals taken for this visit.  Wt Readings from Last 3 Encounters:  07/08/23 211 lb 10.3 oz (96 kg)  07/01/23 212 lb 4.8 oz (96.3 kg)  06/30/23 210 lb 3.2 oz (95.3 kg)    Physical Exam Vitals and nursing note reviewed.  Constitutional:      General: She is not in acute distress.    Appearance: Normal appearance. She is not ill-appearing, toxic-appearing or diaphoretic.  HENT:     Head: Normocephalic and atraumatic.  Right Ear: External ear normal.     Left Ear: External ear normal.     Nose: Nose normal.     Mouth/Throat:     Mouth: Mucous membranes are moist.     Pharynx: Oropharynx is clear.  Eyes:     General: No scleral icterus.       Right eye: No discharge.        Left eye: No discharge.     Conjunctiva/sclera: Conjunctivae normal.     Pupils: Pupils are equal, round, and reactive to light.  Pulmonary:     Effort: Pulmonary effort is normal. No respiratory distress.     Comments: Speaking in full sentences Musculoskeletal:        General: Normal range of motion.     Cervical back: Normal range of motion.  Skin:    Coloration: Skin is not jaundiced or pale.     Findings: No bruising, erythema, lesion or rash.  Neurological:     Mental Status: She is alert and  oriented to person, place, and time. Mental status is at baseline.  Psychiatric:        Mood and Affect: Mood normal.        Behavior: Behavior normal.        Thought Content: Thought content normal.        Judgment: Judgment normal.     Results for orders placed or performed in visit on 09/21/23  CUP PACEART REMOTE DEVICE CHECK   Collection Time: 09/21/23  1:21 PM  Result Value Ref Range   Date Time Interrogation Session [id number]    Pulse Generator Manufacturer MERM    Pulse Gen Model ADDRL1 Adapta    Pulse Gen Serial Number [id number]    Clinic Name [facility]    Implantable Pulse Generator Type Implantable Pulse Generator    Implantable Pulse Generator Implant Date [id number]    Implantable Lead Manufacturer MERM    Implantable Lead Model 4524 CapSure SP    Implantable Lead Serial Number [id number]    Implantable Lead Implant Date [id number]    Implantable Lead Location Detail 1 APPENDAGE    Implantable Lead Location [id number]    Implantable Lead Connection Status [id number]    Implantable Lead Manufacturer MERM    Implantable Lead Model [id number] CapSure Z    Implantable Lead Serial Number [id number]    Implantable Lead Implant Date [id number]    Implantable Lead Location Detail 1 APEX    Implantable Lead Location [id number]    Implantable Lead Connection Status L088196    Lead Channel Setting Sensing Sensitivity 2.00 mV   Lead Channel Setting Pacing Amplitude 2.750 V   Lead Channel Setting Pacing Pulse Width 0.40 ms   Lead Channel Setting Pacing Amplitude 3.250 V   Zone Setting Status 755011    Zone Setting Status 755011    Lead Channel Impedance Value 289 ohm   Lead Channel Pacing Threshold Amplitude 1.375 V   Lead Channel Pacing Threshold Pulse Width 0.40 ms   Lead Channel Impedance Value 1,176 ohm   Lead Channel Pacing Threshold Amplitude 1.625 V   Lead Channel Pacing Threshold Pulse Width 0.40 ms   Battery Status OK    Battery Remaining Longevity 29 mo   Battery Voltage  2.74 V   Battery Impedance 2,232 ohm   Brady Statistic AP VP Percent 0 %   Brady Statistic AS VP Percent 0 %   Brady Statistic AP VS Percent 34 %   Brady Statistic AS VS Percent  66 %      Assessment & Plan:   Problem List Items Addressed This Visit       Cardiovascular and Mediastinum   Migraine without aura and without status migrainosus, not intractable   Relevant Medications   sertraline (ZOLOFT) 100 MG tablet   rizatriptan (MAXALT) 10 MG tablet   buPROPion (WELLBUTRIN XL) 300 MG 24 hr tablet   buPROPion (WELLBUTRIN XL) 150 MG 24 hr tablet   atorvastatin (LIPITOR) 40 MG tablet   gabapentin (NEURONTIN) 400 MG capsule   furosemide (LASIX) 20 MG tablet     Other   MDD (major depressive disorder), recurrent episode, moderate (HCC) - Primary   Doing well on the sertraline, but still irritable. We will increase her sertraline to 100mg  and recheck in 1 month. Call with any concerns. Continue to monitor.       Relevant Medications   sertraline (ZOLOFT) 100 MG tablet   buPROPion (WELLBUTRIN XL) 300 MG 24 hr tablet   buPROPion (WELLBUTRIN XL) 150 MG 24 hr tablet     Follow up plan: Return in about 4 weeks (around 12/04/2023) for in person.    This visit was completed via video visit through MyChart due to the restrictions of the COVID-19 pandemic. All issues as above were discussed and addressed. Physical exam was done as above through visual confirmation on video through MyChart. If it was felt that the patient should be evaluated in the office, they were directed there. The patient verbally consented to this visit. Location of the patient: home Location of the provider: work Those involved with this call:  Provider: Olevia Perches, DO CMA: Irene Pap, CMA Front Desk/Registration: Servando Snare  Time spent on call:  15 minutes with patient face to face via video conference. More than 50% of this time was spent in counseling and coordination of care. 23 minutes total spent in  review of patient's record and preparation of their chart.

## 2023-11-09 ENCOUNTER — Other Ambulatory Visit: Payer: Self-pay

## 2023-11-28 ENCOUNTER — Emergency Department
Admission: EM | Admit: 2023-11-28 | Discharge: 2023-11-29 | Disposition: A | Discharge status: Home / Self Care | Payer: MEDICAID | Attending: Emergency Medicine | Admitting: Emergency Medicine

## 2023-11-28 ENCOUNTER — Other Ambulatory Visit: Payer: Self-pay

## 2023-11-28 ENCOUNTER — Emergency Department: Payer: Self-pay

## 2023-11-28 DIAGNOSIS — R042 Hemoptysis: Secondary | ICD-10-CM | POA: Diagnosis not present

## 2023-11-28 DIAGNOSIS — R918 Other nonspecific abnormal finding of lung field: Secondary | ICD-10-CM | POA: Diagnosis not present

## 2023-11-28 DIAGNOSIS — E785 Hyperlipidemia, unspecified: Secondary | ICD-10-CM | POA: Insufficient documentation

## 2023-11-28 DIAGNOSIS — I10 Essential (primary) hypertension: Secondary | ICD-10-CM | POA: Diagnosis not present

## 2023-11-28 DIAGNOSIS — J984 Other disorders of lung: Secondary | ICD-10-CM | POA: Diagnosis not present

## 2023-11-28 DIAGNOSIS — J189 Pneumonia, unspecified organism: Secondary | ICD-10-CM | POA: Diagnosis not present

## 2023-11-28 DIAGNOSIS — J45909 Unspecified asthma, uncomplicated: Secondary | ICD-10-CM | POA: Diagnosis not present

## 2023-11-28 DIAGNOSIS — I509 Heart failure, unspecified: Secondary | ICD-10-CM

## 2023-11-28 DIAGNOSIS — L4050 Arthropathic psoriasis, unspecified: Secondary | ICD-10-CM | POA: Diagnosis not present

## 2023-11-28 DIAGNOSIS — J90 Pleural effusion, not elsewhere classified: Secondary | ICD-10-CM | POA: Diagnosis not present

## 2023-11-28 DIAGNOSIS — R059 Cough, unspecified: Secondary | ICD-10-CM | POA: Diagnosis not present

## 2023-11-28 DIAGNOSIS — Z950 Presence of cardiac pacemaker: Secondary | ICD-10-CM | POA: Diagnosis not present

## 2023-11-28 DIAGNOSIS — I517 Cardiomegaly: Secondary | ICD-10-CM | POA: Diagnosis not present

## 2023-11-28 DIAGNOSIS — E049 Nontoxic goiter, unspecified: Secondary | ICD-10-CM | POA: Diagnosis not present

## 2023-11-28 DIAGNOSIS — R058 Other specified cough: Secondary | ICD-10-CM | POA: Diagnosis not present

## 2023-11-28 LAB — CBC
HCT: 39.9 % (ref 36.0–46.0)
Hemoglobin: 13.3 g/dL (ref 12.0–15.0)
MCH: 30.6 pg (ref 26.0–34.0)
MCHC: 33.3 g/dL (ref 30.0–36.0)
MCV: 91.9 fL (ref 80.0–100.0)
Platelets: 263 10*3/uL (ref 150–400)
RBC: 4.34 MIL/uL (ref 3.87–5.11)
RDW: 14.5 % (ref 11.5–15.5)
WBC: 6.7 10*3/uL (ref 4.0–10.5)
nRBC: 0 % (ref 0.0–0.2)

## 2023-11-28 LAB — COMPREHENSIVE METABOLIC PANEL
ALT: 18 U/L (ref 0–44)
AST: 19 U/L (ref 15–41)
Albumin: 4 g/dL (ref 3.5–5.0)
Alkaline Phosphatase: 60 U/L (ref 38–126)
Anion gap: 12 (ref 5–15)
BUN: 25 mg/dL — ABNORMAL HIGH (ref 6–20)
CO2: 25 mmol/L (ref 22–32)
Calcium: 9 mg/dL (ref 8.9–10.3)
Chloride: 101 mmol/L (ref 98–111)
Creatinine, Ser: 1.2 mg/dL — ABNORMAL HIGH (ref 0.44–1.00)
GFR, Estimated: 53 mL/min — ABNORMAL LOW (ref >60)
Glucose, Bld: 142 mg/dL — ABNORMAL HIGH (ref 70–99)
Potassium: 4.1 mmol/L (ref 3.5–5.1)
Sodium: 138 mmol/L (ref 135–145)
Total Bilirubin: 0.7 mg/dL (ref 0.0–1.2)
Total Protein: 7.3 g/dL (ref 6.5–8.1)

## 2023-11-28 LAB — TROPONIN I (HIGH SENSITIVITY)
Troponin I (High Sensitivity): 3 ng/L (ref <18)
Troponin I (High Sensitivity): 3 ng/L (ref <18)

## 2023-11-28 LAB — BRAIN NATRIURETIC PEPTIDE: B Natriuretic Peptide: 19.5 pg/mL (ref 0.0–100.0)

## 2023-11-28 LAB — RESP PANEL BY RT-PCR (RSV, FLU A&B, COVID)  RVPGX2
Influenza A by PCR: NEGATIVE
Influenza B by PCR: NEGATIVE
Resp Syncytial Virus by PCR: NEGATIVE
SARS Coronavirus 2 by RT PCR: NEGATIVE

## 2023-11-28 LAB — LACTIC ACID, PLASMA: Lactic Acid, Venous: 0.6 mmol/L (ref 0.5–1.9)

## 2023-11-28 MED ORDER — DOXYCYCLINE HYCLATE 100 MG PO TABS
100.0000 mg | ORAL_TABLET | Freq: Once | ORAL | Status: AC
  Administered 2023-11-28: 100 mg via ORAL
  Filled 2023-11-28: qty 1

## 2023-11-28 MED ORDER — DOXYCYCLINE HYCLATE 100 MG PO TABS
100.0000 mg | ORAL_TABLET | Freq: Two times a day (BID) | ORAL | 0 refills | Status: DC
Start: 2023-11-28 — End: 2023-11-28
  Filled 2023-11-28: qty 14, 7d supply, fill #0

## 2023-11-28 MED ORDER — BENZONATATE 100 MG PO CAPS
100.0000 mg | ORAL_CAPSULE | Freq: Three times a day (TID) | ORAL | 0 refills | Status: DC | PRN
Start: 2023-11-28 — End: 2023-12-10

## 2023-11-28 MED ORDER — SODIUM CHLORIDE 0.9 % IV SOLN: 500.0000 mg | INTRAVENOUS | Status: DC

## 2023-11-28 MED ORDER — PREDNISONE 20 MG PO TABS: 40.0000 mg | ORAL_TABLET | Freq: Every day | ORAL | 0 refills | Status: AC

## 2023-11-28 MED ORDER — IPRATROPIUM-ALBUTEROL 0.5-2.5 (3) MG/3ML IN SOLN: 3.0000 mL | Freq: Four times a day (QID) | RESPIRATORY_TRACT | Status: DC

## 2023-11-28 MED ORDER — SODIUM CHLORIDE 0.9 % IV SOLN: 250.0000 mL | INTRAVENOUS | Status: DC | PRN

## 2023-11-28 MED ORDER — CEFDINIR 300 MG PO CAPS
300.0000 mg | ORAL_CAPSULE | Freq: Two times a day (BID) | ORAL | 0 refills | Status: DC
  Filled 2023-11-28: qty 14, 7d supply, fill #0

## 2023-11-28 MED ORDER — SODIUM CHLORIDE 0.9% FLUSH: 3.0000 mL | Freq: Two times a day (BID) | INTRAVENOUS | Status: DC

## 2023-11-28 MED ORDER — SODIUM CHLORIDE 0.9% FLUSH: 3.0000 mL | INTRAVENOUS | Status: DC | PRN

## 2023-11-28 MED ORDER — SODIUM CHLORIDE 0.9 % IV SOLN: 2.0000 g | INTRAVENOUS | Status: DC

## 2023-11-28 MED ORDER — ACETAMINOPHEN 325 MG PO TABS: 650.0000 mg | ORAL_TABLET | ORAL | Status: DC | PRN

## 2023-11-28 MED ORDER — DOXYCYCLINE HYCLATE 100 MG PO TABS: 100.0000 mg | ORAL_TABLET | Freq: Two times a day (BID) | ORAL | 0 refills | Status: AC

## 2023-11-28 MED ORDER — IOHEXOL 350 MG/ML SOLN
100.0000 mL | Freq: Once | INTRAVENOUS | Status: AC | PRN
  Administered 2023-11-28: 75 mL via INTRAVENOUS

## 2023-11-28 MED ORDER — ENOXAPARIN SODIUM 40 MG/0.4ML IJ SOSY: 40.0000 mg | PREFILLED_SYRINGE | INTRAMUSCULAR | Status: DC

## 2023-11-28 MED ORDER — CEFDINIR 300 MG PO CAPS: 300.0000 mg | ORAL_CAPSULE | Freq: Two times a day (BID) | ORAL | 0 refills | Status: AC

## 2023-11-28 MED ORDER — IPRATROPIUM-ALBUTEROL 0.5-2.5 (3) MG/3ML IN SOLN: 3.0000 mL | RESPIRATORY_TRACT | Status: DC | PRN

## 2023-11-28 MED ORDER — FUROSEMIDE 10 MG/ML IJ SOLN
40.0000 mg | Freq: Two times a day (BID) | INTRAMUSCULAR | Status: DC
  Administered 2023-11-29: 40 mg via INTRAVENOUS
  Filled 2023-11-28: qty 4

## 2023-11-28 MED ORDER — ALPRAZOLAM 0.25 MG PO TABS
0.2500 mg | ORAL_TABLET | Freq: Two times a day (BID) | ORAL | Status: DC | PRN
Start: 2023-11-28 — End: 2023-11-29

## 2023-11-28 MED ORDER — METHYLPREDNISOLONE SODIUM SUCC 125 MG IJ SOLR
125.0000 mg | Freq: Once | INTRAMUSCULAR | Status: AC
  Administered 2023-11-28: 125 mg via INTRAVENOUS
  Filled 2023-11-28: qty 2

## 2023-11-28 MED ORDER — GUAIFENESIN ER 600 MG PO TB12: 600.0000 mg | ORAL_TABLET | Freq: Two times a day (BID) | ORAL | Status: DC

## 2023-11-28 MED ORDER — SODIUM CHLORIDE 0.9 % IV SOLN
2.0000 g | Freq: Once | INTRAVENOUS | Status: DC
  Administered 2023-11-28: 2 g via INTRAVENOUS
  Filled 2023-11-28: qty 20

## 2023-11-28 MED ORDER — ONDANSETRON HCL 4 MG/2ML IJ SOLN: 4.0000 mg | Freq: Four times a day (QID) | INTRAMUSCULAR | Status: DC | PRN

## 2023-11-28 MED ORDER — HYDROCOD POLI-CHLORPHE POLI ER 10-8 MG/5ML PO SUER: 5.0000 mL | Freq: Two times a day (BID) | ORAL | Status: DC | PRN

## 2023-11-28 MED ORDER — PREDNISONE 20 MG PO TABS
40.0000 mg | ORAL_TABLET | Freq: Every day | ORAL | 0 refills | Status: DC
  Filled 2023-11-28: qty 10, 5d supply, fill #0

## 2023-11-28 MED ORDER — BENZONATATE 100 MG PO CAPS
100.0000 mg | ORAL_CAPSULE | Freq: Three times a day (TID) | ORAL | 0 refills | Status: DC | PRN
  Filled 2023-11-28: qty 30, 10d supply, fill #0

## 2023-11-28 NOTE — ED Triage Notes (Signed)
 Pt c/o productive cough x3 days and reports coughing up blood today. Pt AOX4, NAD noted, respirations even and unlabored. Cough noted. Pt reports asthma, pt denies emesis or diarrhea, is nauseated.

## 2023-11-28 NOTE — Discharge Instructions (Addendum)
 We are starting you on antibiotics and steroids due to concerns for possible infection in your lungs. We had recommended admission to monitor your lungs to ensure the bleeding did not get worse and causing more emergent issue but at this time given the bleeding is minimal and your workup was otherwise reassuring you have opted to want to go home. If you start coughing up more blood that you should return to the ER immediately for repeat evaluation You can also take an extra dose of your Lasix for 1 day in case there is a little bit of fluid on there I will have you follow-up with the heart failure clinic  IMPRESSION: 1. No evidence of pulmonary embolism. 2. Mild, ill-defined patchy areas of ground-glass appearing lung parenchyma bilaterally, which may represent mild pulmonary edema. An inflammatory process such as pulmonary pneumonitis cannot be excluded. 3. Mild lingular, right middle lobe and left lower lobe linear scarring and/or atelectasis. 4. Small hiatal hernia.

## 2023-11-28 NOTE — ED Notes (Signed)
 Patient transported to CT

## 2023-11-28 NOTE — ED Provider Notes (Addendum)
 [facility] Provider Note    Event Date/Time   First MD Initiated Contact with Patient 11/28/23 2049     (approximate)   History   Hemoptysis   HPI  Emma Rogers is a 58 y.o. female with psoriatic arthritis, asthma, hyperlipidemia, CHF who comes in for hemoptysis.  Patient reports having a spinal surgery back in October 2024.  She reports having not been feeling well for the past couple of days with cough, shortness of breath.  She does report coughing up blood no.  She states that her husband was sick a week or 2 ago and was negative for COVID, flu but was treated for pneumonia.  She denies any falls or hitting her head or any abdominal pain.   I reviewed the note from cardiology where patient has a [employer] pacemaker  Physical Exam   Triage Vital Signs: ED Triage Vitals [11/28/23 2023]  Encounter Vitals Group     BP (!) 122/90     Systolic BP Percentile      Diastolic BP Percentile      Pulse Rate 88     Resp (!) 21     Temp 100 F (37.8 C)     Temp Source Oral     SpO2 94 %     Weight      Height      Head Circumference      Peak Flow      Pain Score 4     Pain Loc      Pain Education      Exclude from Growth Chart     Most recent vital signs: Vitals:   11/28/23 2023  BP: (!) 122/90  Pulse: 88  Resp: (!) 21  Temp: 100 F (37.8 C)  SpO2: 94%     General: Awake, no distress.  CV:  Good peripheral perfusion.  Resp:  Normal effort. No wheeze noted  Abd:  No distention. Soft and non tender Other:  No swelling legs.  No calf tenderness   ED Results / Procedures / Treatments   Labs (all labs ordered are listed, but only abnormal results are displayed) Labs Reviewed  RESP PANEL BY RT-PCR (RSV, FLU A&B, COVID)  [id number]  COMPREHENSIVE METABOLIC PANEL  CBC  LACTIC ACID, PLASMA  LACTIC ACID, PLASMA  BRAIN NATRIURETIC PEPTIDE  TROPONIN I (HIGH SENSITIVITY)     EKG  My interpretation of EKG:  Normal sinus rate of 70  without any ST elevation or T wave inversions, normal intervals  RADIOLOGY I have reviewed the xray personally and interpreted patient has small right pleural effusion mild cardiomegaly with left-sided pacemaker   PROCEDURES:  Critical Care performed: No  .1-3 Lead EKG Interpretation  Performed by: Concha Se, MD Authorized by: Concha Se, MD     Interpretation: normal     ECG rate:  70   ECG rate assessment: normal     Rhythm: sinus rhythm     Ectopy: none     Conduction: normal      MEDICATIONS ORDERED IN ED: Medications  methylPREDNISolone sodium succinate (SOLU-MEDROL) 125 mg/2 mL injection 125 mg (has no administration in time range)  cefTRIAXone (ROCEPHIN) 2 g in sodium chloride 0.9 % 100 mL IVPB (has no administration in time range)  doxycycline (VIBRA-TABS) tablet 100 mg (has no administration in time range)  iohexol (OMNIPAQUE) 350 MG/ML injection 100 mL (75 mLs Intravenous Contrast Given 11/28/23 2239)     IMPRESSION / MDM /  ASSESSMENT AND PLAN / ED COURSE  I reviewed the triage vital signs and the nursing notes.   Patient's presentation is most consistent with acute presentation with potential threat to life or bodily function.   Patient comes in with symptoms concerning for pneumonia, PE, ACS, COVID, flu.  Her x-ray without evidence of actual pneumonia but does have a small pleural effusion and some cardiomegaly.  I did recommend doing a CT scan to evaluate deeper to make sure there is nothing like a pulmonary embolism given the coughing up blood patient looks very comfortable denies any medication I do not hear any current wheezing to suggest asthma exacerbation.  Patient had delay in blood work secondary to the labs getting stuck in the tube.   Lactate normal COVID, flu are negative CMP shows creatinine of 1.2 CBC reassuring  MPRESSION: 1. No evidence of pulmonary embolism. 2. Mild, ill-defined patchy areas of ground-glass appearing lung parenchyma  bilaterally, which may represent mild pulmonary edema. An inflammatory process such as pulmonary pneumonitis cannot be excluded. 3. Mild lingular, right middle lobe and left lower lobe linear scarring and/or atelectasis. 4. Small hiatal hernia.  Patient denies any exposures to suggest pulmonary pneumonitis.  She is still continue to be coughing up some blood-tinged sputum herefore I recommended admission for monitoring -patient is on Lasix so we will give her a dose of Lasix cover with antibiotics for possible pneumonia.  Patient does not meet sepsis criteria therefore blood cultures were not ordered.  I will discuss with hospital team for admission   11:47 PM Patient is now requesting to be discharged home after I did discuss with the hospital team for admission.  She understands her risk developing worsening bleeding that could lead to airway compromise, death.  Given its only been a small amount of blood this seems less likely.  Will discharge patient on prednisone, antibiotics.  Husband is witnessing this conversation and will bring her back if symptoms are worsening.   The patient is on the cardiac monitor to evaluate for evidence of arrhythmia and/or significant heart rate changes.      FINAL CLINICAL IMPRESSION(S) / ED DIAGNOSES   Final diagnoses:  Pneumonitis  Hemoptysis     Rx / DC Orders   ED Discharge Orders     None        Note:  This document was prepared using Dragon voice recognition software and may include unintentional dictation errors.   Concha Se, MD 11/28/23 2328    Concha Se, MD 11/28/23 [phone]

## 2023-11-29 ENCOUNTER — Other Ambulatory Visit: Payer: Self-pay

## 2023-11-29 ENCOUNTER — Ambulatory Visit: Payer: Self-pay

## 2023-11-30 ENCOUNTER — Telehealth: Payer: Self-pay | Admitting: Family

## 2023-11-30 ENCOUNTER — Telehealth: Payer: Self-pay

## 2023-11-30 NOTE — Transitions of Care (Post Inpatient/ED Visit) (Signed)
   11/30/2023  Name: Emma Rogers MRN: [id number] DOB: 1966/04/06  Today's TOC FU Call Status: Today's TOC FU Call Status:: Unsuccessful Call (1st Attempt) Unsuccessful Call (1st Attempt) Date: 11/30/23  Attempted to reach the patient regarding the most recent Inpatient/ED visit. Patient was called in an Outreach attempt to offer [facility]  30-day TOC program. . Unfortunately, I was not able to speak with the patient in regards to recent hospital discharge.     Patient's voicemail has  a generic greeting. To maintain HIPAA compliance, left message with [facility]BCI CM contact information only  and a request for a call back .   Follow Up Plan: No further Outreaches will be made at this time On review patient was no admitted and remained in the ER tor approximately 3 hours and does not meet criteria for [facility] Program   Susa Loffler , BSN, RN [facility]   VBCI-Population Health RN Care Manager Direct Dial [phone]  Fax: [phone] Website: [email or web address]

## 2023-11-30 NOTE — Telephone Encounter (Signed)
 Lvm to schedule referral

## 2023-12-03 ENCOUNTER — Ambulatory Visit: Payer: Self-pay | Admitting: Family Medicine

## 2023-12-09 ENCOUNTER — Telehealth: Payer: Self-pay | Admitting: Cardiology

## 2023-12-09 NOTE — Telephone Encounter (Signed)
 Pt confirmed appt 12/10/23

## 2023-12-10 ENCOUNTER — Ambulatory Visit: Payer: Self-pay | Attending: Cardiology | Admitting: Cardiology

## 2023-12-10 ENCOUNTER — Encounter: Payer: Self-pay | Admitting: Cardiology

## 2023-12-10 ENCOUNTER — Ambulatory Visit: Payer: Self-pay | Admitting: Family Medicine

## 2023-12-10 VITALS — BP 118/73 | HR 72 | Wt 221.4 lb

## 2023-12-10 DIAGNOSIS — R0609 Other forms of dyspnea: Secondary | ICD-10-CM | POA: Diagnosis not present

## 2023-12-10 NOTE — Patient Instructions (Signed)
   Testing/Procedures:  Your physician has requested that you have an echocardiogram. Echocardiography is a painless test that uses sound waves to create images of your heart. It provides your doctor with information about the size and shape of your heart and how well your heart's chambers and valves are working. This procedure takes approximately one hour. There are no restrictions for this procedure. Please do NOT wear cologne, perfume, aftershave, or lotions (deodorant is allowed). Please arrive 15 minutes prior to your appointment time.  Please note: We ask at that you not bring children with you during ultrasound (echo/ vascular) testing. Due to room size and safety concerns, children are not allowed in the ultrasound rooms during exams. Our front office staff cannot provide observation of children in our lobby area while testing is being conducted. An adult accompanying a patient to their appointment will only be allowed in the ultrasound room at the discretion of the ultrasound technician under special circumstances. We apologize for any inconvenience.  Please arrive at the [facility] on Tuesday June 10th at 9:45 AM for a 10 AM test.    Special Instructions // Education:  Do the following things EVERYDAY: Weigh yourself in the morning before breakfast. Write it down and keep it in a log. Take your medicines as prescribed Eat low salt foods--Limit salt (sodium) to 2000 mg per day.  Stay as active as you can everyday Limit all fluids for the day to less than 2 liters   Follow-Up in: 3 months with Dr. Elwyn Lade, after echo    If you have any questions or concerns before your next appointment please send [place] a message through [facility] or call our office at [phone], If it is after office hours your call will be answered by our answering service and directed appropriately.     At the [facility], you and your health needs are our priority. We have a designated  team specialized in the treatment of Heart Failure. This Care Team includes your primary Heart Failure Specialized Cardiologist (physician), Advanced Practice Providers (APPs- Physician Assistants and Nurse Practitioners), and Pharmacist who all work together to provide you with the care you need, when you need it.   You may see any of the following providers on your designated Care Team at your next follow up:  Dr. Arvilla Meres Dr. Marca Ancona Dr. Dorthula Nettles Dr. Theresia Bough Tonye Becket, NP Robbie Lis, [place] [address] [place], [place] Brynda Peon, NP [place] Lee, NP Clarisa Kindred, NP Enos Fling, PharmD

## 2023-12-14 ENCOUNTER — Institutional Professional Consult (permissible substitution): Payer: Self-pay | Admitting: Pulmonary Disease

## 2023-12-15 NOTE — Progress Notes (Signed)
   ADVANCED HEART FAILURE NEW PATIENT CLINIC NOTE  Referring Physician: Concha Se, MD  Primary Care: Dorcas Carrow, DO Primary Cardiologist:  HPI: Emma Rogers is a 58 y.o. female with a PMH of HTN, HLD, recent spinal surgery, CKD stage III who presents for initial visit for further evaluation and treatment of heart failure/cardiomyopathy.        SUBJECTIVE: Patient overall doing well, BP controlled on current medication. She has a dual chamber pacemaker in from 1996, unable to determine the etiology from chart review. No history of heart failure, last echo normal in 2019. Seen in the ED for hemoptysis, but likely related to recent viral infection, has improved.    PMH, current medications, allergies, social history, and family history reviewed in epic.  PHYSICAL EXAM: Vitals:   12/10/23 1051  BP: 118/73  Pulse: 72  SpO2: 98%   GENERAL: Well nourished and in no apparent distress at rest.  PULM:  Normal work of breathing, clear to auscultation bilaterally. Respirations are unlabored.  CARDIAC:  JVP: flat         Normal rate with regular rhythm. No murmurs, rubs or gallops.  No edema. Warm and well perfused extremities. ABDOMEN: Soft, non-tender, non-distended. NEUROLOGIC: Patient is oriented x3 with no focal or lateralizing neurologic deficits.    DATA REVIEW    ECHO: 2019: LVEF 60-65%, normal diastology  CATH: None    ASSESSMENT & PLAN:  Shortness of breath and pleural effusion: Referred from the ED, workup reassuring with normal BNP. Given cardiac history including pacemaker will obtain echocardiogram to evaluate EF. If reduced will need further workup, otherwise can likely continue follow up with EP. - Echo at next visit.   - Taking lasix prn - No rationale to start GDMT, reassuring exam  Follow up in 3 months with echo than prn  Clearnce Hasten, MD Advanced Heart Failure Mechanical Circulatory Support 12/22/23

## 2023-12-21 ENCOUNTER — Ambulatory Visit: Payer: Self-pay | Admitting: Family Medicine

## 2023-12-21 ENCOUNTER — Ambulatory Visit (INDEPENDENT_AMBULATORY_CARE_PROVIDER_SITE_OTHER): Payer: Commercial Managed Care - PPO

## 2023-12-21 DIAGNOSIS — I509 Heart failure, unspecified: Secondary | ICD-10-CM | POA: Diagnosis not present

## 2023-12-22 ENCOUNTER — Ambulatory Visit: Payer: Self-pay | Admitting: Family Medicine

## 2023-12-22 LAB — CUP PACEART REMOTE DEVICE CHECK
Battery Impedance: 2294 Ohm
Battery Remaining Longevity: 27 mo
Battery Voltage: 2.74 V
Brady Statistic AP VP Percent: 0 %
Brady Statistic AP VS Percent: 35 %
Brady Statistic AS VP Percent: 0 %
Brady Statistic AS VS Percent: 64 %
Date Time Interrogation Session: 20250401101041
Implantable Lead Connection Status: 753985
Implantable Lead Connection Status: 753985
Implantable Lead Implant Date: 19960806
Implantable Lead Implant Date: 19960806
Implantable Lead Location: 753859
Implantable Lead Location: 753860
Implantable Lead Model: 4524
Implantable Lead Model: 5034
Implantable Pulse Generator Implant Date: 20110328
Lead Channel Impedance Value: 1140 Ohm
Lead Channel Impedance Value: 289 Ohm
Lead Channel Pacing Threshold Amplitude: 1.5 V
Lead Channel Pacing Threshold Amplitude: 1.625 V
Lead Channel Pacing Threshold Pulse Width: 0.4 ms
Lead Channel Pacing Threshold Pulse Width: 0.4 ms
Lead Channel Setting Pacing Amplitude: 3 V
Lead Channel Setting Pacing Amplitude: 3.25 V
Lead Channel Setting Pacing Pulse Width: 0.4 ms
Lead Channel Setting Sensing Sensitivity: 2 mV
Zone Setting Status: 755011
Zone Setting Status: 755011

## 2023-12-24 DIAGNOSIS — M48062 Spinal stenosis, lumbar region with neurogenic claudication: Secondary | ICD-10-CM | POA: Diagnosis not present

## 2023-12-24 DIAGNOSIS — M25552 Pain in left hip: Secondary | ICD-10-CM | POA: Diagnosis not present

## 2023-12-25 ENCOUNTER — Ambulatory Visit: Payer: Self-pay | Admitting: Family Medicine

## 2023-12-29 ENCOUNTER — Encounter: Payer: Self-pay | Admitting: Family Medicine

## 2023-12-29 ENCOUNTER — Ambulatory Visit: Payer: Self-pay | Admitting: Family Medicine

## 2023-12-29 VITALS — BP 106/72 | HR 62 | Ht 66.0 in | Wt 220.6 lb

## 2023-12-29 DIAGNOSIS — R7303 Prediabetes: Secondary | ICD-10-CM

## 2023-12-29 DIAGNOSIS — E66811 Obesity, class 1: Secondary | ICD-10-CM

## 2023-12-29 DIAGNOSIS — E559 Vitamin D deficiency, unspecified: Secondary | ICD-10-CM | POA: Diagnosis not present

## 2023-12-29 DIAGNOSIS — E7849 Other hyperlipidemia: Secondary | ICD-10-CM | POA: Diagnosis not present

## 2023-12-29 DIAGNOSIS — E538 Deficiency of other specified B group vitamins: Secondary | ICD-10-CM | POA: Diagnosis not present

## 2023-12-29 DIAGNOSIS — F331 Major depressive disorder, recurrent, moderate: Secondary | ICD-10-CM

## 2023-12-29 DIAGNOSIS — J4521 Mild intermittent asthma with (acute) exacerbation: Secondary | ICD-10-CM

## 2023-12-29 DIAGNOSIS — Z6833 Body mass index (BMI) 33.0-33.9, adult: Secondary | ICD-10-CM

## 2023-12-29 DIAGNOSIS — E6609 Other obesity due to excess calories: Secondary | ICD-10-CM

## 2023-12-29 DIAGNOSIS — G250 Essential tremor: Secondary | ICD-10-CM

## 2023-12-29 LAB — BAYER DCA HB A1C WAIVED: HB A1C (BAYER DCA - WAIVED): 6.2 % — ABNORMAL HIGH (ref 4.8–5.6)

## 2023-12-29 MED ORDER — ATORVASTATIN CALCIUM 40 MG PO TABS: 40.0000 mg | ORAL_TABLET | Freq: Every day | ORAL | 1 refills | Status: DC

## 2023-12-29 MED ORDER — BUPROPION HCL ER (XL) 150 MG PO TB24: 150.0000 mg | ORAL_TABLET | Freq: Every day | ORAL | 1 refills | Status: DC

## 2023-12-29 MED ORDER — BUDESONIDE-FORMOTEROL FUMARATE 80-4.5 MCG/ACT IN AERO: 2.0000 | INHALATION_SPRAY | Freq: Two times a day (BID) | RESPIRATORY_TRACT | 6 refills | Status: DC

## 2023-12-29 MED ORDER — TIRZEPATIDE-WEIGHT MANAGEMENT 2.5 MG/0.5ML ~~LOC~~ SOLN: 2.5000 mg | SUBCUTANEOUS | 0 refills | Status: DC

## 2023-12-29 MED ORDER — GABAPENTIN 400 MG PO CAPS: 400.0000 mg | ORAL_CAPSULE | Freq: Every day | ORAL | 1 refills | Status: DC

## 2023-12-29 MED ORDER — ALBUTEROL SULFATE HFA 108 (90 BASE) MCG/ACT IN AERS: 2.0000 | INHALATION_SPRAY | Freq: Four times a day (QID) | RESPIRATORY_TRACT | 6 refills | Status: AC | PRN

## 2023-12-29 MED ORDER — TIRZEPATIDE-WEIGHT MANAGEMENT 5 MG/0.5ML ~~LOC~~ SOLN: 5.0000 mg | SUBCUTANEOUS | 0 refills | Status: DC

## 2023-12-29 MED ORDER — SERTRALINE HCL 100 MG PO TABS: 100.0000 mg | ORAL_TABLET | Freq: Every day | ORAL | 1 refills | Status: DC

## 2023-12-29 MED ORDER — BUPROPION HCL ER (XL) 300 MG PO TB24: 300.0000 mg | ORAL_TABLET | Freq: Every day | ORAL | 1 refills | Status: DC

## 2023-12-29 MED ORDER — TRAZODONE HCL 100 MG PO TABS: 50.0000 mg | ORAL_TABLET | Freq: Every day | ORAL | Status: DC

## 2023-12-29 NOTE — Assessment & Plan Note (Signed)
 Rechecking labs today. Await results. Treat as needed.

## 2023-12-29 NOTE — Progress Notes (Signed)
 BP 106/72   Pulse 62   Ht 5\' 6"  (1.676 m)   Wt 220 lb 9.6 oz (100.1 kg)   SpO2 96%   BMI 35.61 kg/m    Subjective:    Patient ID: Emma Rogers, female    DOB: Jun 21, 1966, 58 y.o.   MRN: [id number]  HPI: Emma Rogers is a 58 y.o. female  Chief Complaint  Patient presents with   Depression   Hyperlipidemia   Hypertension   DEPRESSION Mood status: better Satisfied with current treatment?: yes Symptom severity: mild  Duration of current treatment : chronic Side effects: no Medication compliance: excellent compliance Psychotherapy/counseling: no  Previous psychiatric medications: sertraline, wellbutrin Depressed mood: yes Anxious mood: yes Anhedonia: no Significant weight loss or gain: no Insomnia: no  Fatigue: yes Feelings of worthlessness or guilt: no Impaired concentration/indecisiveness: no Suicidal ideations: no Hopelessness: no Crying spells: no    11/06/2023    9:54 AM 06/30/2023    3:24 PM 01/28/2023    4:16 PM 09/09/2022    3:08 PM 08/12/2022    3:04 PM  Depression screen PHQ 2/9  Decreased Interest 0 3 0 0 0  Down, Depressed, Hopeless 0 2 0 1 0  PHQ - 2 Score 0 5 0 1 0  Altered sleeping 2 3 2 2 2   Tired, decreased energy 3 2 3 2 2   Change in appetite 0 2 0 0 0  Feeling bad or failure about yourself  0 0 0 0 0  Trouble concentrating 0 2 0 1 1  Moving slowly or fidgety/restless 0 0 0 0 0  Suicidal thoughts 0 0 0 0 0  PHQ-9 Score 5 14 5 6 5   Difficult doing work/chores Somewhat difficult Somewhat difficult Not difficult at all Somewhat difficult Not difficult at all   HYPERTENSION / HYPERLIPIDEMIA Satisfied with current treatment? yes Duration of hypertension: chronic BP monitoring frequency: not checking BP medication side effects: no Past BP meds: lasix Duration of hyperlipidemia: chronic Cholesterol medication side effects: no Cholesterol supplements: none Past cholesterol medications: atorvastatin Medication compliance: excellent  compliance Aspirin: no Recent stressors: no Recurrent headaches: no Visual changes: no Palpitations: no Dyspnea: no Chest pain: no Lower extremity edema: no Dizzy/lightheaded: no  Impaired Fasting Glucose HbA1C:  Lab Results  Component Value Date   HGBA1C 6.1 (H) 06/30/2023   Duration of elevated blood sugar: chronic Polydipsia: no Polyuria: no Weight change: yes Visual disturbance: no Glucose Monitoring: no Diabetic Education: Not Completed Family history of diabetes: yes  OBESITY Duration: chronic Previous attempts at weight loss: yes, diet, exercise, portion control Complications of obesity: IFG, HLD, HTN, CHF, GERD, low back pain Peak weight: 221lbs Weight loss goal: to be healthy Weight loss to date: none Requesting obesity pharmacotherapy: yes Current weight loss supplements/medications: no Previous weight loss supplements/meds: yes- wegovy   Relevant past medical, surgical, family and social history reviewed and updated as indicated. Interim medical history since our last visit reviewed. Allergies and medications reviewed and updated.  Review of Systems  Constitutional:  Positive for fatigue. Negative for activity change, appetite change, chills, diaphoresis, fever and unexpected weight change.  Respiratory: Negative.    Cardiovascular: Negative.   Gastrointestinal: Negative.   Musculoskeletal: Negative.   Neurological: Negative.   Psychiatric/Behavioral: Negative.      Per HPI unless specifically indicated above     Objective:    BP 106/72   Pulse 62   Ht 5\' 6"  (1.676 m)   Wt 220 lb 9.6 oz (100.1 kg)  SpO2 96%   BMI 35.61 kg/m   Wt Readings from Last 3 Encounters:  12/29/23 220 lb 9.6 oz (100.1 kg)  12/10/23 221 lb 6.4 oz (100.4 kg)  07/08/23 211 lb 10.3 oz (96 kg)    Physical Exam Vitals and nursing note reviewed.  Constitutional:      General: She is not in acute distress.    Appearance: Normal appearance. She is obese. She is not  ill-appearing, toxic-appearing or diaphoretic.  HENT:     Head: Normocephalic and atraumatic.     Right Ear: External ear normal.     Left Ear: External ear normal.     Nose: Nose normal.     Mouth/Throat:     Mouth: Mucous membranes are moist.     Pharynx: Oropharynx is clear.  Eyes:     General: No scleral icterus.       Right eye: No discharge.        Left eye: No discharge.     Extraocular Movements: Extraocular movements intact.     Conjunctiva/sclera: Conjunctivae normal.     Pupils: Pupils are equal, round, and reactive to light.  Cardiovascular:     Rate and Rhythm: Normal rate and regular rhythm.     Pulses: Normal pulses.     Heart sounds: Normal heart sounds. No murmur heard.    No friction rub. No gallop.  Pulmonary:     Effort: Pulmonary effort is normal. No respiratory distress.     Breath sounds: Normal breath sounds. No stridor. No wheezing, rhonchi or rales.  Chest:     Chest wall: No tenderness.  Musculoskeletal:        General: Normal range of motion.     Cervical back: Normal range of motion and neck supple.  Skin:    General: Skin is warm and dry.     Capillary Refill: Capillary refill takes less than 2 seconds.     Coloration: Skin is not jaundiced or pale.     Findings: No bruising, erythema, lesion or rash.  Neurological:     General: No focal deficit present.     Mental Status: She is alert and oriented to person, place, and time. Mental status is at baseline.  Psychiatric:        Mood and Affect: Mood normal.        Behavior: Behavior normal.        Thought Content: Thought content normal.        Judgment: Judgment normal.     Results for orders placed or performed in visit on 12/21/23  CUP PACEART REMOTE DEVICE CHECK   Collection Time: 12/22/23 10:10 AM  Result Value Ref Range   Date Time Interrogation Session [id number]    Pulse Generator Manufacturer MERM    Pulse Gen Model [id number] Adapta    Pulse Gen Serial Number [id number]     Clinic Name [facility]    Implantable Pulse Generator Type Implantable Pulse Generator    Implantable Pulse Generator Implant Date [id number]    Implantable Lead Manufacturer MERM    Implantable Lead Model 4524 CapSure SP    Implantable Lead Serial Number [id number]    Implantable Lead Implant Date [id number]    Implantable Lead Location Detail 1 APPENDAGE    Implantable Lead Location [id number]    Implantable Lead Connection Status [id number]    Implantable Lead Manufacturer MERM    Implantable Lead Model [id number] CapSure Z    Implantable Lead Serial Number [id number]  Implantable Lead Implant Date [id number]    Implantable Lead Location Detail 1 APEX    Implantable Lead Location [id number]    Implantable Lead Connection Status [id number]    Lead Channel Setting Sensing Sensitivity 2.00 mV   Lead Channel Setting Pacing Amplitude 3.000 V   Lead Channel Setting Pacing Pulse Width 0.40 ms   Lead Channel Setting Pacing Amplitude 3.250 V   Zone Setting Status [id number]    Zone Setting Status [phone]    Lead Channel Impedance Value 289 ohm   Lead Channel Pacing Threshold Amplitude 1.500 V   Lead Channel Pacing Threshold Pulse Width 0.40 ms   Lead Channel Impedance Value 1,140 ohm   Lead Channel Pacing Threshold Amplitude 1.625 V   Lead Channel Pacing Threshold Pulse Width 0.40 ms   Battery Status OK    Battery Remaining Longevity 27 mo   Battery Voltage 2.74 V   Battery Impedance 2,294 ohm   Brady Statistic AP VP Percent 0 %   Brady Statistic AS VP Percent 0 %   Brady Statistic AP VS Percent 35 %   Brady Statistic AS VS Percent 64 %      Assessment & Plan:   Problem List Items Addressed This Visit       Respiratory   RESOLVED: Mild intermittent asthma with exacerbation   Relevant Medications   budesonide-formoterol (SYMBICORT) 80-4.5 MCG/ACT inhaler   albuterol (VENTOLIN HFA) 108 (90 Base) MCG/ACT inhaler     Nervous and Auditory   Benign essential tremor   Worsening. Will get her into  neurology. Referral placed today.       Relevant Orders   Ambulatory referral to Neurology     Other   Prediabetes   Rechecking labs today. Await results. Treat as needed.       Relevant Orders   Comprehensive metabolic panel with GFR   Bayer DCA Hb A1c Waived   CBC with Differential/Platelet   Vitamin D deficiency   Rechecking labs today. Await results. Treat as needed.       Relevant Orders   VITAMIN D 25 Hydroxy (Vit-D Deficiency, Fractures)   Comprehensive metabolic panel with GFR   CBC with Differential/Platelet   Vitamin B12 deficiency   Rechecking labs today. Await results. Treat as needed.       Relevant Orders   Comprehensive metabolic panel with GFR   CBC with Differential/Platelet   B12   Familial hyperlipidemia   Under good control on current regimen. Continue current regimen. Continue to monitor. Call with any concerns. Refills given. Labs drawn today.        Relevant Medications   atorvastatin (LIPITOR) 40 MG tablet   Other Relevant Orders   Comprehensive metabolic panel with GFR   Lipid Panel w/o Chol/HDL Ratio   CBC with Differential/Platelet   MDD (major depressive disorder), recurrent episode, moderate (HCC) - Primary   Tolerating her medicine well. Feeling much better. Continue current regimen. Continue to monitor. Call with any concerns. Refills given.       Relevant Medications   traZODone (DESYREL) 100 MG tablet   sertraline (ZOLOFT) 100 MG tablet   buPROPion (WELLBUTRIN XL) 150 MG 24 hr tablet   buPROPion (WELLBUTRIN XL) 300 MG 24 hr tablet   Other Relevant Orders   Comprehensive metabolic panel with GFR   CBC with Differential/Platelet   Class 1 obesity due to excess calories with serious comorbidity and body mass index (BMI) of 32.0 to 32.9 in adult   Will check on  coverage for zepbound and wegovy. If covered we'll start. Discussed vial program- if she'd like to start that, we can send it in. Call with any concerns.         Follow  up plan: Return in about 6 weeks (around 02/09/2024).

## 2023-12-29 NOTE — Patient Instructions (Signed)
 Zepbound or [employer]- check with insurance

## 2023-12-29 NOTE — Assessment & Plan Note (Signed)
 Worsening. Will get her into neurology. Referral placed today.

## 2023-12-29 NOTE — Assessment & Plan Note (Signed)
 Under good control on current regimen. Continue current regimen. Continue to monitor. Call with any concerns. Refills given. Labs drawn today.

## 2023-12-29 NOTE — Assessment & Plan Note (Signed)
 Will check on coverage for zepbound and wegovy. If covered we'll start. Discussed vial program- if she'd like to start that, we can send it in. Call with any concerns.

## 2023-12-29 NOTE — Assessment & Plan Note (Signed)
 Tolerating her medicine well. Feeling much better. Continue current regimen. Continue to monitor. Call with any concerns. Refills given.

## 2023-12-30 LAB — COMPREHENSIVE METABOLIC PANEL WITH GFR
ALT: 16 IU/L (ref 0–32)
AST: 17 IU/L (ref 0–40)
Albumin: 4.1 g/dL (ref 3.8–4.9)
Alkaline Phosphatase: 79 IU/L (ref 44–121)
BUN/Creatinine Ratio: 17 (ref 9–23)
BUN: 22 mg/dL (ref 6–24)
Bilirubin Total: 0.5 mg/dL (ref 0.0–1.2)
CO2: 23 mmol/L (ref 20–29)
Calcium: 9.3 mg/dL (ref 8.7–10.2)
Chloride: 102 mmol/L (ref 96–106)
Creatinine, Ser: 1.27 mg/dL — ABNORMAL HIGH (ref 0.57–1.00)
Globulin, Total: 2.2 g/dL (ref 1.5–4.5)
Glucose: 104 mg/dL — ABNORMAL HIGH (ref 70–99)
Potassium: 4.4 mmol/L (ref 3.5–5.2)
Sodium: 140 mmol/L (ref 134–144)
Total Protein: 6.3 g/dL (ref 6.0–8.5)
eGFR: 50 mL/min/{1.73_m2} — ABNORMAL LOW (ref >59)

## 2023-12-30 LAB — CBC WITH DIFFERENTIAL/PLATELET
Basophils Absolute: 0.1 10*3/uL (ref 0.0–0.2)
Basos: 1 %
EOS (ABSOLUTE): 0.3 10*3/uL (ref 0.0–0.4)
Eos: 5 %
Hematocrit: 40 % (ref 34.0–46.6)
Hemoglobin: 13.3 g/dL (ref 11.1–15.9)
Immature Grans (Abs): 0 10*3/uL (ref 0.0–0.1)
Immature Granulocytes: 0 %
Lymphocytes Absolute: 1.6 10*3/uL (ref 0.7–3.1)
Lymphs: 29 %
MCH: 31.1 pg (ref 26.6–33.0)
MCHC: 33.3 g/dL (ref 31.5–35.7)
MCV: 94 fL (ref 79–97)
Monocytes Absolute: 0.4 10*3/uL (ref 0.1–0.9)
Monocytes: 7 %
Neutrophils Absolute: 3.2 10*3/uL (ref 1.4–7.0)
Neutrophils: 58 %
Platelets: 292 10*3/uL (ref 150–450)
RBC: 4.28 x10E6/uL (ref 3.77–5.28)
RDW: 14.3 % (ref 11.7–15.4)
WBC: 5.6 10*3/uL (ref 3.4–10.8)

## 2023-12-30 LAB — LIPID PANEL W/O CHOL/HDL RATIO
Cholesterol, Total: 197 mg/dL (ref 100–199)
HDL: 61 mg/dL (ref >39)
LDL Chol Calc (NIH): 117 mg/dL — ABNORMAL HIGH (ref 0–99)
Triglycerides: 104 mg/dL (ref 0–149)
VLDL Cholesterol Cal: 19 mg/dL (ref 5–40)

## 2023-12-30 LAB — VITAMIN B12: Vitamin B-12: 313 pg/mL (ref 232–1245)

## 2023-12-30 LAB — VITAMIN D 25 HYDROXY (VIT D DEFICIENCY, FRACTURES): Vit D, 25-Hydroxy: 31.8 ng/mL (ref 30.0–100.0)

## 2024-01-04 ENCOUNTER — Other Ambulatory Visit: Payer: Self-pay | Admitting: Family Medicine

## 2024-01-05 NOTE — Telephone Encounter (Signed)
 Requested Prescriptions  Pending Prescriptions Disp Refills   furosemide (LASIX) 20 MG tablet [Pharmacy Med Name: FUROSEMIDE 20 MG TABLET] 90 tablet 1    Sig: TAKE 1 TABLET BY MOUTH EVERY DAY AS NEEDED     Cardiovascular:  Diuretics - Loop Failed - 01/05/2024  2:24 PM      Failed - Cr in normal range and within 180 days    Creat  Date Value Ref Range Status  03/30/2023 0.95 0.50 - 1.03 mg/dL Final   Creatinine, Ser  Date Value Ref Range Status  12/29/2023 1.27 (H) 0.57 - 1.00 mg/dL Final         Failed - Mg Level in normal range and within 180 days    Magnesium  Date Value Ref Range Status  01/28/2023 2.2 1.6 - 2.3 mg/dL Final         Passed - K in normal range and within 180 days    Potassium  Date Value Ref Range Status  12/29/2023 4.4 3.5 - 5.2 mmol/L Final  06/15/2014 3.3 (L) 3.5 - 5.1 mmol/L Final         Passed - Ca in normal range and within 180 days    Calcium  Date Value Ref Range Status  12/29/2023 9.3 8.7 - 10.2 mg/dL Final   Calcium, Total  Date Value Ref Range Status  06/15/2014 9.2 8.5 - 10.1 mg/dL Final         Passed - Na in normal range and within 180 days    Sodium  Date Value Ref Range Status  12/29/2023 140 134 - 144 mmol/L Final  06/15/2014 135 (L) 136 - 145 mmol/L Final         Passed - Cl in normal range and within 180 days    Chloride  Date Value Ref Range Status  12/29/2023 102 96 - 106 mmol/L Final  06/15/2014 98 98 - 107 mmol/L Final         Passed - Last BP in normal range    BP Readings from Last 1 Encounters:  12/29/23 106/72         Passed - Valid encounter within last 6 months    Recent Outpatient Visits           1 week ago MDD (major depressive disorder), recurrent episode, moderate (HCC)   [place] [facility] [place], Megan P, DO   2 months ago MDD (major depressive disorder), recurrent episode, moderate (HCC)   [place] [facility] [place], Megan P, DO   1 year ago Encounter for  medication management   [facility] [place] Valente Gaskin, RPH-CPP   2 years ago Encounter for medication management   [place] [facility] [place] Valente Gaskin, RPH-CPP   3 years ago Encounter for medication review    [facility] [place] Valente Gaskin, RPH-CPP

## 2024-01-07 ENCOUNTER — Other Ambulatory Visit: Payer: Self-pay | Admitting: Neurosurgery

## 2024-01-07 DIAGNOSIS — M48062 Spinal stenosis, lumbar region with neurogenic claudication: Secondary | ICD-10-CM

## 2024-01-13 ENCOUNTER — Ambulatory Visit
Admission: RE | Admit: 2024-01-13 | Discharge: 2024-01-13 | Disposition: A | Discharge status: Home / Self Care | Source: Ambulatory Visit | Attending: Neurosurgery | Admitting: Neurosurgery

## 2024-01-13 DIAGNOSIS — M545 Low back pain, unspecified: Secondary | ICD-10-CM | POA: Diagnosis not present

## 2024-01-13 DIAGNOSIS — M48062 Spinal stenosis, lumbar region with neurogenic claudication: Secondary | ICD-10-CM

## 2024-01-13 DIAGNOSIS — M25552 Pain in left hip: Secondary | ICD-10-CM | POA: Diagnosis not present

## 2024-01-13 DIAGNOSIS — Z981 Arthrodesis status: Secondary | ICD-10-CM | POA: Diagnosis not present

## 2024-02-02 NOTE — Addendum Note (Signed)
 Addended by: Edra Govern D on: 02/02/2024 04:47 PM   Modules accepted: Orders

## 2024-02-02 NOTE — Progress Notes (Signed)
 Remote pacemaker transmission.

## 2024-02-03 ENCOUNTER — Other Ambulatory Visit: Payer: Self-pay

## 2024-02-03 ENCOUNTER — Other Ambulatory Visit: Payer: Self-pay | Admitting: Pharmacy Technician

## 2024-02-03 NOTE — Progress Notes (Signed)
 Disenrolled;  Last filled 08/2023

## 2024-02-08 ENCOUNTER — Ambulatory Visit: Admitting: Pulmonary Disease

## 2024-02-08 ENCOUNTER — Other Ambulatory Visit
Admission: RE | Admit: 2024-02-08 | Discharge: 2024-02-08 | Disposition: A | Discharge status: Home / Self Care | Source: Ambulatory Visit | Attending: Pulmonary Disease | Admitting: Pulmonary Disease

## 2024-02-08 ENCOUNTER — Encounter: Payer: Self-pay | Admitting: Pulmonary Disease

## 2024-02-08 VITALS — BP 112/78 | HR 60 | Temp 97.1°F | Ht 66.0 in | Wt 221.0 lb

## 2024-02-08 DIAGNOSIS — Z7722 Contact with and (suspected) exposure to environmental tobacco smoke (acute) (chronic): Secondary | ICD-10-CM | POA: Diagnosis not present

## 2024-02-08 DIAGNOSIS — R0602 Shortness of breath: Secondary | ICD-10-CM | POA: Diagnosis not present

## 2024-02-08 LAB — BRAIN NATRIURETIC PEPTIDE: B Natriuretic Peptide: 56 pg/mL (ref 0.0–100.0)

## 2024-02-08 NOTE — Progress Notes (Signed)
 Synopsis: Referred in by Lubertha Rush, MD   Subjective:   PATIENT ID: Emma Rogers GENDER: female DOB: 23-Feb-1966, MRN: [id number]  Chief Complaint  Patient presents with   Follow-up    DOE. Some wheezing. Cough with yellow sputum/     HPI Ms. Menard is a pleasant 58 year old female patient with a past medical history of psoriatic arthritis on Tremfya  every 8 weeks, asthma as a child presenting today to the pulmonary clinic to establish care.  She reports that she had a URI early May and coughed up some blood-tinged sputum for which she went to the emergency department.  She had a CTA chest done that ruled out PE but did show mild ill-defined patchy area of groundglass opacity bilaterally but mostly on the left lower lobe consistent with an inflammatory process.  She has recovered did not have any hemoptysis since.  She is pending an echocardiogram per cardiology on June 10.  Her asthma she was diagnosed as a child not requiring any maintenance inhaler for years.  She does have hypersensitivity to cold air and strong scents and high humidity.  She does have allergic rhinitis.  Family history mother with asthma and grandmother with asthma as well.  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  Currently is on disability due to back surgeries.  ROS All systems were reviewed and are negative except for the above.  Objective:   Vitals:   02/08/24 1110  BP: 112/78  Pulse: 60  Temp: (!) 97.1 F (36.2 C)  SpO2: 94%  Weight: 221 lb (100.2 kg)  Height: 5\' 6"  (1.676 m)   94% on RA BMI Readings from Last 3 Encounters:  02/08/24 35.67 kg/m  12/29/23 35.61 kg/m  12/10/23 37.42 kg/m   Wt Readings from Last 3 Encounters:  02/08/24 221 lb (100.2 kg)  12/29/23 220 lb 9.6 oz (100.1 kg)  12/10/23 221 lb 6.4 oz (100.4 kg)    Physical Exam GEN: NAD, Healthy Appearing HEENT: Supple Neck, Reactive Pupils, EOMI  CVS: Normal S1, Normal S2, RRR, No  murmurs or ES appreciated  Lungs: Clear bilateral air entry.  Abdomen: Soft, non tender, non distended, + BS  Extremities: Warm and well perfused, No edema  Skin: No suspicious lesions appreciated  Psych: Normal Affect  Ancillary Information   CBC    Component Value Date/Time   WBC 5.6 12/29/2023 1150   WBC 6.7 11/28/2023 2025   RBC 4.28 12/29/2023 1150   RBC 4.34 11/28/2023 2025   HGB 13.3 12/29/2023 1150   HCT 40.0 12/29/2023 1150   PLT 292 12/29/2023 1150   MCV 94 12/29/2023 1150   MCV 92 06/15/2014 2324   MCH 31.1 12/29/2023 1150   MCH 30.6 11/28/2023 2025   MCHC 33.3 12/29/2023 1150   MCHC 33.3 11/28/2023 2025   RDW 14.3 12/29/2023 1150   RDW 13.0 06/15/2014 2324   LYMPHSABS 1.6 12/29/2023 1150   MONOABS 0.3 01/14/2019 1314   EOSABS 0.3 12/29/2023 1150   BASOSABS 0.1 12/29/2023 1150   Labs and imaging were reviewed.      No data to display           Assessment & Plan:  Ms. Maahs is a pleasant 58 year old female patient with a past medical history of psoriatic arthritis on Tremfya  every 8 weeks, asthma as a child presenting today to the pulmonary clinic to establish care.  #URI, hemoptysis and CT with mild aspecific GGO  #Intermittent asthma   []   Repeat CT chest wo contrast.  []  PFTs  []  Albuterol  as needed and will decided on maintenance therapy based on the above.  []  Allergen panel and BNP  Return in about 4 months (around 06/10/2024).  I spent 60 minutes caring for this patient today, including preparing to see the patient, obtaining a medical history , reviewing a separately obtained history, performing a medically appropriate examination and/or evaluation, counseling and educating the patient/family/caregiver, documenting clinical information in the electronic health record, and independently interpreting results (not separately reported/billed) and communicating results to the patient/family/caregiver  Annitta Kindler, MD [facility] 02/08/2024 11:41 AM

## 2024-02-09 DIAGNOSIS — M5440 Lumbago with sciatica, unspecified side: Secondary | ICD-10-CM | POA: Diagnosis not present

## 2024-02-09 LAB — ALLERGEN PANEL (27) + IGE
Alternaria Alternata IgE: <0.1 kU/L
Aspergillus Fumigatus IgE: <0.1 kU/L
Bahia Grass IgE: <0.1 kU/L
Bermuda Grass IgE: <0.1 kU/L
Cat Dander IgE: <0.1 kU/L
Cedar, Mountain IgE: <0.1 kU/L
Cladosporium Herbarum IgE: <0.1 kU/L
Cocklebur IgE: <0.1 kU/L
Cockroach, American IgE: <0.1 kU/L
Common Silver Birch IgE: <0.1 kU/L
D Farinae IgE: <0.1 kU/L
D Pteronyssinus IgE: <0.1 kU/L
Dog Dander IgE: <0.1 kU/L
Elm, American IgE: <0.1 kU/L
Hickory, White IgE: <0.1 kU/L
IgE (Immunoglobulin E), Serum: 3 [IU]/mL — ABNORMAL LOW (ref 6–495)
Johnson Grass IgE: <0.1 kU/L
Kentucky Bluegrass IgE: <0.1 kU/L
Maple/Box Elder IgE: <0.1 kU/L
Mucor Racemosus IgE: <0.1 kU/L
Oak, White IgE: <0.1 kU/L
Penicillium Chrysogen IgE: <0.1 kU/L
Pigweed, Rough IgE: <0.1 kU/L
Plantain, English IgE: <0.1 kU/L
Ragweed, Short IgE: <0.1 kU/L
Setomelanomma Rostrat: <0.1 kU/L
Timothy Grass IgE: <0.1 kU/L
White Mulberry IgE: <0.1 kU/L

## 2024-02-12 ENCOUNTER — Ambulatory Visit
Admission: RE | Admit: 2024-02-12 | Discharge: 2024-02-12 | Disposition: A | Discharge status: Home / Self Care | Source: Ambulatory Visit | Attending: Pulmonary Disease | Admitting: Pulmonary Disease

## 2024-02-12 ENCOUNTER — Ambulatory Visit

## 2024-02-12 ENCOUNTER — Encounter: Payer: Self-pay | Admitting: Family Medicine

## 2024-02-12 ENCOUNTER — Ambulatory Visit: Admitting: Family Medicine

## 2024-02-12 VITALS — BP 106/71 | HR 71 | Temp 98.6°F | Resp 15 | Ht 65.98 in | Wt 223.2 lb

## 2024-02-12 DIAGNOSIS — F331 Major depressive disorder, recurrent, moderate: Secondary | ICD-10-CM | POA: Diagnosis not present

## 2024-02-12 DIAGNOSIS — R0683 Snoring: Secondary | ICD-10-CM

## 2024-02-12 DIAGNOSIS — E66812 Obesity, class 2: Secondary | ICD-10-CM | POA: Insufficient documentation

## 2024-02-12 DIAGNOSIS — R0602 Shortness of breath: Secondary | ICD-10-CM | POA: Diagnosis not present

## 2024-02-12 DIAGNOSIS — R918 Other nonspecific abnormal finding of lung field: Secondary | ICD-10-CM | POA: Diagnosis not present

## 2024-02-12 DIAGNOSIS — R062 Wheezing: Secondary | ICD-10-CM | POA: Diagnosis not present

## 2024-02-12 DIAGNOSIS — Z6835 Body mass index (BMI) 35.0-35.9, adult: Secondary | ICD-10-CM

## 2024-02-12 MED ORDER — SERTRALINE HCL 100 MG PO TABS: 150.0000 mg | ORAL_TABLET | Freq: Every day | ORAL | 0 refills | Status: DC

## 2024-02-12 NOTE — Assessment & Plan Note (Signed)
 Would like to see healthy weight and wellness. Has not been able to get zepbound or wegovy  due to coverage. Call with any concerns.

## 2024-02-12 NOTE — Progress Notes (Signed)
 BP 106/71 (BP Location: Left Arm, Patient Position: Sitting, Cuff Size: Large)   Pulse 71   Temp 98.6 F (37 C) (Oral)   Resp 15   Ht 5' 5.98" (1.676 m)   Wt 223 lb 3.2 oz (101.2 kg)   SpO2 96%   BMI 36.04 kg/m    Subjective:    Patient ID: Emma Rogers, female    DOB: [birth year], 58 y.o.   MRN: [id number]  HPI: Emma Rogers is a 58 y.o. female  Chief Complaint  Patient presents with   Depression    Not doing well right now   Obesity    Affecting her mood and made worse by not being able to exercise due to back surgeries.    DEPRESSION Mood status: uncontrolled Satisfied with current treatment?: no Symptom severity: moderate  Duration of current treatment : chronic Side effects: no Medication compliance: good compliance Psychotherapy/counseling: no  Previous psychiatric medications: sertraline , wellbutrin  Depressed mood: yes Anxious mood: yes Anhedonia: no Significant weight loss or gain: yes Insomnia: yes hard to fall asleep Fatigue: yes Feelings of worthlessness or guilt: yes Impaired concentration/indecisiveness: yes Suicidal ideations: no Hopelessness: yes Crying spells: yes    02/12/2024    1:58 PM 11/06/2023    9:54 AM 06/30/2023    3:24 PM 01/28/2023    4:16 PM 09/09/2022    3:08 PM  Depression screen PHQ 2/9  Decreased Interest 0 0 3 0 0  Down, Depressed, Hopeless 2 0 2 0 1  PHQ - 2 Score 2 0 5 0 1  Altered sleeping 3 2 3 2 2   Tired, decreased energy 3 3 2 3 2   Change in appetite 0 0 2 0 0  Feeling bad or failure about yourself  1 0 0 0 0  Trouble concentrating 0 0 2 0 1  Moving slowly or fidgety/restless 0 0 0 0 0  Suicidal thoughts 0 0 0 0 0  PHQ-9 Score 9 5 14 5 6   Difficult doing work/chores Somewhat difficult Somewhat difficult Somewhat difficult Not difficult at all Somewhat difficult   ????SLEEP APNEA Sleep apnea status: uncontrolled Duration: chronic Satisfied with current treatment?:  no CPAP use:  no Last sleep study: few years  ago Wakes feeling refreshed:  no Daytime hypersomnolence:  yes Fatigue:  yes Insomnia:  yes Good sleep hygiene:  yes Difficulty falling asleep:  yes Difficulty staying asleep:  yes Snoring bothers bed partner:  yes Observed apnea by bed partner: yes Obesity:  yes Hypertension: no  Pulmonary hypertension:  no Coronary artery disease:  no    02/02/2019   11:00 AM  Results of the Epworth flowsheet  Sitting and reading 1  Watching TV 0  Sitting, inactive in a public place (e.g. a theatre or a meeting) 0  As a passenger in a car for an hour without a break 3  Lying down to rest in the afternoon when circumstances permit 0  Sitting and talking to someone 0  Sitting quietly after a lunch without alcohol 0  In a car, while stopped for a few minutes in traffic 0  Total score 4    Relevant past medical, surgical, family and social history reviewed and updated as indicated. Interim medical history since our last visit reviewed. Allergies and medications reviewed and updated.  Review of Systems  Constitutional: Negative.   Respiratory: Negative.    Cardiovascular: Negative.   Musculoskeletal: Negative.   Psychiatric/Behavioral:  Positive for dysphoric mood. Negative for agitation, behavioral problems, confusion,  decreased concentration, hallucinations, self-injury, sleep disturbance and suicidal ideas. The patient is nervous/anxious. The patient is not hyperactive.     Per HPI unless specifically indicated above     Objective:     BP 106/71 (BP Location: Left Arm, Patient Position: Sitting, Cuff Size: Large)   Pulse 71   Temp 98.6 F (37 C) (Oral)   Resp 15   Ht 5' 5.98" (1.676 m)   Wt 223 lb 3.2 oz (101.2 kg)   SpO2 96%   BMI 36.04 kg/m   Wt Readings from Last 3 Encounters:  02/12/24 223 lb 3.2 oz (101.2 kg)  02/08/24 221 lb (100.2 kg)  12/29/23 220 lb 9.6 oz (100.1 kg)    Physical Exam Vitals and nursing note reviewed.  Constitutional:      General: She is not in  acute distress.    Appearance: Normal appearance. She is obese. She is not ill-appearing, toxic-appearing or diaphoretic.  HENT:     Head: Normocephalic and atraumatic.     Right Ear: External ear normal.     Left Ear: External ear normal.     Nose: Nose normal.     Mouth/Throat:     Mouth: Mucous membranes are moist.     Pharynx: Oropharynx is clear.  Eyes:     General: No scleral icterus.       Right eye: No discharge.        Left eye: No discharge.     Extraocular Movements: Extraocular movements intact.     Conjunctiva/sclera: Conjunctivae normal.     Pupils: Pupils are equal, round, and reactive to light.  Cardiovascular:     Rate and Rhythm: Normal rate and regular rhythm.     Pulses: Normal pulses.     Heart sounds: Normal heart sounds. No murmur heard.    No friction rub. No gallop.  Pulmonary:     Effort: Pulmonary effort is normal. No respiratory distress.     Breath sounds: Normal breath sounds. No stridor. No wheezing, rhonchi or rales.  Chest:     Chest wall: No tenderness.  Musculoskeletal:        General: Normal range of motion.     Cervical back: Normal range of motion and neck supple.  Skin:    General: Skin is warm and dry.     Capillary Refill: Capillary refill takes less than 2 seconds.     Coloration: Skin is not jaundiced or pale.     Findings: No bruising, erythema, lesion or rash.  Neurological:     General: No focal deficit present.     Mental Status: She is alert and oriented to person, place, and time. Mental status is at baseline.  Psychiatric:        Mood and Affect: Mood normal.        Behavior: Behavior normal.        Thought Content: Thought content normal.        Judgment: Judgment normal.     Results for orders placed or performed during the hospital encounter of 02/08/24  Brain natriuretic peptide   Collection Time: 02/08/24 12:44 PM  Result Value Ref Range   B Natriuretic Peptide 56.0 0.0 - 100.0 pg/mL  Allergen Panel (27) + IGE    Collection Time: 02/08/24 12:44 PM  Result Value Ref Range   Class Description Allergens Comment    IgE (Immunoglobulin E), Serum 3 (L) 6 - 495 IU/mL   D Pteronyssinus IgE <0.10 Class 0 kU/L  D Farinae IgE <0.10 Class 0 kU/L   Cat Dander IgE <0.10 Class 0 kU/L   Dog Dander IgE <0.10 Class 0 kU/L   [place] Grass IgE <0.10 Class 0 kU/L   Timothy Grass IgE <0.10 Class 0 kU/L   [place]  Bluegrass IgE <0.10 Class 0 kU/L   Latoya Diskin Grass IgE <0.10 Class 0 kU/L   Bahia Grass IgE <0.10 Class 0 kU/L   Cockroach, American IgE <0.10 Class 0 kU/L   Penicillium Chrysogen IgE <0.10 Class 0 kU/L   Cladosporium Herbarum IgE <0.10 Class 0 kU/L   Aspergillus Fumigatus IgE <0.10 Class 0 kU/L   Mucor Racemosus IgE <0.10 Class 0 kU/L   Alternaria Alternata IgE <0.10 Class 0 kU/L   Setomelanomma Rostrat <0.10 Class 0 kU/L   Oak, White IgE <0.10 Class 0 kU/L   Elm, American IgE <0.10 Class 0 kU/L   Maple/Box Elder IgE <0.10 Class 0 kU/L   Common Silver Amelia Jurist IgE <0.10 Class 0 kU/L   Hickory, White IgE <0.10 Class 0 kU/L   White Mulberry IgE <0.10 Class 0 kU/L   Cedar, [place] IgE <0.10 Class 0 kU/L   Ragweed, Short IgE <0.10 Class 0 kU/L   Plantain, English IgE <0.10 Class 0 kU/L   Cocklebur IgE <0.10 Class 0 kU/L   Pigweed, Rough IgE <0.10 Class 0 kU/L      Assessment & Plan:   Problem List Items Addressed This Visit       Other   MDD (major depressive disorder), recurrent episode, moderate (HCC) - Primary   Not doing well. List of counselors given today. Will increase her sertraline  to 150mg  and recheck in about 6 weeks. Call with any concerns.       Relevant Medications   sertraline  (ZOLOFT ) 100 MG tablet   Class 2 severe obesity due to excess calories with serious comorbidity and body mass index (BMI) of 35.0 to 35.9 in adult [facility])   Would like to see healthy weight and wellness. Has not been able to get zepbound or wegovy  due to coverage. Call with any concerns.       Relevant  Orders   Amb Ref to Medical Weight Management   Other Visit Diagnoses       Snoring       Will check sleep study for ?sleep apnea. Call with any concerns. Await results.   Relevant Orders   Ambulatory referral to Sleep Studies        Follow up plan: Return in about 6 weeks (around 03/25/2024) for virtual OK.

## 2024-02-12 NOTE — Assessment & Plan Note (Addendum)
 Not doing well. List of counselors given today. Will increase her sertraline  to 150mg  and recheck in about 6 weeks. Call with any concerns.

## 2024-02-16 ENCOUNTER — Ambulatory Visit (INDEPENDENT_AMBULATORY_CARE_PROVIDER_SITE_OTHER): Admitting: Adult Health

## 2024-02-16 ENCOUNTER — Encounter (INDEPENDENT_AMBULATORY_CARE_PROVIDER_SITE_OTHER): Payer: Self-pay | Admitting: Adult Health

## 2024-02-16 ENCOUNTER — Ambulatory Visit (INDEPENDENT_AMBULATORY_CARE_PROVIDER_SITE_OTHER): Admitting: Pulmonary Disease

## 2024-02-16 VITALS — BP 121/77 | HR 66 | Temp 98.3°F | Ht 65.0 in | Wt 220.0 lb

## 2024-02-16 DIAGNOSIS — E559 Vitamin D deficiency, unspecified: Secondary | ICD-10-CM | POA: Diagnosis not present

## 2024-02-16 DIAGNOSIS — F331 Major depressive disorder, recurrent, moderate: Secondary | ICD-10-CM

## 2024-02-16 DIAGNOSIS — Z0289 Encounter for other administrative examinations: Secondary | ICD-10-CM

## 2024-02-16 DIAGNOSIS — E538 Deficiency of other specified B group vitamins: Secondary | ICD-10-CM

## 2024-02-16 DIAGNOSIS — R7303 Prediabetes: Secondary | ICD-10-CM

## 2024-02-16 DIAGNOSIS — E669 Obesity, unspecified: Secondary | ICD-10-CM

## 2024-02-16 DIAGNOSIS — R0602 Shortness of breath: Secondary | ICD-10-CM

## 2024-02-16 DIAGNOSIS — Z6837 Body mass index (BMI) 37.0-37.9, adult: Secondary | ICD-10-CM

## 2024-02-16 LAB — PULMONARY FUNCTION TEST
DL/VA % pred: 113 %
DL/VA: 4.75 ml/min/mmHg/L
DLCO unc % pred: 94 %
DLCO unc: 20.59 ml/min/mmHg
FEF 25-75 Post: 3.11 L/s
FEF 25-75 Pre: 2.48 L/s
FEF2575-%Change-Post: 25 %
FEF2575-%Pred-Post: 118 %
FEF2575-%Pred-Pre: 94 %
FEV1-%Change-Post: 5 %
FEV1-%Pred-Post: 89 %
FEV1-%Pred-Pre: 84 %
FEV1-Post: 2.55 L
FEV1-Pre: 2.41 L
FEV1FVC-%Change-Post: -2 %
FEV1FVC-%Pred-Pre: 106 %
FEV6-%Change-Post: 9 %
FEV6-%Pred-Post: 87 %
FEV6-%Pred-Pre: 80 %
FEV6-Post: 3.1 L
FEV6-Pre: 2.84 L
FEV6FVC-%Pred-Post: 103 %
FEV6FVC-%Pred-Pre: 103 %
FVC-%Change-Post: 8 %
FVC-%Pred-Post: 84 %
FVC-%Pred-Pre: 78 %
FVC-Post: 3.1 L
FVC-Pre: 2.86 L
Post FEV1/FVC ratio: 82 %
Post FEV6/FVC ratio: 100 %
Pre FEV1/FVC ratio: 84 %
Pre FEV6/FVC Ratio: 100 %
RV % pred: 288 %
RV: 5.8 L
TLC % pred: 164 %
TLC: 8.82 L

## 2024-02-16 NOTE — Progress Notes (Signed)
 Full PFT completed today ? ?

## 2024-02-16 NOTE — Patient Instructions (Signed)
 Full PFT completed today ? ?

## 2024-02-16 NOTE — Progress Notes (Signed)
 Office: [phone]  /  Fax: [phone]   Initial Visit    Emma Rogers was seen in clinic today to evaluate for obesity. She is interested in losing weight to improve overall health and reduce the risk of weight related complications. She presents today to review program treatment options, initial physical assessment, and evaluation.      She was referred by: PCP  When asked what else they would like to accomplish? She states: Adopt healthier eating patterns, Improve energy levels and physical activity, Improve existing medical conditions, Reduce number of medications, Improve quality of life, and Current Weight 220 lbs, Goal Weight 160 lbs  When asked how has your weight affected you? She states: Contributed to medical problems, Contributed to orthopedic problems or mobility issues, Having fatigue, Having poor endurance, Problems with eating patterns, and Has affected mood   Weight history: Weight gain after pregnancy (two children ages 69 and 39). Weight gain since April 2024 and Oct 2024- 3 spinal revisions  Highest weight: 260 lbs approx 15 years ago  Some associated conditions: Hyperlipidemia, Prediabetes, and Vitamin D  Deficiency  Contributing factors: family history of obesity, disruption of circadian rhythm / sleep disordered breathing, consumption of processed foods, use of obesogenic medications: Psychotropic medications, moderate to high levels of stress, and menopause  Weight promoting medications identified: Other: Evening Gabapetin  Prior weight loss attempts: Weight Watchers  Current nutrition plan: None  Current level of physical activity: Other: Stationary Bike Ride 2 xweek  Current or previous pharmacotherapy: GLP-1  Response to medication: Was cost prohibitive or lost coverage for AOM.   Past medical history includes:   Past Medical History:  Diagnosis Date   Abnormal MRI 04/03/2019   Allergic rhinitis    Anxiety    Asthma    Depression     Headache(784.0)    History of iritis 11/27/2020   History of kidney stones    Neurocardiogenic syncope    pt was having episodes of syncope prior to pacemaker placement, no issues now   PONV (postoperative nausea and vomiting)    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    Shingles      Objective    BP 121/77   Pulse 66   Temp 98.3 F (36.8 C)   Ht 5\' 5"  (1.651 m)   SpO2 97%   BMI 37.11 kg/m  She was weighed on the bioimpedance scale: Body mass index is 37.11 kg/m.  Body Fat%:46.7, Visceral Fat Rating:13, Weight trend over the last 12 months: Increasing  General:  Alert, oriented and cooperative. Patient is in no acute distress.  Respiratory: Normal respiratory effort, no problems with respiration noted   Gait: able to ambulate independently  Mental Status: Normal mood and affect. Normal behavior. Normal judgment and thought content.   DIAGNOSTIC DATA REVIEWED:  BMET    Component Value Date/Time   NA 140 12/29/2023 1150   NA 135 (L) 06/15/2014 2324   K 4.4 12/29/2023 1150   K 3.3 (L) 06/15/2014 2324   CL 102 12/29/2023 1150   CL 98 06/15/2014 2324   CO2 23 12/29/2023 1150   CO2 31 06/15/2014 2324   GLUCOSE 104 (H) 12/29/2023 1150   GLUCOSE 142 (H) 11/28/2023 2025   GLUCOSE 122 (H) 06/15/2014 2324   BUN 22 12/29/2023 1150   BUN 15 06/15/2014 2324  CREATININE 1.27 (H) 12/29/2023 1150   CREATININE 0.95 03/30/2023 1103   CALCIUM  9.3 12/29/2023 1150   CALCIUM  9.2 06/15/2014 2324   GFRNONAA 53 (L) 11/28/2023 2025   GFRNONAA 49 (L) 11/01/2018 1156   GFRAA 76 01/12/2020 1607   GFRAA 57 (L) 11/01/2018 1156   Lab Results  Component Value Date   HGBA1C 6.2 (H) 12/29/2023   HGBA1C 5.8 10/16/2006   No results found for: "INSULIN " CBC    Component Value Date/Time   WBC 5.6 12/29/2023 1150   WBC 6.7 11/28/2023 2025    RBC 4.28 12/29/2023 1150   RBC 4.34 11/28/2023 2025   HGB 13.3 12/29/2023 1150   HCT 40.0 12/29/2023 1150   PLT 292 12/29/2023 1150   MCV 94 12/29/2023 1150   MCV 92 06/15/2014 2324   MCH 31.1 12/29/2023 1150   MCH 30.6 11/28/2023 2025   MCHC 33.3 12/29/2023 1150   MCHC 33.3 11/28/2023 2025   RDW 14.3 12/29/2023 1150   RDW 13.0 06/15/2014 2324   Iron/TIBC/Ferritin/ %Sat No results found for: "IRON", "TIBC", "FERRITIN", "IRONPCTSAT" Lipid Panel     Component Value Date/Time   CHOL 197 12/29/2023 1150   TRIG 104 12/29/2023 1150   HDL 61 12/29/2023 1150   CHOLHDL 3.0 01/12/2019 1327   CHOLHDL 4.3 11/01/2018 1156   VLDL 12.8 02/13/2012 1039   LDLCALC 117 (H) 12/29/2023 1150   LDLCALC 162 (H) 11/01/2018 1156   LDLDIRECT 178.3 02/13/2012 1039   Hepatic Function Panel     Component Value Date/Time   PROT 6.3 12/29/2023 1150   ALBUMIN  4.1 12/29/2023 1150   AST 17 12/29/2023 1150   ALT 16 12/29/2023 1150   ALKPHOS 79 12/29/2023 1150   BILITOT 0.5 12/29/2023 1150   BILIDIR 0.1 02/13/2012 1039      Component Value Date/Time   TSH 0.957 06/30/2023 1513     Assessment and Plan   Prediabetes  Vitamin B12 deficiency  Vitamin D  deficiency  MDD (major depressive disorder), recurrent episode, moderate (HCC)  Obesity (BMI 30-39.9), STARTING BMI 36.01    Assessment and Plan         ESTABLISH WITH [facility]   Obesity Treatment / Action Plan:  Patient will work on garnering support from family and friends to begin weight loss journey. Will work on eliminating or reducing the presence of highly palatable, calorie dense foods in the home. Will complete provided nutritional and psychosocial assessment questionnaire before the next appointment. Will be scheduled for indirect calorimetry to determine resting energy expenditure in a fasting state.  This will allow us  to create a reduced calorie, high-protein meal plan to promote loss of fat mass while preserving muscle  mass. Counseled on the health benefits of losing 5%-15% of total body weight. Was counseled on nutritional approaches to weight loss and benefits of reducing processed foods and consuming plant-based foods and high quality protein as part of nutritional weight management. Was counseled on pharmacotherapy and role as an adjunct in weight management.   Obesity Education Performed Today:  She was weighed on the bioimpedance scale and results were discussed and documented in the synopsis.  We discussed obesity as a disease and the importance of a more detailed evaluation of all the factors contributing to the disease.  We discussed the importance of long term lifestyle changes which include nutrition, exercise and behavioral modifications as well as the importance of customizing this to her specific health and social needs.  We discussed the benefits of reaching a healthier  weight to alleviate the symptoms of existing conditions and reduce the risks of the biomechanical, metabolic and psychological effects of obesity.  We reviewed the four pillars of obesity medicine and importance of using a multimodal approach.  We reviewed the basic principles in weight management.   Emma Rogers appears to be in the action stage of change and states they are ready to start intensive lifestyle modifications and behavioral modifications.  I have spent 34 minutes in the care of the patient today including: 5 minutes before the visit reviewing and preparing the chart. 25 minutes face-to-face assessing and reviewing listed medical problems as outlined in obesity care plan, providing nutritional and behavioral counseling on topics outlined in the obesity care plan, independently interpreting test results and goals of care, as described in assessment and plan, and reviewing and discussing biometric information and progress 4 minutes after the visit updating chart and documentation of encounter.  Reviewed by  clinician on day of visit: allergies, medications, problem list, medical history, surgical history, family history, social history, and previous encounter notes pertinent to obesity diagnosis.  Emma Rogers d. Danese Dorsainvil, NP-C

## 2024-02-21 ENCOUNTER — Ambulatory Visit
Admission: RE | Admit: 2024-02-21 | Discharge: 2024-02-21 | Disposition: A | Discharge status: Home / Self Care | Source: Ambulatory Visit | Attending: Emergency Medicine | Admitting: Emergency Medicine

## 2024-02-21 VITALS — BP 117/79 | HR 76 | Temp 97.7°F | Resp 18

## 2024-02-21 DIAGNOSIS — L0201 Cutaneous abscess of face: Secondary | ICD-10-CM | POA: Diagnosis not present

## 2024-02-21 MED ORDER — DOXYCYCLINE HYCLATE 100 MG PO CAPS: 100.0000 mg | ORAL_CAPSULE | Freq: Two times a day (BID) | ORAL | 0 refills | Status: AC

## 2024-02-21 NOTE — ED Triage Notes (Signed)
 Patient to Urgent Care with complaints of a wound/ knot on the left side of her jaw line. Some drainage. Red circle surrounds the area.  Symptoms started one week ago. Denies any fevers.

## 2024-02-21 NOTE — Discharge Instructions (Addendum)
Take the doxycycline as directed.  Follow up with your primary care provider tomorrow.  Go to the emergency department if you have worsening symptoms.

## 2024-02-21 NOTE — ED Provider Notes (Signed)
 Emma Rogers    CSN: [id number] Arrival date & time: 02/21/24  1232      History   Chief Complaint Chief Complaint  Patient presents with   Wound Check    I have a dime sized knot on my jaw line.  There is a red circle around it. - Entered by patient    HPI Emma Rogers is a 58 y.o. female.  Patient presents with painful red skin abscess on her left lower face at her jawline x 1 week.  The area has had small amount of purulent drainage.  No fever, difficulty swallowing, shortness of breath.  No OTC medication taken today.  The history is provided by the patient and medical records.    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    Shingles     Patient Active Problem List   Diagnosis Date Noted   Class 2 severe obesity due to excess calories with serious comorbidity and body mass index (BMI) of 35.0 to 35.9 in adult [facility]) 02/12/2024   Acute CHF (congestive heart failure) (HCC) 11/28/2023   Status post surgery 07/20/2023   Back pain 07/20/2023   Spinal stenosis of lumbar region 01/09/2023   Diarrhea, psychogenic 06/26/2022   Nephrolithiasis 02/20/2022   Psoriatic arthritis (HCC) 11/27/2020   Seronegative spondyloarthropathy 11/27/2020   Vertigo 06/16/2019   MDD (major depressive disorder), recurrent episode, moderate (HCC) 06/07/2019   GAD (generalized anxiety disorder) 05/26/2019   Memory deficit 04/26/2019   Chronic low back pain without sciatica 03/08/2019    Seizure-like activity (HCC) 03/08/2019   Moderate persistent asthma 01/27/2019   Cervical spinal stenosis 12/30/2018   Benign essential tremor 12/30/2018   Chronic venous insufficiency 03/10/2018   Lymphedema 03/10/2018   Familial hyperlipidemia 10/10/2017   Chronic kidney disease, stage III (moderate) (HCC) 09/03/2017   Vitamin D  deficiency 09/03/2017   Vitamin B12 deficiency 09/03/2017   Insomnia 08/11/2017   Menopausal symptoms 08/11/2017   Migraine without aura and without status migrainosus, not intractable 02/21/2017   Prediabetes 02/20/2017   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 Rodolfo Clan (medtronic)   LAMINECTOMY WITH POSTERIOR LATERAL ARTHRODESIS LEVEL 1 Left 07/20/2023   Procedure: Revision of spinal fusion left side with globus addition set;  Surgeon: Gearl Keens, MD;  Location: [facility] OR;  Service: Neurosurgery;  Laterality: Left;   Lobe hemithroidectomy     Right   LUMBAR FUSION  2004   w/ Dr. Lamon Pillow   LUMBAR LAMINECTOMY  1989   SPINAL FUSION  01/09/2023   L3 and L4, L5 and L6   TOTAL ABDOMINAL HYSTERECTOMY  2009   still has 1 ovary  OB History   No obstetric history on file.      Home Medications    Prior to Admission medications   Medication Sig Start Date End Date Taking? Authorizing Provider  doxycycline  (VIBRAMYCIN ) 100 MG capsule Take 1 capsule (100 mg total) by mouth 2 (two) times daily for 7 days. 02/21/24 02/28/24 Yes Wellington Half, NP  albuterol  (VENTOLIN  HFA) 108 (90 Base) MCG/ACT inhaler Inhale 2 puffs into the lungs every 6 (six) hours as needed for wheezing or shortness of breath. 12/29/23   Johnson, Megan P, DO  atorvastatin  (LIPITOR) 40 MG tablet Take 1 tablet (40 mg total) by mouth  at bedtime. 12/29/23   Johnson, Megan P, DO  buPROPion  (WELLBUTRIN  XL) 150 MG 24 hr tablet Take 1 tablet (150 mg total) by mouth daily. To be taken with Wellbutrin  300mg  to make 450mg . 12/29/23   Terre Ferri P, DO  buPROPion  (WELLBUTRIN  XL) 300 MG 24 hr tablet Take 1 tablet (300 mg total) by mouth daily. To be taken with Wellbutrin  300mg  to make 450mg . 12/29/23   Terre Ferri P, DO  furosemide  (LASIX ) 20 MG tablet TAKE 1 TABLET BY MOUTH EVERY DAY AS NEEDED 01/05/24   Terre Ferri P, DO  gabapentin  (NEURONTIN ) 400 MG capsule Take 1 capsule (400 mg total) by mouth at bedtime. 12/29/23   Terre Ferri P, DO  guselkumab  (TREMFYA ) 100 MG/ML pen Inject 100 mg into the skin every 8 weeks 04/28/23   Matt Song, MD  rizatriptan  (MAXALT ) 10 MG tablet Take 1 tablet (10 mg total) by mouth as needed for migraine. May repeat in 2 hours if needed 11/06/23   Terre Ferri P, DO  sertraline  (ZOLOFT ) 100 MG tablet Take 1.5 tablets (150 mg total) by mouth daily. 02/12/24   Johnson, Megan P, DO  traZODone  (DESYREL ) 100 MG tablet Take 0.5-2 tablets (50-200 mg total) by mouth at bedtime. 12/29/23   Solomon Dupre, DO    Family History Family History  Problem Relation Age of Onset   Rheum arthritis Mother    Asthma Mother    Heart disease Father        pacemaker,arrythmias   Lung disease Father    COPD Maternal Grandmother    Breast cancer Maternal Grandmother    Heart disease Maternal Grandfather    Liver disease Paternal Grandmother    Heart disease Paternal Grandfather    Asthma Other    Coronary artery disease Other        Female 1st degree relative <50   Lung cancer Other    Prostate cancer Other        1st degree relative <50    Social History Social History   Tobacco Use   Smoking status: Never    Passive exposure: Past   Smokeless tobacco: Never  Vaping Use   Vaping status: Never Used  Substance Use Topics   Alcohol use: No    Alcohol/week: 0.0 standard drinks of alcohol   Drug use:  No     Allergies   Augmentin [amoxicillin-pot clavulanate] and Morphine and codeine    Review of Systems Review of Systems  Constitutional:  Negative for chills and fever.  HENT:  Negative for sore throat, trouble swallowing and voice change.   Respiratory:  Negative for cough and shortness of breath.   Skin:  Positive for color change and wound.     Physical Exam Triage Vital Signs ED Triage Vitals  Encounter Vitals Group     BP 02/21/24 1248  117/79     Systolic BP Percentile --      Diastolic BP Percentile --      Pulse Rate 02/21/24 1248 76     Resp 02/21/24 1248 18     Temp 02/21/24 1248 97.7 F (36.5 C)     Temp src --      SpO2 02/21/24 1248 96 %     Weight --      Height --      Head Circumference --      Peak Flow --      Pain Score 02/21/24 1251 5     Pain Loc --      Pain Education --      Exclude from Growth Chart --    No data found.  Updated Vital Signs BP 117/79   Pulse 76   Temp 97.7 F (36.5 C)   Resp 18   SpO2 96%   Visual Acuity Right Eye Distance:   Left Eye Distance:   Bilateral Distance:    Right Eye Near:   Left Eye Near:    Bilateral Near:     Physical Exam Constitutional:      General: She is not in acute distress. HENT:     Mouth/Throat:     Mouth: Mucous membranes are moist.     Pharynx: Oropharynx is clear.  Cardiovascular:     Rate and Rhythm: Normal rate and regular rhythm.  Pulmonary:     Effort: Pulmonary effort is normal. No respiratory distress.  Musculoskeletal:     Cervical back: Neck supple.  Lymphadenopathy:     Cervical: No cervical adenopathy.  Skin:    General: Skin is warm and dry.     Findings: Erythema and lesion present.     Comments: Tender erythematous firm nonfluctuant 2 cm area of induration with central scabbed lesion on left lower jawline.  No drainage.  No lymphadenopathy.    Neurological:     Mental Status: She is alert.      UC Treatments / Results  Labs (all labs ordered are  listed, but only abnormal results are displayed) Labs Reviewed - No data to display  EKG   Radiology No results found.  Procedures Procedures (including critical care time)  Medications Ordered in UC Medications - No data to display  Initial Impression / Assessment and Plan / UC Course  I have reviewed the triage vital signs and the nursing notes.  Pertinent labs & imaging results that were available during my care of the patient were reviewed by me and considered in my medical decision making (see chart for details).    Skin abscess of face.  Afebrile and vital signs are stable.  No indication for I&D at this time.  Treating with doxycycline  and warm compresses.  Instructed patient to follow-up with her PCP tomorrow.  ED precautions given.  Education provided on abscess.  She agrees to plan of care.  Final Clinical Impressions(s) / UC Diagnoses   Final diagnoses:  Cutaneous abscess of face     Discharge Instructions      Take the doxycycline  as directed.  Follow up with your primary care provider tomorrow.  Go to the emergency department if you have worsening symptoms.      ED Prescriptions     Medication Sig Dispense Auth. Provider   doxycycline  (VIBRAMYCIN ) 100 MG capsule Take 1 capsule (100 mg total) by mouth 2 (two) times daily for 7 days. 14 capsule Wellington Half, NP  PDMP not reviewed this encounter.   Wellington Half, NP 02/21/24 [phone]

## 2024-02-22 DIAGNOSIS — M5416 Radiculopathy, lumbar region: Secondary | ICD-10-CM | POA: Diagnosis not present

## 2024-02-23 ENCOUNTER — Ambulatory Visit: Payer: Self-pay | Admitting: Pulmonary Disease

## 2024-02-24 ENCOUNTER — Ambulatory Visit: Admitting: Nurse Practitioner

## 2024-02-24 ENCOUNTER — Encounter: Payer: Self-pay | Admitting: Nurse Practitioner

## 2024-02-24 VITALS — BP 109/73 | HR 64 | Temp 98.9°F | Resp 17 | Ht 65.0 in | Wt 221.2 lb

## 2024-02-24 DIAGNOSIS — B029 Zoster without complications: Secondary | ICD-10-CM

## 2024-02-24 MED ORDER — GABAPENTIN 600 MG PO TABS: 600.0000 mg | ORAL_TABLET | Freq: Three times a day (TID) | ORAL | 1 refills | Status: DC

## 2024-02-24 MED ORDER — VALACYCLOVIR HCL 1 G PO TABS: 1000.0000 mg | ORAL_TABLET | Freq: Three times a day (TID) | ORAL | 0 refills | Status: AC

## 2024-02-24 NOTE — Progress Notes (Signed)
 BP 109/73 (BP Location: Right Arm, Patient Position: Sitting, Cuff Size: Large)   Pulse 64   Temp 98.9 F (37.2 C) (Oral)   Resp 17   Ht 5\' 5"  (1.651 m)   Wt 221 lb 3.2 oz (100.3 kg)   SpO2 98%   BMI 36.81 kg/m    Subjective:    Patient ID: Emma Rogers, female    DOB: 1966/01/21, 58 y.o.   MRN: [id number]  HPI: Emma Rogers is a 58 y.o. female  Chief Complaint  Patient presents with   Skin Problem    Started as much smaller area and has spread to include scabbed over area and small blisters. Was started on antibiotics (Doxycycline ) over the weekend by UC however she feels it has gotten worse and swelling might also be worse. Not painful but does have a burning and pulling sensation.     Emma Rogers is a 58 y.o. female.  Patient presents with painful red skin abscess on her left lower face at her jawline x 1 week.  The area has had small amount of purulent drainage.  No fever, difficulty swallowing, shortness of breath.  No OTC medication taken today.   She was seen in UC on 02/21/24.  She doesn't remember having any bites in the area.  She is taking Doxycyline but it doesn't seem to be helping.    Relevant past medical, surgical, family and social history reviewed and updated as indicated. Interim medical history since our last visit reviewed. Allergies and medications reviewed and updated.  Review of Systems  Skin:        Abscess on jaw    Per HPI unless specifically indicated above     Objective:     BP 109/73 (BP Location: Right Arm, Patient Position: Sitting, Cuff Size: Large)   Pulse 64   Temp 98.9 F (37.2 C) (Oral)   Resp 17   Ht 5\' 5"  (1.651 m)   Wt 221 lb 3.2 oz (100.3 kg)   SpO2 98%   BMI 36.81 kg/m   Wt Readings from Last 3 Encounters:  02/24/24 221 lb 3.2 oz (100.3 kg)  02/16/24 220 lb (99.8 kg)  02/16/24 223 lb (101.2 kg)    Physical Exam Vitals and nursing note reviewed.  Constitutional:      General: She is not in acute distress.     Appearance: Normal appearance. She is normal weight. She is not ill-appearing, toxic-appearing or diaphoretic.  HENT:     Head: Normocephalic.     Right Ear: External ear normal.     Left Ear: External ear normal.     Nose: Nose normal.     Mouth/Throat:     Mouth: Mucous membranes are moist.     Pharynx: Oropharynx is clear.  Eyes:     General:        Right eye: No discharge.        Left eye: No discharge.     Extraocular Movements: Extraocular movements intact.     Conjunctiva/sclera: Conjunctivae normal.     Pupils: Pupils are equal, round, and reactive to light.  Cardiovascular:     Rate and Rhythm: Normal rate and regular rhythm.     Heart sounds: No murmur heard. Pulmonary:     Effort: Pulmonary effort is normal. No respiratory distress.     Breath sounds: Normal breath sounds. No wheezing or rales.  Musculoskeletal:     Cervical back: Normal range of motion and neck supple.  Skin:  General: Skin is warm and dry.     Capillary Refill: Capillary refill takes less than 2 seconds.       Neurological:     General: No focal deficit present.     Mental Status: She is alert and oriented to person, place, and time. Mental status is at baseline.  Psychiatric:        Mood and Affect: Mood normal.        Behavior: Behavior normal.        Thought Content: Thought content normal.        Judgment: Judgment normal.     Results for orders placed or performed in visit on 02/16/24  Pulmonary Function Test   Collection Time: 02/16/24 10:51 AM  Result Value Ref Range   FVC-Pre 2.86 L   FVC-%Pred-Pre 78 %   FVC-Post 3.10 L   FVC-%Pred-Post 84 %   FVC-%Change-Post 8 %   FEV1-Pre 2.41 L   FEV1-%Pred-Pre 84 %   FEV1-Post 2.55 L   FEV1-%Pred-Post 89 %   FEV1-%Change-Post 5 %   FEV6-Pre 2.84 L   FEV6-%Pred-Pre 80 %   FEV6-Post 3.10 L   FEV6-%Pred-Post 87 %   FEV6-%Change-Post 9 %   Pre FEV1/FVC ratio 84 %   FEV1FVC-%Pred-Pre 106 %   Post FEV1/FVC ratio 82 %    FEV1FVC-%Change-Post -2 %   Pre FEV6/FVC Ratio 100 %   FEV6FVC-%Pred-Pre 103 %   Post FEV6/FVC ratio 100 %   FEV6FVC-%Pred-Post 103 %   FEF 25-75 Pre 2.48 L/sec   FEF2575-%Pred-Pre 94 %   FEF 25-75 Post 3.11 L/sec   FEF2575-%Pred-Post 118 %   FEF2575-%Change-Post 25 %   RV 5.80 L   RV % pred 288 %   TLC 8.82 L   TLC % pred 164 %   DLCO unc 20.59 ml/min/mmHg   DLCO unc % pred 94 %   DL/VA 1.61 ml/min/mmHg/L   DL/VA % pred 096 %      Assessment & Plan:   Problem List Items Addressed This Visit   None Visit Diagnoses       Herpes zoster without complication    -  Primary   Will treat with Valacylovir. Complete course of medication.  Discussed signs and symptoms to monitor for.  Increased dose of Gabapentin  to 600mg  TID for pain.   Relevant Medications   valACYclovir  (VALTREX ) 1000 MG tablet        Follow up plan: Return in about 1 week (around 03/02/2024) for Shingles check.

## 2024-02-29 ENCOUNTER — Other Ambulatory Visit: Payer: Self-pay | Admitting: Family Medicine

## 2024-02-29 ENCOUNTER — Telehealth: Payer: Self-pay | Admitting: Cardiology

## 2024-02-29 NOTE — Telephone Encounter (Signed)
 Called to confirm/remind patient of their appointment at [facility] on 03/01/24.   Appointment:   [] Confirmed  [x] Left mess   [] No answer/No voice mail  [] VM Full/unable to leave message  [] Phone not in service  Patient reminded to bring all medications and/or complete list.  Confirmed patient has transportation. Gave directions, instructed to utilize valet parking.

## 2024-03-01 ENCOUNTER — Ambulatory Visit
Admission: RE | Admit: 2024-03-01 | Discharge: 2024-03-01 | Disposition: A | Discharge status: Home / Self Care | Source: Ambulatory Visit | Attending: Cardiology | Admitting: Cardiology

## 2024-03-01 ENCOUNTER — Ambulatory Visit (HOSPITAL_BASED_OUTPATIENT_CLINIC_OR_DEPARTMENT_OTHER): Admitting: Cardiology

## 2024-03-01 ENCOUNTER — Ambulatory Visit (HOSPITAL_COMMUNITY): Payer: Self-pay | Admitting: Cardiology

## 2024-03-01 VITALS — BP 132/73 | HR 67 | Wt 221.0 lb

## 2024-03-01 DIAGNOSIS — N183 Chronic kidney disease, stage 3 unspecified: Secondary | ICD-10-CM

## 2024-03-01 DIAGNOSIS — Z950 Presence of cardiac pacemaker: Secondary | ICD-10-CM | POA: Insufficient documentation

## 2024-03-01 DIAGNOSIS — F419 Anxiety disorder, unspecified: Secondary | ICD-10-CM | POA: Insufficient documentation

## 2024-03-01 DIAGNOSIS — R0609 Other forms of dyspnea: Secondary | ICD-10-CM | POA: Diagnosis not present

## 2024-03-01 DIAGNOSIS — I081 Rheumatic disorders of both mitral and tricuspid valves: Secondary | ICD-10-CM | POA: Insufficient documentation

## 2024-03-01 LAB — ECHOCARDIOGRAM COMPLETE
AR max vel: 2.72 cm2
AV Area VTI: 2.9 cm2
AV Area mean vel: 2.86 cm2
AV Mean grad: 3 mmHg
AV Peak grad: 5.2 mmHg
Ao pk vel: 1.14 m/s
Area-P 1/2: 3.12 cm2
MV VTI: 2.81 cm2
S' Lateral: 2.9 cm

## 2024-03-01 NOTE — Progress Notes (Signed)
*  PRELIMINARY RESULTS* Echocardiogram 2D Echocardiogram has been performed.  Emma Rogers 03/01/2024, 10:56 AM

## 2024-03-01 NOTE — Patient Instructions (Signed)
  Please call our office if you need anything in the future!

## 2024-03-01 NOTE — Telephone Encounter (Signed)
 Requested Prescriptions  Pending Prescriptions Disp Refills   traZODone  (DESYREL ) 100 MG tablet [Pharmacy Med Name: TRAZODONE  100 MG TABLET] 180 tablet 1    Sig: TAKE 2 TABLETS BY MOUTH AT BEDTIME     Psychiatry: Antidepressants - Serotonin Modulator Passed - 03/01/2024  2:22 PM      Passed - Completed PHQ-2 or PHQ-9 in the last 360 days      Passed - Valid encounter within last 6 months    Recent Outpatient Visits           6 days ago Herpes zoster without complication   [facility] Aileen Alexanders, NP   2 weeks ago MDD (major depressive disorder), recurrent episode, moderate (HCC)   [place] [facility] [place], Megan P, DO   2 months ago MDD (major depressive disorder), recurrent episode, moderate (HCC)   [place] [facility] [place], Megan P, DO   3 months ago MDD (major depressive disorder), recurrent episode, moderate (HCC)   [place] [facility] [place], Megan P, DO   1 year ago Encounter for medication management   [facility] [place] Valente Gaskin, RPH-CPP       Future Appointments             In 1 month Ladd Picker, MD [facility]

## 2024-03-04 ENCOUNTER — Ambulatory Visit: Payer: Self-pay | Admitting: *Deleted

## 2024-03-04 NOTE — Telephone Encounter (Signed)
 FYI Only or Action Required?: FYI only for provider  Patient was last seen in primary care on 02/24/2024 by Aileen Alexanders, NP. Called Nurse Triage reporting shingles pain. Symptoms began several weeks ago. Interventions attempted: Prescription medications: gabapentin . Symptoms are: stable.  Triage Disposition: Home Care  Patient/caregiver understands and will follow disposition?: yes   Reason for Disposition  Postherpetic neuralgia, questions about  Answer Assessment - Initial Assessment Questions 1. APPEARANCE of RASH: Describe the rash.      Blisters have healed- still pink I areas 2. LOCATION: Where is the rash located?      Left side of face- jaw/chin area 3. ONSET: When did the rash start?      2 weeks ago 4. ITCHING: Does the rash itch? If Yes, ask: How bad is the itch?  (Scale 1-10; or mild, moderate, severe)     Yes- rash location- gone now 5. PAIN: Does the rash hurt? If Yes, ask: How bad is the pain?  (Scale 0-10; or none, mild, moderate, severe)    - NONE (0): no pain    - MILD (1-3): doesn't interfere with normal activities     - MODERATE (4-7): interferes with normal activities or awakens from sleep     - SEVERE (8-10): excruciating pain, unable to do any normal activities     Pain in ear, frequent headaches, neck pain- hairline into the neck- can be sharp. 6. OTHER SYMPTOMS: Do you have any other symptoms? (e.g., fever)     No  Patient has finished the treatment- but still residual pain. She is not sure this pain is related to nerve pathway- because it is located at hairline on back of neck. Patient is presently using gabapentin  for pain- and it does sem to help- she states she can tell when it wears off.  Protocols used: Shingles (Zoster)-A-AH   Copied from CRM [id number]. Topic: Clinical - Red Word Triage >> Mar 04, 2024  3:13 PM Georgeann Kindred wrote: Red Word that prompted transfer to Nurse Triage: Patient is experiencing neck pain after having shingles.  Shingles were on her chin/jaw and the pain in the neck is on the same side the shingles were on. She denies that it is severe but would like to speak with NT.

## 2024-03-05 NOTE — Progress Notes (Unsigned)
   ADVANCED HEART FAILURE NEW PATIENT CLINIC NOTE  Referring Physician: Solomon Dupre, DO  Primary Care: Solomon Dupre, DO Primary Cardiologist:  HPI: Emma Rogers is a 58 y.o. female with a PMH of HTN, HLD, recent spinal surgery, CKD stage III who presents for initial visit for further evaluation and treatment of heart failure/cardiomyopathy.      She has a dual chamber pacemaker in from 1996, unable to determine the etiology from chart review. No history of heart failure, last echo normal in 2019. Seen in the ED for hemoptysis, but likely related to recent viral infection, has improved.       SUBJECTIVE: Patient with no active issues. We reviewed her CT scan including trace coronary calcifications seen in the LAD. She denies any significant exertional dyspnea. She overall is doing fairly well. Reviewed recent normal echo, noted to have mild Grade I DD.   PMH, current medications, allergies, social history, and family history reviewed in epic.  PHYSICAL EXAM: Vitals:   03/01/24 1056  BP: 132/73  Pulse: 67  SpO2: 97%   GENERAL: NAD, well appearing PULM:  Normal work of breathing, CTAB CARDIAC:  JVP: flat         Normal rate with regular rhythm. No murmurs, rubs or gallops.  No edema. Warm and well perfused extremities. ABDOMEN: Soft, non-tender, non-distended. NEUROLOGIC: Patient is oriented x3 with no focal or lateralizing neurologic deficits.     DATA REVIEW    ECHO: 2019: LVEF 60-65%, normal diastology 2025: LVEF 60-65%, grade I DD, normal RV  CATH: None    ASSESSMENT & PLAN:  Shortness of breath and pleural effusion: Referred from the ED, workup reassuring with normal BNP. Echocardiogram is normal today, no evidence of LV dysfunction, valvular abnormality. Does have mild Grade 1 DD, may benefit from SGLT-2 in the future. - No need for advanced heart failure clinic followup - Continue lasix  prn - Defer GDMT to PCP - Instructed to reach out with any  issues.   CAC: Very mild, already on statin therapy.  - Reassurance, continue current medical therapy   Follow up as needed  Arta Lark, MD Advanced Heart Failure Mechanical Circulatory Support 03/05/24

## 2024-03-07 ENCOUNTER — Encounter: Payer: Self-pay | Admitting: Nurse Practitioner

## 2024-03-07 ENCOUNTER — Ambulatory Visit: Admitting: Nurse Practitioner

## 2024-03-07 VITALS — BP 135/86 | HR 60 | Temp 98.5°F | Wt 223.6 lb

## 2024-03-07 DIAGNOSIS — B029 Zoster without complications: Secondary | ICD-10-CM

## 2024-03-07 MED ORDER — TRAMADOL HCL 50 MG PO TABS: 50.0000 mg | ORAL_TABLET | Freq: Three times a day (TID) | ORAL | 0 refills | Status: AC | PRN

## 2024-03-07 MED ORDER — CYCLOBENZAPRINE HCL 10 MG PO TABS
10.0000 mg | ORAL_TABLET | Freq: Every day | ORAL | 0 refills | Status: DC
Start: 2024-03-07 — End: 2024-03-22

## 2024-03-07 MED ORDER — ONDANSETRON HCL 4 MG PO TABS: 4.0000 mg | ORAL_TABLET | Freq: Three times a day (TID) | ORAL | 0 refills | Status: DC | PRN

## 2024-03-07 NOTE — Progress Notes (Signed)
 BP 135/86   Pulse 60   Temp 98.5 F (36.9 C) (Oral)   Wt 223 lb 9.6 oz (101.4 kg)   SpO2 98%   BMI 37.21 kg/m    Subjective:    Patient ID: Emma Rogers, female    DOB: September 10, 1966, 58 y.o.   MRN: [id number]  HPI: Emma Rogers is a 58 y.o. female  Chief Complaint  Patient presents with   shingles follow up    Still having a lot of pain going down her neck sometimes causing her to fell nauseous    Patient states she had been having a major headache and earache.  States it started running down her neck and felt like someone was pulling her hair.  If she touched it is very tender to touch.  At times she is having nausea.  She is not able to take the gabapentin  throughout the day due to increase in drowsiness.     Relevant past medical, surgical, family and social history reviewed and updated as indicated. Interim medical history since our last visit reviewed. Allergies and medications reviewed and updated.  Review of Systems  Gastrointestinal:  Positive for nausea.  Musculoskeletal:  Positive for neck pain.  Skin:        Healing shingles rash on chin    Per HPI unless specifically indicated above     Objective:    BP 135/86   Pulse 60   Temp 98.5 F (36.9 C) (Oral)   Wt 223 lb 9.6 oz (101.4 kg)   SpO2 98%   BMI 37.21 kg/m   Wt Readings from Last 3 Encounters:  03/07/24 223 lb 9.6 oz (101.4 kg)  03/01/24 221 lb (100.2 kg)  02/24/24 221 lb 3.2 oz (100.3 kg)    Physical Exam Vitals and nursing note reviewed.  Constitutional:      General: She is not in acute distress.    Appearance: Normal appearance. She is normal weight. She is not ill-appearing, toxic-appearing or diaphoretic.  HENT:     Head: Normocephalic.     Right Ear: External ear normal.     Left Ear: External ear normal.     Nose: Nose normal.     Mouth/Throat:     Mouth: Mucous membranes are moist.     Pharynx: Oropharynx is clear.   Eyes:     General:        Right eye: No discharge.         Left eye: No discharge.     Extraocular Movements: Extraocular movements intact.     Conjunctiva/sclera: Conjunctivae normal.     Pupils: Pupils are equal, round, and reactive to light.    Cardiovascular:     Rate and Rhythm: Normal rate and regular rhythm.     Heart sounds: No murmur heard. Pulmonary:     Effort: Pulmonary effort is normal. No respiratory distress.     Breath sounds: Normal breath sounds. No wheezing or rales.   Musculoskeletal:     Cervical back: Normal range of motion and neck supple.   Skin:    General: Skin is warm and dry.     Capillary Refill: Capillary refill takes less than 2 seconds.       Neurological:     General: No focal deficit present.     Mental Status: She is alert and oriented to person, place, and time. Mental status is at baseline.   Psychiatric:        Mood and Affect: Mood  normal.        Behavior: Behavior normal.        Thought Content: Thought content normal.        Judgment: Judgment normal.     Results for orders placed or performed during the hospital encounter of 03/01/24  ECHOCARDIOGRAM COMPLETE   Collection Time: 03/01/24 10:56 AM  Result Value Ref Range   Ao pk vel 1.14 m/s   AV Area VTI 2.90 cm2   AR max vel 2.72 cm2   AV Mean grad 3.0 mmHg   AV Peak grad 5.2 mmHg   S' Lateral 2.90 cm   AV Area mean vel 2.86 cm2   Area-P 1/2 3.12 cm2   MV VTI 2.81 cm2   Est EF 55 - 60%       Assessment & Plan:   Problem List Items Addressed This Visit   None Visit Diagnoses       Herpes zoster without complication    -  Primary   Conitnue with Gabapentin  at bedtime. Will give Flexeril  for neck stiffness, zofran  for nauea. And Tramadol  sent to help with daytime pain. Follow up in 1 week        Follow up plan: Return in about 1 week (around 03/14/2024) for Shingles Pain.

## 2024-03-08 ENCOUNTER — Encounter: Payer: Self-pay | Admitting: Family Medicine

## 2024-03-14 ENCOUNTER — Ambulatory Visit: Admitting: Family Medicine

## 2024-03-21 ENCOUNTER — Ambulatory Visit (INDEPENDENT_AMBULATORY_CARE_PROVIDER_SITE_OTHER): Payer: Commercial Managed Care - PPO

## 2024-03-21 DIAGNOSIS — I509 Heart failure, unspecified: Secondary | ICD-10-CM

## 2024-03-22 ENCOUNTER — Ambulatory Visit: Admitting: Family Medicine

## 2024-03-22 ENCOUNTER — Encounter: Payer: Self-pay | Admitting: Family Medicine

## 2024-03-22 ENCOUNTER — Telehealth (INDEPENDENT_AMBULATORY_CARE_PROVIDER_SITE_OTHER): Admitting: Family Medicine

## 2024-03-22 ENCOUNTER — Encounter (INDEPENDENT_AMBULATORY_CARE_PROVIDER_SITE_OTHER): Payer: Self-pay

## 2024-03-22 VITALS — Ht 66.0 in | Wt 223.0 lb

## 2024-03-22 DIAGNOSIS — F331 Major depressive disorder, recurrent, moderate: Secondary | ICD-10-CM | POA: Diagnosis not present

## 2024-03-22 DIAGNOSIS — B029 Zoster without complications: Secondary | ICD-10-CM | POA: Diagnosis not present

## 2024-03-22 DIAGNOSIS — R519 Headache, unspecified: Secondary | ICD-10-CM

## 2024-03-22 NOTE — Progress Notes (Signed)
 Ht 5' 6 (1.676 m)   Wt 223 lb (101.2 kg)   BMI 35.99 kg/m    Subjective:    Patient ID: Emma Rogers, female    DOB: 1966-01-23, 58 y.o.   MRN: [id number]  HPI: Emma Rogers is a 58 y.o. female  Chief Complaint  Patient presents with   Depression   Headache   Insomnia   Shingles pain is easing up. Feeling more like herself. Has been having more trouble sleeping and headaches since going up on the sertraline .   DEPRESSION Mood status: better Satisfied with current treatment?: unsure Symptom severity: moderate  Duration of current treatment : about 6 weeks Side effects: maybe- having headaches and trouble sleeping Medication compliance: excellent compliance Psychotherapy/counseling: yes  Previous psychiatric medications: wellbutrin , sertraline  Depressed mood: yes Anxious mood: yes Anhedonia: no Significant weight loss or gain: no Insomnia: yes hard to fall asleep Fatigue: yes Feelings of worthlessness or guilt: no Impaired concentration/indecisiveness: yes Suicidal ideations: no Hopelessness: no Crying spells: no    03/22/2024    3:56 PM 03/07/2024   11:20 AM 02/24/2024    3:05 PM 02/12/2024    1:58 PM 11/06/2023    9:54 AM  Depression screen PHQ 2/9  Decreased Interest 0 1 1 0 0  Down, Depressed, Hopeless 0 1 2 2  0  PHQ - 2 Score 0 2 3 2  0  Altered sleeping 3 1 3 3 2   Tired, decreased energy 3 1 1 3 3   Change in appetite 0 0 0 0 0  Feeling bad or failure about yourself  0 0 1 1 0  Trouble concentrating 1 1 0 0 0  Moving slowly or fidgety/restless 0 0 0 0 0  Suicidal thoughts 0 0 0 0 0  PHQ-9 Score 7 5 8 9 5   Difficult doing work/chores Somewhat difficult Somewhat difficult Somewhat difficult Somewhat difficult Somewhat difficult    Relevant past medical, surgical, family and social history reviewed and updated as indicated. Interim medical history since our last visit reviewed. Allergies and medications reviewed and updated.  Review of Systems   Constitutional: Negative.   Respiratory: Negative.    Cardiovascular: Negative.   Musculoskeletal: Negative.   Neurological:  Positive for headaches. Negative for dizziness, tremors, seizures, syncope, facial asymmetry, speech difficulty, weakness, light-headedness and numbness.  Psychiatric/Behavioral: Negative.      Per HPI unless specifically indicated above     Objective:    Ht 5' 6 (1.676 m)   Wt 223 lb (101.2 kg)   BMI 35.99 kg/m   Wt Readings from Last 3 Encounters:  03/22/24 223 lb (101.2 kg)  03/07/24 223 lb 9.6 oz (101.4 kg)  03/01/24 221 lb (100.2 kg)    Physical Exam Vitals and nursing note reviewed.  Constitutional:      General: She is not in acute distress.    Appearance: Normal appearance. She is well-developed. She is not ill-appearing, toxic-appearing or diaphoretic.  HENT:     Head: Normocephalic and atraumatic.     Right Ear: External ear normal.     Left Ear: External ear normal.     Nose: Nose normal.     Mouth/Throat:     Mouth: Mucous membranes are moist.     Pharynx: Oropharynx is clear.   Eyes:     General: No scleral icterus.       Right eye: No discharge.        Left eye: No discharge.     Conjunctiva/sclera: Conjunctivae normal.  Pupils: Pupils are equal, round, and reactive to light.   Pulmonary:     Effort: Pulmonary effort is normal. No respiratory distress.     Comments: Speaking in full sentences  Musculoskeletal:        General: Normal range of motion.     Cervical back: Normal range of motion.   Skin:    Coloration: Skin is not jaundiced or pale.     Findings: No bruising, erythema, lesion or rash.   Neurological:     Mental Status: She is alert and oriented to person, place, and time. Mental status is at baseline.   Psychiatric:        Mood and Affect: Mood normal.        Behavior: Behavior normal.        Thought Content: Thought content normal.        Judgment: Judgment normal.     Results for orders placed  or performed during the hospital encounter of 03/01/24  ECHOCARDIOGRAM COMPLETE   Collection Time: 03/01/24 10:56 AM  Result Value Ref Range   Ao pk vel 1.14 m/s   AV Area VTI 2.90 cm2   AR max vel 2.72 cm2   AV Mean grad 3.0 mmHg   AV Peak grad 5.2 mmHg   S' Lateral 2.90 cm   AV Area mean vel 2.86 cm2   Area-P 1/2 3.12 cm2   MV VTI 2.81 cm2   Est EF 55 - 60%       Assessment & Plan:   Problem List Items Addressed This Visit       Other   MDD (major depressive disorder), recurrent episode, moderate (HCC) - Primary   Doing better but having headaches since she went up on the sertraline . Will cut back down to 100mg  and recheck in 2 weeks. If headaches better and mood worse may need different treatment. Call with any concerns.       Other Visit Diagnoses       Nonintractable episodic headache, unspecified headache type       Will cut her sertraline  to 100mg  for 2 weeks to see if her headaches resolve. If they do- remain on lower dose, if not will look for other causes.     Herpes zoster without complication       Pain is resolved. Feeling more like herself. Call with any concerns.        Follow up plan: Return in about 3 months (around 06/30/2024) for physical.    This visit was completed via video visit through MyChart due to the restrictions of the COVID-19 pandemic. All issues as above were discussed and addressed. Physical exam was done as above through visual confirmation on video through MyChart. If it was felt that the patient should be evaluated in the office, they were directed there. The patient verbally consented to this visit. Location of the patient: home Location of the provider: work Those involved with this call:  Provider: Duwaine Louder, DO CMA: York Fogo, CMA, Front Desk/Registration: Claretta Maiden  Time spent on call: 25 minutes with patient face to face via video conference. More than 50% of this time was spent in counseling and coordination of  care. 40 minutes total spent in review of patient's record and preparation of their chart.

## 2024-03-22 NOTE — Assessment & Plan Note (Signed)
 Doing better but having headaches since she went up on the sertraline . Will cut back down to 100mg  and recheck in 2 weeks. If headaches better and mood worse may need different treatment. Call with any concerns.

## 2024-03-24 LAB — CUP PACEART REMOTE DEVICE CHECK
Battery Impedance: 2412 Ohm
Battery Remaining Longevity: 24 mo
Battery Voltage: 2.74 V
Brady Statistic AP VP Percent: 0 %
Brady Statistic AP VS Percent: 33 %
Brady Statistic AS VP Percent: 0 %
Brady Statistic AS VS Percent: 67 %
Date Time Interrogation Session: 20250702153540
Implantable Lead Connection Status: 753985
Implantable Lead Connection Status: 753985
Implantable Lead Implant Date: 19960806
Implantable Lead Implant Date: 19960806
Implantable Lead Location: 753859
Implantable Lead Location: 753860
Implantable Lead Model: 4524
Implantable Lead Model: 5034
Implantable Pulse Generator Implant Date: 20110328
Lead Channel Impedance Value: 1076 Ohm
Lead Channel Impedance Value: 288 Ohm
Lead Channel Pacing Threshold Amplitude: 1.625 V
Lead Channel Pacing Threshold Amplitude: 1.75 V
Lead Channel Pacing Threshold Pulse Width: 0.4 ms
Lead Channel Pacing Threshold Pulse Width: 0.4 ms
Lead Channel Setting Pacing Amplitude: 3.25 V
Lead Channel Setting Pacing Amplitude: 3.5 V
Lead Channel Setting Pacing Pulse Width: 0.4 ms
Lead Channel Setting Sensing Sensitivity: 2 mV
Zone Setting Status: 755011
Zone Setting Status: 755011

## 2024-03-28 ENCOUNTER — Ambulatory Visit: Payer: Self-pay | Admitting: Cardiology

## 2024-03-29 ENCOUNTER — Ambulatory Visit: Admitting: Family Medicine

## 2024-03-30 NOTE — Progress Notes (Signed)
 Called patient left message for patient to call and schedule appt.

## 2024-04-01 NOTE — Progress Notes (Signed)
 Left message on voice mail for patient to call and schedule a 3  month appt for a physical

## 2024-04-05 ENCOUNTER — Ambulatory Visit: Admitting: Pulmonary Disease

## 2024-04-05 ENCOUNTER — Encounter: Payer: Self-pay | Admitting: Pulmonary Disease

## 2024-04-05 VITALS — BP 130/70 | HR 68 | Temp 98.4°F | Ht 66.0 in | Wt 225.0 lb

## 2024-04-05 DIAGNOSIS — J4530 Mild persistent asthma, uncomplicated: Secondary | ICD-10-CM | POA: Diagnosis not present

## 2024-04-05 LAB — NITRIC OXIDE: Nitric Oxide: <5

## 2024-04-05 MED ORDER — BUDESONIDE-FORMOTEROL FUMARATE 160-4.5 MCG/ACT IN AERO: 2.0000 | INHALATION_SPRAY | Freq: Two times a day (BID) | RESPIRATORY_TRACT | 12 refills | Status: AC

## 2024-04-05 MED ORDER — BUDESONIDE-FORMOTEROL FUMARATE 160-4.5 MCG/ACT IN AERO: 2.0000 | INHALATION_SPRAY | Freq: Two times a day (BID) | RESPIRATORY_TRACT | 12 refills | Status: DC

## 2024-04-05 NOTE — Progress Notes (Signed)
 Synopsis: Referred in by Vicci Duwaine SQUIBB, DO   Subjective:   PATIENT ID: Emma Rogers GENDER: female DOB: 02-12-1966, MRN: [id number]  Chief Complaint  Patient presents with   Follow-up    Review CT.    HPI [name] is a pleasant 58 year old female patient with a past medical history of psoriatic arthritis on Tremfya  every 8 weeks, asthma as a child presenting today to the pulmonary clinic to establish care.  She reports that she had a URI early May and coughed up some blood-tinged sputum for which she went to the emergency department.  She had a CTA chest done that ruled out PE but did show mild ill-defined patchy area of groundglass opacity bilaterally but mostly on the left lower lobe consistent with an inflammatory process.  She has recovered did not have any hemoptysis since.  She is pending an echocardiogram per cardiology on June 10.  Her asthma she was diagnosed as a child not requiring any maintenance inhaler for years.  She does have hypersensitivity to cold air and strong scents and high humidity.  She does have allergic rhinitis.  Family history mother with asthma and grandmother with asthma as well.  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  Currently is on disability due to back surgeries.  OV 07.15.2025 - Ms. Markin is here to follow up on her PFT results and CT chest wo contrast. PFTs show significant response to bronchodilators with significant hyperinflation. CT chest show resolved GGO and lung scarring at the base. We discussed starting a maintenance inhaler as she is having symptoms such as fatigue and chest tightness specifically when singing. She is agreeable with the plan.   ROS All systems were reviewed and are negative except for the above.  Objective:   Vitals:   04/05/24 0833  BP: 130/70  Pulse: 68  Temp: 98.4 F (36.9 C)  TempSrc: Oral  SpO2: 97%  Weight: 225 lb (102.1 kg)  Height: 5' 6 (1.676 m)   97% on  RA BMI Readings from Last 3 Encounters:  04/05/24 36.32 kg/m  03/22/24 35.99 kg/m  03/07/24 37.21 kg/m   Wt Readings from Last 3 Encounters:  04/05/24 225 lb (102.1 kg)  03/22/24 223 lb (101.2 kg)  03/07/24 223 lb 9.6 oz (101.4 kg)    Physical Exam GEN: NAD, Healthy Appearing HEENT: Supple Neck, Reactive Pupils, EOMI  CVS: Normal S1, Normal S2, RRR, No murmurs or ES appreciated  Lungs: Clear bilateral air entry.  Abdomen: Soft, non tender, non distended, + BS  Extremities: Warm and well perfused, No edema  Skin: No suspicious lesions appreciated  Psych: Normal Affect  Ancillary Information   CBC    Component Value Date/Time   WBC 5.6 12/29/2023 1150   WBC 6.7 11/28/2023 2025   RBC 4.28 12/29/2023 1150   RBC 4.34 11/28/2023 2025   HGB 13.3 12/29/2023 1150   HCT 40.0 12/29/2023 1150   PLT 292 12/29/2023 1150   MCV 94 12/29/2023 1150   MCV 92 06/15/2014 2324   MCH 31.1 12/29/2023 1150   MCH 30.6 11/28/2023 2025   MCHC 33.3 12/29/2023 1150   MCHC 33.3 11/28/2023 2025   RDW 14.3 12/29/2023 1150   RDW 13.0 06/15/2014 2324   LYMPHSABS 1.6 12/29/2023 1150   MONOABS 0.3 01/14/2019 1314   EOSABS 0.3 12/29/2023 1150   BASOSABS 0.1 12/29/2023 1150   Labs and imaging were reviewed.     Latest Ref Rng & Units 02/16/2024  10:51 AM  PFT Results  FVC-Pre L 2.86   FVC-Predicted Pre % 78   FVC-Post L 3.10   FVC-Predicted Post % 84   Pre FEV1/FVC % % 84   Post FEV1/FCV % % 82   FEV1-Pre L 2.41   FEV1-Predicted Pre % 84   FEV1-Post L 2.55   DLCO uncorrected ml/min/mmHg 20.59   DLCO UNC% % 94   DLVA Predicted % 113   TLC L 8.82   TLC % Predicted % 164   RV % Predicted % 288      Assessment & Plan:  [name] is a pleasant 58 year old female patient with a past medical history of psoriatic arthritis on Tremfya  every 8 weeks, asthma as a child presenting today to the pulmonary clinic to establish care.  #Mild persistent asthma Allergen panel underwhelming.  PFTs  with significant bronchodilator response. Hyperinflation noted.  FENO < 5 negative for intrapulmonary eosinophilic inflammation.   []  Start budesonide -formoterol  [Symbicort ] 160-4.5 2puffs twice a day. Rinse mouth with water post use.  []  Albuterol  as needed.    Return in about 6 months (around 10/06/2024).  I spent 40 minutes caring for this patient today, including preparing to see the patient, obtaining a medical history , reviewing a separately obtained history, performing a medically appropriate examination and/or evaluation, counseling and educating the patient/family/caregiver, documenting clinical information in the electronic health record, and independently interpreting results (not separately reported/billed) and communicating results to the patient/family/caregiver  Darrin Barn, MD [facility] 04/05/2024 8:53 AM

## 2024-04-06 NOTE — Progress Notes (Signed)
 Called patient to get her scheduled for a physical sometime in October. Please schedule any open after 10/04

## 2024-04-07 ENCOUNTER — Encounter (INDEPENDENT_AMBULATORY_CARE_PROVIDER_SITE_OTHER): Payer: Self-pay | Admitting: Internal Medicine

## 2024-04-07 ENCOUNTER — Other Ambulatory Visit: Payer: Self-pay | Admitting: Family Medicine

## 2024-04-07 ENCOUNTER — Ambulatory Visit (INDEPENDENT_AMBULATORY_CARE_PROVIDER_SITE_OTHER): Admitting: Internal Medicine

## 2024-04-07 VITALS — BP 134/82 | HR 63 | Temp 98.2°F | Ht 65.0 in | Wt 220.0 lb

## 2024-04-07 DIAGNOSIS — R948 Abnormal results of function studies of other organs and systems: Secondary | ICD-10-CM | POA: Insufficient documentation

## 2024-04-07 DIAGNOSIS — R7303 Prediabetes: Secondary | ICD-10-CM | POA: Diagnosis not present

## 2024-04-07 DIAGNOSIS — R0602 Shortness of breath: Secondary | ICD-10-CM | POA: Diagnosis not present

## 2024-04-07 DIAGNOSIS — N183 Chronic kidney disease, stage 3 unspecified: Secondary | ICD-10-CM

## 2024-04-07 DIAGNOSIS — R519 Headache, unspecified: Secondary | ICD-10-CM

## 2024-04-07 DIAGNOSIS — E7800 Pure hypercholesterolemia, unspecified: Secondary | ICD-10-CM | POA: Diagnosis not present

## 2024-04-07 DIAGNOSIS — Z1331 Encounter for screening for depression: Secondary | ICD-10-CM | POA: Diagnosis not present

## 2024-04-07 DIAGNOSIS — Z6836 Body mass index (BMI) 36.0-36.9, adult: Secondary | ICD-10-CM

## 2024-04-07 DIAGNOSIS — R5383 Other fatigue: Secondary | ICD-10-CM | POA: Diagnosis not present

## 2024-04-07 DIAGNOSIS — T50905A Adverse effect of unspecified drugs, medicaments and biological substances, initial encounter: Secondary | ICD-10-CM | POA: Insufficient documentation

## 2024-04-07 DIAGNOSIS — E66812 Obesity, class 2: Secondary | ICD-10-CM

## 2024-04-07 NOTE — Assessment & Plan Note (Signed)
 Patient has a slower than predicted metabolism. IC 1469 vs. calculated 1659. This may contribute to weight gain, chronic fatigue and difficulty losing weight.   We reviewed measures to improve metabolism including not skipping meals, progressive strengthening exercises, increasing protein intake at every meal and maintaining adequate hydration and sleep.

## 2024-04-07 NOTE — Assessment & Plan Note (Signed)
 LDL is not at goal. Elevated LDL may be secondary to nutrition, genetics and spillover effect from excess adiposity. Recommended LDL goal is <70 to reduce the risk of fatty streaks and the progression to obstructive ASCVD in the future.   Her 10 year risk is: The 10-year ASCVD risk score (Arnett DK, et al., 2019) is: 2.2%  Lab Results  Component Value Date   CHOL 197 12/29/2023   HDL 61 12/29/2023   LDLCALC 117 (H) 12/29/2023   LDLDIRECT 178.3 02/13/2012   TRIG 104 12/29/2023   CHOLHDL 3.0 01/12/2019    Continue weight loss therapy, losing 10% or more of body weight may improve condition. Also advised to reduce saturated fats in diet to less than 10% of daily calories.

## 2024-04-07 NOTE — Assessment & Plan Note (Signed)
 Chronic kidney disease stage 3A with a GFR of 50. No proteinuria. Potential contributing factors include obesity and autoimmune conditions like psoriasis. Emphasized importance of blood pressure control and avoiding nephrotoxic medications. Advised to monitor protein intake to avoid excessive nitrogen load on kidneys. - Monitor blood pressure at home, aiming for under 130/80 mmHg - Avoid NSAIDs like Aleve or ibuprofen  - Ensure adequate hydration - Advise caution with contrast studies and inform healthcare providers of kidney disease

## 2024-04-07 NOTE — Assessment & Plan Note (Signed)
 Most recent A1c is  Lab Results  Component Value Date   HGBA1C 6.2 (H) 12/29/2023   HGBA1C 5.8 10/16/2006    Patient aware of disease state and risk of progression. This may contribute to abnormal cravings, fatigue and diabetic complications without having diabetes.   We have discussed treatment options which include: losing 7 to 10% of body weight, increasing physical activity to a goal of 150 minutes a week at moderate intensity.  Advised to maintain a diet low on simple and processed carbohydrates.  She may also be a candidate for pharmacoprophylaxis with metformin or incretin mimetic.

## 2024-04-07 NOTE — Assessment & Plan Note (Signed)
 Patient is on sertraline , gabapentin  and Symbicort  these may contribute to weight gain.

## 2024-04-07 NOTE — Assessment & Plan Note (Signed)
 Obesity with a patient goal to reduce weight to 150 pounds over a year. Contributing factors include genetics, medications (sertraline , gabapentin , Symbicort ), and lifestyle. Current weight is 220 pounds with a BMI indicating obesity. Slower metabolic rate contributes to weight gain. Previous weight loss attempts with medications like Saxenda  resulted in side effects such as gastroparesis. Emphasis on nutrition, physical activity, and behavioral modifications for sustainable weight loss. A 1200 calorie diet plan is recommended, focusing on low carb, moderate protein, and low processed foods to create a calorie deficit, with a focus on whole foods and reducing fast food intake. - Implement a 1200 calorie diet plan focusing on low carb, moderate protein, and low processed foods.  Protein intake modified to 70 g due to chronic kidney disease - Encourage reduction of fast food intake and increase whole foods - Advise tracking liquid calories and switching to natural sweeteners like monk fruit or stevia - Recommend using the [employer] app for scanning food products to monitor nutritional content - Discussed importance of behavioral modifications, including meal prepping and planning - Encourage gradual changes and focus on sustainable lifestyle modifications

## 2024-04-07 NOTE — Progress Notes (Signed)
 [address],  [place], [place] [address]  Office: [phone]  /  Fax: [phone]   Subjective   Initial Visit  Emma Rogers (MR# [id number]) is a 58 y.o. female who presents for evaluation and treatment of obesity and related comorbidities. Current BMI is Body mass index is 36.61 kg/m. Nusrat has been struggling with her weight for many years and has been unsuccessful in either losing weight, maintaining weight loss, or reaching her healthy weight goal.  Analeya is currently in the action stage of change and ready to dedicate time achieving and maintaining a healthier weight. Jaydn is interested in becoming our patient and working on intensive lifestyle modifications including (but not limited to) diet and exercise for weight loss.  Discussed the use of AI scribe software for clinical note transcription with the patient, who gave verbal consent to proceed.  History of Present Illness   Mrs. Finley Dinkel is a 58 year old female with diastolic heart failure and chronic kidney disease who presents for weight management and dietary counseling.  She has a history of diastolic heart failure. She was previously treated in the emergency room for pneumonia, where imaging indicated fluid buildup. An echocardiogram showed normal left ventricular function but some diastolic dysfunction. She is currently on furosemide .  She has chronic kidney disease, stage 3A, with a GFR of 50 since 2022. There is no proteinuria noted in her recent labs. Her blood pressure is 134/82 mmHg.  She has a history of obesity and is currently working on [employer]. She exercises by using a stationary bike 1-2 times a week and walks about 5,000 steps per day. She has a family history of obesity and acknowledges a genetic component. She has previously lost weight using Saxenda  but developed gastroparesis symptoms, which improved with dose adjustment. She has also tried Weight Watchers and a ketogenic diet, losing 20  pounds previously.  Her current medications include furosemide , atorvastatin  for cholesterol, Wellbutrin  for depression, Symbicort  for asthma, and gabapentin  for back pain. Ssome of these medications may contribute to weight gain.  She has psoriasis and psoriatic arthritis, for which she is taking Tremfya . She reports no diabetes during her pregnancies despite having large babies. Her A1c is 6.2, indicating prediabetes.  Her social history includes living with her husband, who is active and eats healthily. She is retired and does not consume alcohol but drinks soda and coffee occasionally. She eats out five times a week, with fast food three times, and tends to snack on fruit, brownies, and cookies. She skips breakfast, thinking it might help with weight loss, but is not typically hungry in the morning.  In terms of sleep, she snores and wakes up with morning headaches but has not been observed to have apneic episodes. Her Epworth Sleepiness Scale score is 4, indicating low daytime sleepiness.       Weight history:  When asked how their weight has affected their life and health, she states: Has affected self-esteem, Contributed to medical problems, Having fatigue, and Having poor endurance  When asked what else they would like to accomplish? She states: Adopt a healthier eating pattern and lifestyle, Improve energy levels and physical activity, Improve existing medical conditions, Reduce number of medications, and Improve quality of life  She starting to note weight gain during : pregnancy.  Life events associated with weight gain include : pregnancy.   Other contributing factors: family history of obesity, disruption of circadian rhythm / sleep disordered breathing, consumption of processed foods, use of obesogenic  medications: Psychotropic medications and Antiepileptics, moderate to high levels of stress, reduced physical activity, chronic skipping of meals, menopause, and slow metabolism for  age.  Their highest weight has been:  260 lbs.  Desired weight: 150  Previous weight-loss programs : Weight Watchers and Ketogenic.  Their maximum weight loss was:  20 lbs.  Their greatest challenge with dieting: meal preparation and cooking.  Current or previous pharmacotherapy: GLP-1 - Saxenda  , lost 30 lbs, d/c due to SE, constipation  Response to medication: Had side effects so it was discontinued and Lost weight initially but was unable to sustain weight loss   Nutritional History:  Current nutrition plan: None.  How many times do you eat outside the home: 5-7 per week  How often do they skip meals: skips breakfast  What beverages do they drink: caffeinated beverages , juice, and unsweetened tea.   Use of artificial sweetners : Yes  Food intolerances or dislikes: none.  Food triggers: Stress, Boredom, and To help comfort self.  Food cravings: Sugary  Do they struggle with excessive hunger or portion control : Yes    Physical Activity:  See above  Past medical history includes:   Past Medical History:  Diagnosis Date   Abnormal MRI 04/03/2019   Allergic rhinitis    Anxiety    Asthma    Back pain    Blood transfusion without reported diagnosis    Constipation    Depression    Edema of both lower extremities    GERD (gastroesophageal reflux disease)    Headache(784.0)    History of iritis 11/27/2020   History of kidney stones    Hyperlipidemia    Neurocardiogenic syncope    pt was having episodes of syncope prior to pacemaker placement, no issues now   Palpitations    PONV (postoperative nausea and vomiting)    Pre-diabetes    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    Shingles    SOB (shortness of breath)    Vitamin B 12 deficiency      Objective   BP 134/82   Pulse 63   Temp 98.2 F (36.8 C)    Ht 5' 5 (1.651 m)   Wt 220 lb (99.8 kg)   SpO2 98%   BMI 36.61 kg/m  She was weighed on the bioimpedance scale: Body mass index is 36.61 kg/m.    Anthropometrics:  Vitals Temp: 98.2 F (36.8 C) BP: 134/82 Pulse Rate: 63 SpO2: 98 %   Anthropometric Measurements Height: 5' 5 (1.651 m) Weight: 220 lb (99.8 kg) BMI (Calculated): 36.61 Starting Weight: 220 lb Peak Weight: 260 lb Waist Measurement : 42 inches   Body Composition  Body Fat %: 46.4 % Fat Mass (lbs): 102.2 lbs Muscle Mass (lbs): 112 lbs Total Body Water (lbs): 81.2 lbs Visceral Fat Rating : 13   Other Clinical Data RMR: 1469 Fasting: yes Labs: yes Today's Visit #: 1 Starting Date: 04/07/24    Physical Exam:  General: She is overweight, cooperative, alert, well developed, and in no acute distress. PSYCH: Has normal mood, affect and thought process.   HEENT: EOMI, sclerae are anicteric. Lungs: Normal breathing effort, no conversational dyspnea. Extremities: No edema.  Neurologic: No gross sensory or motor deficits. No tremors or fasciculations noted.    Diagnostic Data Reviewed  EKG: Normal sinus rhythm, rate 70  bpm. No conduction abnormalities, abnormal Q waves or chamber enlargement.  Indirect Calorimeter completed today shows a VO2 of 213 and a REE of 1469   .  Her calculated basal metabolic rate is 8340 thus her resting energy expenditure slower than calculated.  Depression Screen  Jenevie's PHQ-9 score was: 8.     04/07/2024    9:32 AM  Depression screen PHQ 2/9  Decreased Interest 0  Down, Depressed, Hopeless 0  PHQ - 2 Score 0  Altered sleeping 3  Tired, decreased energy 2  Change in appetite 2  Feeling bad or failure about yourself  0  Trouble concentrating 1  Moving slowly or fidgety/restless 0  Suicidal thoughts 0  PHQ-9 Score 8  Difficult doing work/chores Somewhat difficult    Screening for Sleep Related Breathing Disorders  Daryan admits to daytime somnolence and  admits to waking up still tired. Patient has a history of symptoms of daytime fatigue, morning fatigue, and morning headache. Aleen generally gets 5 or 6 hours of sleep per night, and states that she does not sleep well most nights. Snoring is present. Apneic episodes are not present. Epworth Sleepiness Score is 4.   BMET    Component Value Date/Time   NA 140 12/29/2023 1150   NA 135 (L) 06/15/2014 2324   K 4.4 12/29/2023 1150   K 3.3 (L) 06/15/2014 2324   CL 102 12/29/2023 1150   CL 98 06/15/2014 2324   CO2 23 12/29/2023 1150   CO2 31 06/15/2014 2324   GLUCOSE 104 (H) 12/29/2023 1150   GLUCOSE 142 (H) 11/28/2023 2025   GLUCOSE 122 (H) 06/15/2014 2324   BUN 22 12/29/2023 1150   BUN 15 06/15/2014 2324   CREATININE 1.27 (H) 12/29/2023 1150   CREATININE 0.95 03/30/2023 1103   CALCIUM  9.3 12/29/2023 1150   CALCIUM  9.2 06/15/2014 2324   GFRNONAA 53 (L) 11/28/2023 2025   GFRNONAA 49 (L) 11/01/2018 1156   GFRAA 76 01/12/2020 1607   GFRAA 57 (L) 11/01/2018 1156   Lab Results  Component Value Date   HGBA1C 6.2 (H) 12/29/2023   HGBA1C 5.8 10/16/2006   No results found for: INSULIN  CBC    Component Value Date/Time   WBC 5.6 12/29/2023 1150   WBC 6.7 11/28/2023 2025   RBC 4.28 12/29/2023 1150   RBC 4.34 11/28/2023 2025   HGB 13.3 12/29/2023 1150   HCT 40.0 12/29/2023 1150   PLT 292 12/29/2023 1150   MCV 94 12/29/2023 1150   MCV 92 06/15/2014 2324   MCH 31.1 12/29/2023 1150   MCH 30.6 11/28/2023 2025   MCHC 33.3 12/29/2023 1150   MCHC 33.3 11/28/2023 2025   RDW 14.3 12/29/2023 1150   RDW 13.0 06/15/2014 2324   Iron/TIBC/Ferritin/ %Sat No results found for: IRON, TIBC, FERRITIN, IRONPCTSAT Lipid Panel     Component Value Date/Time   CHOL 197 12/29/2023 1150   TRIG 104 12/29/2023 1150   HDL 61 12/29/2023 1150   CHOLHDL 3.0 01/12/2019 1327   CHOLHDL 4.3 11/01/2018 1156   VLDL 12.8 02/13/2012 1039   LDLCALC 117 (H) 12/29/2023 1150   LDLCALC 162 (H) 11/01/2018  1156   LDLDIRECT 178.3 02/13/2012 1039   Hepatic Function Panel     Component Value Date/Time   PROT 6.3 12/29/2023 1150   ALBUMIN  4.1 12/29/2023 1150   AST 17 12/29/2023 1150   ALT 16 12/29/2023 1150   ALKPHOS 79 12/29/2023 1150   BILITOT 0.5 12/29/2023 1150   BILIDIR 0.1 02/13/2012 1039  Component Value Date/Time   TSH 0.957 06/30/2023 1513     Assessment and Plan   TREATMENT PLAN FOR OBESITY:  Recommended Dietary Goals  Toia is currently in the action stage of change. As such, her goal is to implement medically supervised obesity management plan.  She has agreed to implement: the Category 2 plan - 1200 kcal per day with 70 g of protein per day due to chronic kidney disease  Behavioral Intervention  We discussed the following Behavioral Modification Strategies today: increasing lean protein intake to established goals, decreasing simple carbohydrates , increasing vegetables, increasing lower glycemic fruits, increasing fiber rich foods, avoiding skipping meals, increasing water intake, work on meal planning and preparation, reading food labels , keeping healthy foods at home, identifying sources and decreasing liquid calories, decreasing eating out or consumption of processed foods, and making healthy choices when eating convenient foods, planning for success, and better snacking choices  Additional resources provided today: Handout on healthy eating and balanced plate and Handout on complex carbohydrates and lean sources of protein  Recommended Physical Activity Goals  Tiwana has been advised to work up to 150 minutes of moderate intensity aerobic activity a week and strengthening exercises 2-3 times per week for cardiovascular health, weight loss maintenance and preservation of muscle mass.   She has agreed to :  Think about enjoyable ways to increase daily physical activity and overcoming barriers to exercise and Increase physical activity in their day and reduce  sedentary time (increase NEAT).  Medical Interventions and Pharmacotherapy We will work on building a Therapist, art and behavioral strategies. We will discuss the role of pharmacotherapy as an adjunct at subsequent visits.   ASSOCIATED CONDITIONS ADDRESSED TODAY  Other Fatigue Maury admits to daytime somnolence and admits to waking up still tired. Patient has a history of symptoms of daytime fatigue, morning fatigue, and morning headache. Zuleyma generally gets 5 or 6 hours of sleep per night, and states that she does not sleep well most nights. Snoring is present. Apneic episodes are not present. Epworth Sleepiness Score is 4. SABRA Jhonnie does feel that her weight is causing her energy to be lower than it should be. Fatigue may be related to obesity, depression or many other causes. Labs will be ordered, and in the meanwhile, Hayly will focus on self care including making healthy food choices, increasing physical activity and focusing on stress reduction.  Shortness of Breath Nicola notes increasing shortness of breath with physical activity and seems to be worsening over time with weight gain. She notes getting out of breath sooner with activity than she used to. This has not gotten worse recently. Ivy denies shortness of breath at rest or orthopnea.  Assessment & Plan Other fatigue  SOB (shortness of breath) on exertion I reviewed previous echo she has diastolic grade 1 dysfunction had a normal BNP which could be abnormally low in obesity.  Losing 10 to 15% of body weight may improve breathing mechanics and also asthma Depression screen  Weight gain due to medication Patient is on sertraline , gabapentin  and Symbicort  these may contribute to weight gain. Stage 3 chronic kidney disease, unspecified whether stage 3a or 3b CKD (HCC) Chronic kidney disease stage 3A with a GFR of 50. No proteinuria. Potential contributing factors include obesity and autoimmune conditions  like psoriasis. Emphasized importance of blood pressure control and avoiding nephrotoxic medications. Advised to monitor protein intake to avoid excessive nitrogen load on kidneys. - Monitor blood pressure at home, aiming for under  130/80 mmHg - Avoid NSAIDs like Aleve or ibuprofen  - Ensure adequate hydration - Advise caution with contrast studies and inform healthcare providers of kidney disease Prediabetes Most recent A1c is  Lab Results  Component Value Date   HGBA1C 6.2 (H) 12/29/2023   HGBA1C 5.8 10/16/2006    Patient aware of disease state and risk of progression. This may contribute to abnormal cravings, fatigue and diabetic complications without having diabetes.   We have discussed treatment options which include: losing 7 to 10% of body weight, increasing physical activity to a goal of 150 minutes a week at moderate intensity.  Advised to maintain a diet low on simple and processed carbohydrates.  She may also be a candidate for pharmacoprophylaxis with metformin or incretin mimetic.   Hypercholesteremia LDL is not at goal. Elevated LDL may be secondary to nutrition, genetics and spillover effect from excess adiposity. Recommended LDL goal is <70 to reduce the risk of fatty streaks and the progression to obstructive ASCVD in the future.   Her 10 year risk is: The 10-year ASCVD risk score (Arnett DK, et al., 2019) is: 2.2%  Lab Results  Component Value Date   CHOL 197 12/29/2023   HDL 61 12/29/2023   LDLCALC 117 (H) 12/29/2023   LDLDIRECT 178.3 02/13/2012   TRIG 104 12/29/2023   CHOLHDL 3.0 01/12/2019    Continue weight loss therapy, losing 10% or more of body weight may improve condition. Also advised to reduce saturated fats in diet to less than 10% of daily calories.      Class 2 severe obesity with serious comorbidity and body mass index (BMI) of 36.0 to 36.9 in adult, unspecified obesity type (HCC) Obesity with a patient goal to reduce weight to 150 pounds over a  year. Contributing factors include genetics, medications (sertraline , gabapentin , Symbicort ), and lifestyle. Current weight is 220 pounds with a BMI indicating obesity. Slower metabolic rate contributes to weight gain. Previous weight loss attempts with medications like Saxenda  resulted in side effects such as gastroparesis. Emphasis on nutrition, physical activity, and behavioral modifications for sustainable weight loss. A 1200 calorie diet plan is recommended, focusing on low carb, moderate protein, and low processed foods to create a calorie deficit, with a focus on whole foods and reducing fast food intake. - Implement a 1200 calorie diet plan focusing on low carb, moderate protein, and low processed foods.  Protein intake modified to 70 g due to chronic kidney disease - Encourage reduction of fast food intake and increase whole foods - Advise tracking liquid calories and switching to natural sweeteners like monk fruit or stevia - Recommend using the YUKA app for scanning food products to monitor nutritional content - Discussed importance of behavioral modifications, including meal prepping and planning - Encourage gradual changes and focus on sustainable lifestyle modifications Abnormal metabolism Patient has a slower than predicted metabolism. IC 1469 vs. calculated 1659. This may contribute to weight gain, chronic fatigue and difficulty losing weight.   We reviewed measures to improve metabolism including not skipping meals, progressive strengthening exercises, increasing protein intake at every meal and maintaining adequate hydration and sleep.     General Health Maintenance Emphasized the importance of exercise, particularly strengthening exercises for post-menopausal women to maintain muscle mass and support metabolic health. Encouraged increased physical  activity and incorporation of strengthening exercises. Highlighted the role of exercise in managing prediabetes and insulin   resistance. - Aim for 115 minutes of aerobic exercise per week - Incorporate strengthening exercises 2-3 times per week -  Consider local resources like gyms or aquatic centers for exercise options  Follow-up Follow-up plans to monitor progress and adjust treatment as necessary. Emphasized the importance of sustainable lifestyle changes over rapid weight loss. - Schedule follow-up appointment to assess weight loss progress and adjust plan as needed - Conduct fasting blood sugar and insulin  level tests       Follow-up  She was informed of the importance of frequent follow-up visits to maximize her success with intensive lifestyle modifications for her multiple health conditions. She was informed we would discuss her lab results at her next visit unless there is a critical issue that needs to be addressed sooner. Lyriq agreed to keep her next visit at the agreed upon time to discuss these results.  Attestation Statement  This is the patient's intake visit at [facility]. The patient's Health Questionnaire was reviewed at length. Included in the packet: current and past health history, medications, allergies, ROS, gynecologic history (women only), surgical history, family history, social history, weight history, weight loss surgery history (for those that have had weight loss surgery), nutritional evaluation, mood and food questionnaire, PHQ9, Epworth questionnaire, sleep habits questionnaire, patient life and health improvement goals questionnaire. These will all be scanned into the patient's chart under media.   During the visit, I independently reviewed the patient's , previous labs, bioimpedance scale results, and indirect calorimetry results. I used this information to medically tailor a meal plan for the patient that will help her to lose weight and will improve her obesity-related conditions. I performed a medically necessary appropriate examination and/or evaluation. I  discussed the assessment and treatment plan with the patient. The patient was provided an opportunity to ask questions and all were answered. The patient agreed with the plan and demonstrated an understanding of the instructions. Labs were ordered at this visit and will be reviewed at the next visit unless critical results need to be addressed immediately. Clinical information was updated and documented in the EMR.   In addition, they received basic education on identification of processed foods and reduction of these, different sources of lean proteins and complex carbohydrates and how to eat balanced by incorporation of whole foods.  Reviewed by clinician on day of visit: allergies, medications, problem list, medical history, surgical history, family history, social history, and previous encounter notes.  I have spent 61 minutes in the care of the patient today including: 6 minutes before the visit reviewing and preparing the chart. 47 minutes face-to-face assessing and reviewing listed medical problems as outlined in obesity care plan, providing nutritional and behavioral counseling on topics outlined in the obesity care plan, independently interpreting test results and goals of care, as described in assessment and plan, reviewing and discussing biometric information and progress, reviewing latest PCP notes and specialist consultations, ordering diagnostics - see orders, and reviewing imaging studies in patient's record 8 minutes after the visit updating chart and documentation of encounter.       Lucas Parker, MD

## 2024-04-08 LAB — GLUCOSE, FASTING: Glucose, Plasma: 101 mg/dL — ABNORMAL HIGH (ref 70–99)

## 2024-04-08 LAB — INSULIN, RANDOM: INSULIN: 16.5 u[IU]/mL (ref 2.6–24.9)

## 2024-04-11 ENCOUNTER — Other Ambulatory Visit

## 2024-04-11 DIAGNOSIS — R519 Headache, unspecified: Secondary | ICD-10-CM | POA: Diagnosis not present

## 2024-04-11 NOTE — Progress Notes (Signed)
 Called patient and left a message for her to call back to get scheduled for her physical in October

## 2024-04-12 DIAGNOSIS — M5440 Lumbago with sciatica, unspecified side: Secondary | ICD-10-CM | POA: Diagnosis not present

## 2024-04-12 DIAGNOSIS — M5416 Radiculopathy, lumbar region: Secondary | ICD-10-CM | POA: Diagnosis not present

## 2024-04-12 DIAGNOSIS — Z6836 Body mass index (BMI) 36.0-36.9, adult: Secondary | ICD-10-CM | POA: Diagnosis not present

## 2024-04-13 ENCOUNTER — Ambulatory Visit: Payer: Self-pay | Admitting: Family Medicine

## 2024-04-15 ENCOUNTER — Telehealth: Payer: Self-pay | Admitting: Cardiology

## 2024-04-15 LAB — COMPREHENSIVE METABOLIC PANEL WITH GFR
ALT: 20 IU/L (ref 0–32)
AST: 25 IU/L (ref 0–40)
Albumin: 4.5 g/dL (ref 3.8–4.9)
Alkaline Phosphatase: 83 IU/L (ref 44–121)
BUN/Creatinine Ratio: 25 — ABNORMAL HIGH (ref 9–23)
BUN: 31 mg/dL — ABNORMAL HIGH (ref 6–24)
Bilirubin Total: 0.6 mg/dL (ref 0.0–1.2)
CO2: 19 mmol/L — ABNORMAL LOW (ref 20–29)
Calcium: 9.5 mg/dL (ref 8.7–10.2)
Chloride: 100 mmol/L (ref 96–106)
Creatinine, Ser: 1.24 mg/dL — ABNORMAL HIGH (ref 0.57–1.00)
Globulin, Total: 2.4 g/dL (ref 1.5–4.5)
Glucose: 86 mg/dL (ref 70–99)
Potassium: 4.3 mmol/L (ref 3.5–5.2)
Sodium: 137 mmol/L (ref 134–144)
Total Protein: 6.9 g/dL (ref 6.0–8.5)
eGFR: 51 mL/min/1.73 — ABNORMAL LOW (ref >59)

## 2024-04-15 LAB — EHRLICHIA ANTIBODY PANEL

## 2024-04-15 LAB — CBC WITH DIFFERENTIAL/PLATELET
Basophils Absolute: 0.1 x10E3/uL (ref 0.0–0.2)
Basos: 1 %
EOS (ABSOLUTE): 0.3 x10E3/uL (ref 0.0–0.4)
Eos: 3 %
Hematocrit: 43.1 % (ref 34.0–46.6)
Hemoglobin: 14.2 g/dL (ref 11.1–15.9)
Immature Grans (Abs): 0 x10E3/uL (ref 0.0–0.1)
Immature Granulocytes: 0 %
Lymphocytes Absolute: 1.7 x10E3/uL (ref 0.7–3.1)
Lymphs: 22 %
MCH: 32.1 pg (ref 26.6–33.0)
MCHC: 32.9 g/dL (ref 31.5–35.7)
MCV: 98 fL — ABNORMAL HIGH (ref 79–97)
Monocytes Absolute: 0.6 x10E3/uL (ref 0.1–0.9)
Monocytes: 7 %
Neutrophils Absolute: 5.3 x10E3/uL (ref 1.4–7.0)
Neutrophils: 67 %
Platelets: 294 x10E3/uL (ref 150–450)
RBC: 4.42 x10E6/uL (ref 3.77–5.28)
RDW: 13.2 % (ref 11.7–15.4)
WBC: 7.9 x10E3/uL (ref 3.4–10.8)

## 2024-04-15 LAB — SPOTTED FEVER GROUP ANTIBODIES
Spotted Fever Group IgG: <1:64 {titer}
Spotted Fever Group IgM: <1:64 {titer}

## 2024-04-15 LAB — BABESIA MICROTI ANTIBODY PANEL
Babesia microti IgG: <1:10 {titer}
Babesia microti IgM: <1:10 {titer}

## 2024-04-15 LAB — LYME DISEASE SEROLOGY W/REFLEX: Lyme Total Antibody EIA: NEGATIVE

## 2024-04-15 LAB — TSH: TSH: 0.985 u[IU]/mL (ref 0.450–4.500)

## 2024-04-15 LAB — VITAMIN D 25 HYDROXY (VIT D DEFICIENCY, FRACTURES): Vit D, 25-Hydroxy: 41.4 ng/mL (ref 30.0–100.0)

## 2024-04-15 NOTE — Telephone Encounter (Signed)
 Spoke with pt regarding her symptoms. Pt stated that for the last few weeks she has been having palpitations and weakness. She stated that she has episodes where she feels her heart rate go up and she become weak and feels like she is going to pass out. The pt stated she has to sit down to keep from falling down. Pt did not have any blood pressures or heart rates to share. Pt stated this is happening more and more frequently. Pt was told to go to the ED to be evaluated. Pt verbalized understanding. All questions if any were answered.

## 2024-04-15 NOTE — Telephone Encounter (Signed)
  Per MyChart scheduling message:  STAT if patient feels like he/she is going to faint   Are you dizzy, lightheaded, or faint now?   Have you passed out? IF YES MOVE TO .SYNCOPECVD  Do you have any other symptoms?   Have you checked your HR and BP (record if available)?    Feeling lightheaded and dizzy. This is what I felt before I received the pacemaker.  1. I feel dizzy and lightheaded while I'm walking around. 2. No I have not passed out. 3. I have had some palpitations when I get dizzy but not every time. 4.128/82

## 2024-04-18 NOTE — Telephone Encounter (Signed)
 Remote transmission received. Normal device function. AT/AF burden <0.1%. No episodes noted.   Patient called to advise remote transmission looks normal and will forward to [place] , Charity fundraiser to update. Patient was appreciative of c/b.

## 2024-04-18 NOTE — Telephone Encounter (Signed)
 Emma Rogers - patient reached out via [employer] stating, I am responding to a voicemail from [place].  I sent a reading of my pacemaker.  I am feeling the same no change.  I advised her that you are in clinic today with Dr. Inocencio, but that I would pass along this message to you!

## 2024-04-18 NOTE — Telephone Encounter (Signed)
 Left detailed message asking pt to send in a transmission, also asked her to call the office/send mychart message and let us  know how she is doing.

## 2024-04-19 NOTE — Telephone Encounter (Signed)
 Pt made aware of MD response.  Pt is going to hold off for now and will let us  know if she would like to wear one at a later date.  She is overdue for routine follow up and aware office will call  to arrange follow up

## 2024-04-20 ENCOUNTER — Encounter (INDEPENDENT_AMBULATORY_CARE_PROVIDER_SITE_OTHER): Payer: Self-pay | Admitting: Internal Medicine

## 2024-04-20 ENCOUNTER — Ambulatory Visit (INDEPENDENT_AMBULATORY_CARE_PROVIDER_SITE_OTHER): Admitting: Internal Medicine

## 2024-04-20 VITALS — BP 119/79 | HR 79 | Temp 98.3°F | Ht 65.0 in | Wt 217.0 lb

## 2024-04-20 DIAGNOSIS — Z6836 Body mass index (BMI) 36.0-36.9, adult: Secondary | ICD-10-CM

## 2024-04-20 DIAGNOSIS — N1831 Chronic kidney disease, stage 3a: Secondary | ICD-10-CM | POA: Diagnosis not present

## 2024-04-20 DIAGNOSIS — R7303 Prediabetes: Secondary | ICD-10-CM | POA: Diagnosis not present

## 2024-04-20 DIAGNOSIS — E66812 Obesity, class 2: Secondary | ICD-10-CM

## 2024-04-20 DIAGNOSIS — R718 Other abnormality of red blood cells: Secondary | ICD-10-CM | POA: Diagnosis not present

## 2024-04-20 DIAGNOSIS — E7800 Pure hypercholesterolemia, unspecified: Secondary | ICD-10-CM

## 2024-04-20 DIAGNOSIS — R948 Abnormal results of function studies of other organs and systems: Secondary | ICD-10-CM

## 2024-04-20 DIAGNOSIS — E88819 Insulin resistance, unspecified: Secondary | ICD-10-CM | POA: Insufficient documentation

## 2024-04-20 MED ORDER — METFORMIN HCL ER 500 MG PO TB24: 500.0000 mg | ORAL_TABLET | Freq: Every day | ORAL | 0 refills | Status: DC

## 2024-04-20 MED ORDER — VITAMIN B-12 1000 MCG PO TABS: 1000.0000 ug | ORAL_TABLET | ORAL | Status: DC

## 2024-04-20 NOTE — Progress Notes (Signed)
 Remote pacemaker transmission.

## 2024-04-20 NOTE — Assessment & Plan Note (Signed)
 A1c is 6.2, indicating prediabetes. HOMA-IR is 4.11, indicating significant insulin  resistance. Weight loss efforts are ongoing with a 1200 calorie diet and exercise. Discussed metformin 's role in weight management and prediabetes, including appetite suppression, improvement of gut bacteria, reduction of visceral fat, and lowering insulin  levels. Discussed potential side effects of metformin , including gastrointestinal upset, and the benefits of the extended-release formulation to improve tolerance. Emphasized the importance of reducing sugar intake to improve metformin  tolerance. - Start metformin  extended-release. [facility] [place]. - Advise to take B12 supplement, 1000 mcg once a week, to prevent potential B12 deficiency from metformin . - Continue 1200 calorie diet and increase physical activity, aiming for at least 150 minutes of aerobic exercise per week and strength training 2-3 times per week. - Advise to monitor sugar intake, aiming for no more than 25 grams of added sugars per day and less than 150 grams of carbohydrates per day.

## 2024-04-20 NOTE — Assessment & Plan Note (Signed)
 Patient has a slower than predicted metabolism. IC 1469 vs. calculated 1659. This may contribute to weight gain, chronic fatigue and difficulty losing weight.   We reviewed measures to improve metabolism including not skipping meals, progressive strengthening exercises, increasing protein intake at every meal and maintaining adequate hydration and sleep.

## 2024-04-20 NOTE — Assessment & Plan Note (Signed)
 Currently managed with atorvastatin . LDL cholesterol has improved to 117 mg/dL. Cardiovascular risk is low at 1.7% over ten years due to well-controlled blood pressure and lipid levels.  Calculator may not represent true risk as she has chronic kidney disease and is also affected by obesity both known to independently increase cardiovascular risk. - Continue atorvastatin  therapy. -Blood pressure is well-controlled

## 2024-04-20 NOTE — Assessment & Plan Note (Signed)
 May be due to Tremfya  she has a history of B12 deficiency so we will check methylmalonic acid and RBC folate.  Denies alcohol consumption she will start B12 1000 mcg weekly as we are starting her on metformin  which may increase the risk of B12 deficiency.

## 2024-04-20 NOTE — Telephone Encounter (Signed)
 Ok for E2C2 to review.  Please transfer to [facility] admin to assist with needed acute/same day appointment. If no options please advise UC.

## 2024-04-20 NOTE — Progress Notes (Signed)
 Office: [phone]  /  Fax: [phone]  Weight Summary and Body Composition Analysis (BIA)  Vitals Temp: 98.3 F (36.8 C) BP: 119/79 Pulse Rate: 79 SpO2: 95 %   Anthropometric Measurements Height: 5' 5 (1.651 m) Weight: 217 lb (98.4 kg) BMI (Calculated): 36.11 Weight at Last Visit: 220 lb Weight Lost Since Last Visit: 3 lb Weight Gained Since Last Visit: 0 lb Starting Weight: 220 lb Total Weight Loss (lbs): 3 lb (1.361 kg) Peak Weight: 260 lb   Body Composition  Body Fat %: 45.6 % Fat Mass (lbs): 99.2 lbs Muscle Mass (lbs): 112.4 lbs Total Body Water (lbs): 82.8 lbs Visceral Fat Rating : 13  The 10-year ASCVD risk score (Arnett DK, et al., 2019) is: 1.7%   RMR: 1469  Today's Visit #: 2  Starting Date: 04/07/24   Subjective   Chief Complaint: Obesity  Interval History  Discussed the use of AI scribe software for clinical note transcription with the patient, who gave verbal consent to proceed.  History of Present Illness   Emma Rogers is a 58 year old female who presents for medical weight management.  She has lost three pounds since her last visit and adheres to a 1200 calorie nutrition plan. She tracks her calories, consumes more whole foods, maintains adequate hydration, and does not skip meals. She exercises once a week for about 20 minutes. Supper is challenging due to time constraints, occasionally leading to fast food consumption, though she remains cautious about her choices.  She has a history of decreased kidney function, currently at stage 3A, with a recent GFR of 51. She recalls a past episode of fluid buildup in her lungs.  She has prediabetes with a fasting blood sugar of 86 and an A1c of 6.2. Her insulin  levels are elevated at 16.5.  She has a history of B12 deficiency and has previously received B12 shots. She denies alcohol consumption and has been experiencing headaches, which she wonders might be related to dehydration.  She  is currently taking Lipitor, which has helped lower her LDL cholesterol to 117 from previous higher levels. She has a history of high LDL cholesterol, previously recorded at 180s and 162.  She has a history of asthma and denies smoking. She had a partial hysterectomy and is unsure if her cervix was removed. She has one remaining ovary. She reports regular mammograms and a Cologuard test last year, with no family history of colon cancer.        Challenges affecting patient progress: medical comorbidities, slow metabolism for age, and menopause.    Pharmacotherapy for weight management: She is currently taking no anti-obesity medication and does not have coverage for GLP-1.   Assessment and Plan   Treatment Plan For Obesity:  Recommended Dietary Goals  Rickiya is currently in the action stage of change. As such, her goal is to continue weight management plan. She has agreed to: continue current plan  Behavioral Health and Counseling  We discussed the following behavioral modification strategies today: continue to work on maintaining a reduced calorie state, getting the recommended amount of protein, incorporating whole foods, making healthy choices, staying well hydrated and practicing mindfulness when eating..  Additional education and resources provided today: Handout on increasing daily activity and exercise goal setting  Recommended Physical Activity Goals  Aalyah has been advised to work up to 150 minutes of moderate intensity aerobic activity a week and strengthening exercises 2-3 times per week for cardiovascular health, weight loss maintenance and preservation  of muscle mass.   She has agreed to :  Increase the intensity, frequency or duration of strengthening exercises  and Increase the intensity, frequency or duration of aerobic exercises    Medical Interventions and Pharmacotherapy  We discussed various medication options to help Fergie with her weight loss efforts and we  both agreed to : Start metformin  XR 500 mg once a day for diabetes prevention and weight management.  She does not have coverage for GLP-1  Associated Conditions Impacted by Obesity Treatment  Assessment & Plan Elevated MCV May be due to Tremfya  she has a history of B12 deficiency so we will check methylmalonic acid and RBC folate.  Denies alcohol consumption she will start B12 1000 mcg weekly as we are starting her on metformin  which may increase the risk of B12 deficiency. Prediabetes Most recent A1c is  Lab Results  Component Value Date   HGBA1C 6.2 (H) 12/29/2023   HGBA1C 5.8 10/16/2006    Patient aware of disease state and risk of progression. This may contribute to abnormal cravings, fatigue and diabetic complications without having diabetes.   We have discussed treatment options which include: losing 7 to 10% of body weight, increasing physical activity to a goal of 150 minutes a week at moderate intensity.  Advised to maintain a diet low on simple and processed carbohydrates.  After discussion of benefits and side effects will be started on metformin  XR 500 mg once a day  Stage 3a chronic kidney disease (HCC) I reviewed about 2 years worth of GFR's.  She has an elevated BUN decreased CO2 this may be secondary to the use of furosemide .  We could check urine creatinine and urea to see if this is prerenal.  Patient advised to increase fluid intake.  She is on furosemide  for history in the past of heart failure.  Patient advised on reducing sodium intake. Class 2 severe obesity with serious comorbidity and body mass index (BMI) of 36.0 to 36.9 in adult, unspecified obesity type (HCC) A1c is 6.2, indicating prediabetes. HOMA-IR is 4.11, indicating significant insulin  resistance. Weight loss efforts are ongoing with a 1200 calorie diet and exercise. Discussed metformin 's role in weight management and prediabetes, including appetite suppression, improvement of gut bacteria, reduction of  visceral fat, and lowering insulin  levels. Discussed potential side effects of metformin , including gastrointestinal upset, and the benefits of the extended-release formulation to improve tolerance. Emphasized the importance of reducing sugar intake to improve metformin  tolerance. - Start metformin  extended-release. [facility] [place]. - Advise to take B12 supplement, 1000 mcg once a week, to prevent potential B12 deficiency from metformin . - Continue 1200 calorie diet and increase physical activity, aiming for at least 150 minutes of aerobic exercise per week and strength training 2-3 times per week. - Advise to monitor sugar intake, aiming for no more than 25 grams of added sugars per day and less than 150 grams of carbohydrates per day. Abnormal metabolism Patient has a slower than predicted metabolism. IC 1469 vs. calculated 1659. This may contribute to weight gain, chronic fatigue and difficulty losing weight.   We reviewed measures to improve metabolism including not skipping meals, progressive strengthening exercises, increasing protein intake at every meal and maintaining adequate hydration and sleep.   Hypercholesteremia Currently managed with atorvastatin . LDL cholesterol has improved to 117 mg/dL. Cardiovascular risk is low at 1.7% over ten years due to well-controlled blood pressure and lipid levels.  Calculator may not represent true risk as she has chronic  kidney disease and is also affected by obesity both known to independently increase cardiovascular risk. - Continue atorvastatin  therapy. -Blood pressure is well-controlled Insulin  resistance Her HOMA-IR is 4.11 which is elevated. Optimal level < 1.9.   This is complex condition associated with genetics, ectopic fat and lifestyle factors. Insulin  resistance may result in increased fat storage, inhibition of the breakdown of fat, cause fluctuations in blood sugar leading to energy crashes and increased cravings for  sugary or high carb foods and cause metabolic slowdown making it difficult to lose weight.  This may result in additional weight gain and lead to pre-diabetes and diabetes if untreated. In addition, hyperinsulinemia increases cardiovascular risk, chronic inflammatory response and may increase the risk of obesity related malignancies.  Lab Results  Component Value Date   HGBA1C 6.2 (H) 12/29/2023   Lab Results  Component Value Date   INSULIN  16.5 04/07/2024   Lab Results  Component Value Date   GLUCOSE 86 04/11/2024   GLUCOSE 101 (H) 10/16/2006    We reviewed treatment options which include losing 7 to 10% of body weight, increasing volume of physical activity and maintaining a diet low in saturated fats and with a low glycemic load.  Patient has also been educated on the carb insulin  model of obesity.  After discussion of benefits and side effects will be started on metformin  XR 500 mg once a day.  Maximum will be at 1000 mg considering CKD         Objective   Physical Exam:  Blood pressure 119/79, pulse 79, temperature 98.3 F (36.8 C), height 5' 5 (1.651 m), weight 217 lb (98.4 kg), SpO2 95%. Body mass index is 36.11 kg/m.  General: She is overweight, cooperative, alert, well developed, and in no acute distress. PSYCH: Has normal mood, affect and thought process.   HEENT: EOMI, sclerae are anicteric. Lungs: Normal breathing effort, no conversational dyspnea. Extremities: No edema.  Neurologic: No gross sensory or motor deficits. No tremors or fasciculations noted.    Diagnostic Data Reviewed:  BMET    Component Value Date/Time   NA 137 04/11/2024 1453   NA 135 (L) 06/15/2014 2324   K 4.3 04/11/2024 1453   K 3.3 (L) 06/15/2014 2324   CL 100 04/11/2024 1453   CL 98 06/15/2014 2324   CO2 19 (L) 04/11/2024 1453   CO2 31 06/15/2014 2324   GLUCOSE 86 04/11/2024 1453   GLUCOSE 142 (H) 11/28/2023 2025   GLUCOSE 122 (H) 06/15/2014 2324   BUN 31 (H) 04/11/2024 1453    BUN 15 06/15/2014 2324   CREATININE 1.24 (H) 04/11/2024 1453   CREATININE 0.95 03/30/2023 1103   CALCIUM  9.5 04/11/2024 1453   CALCIUM  9.2 06/15/2014 2324   GFRNONAA 53 (L) 11/28/2023 2025   GFRNONAA 49 (L) 11/01/2018 1156   GFRAA 76 01/12/2020 1607   GFRAA 57 (L) 11/01/2018 1156   Lab Results  Component Value Date   HGBA1C 6.2 (H) 12/29/2023   HGBA1C 5.8 10/16/2006   Lab Results  Component Value Date   INSULIN  16.5 04/07/2024   Lab Results  Component Value Date   TSH 0.985 04/11/2024   CBC    Component Value Date/Time   WBC 7.9 04/11/2024 1453   WBC 6.7 11/28/2023 2025   RBC 4.42 04/11/2024 1453   RBC 4.34 11/28/2023 2025   HGB 14.2 04/11/2024 1453   HCT 43.1 04/11/2024 1453   PLT 294 04/11/2024 1453   MCV 98 (H) 04/11/2024 1453   MCV 92  06/15/2014 2324   MCH 32.1 04/11/2024 1453   MCH 30.6 11/28/2023 2025   MCHC 32.9 04/11/2024 1453   MCHC 33.3 11/28/2023 2025   RDW 13.2 04/11/2024 1453   RDW 13.0 06/15/2014 2324   Iron Studies No results found for: IRON, TIBC, FERRITIN, IRONPCTSAT Lipid Panel     Component Value Date/Time   CHOL 197 12/29/2023 1150   TRIG 104 12/29/2023 1150   HDL 61 12/29/2023 1150   CHOLHDL 3.0 01/12/2019 1327   CHOLHDL 4.3 11/01/2018 1156   VLDL 12.8 02/13/2012 1039   LDLCALC 117 (H) 12/29/2023 1150   LDLCALC 162 (H) 11/01/2018 1156   LDLDIRECT 178.3 02/13/2012 1039   Hepatic Function Panel     Component Value Date/Time   PROT 6.9 04/11/2024 1453   ALBUMIN  4.5 04/11/2024 1453   AST 25 04/11/2024 1453   ALT 20 04/11/2024 1453   ALKPHOS 83 04/11/2024 1453   BILITOT 0.6 04/11/2024 1453   BILIDIR 0.1 02/13/2012 1039      Component Value Date/Time   TSH 0.985 04/11/2024 1453   Nutritional Lab Results  Component Value Date   VD25OH 41.4 04/11/2024   VD25OH 31.8 12/29/2023   VD25OH 40.4 06/30/2023    Medications: Outpatient Encounter Medications as of 04/20/2024  Medication Sig   albuterol  (VENTOLIN  HFA) 108  (90 Base) MCG/ACT inhaler Inhale 2 puffs into the lungs every 6 (six) hours as needed for wheezing or shortness of breath.   atorvastatin  (LIPITOR) 40 MG tablet Take 1 tablet (40 mg total) by mouth at bedtime.   budesonide -formoterol  (SYMBICORT ) 160-4.5 MCG/ACT inhaler Inhale 2 puffs into the lungs in the morning and at bedtime.   buPROPion  (WELLBUTRIN  XL) 150 MG 24 hr tablet Take 1 tablet (150 mg total) by mouth daily. To be taken with Wellbutrin  300mg  to make 450mg .   buPROPion  (WELLBUTRIN  XL) 300 MG 24 hr tablet Take 1 tablet (300 mg total) by mouth daily. To be taken with Wellbutrin  300mg  to make 450mg .   cyanocobalamin  (VITAMIN B12) 1000 MCG tablet Take 1 tablet (1,000 mcg total) by mouth once a week.   furosemide  (LASIX ) 20 MG tablet TAKE 1 TABLET BY MOUTH EVERY DAY AS NEEDED   gabapentin  (NEURONTIN ) 600 MG tablet Take 1 tablet (600 mg total) by mouth 3 (three) times daily. (Patient taking differently: Take 400 mg by mouth daily at 12 noon.)   guselkumab  (TREMFYA ) 100 MG/ML pen Inject 100 mg into the skin every 8 weeks   metFORMIN  (GLUCOPHAGE -XR) 500 MG 24 hr tablet Take 1 tablet (500 mg total) by mouth daily with breakfast.   rizatriptan  (MAXALT ) 10 MG tablet Take 1 tablet (10 mg total) by mouth as needed for migraine. May repeat in 2 hours if needed   sertraline  (ZOLOFT ) 100 MG tablet Take 1.5 tablets (150 mg total) by mouth daily.   traZODone  (DESYREL ) 100 MG tablet TAKE 2 TABLETS BY MOUTH AT BEDTIME   ondansetron  (ZOFRAN ) 4 MG tablet Take 1 tablet (4 mg total) by mouth every 8 (eight) hours as needed for nausea or vomiting. (Patient not taking: Reported on 04/07/2024)   No facility-administered encounter medications on file as of 04/20/2024.     Follow-Up   Return in about 3 weeks (around 05/11/2024) for For Weight Mangement with Dr. Francyne.SABRA She was informed of the importance of frequent follow up visits to maximize her success with intensive lifestyle modifications for her multiple  health conditions.  Attestation Statement   Reviewed by clinician on day of visit: allergies,  medications, problem list, medical history, surgical history, family history, social history, and previous encounter notes.   I have spent 47 minutes in the care of the patient today including: 3 minutes before the visit reviewing and preparing the chart. 35 minutes face-to-face assessing and reviewing listed medical problems as outlined in obesity care plan, providing nutritional and behavioral counseling on topics outlined in the obesity care plan, counseling regarding anti-obesity medication as outlined in obesity care plan, independently interpreting test results and goals of care, as described in assessment and plan, reviewing and discussing biometric information and progress, ordering diagnostics - see orders, and ordering medications - see orders 9 minutes after the visit updating chart and documentation of encounter.    Lucas Parker, MD

## 2024-04-20 NOTE — Assessment & Plan Note (Signed)
 Her HOMA-IR is 4.11 which is elevated. Optimal level < 1.9.   This is complex condition associated with genetics, ectopic fat and lifestyle factors. Insulin  resistance may result in increased fat storage, inhibition of the breakdown of fat, cause fluctuations in blood sugar leading to energy crashes and increased cravings for sugary or high carb foods and cause metabolic slowdown making it difficult to lose weight.  This may result in additional weight gain and lead to pre-diabetes and diabetes if untreated. In addition, hyperinsulinemia increases cardiovascular risk, chronic inflammatory response and may increase the risk of obesity related malignancies.  Lab Results  Component Value Date   HGBA1C 6.2 (H) 12/29/2023   Lab Results  Component Value Date   INSULIN  16.5 04/07/2024   Lab Results  Component Value Date   GLUCOSE 86 04/11/2024   GLUCOSE 101 (H) 10/16/2006    We reviewed treatment options which include losing 7 to 10% of body weight, increasing volume of physical activity and maintaining a diet low in saturated fats and with a low glycemic load.  Patient has also been educated on the carb insulin  model of obesity.  After discussion of benefits and side effects will be started on metformin  XR 500 mg once a day.  Maximum will be at 1000 mg considering CKD

## 2024-04-20 NOTE — Assessment & Plan Note (Signed)
 I reviewed about 2 years worth of GFR's.  She has an elevated BUN decreased CO2 this may be secondary to the use of furosemide .  We could check urine creatinine and urea to see if this is prerenal.  Patient advised to increase fluid intake.  She is on furosemide  for history in the past of heart failure.  Patient advised on reducing sodium intake.

## 2024-04-20 NOTE — Addendum Note (Signed)
 Addended by: TAWNI DRILLING D on: 04/20/2024 05:39 PM   Modules accepted: Orders

## 2024-04-20 NOTE — Assessment & Plan Note (Signed)
 Most recent A1c is  Lab Results  Component Value Date   HGBA1C 6.2 (H) 12/29/2023   HGBA1C 5.8 10/16/2006    Patient aware of disease state and risk of progression. This may contribute to abnormal cravings, fatigue and diabetic complications without having diabetes.   We have discussed treatment options which include: losing 7 to 10% of body weight, increasing physical activity to a goal of 150 minutes a week at moderate intensity.  Advised to maintain a diet low on simple and processed carbohydrates.  After discussion of benefits and side effects will be started on metformin  XR 500 mg once a day

## 2024-04-23 LAB — FOLATE RBC
Folate, Hemolysate: 406 ng/mL
Folate, RBC: 951 ng/mL (ref >498)
Hematocrit: 42.7 % (ref 34.0–46.6)

## 2024-04-23 LAB — METHYLMALONIC ACID, SERUM: Methylmalonic Acid: 150 nmol/L (ref 0–378)

## 2024-04-25 NOTE — Telephone Encounter (Signed)
 Call x1; [email or web address] and MyChart message sent to patient, to schedule from recall for 1 yr f/u - due 12/2023, with [place].

## 2024-04-26 ENCOUNTER — Ambulatory Visit (INDEPENDENT_AMBULATORY_CARE_PROVIDER_SITE_OTHER): Payer: Self-pay | Admitting: Internal Medicine

## 2024-05-03 ENCOUNTER — Other Ambulatory Visit: Payer: Self-pay | Admitting: Family Medicine

## 2024-05-05 ENCOUNTER — Encounter: Payer: Self-pay | Admitting: Family Medicine

## 2024-05-05 NOTE — Telephone Encounter (Signed)
 6 month supply sent in in April. Can we please check on this?

## 2024-05-06 NOTE — Telephone Encounter (Signed)
 Too soon for refill, LRF 01/15/24 for 90 and 1 RF.  Requested Prescriptions  Pending Prescriptions Disp Refills   furosemide  (LASIX ) 20 MG tablet [Pharmacy Med Name: FUROSEMIDE  20 MG TABLET] 90 tablet 1    Sig: TAKE 1 TABLET BY MOUTH EVERY DAY AS NEEDED     Cardiovascular:  Diuretics - Loop Failed - 05/06/2024  9:15 AM      Failed - Cr in normal range and within 180 days    Creat  Date Value Ref Range Status  03/30/2023 0.95 0.50 - 1.03 mg/dL Final   Creatinine, Ser  Date Value Ref Range Status  04/11/2024 1.24 (H) 0.57 - 1.00 mg/dL Final         Failed - Mg Level in normal range and within 180 days    Magnesium  Date Value Ref Range Status  01/28/2023 2.2 1.6 - 2.3 mg/dL Final         Passed - K in normal range and within 180 days    Potassium  Date Value Ref Range Status  04/11/2024 4.3 3.5 - 5.2 mmol/L Final  06/15/2014 3.3 (L) 3.5 - 5.1 mmol/L Final         Passed - Ca in normal range and within 180 days    Calcium   Date Value Ref Range Status  04/11/2024 9.5 8.7 - 10.2 mg/dL Final   Calcium , Total  Date Value Ref Range Status  06/15/2014 9.2 8.5 - 10.1 mg/dL Final         Passed - Na in normal range and within 180 days    Sodium  Date Value Ref Range Status  04/11/2024 137 134 - 144 mmol/L Final  06/15/2014 135 (L) 136 - 145 mmol/L Final         Passed - Cl in normal range and within 180 days    Chloride  Date Value Ref Range Status  04/11/2024 100 96 - 106 mmol/L Final  06/15/2014 98 98 - 107 mmol/L Final         Passed - Last BP in normal range    BP Readings from Last 1 Encounters:  04/20/24 119/79         Passed - Valid encounter within last 6 months    Recent Outpatient Visits           1 month ago MDD (major depressive disorder), recurrent episode, moderate (HCC)   [place] [facility] [place], Megan P, DO   2 months ago Herpes zoster without complication   [place] [facility] Melvin Pao, NP   2  months ago Herpes zoster without complication   [place] [facility] Melvin Pao, NP   2 months ago MDD (major depressive disorder), recurrent episode, moderate (HCC)   [place] [facility] [place], Megan P, DO   4 months ago MDD (major depressive disorder), recurrent episode, moderate (HCC)   [place] [facility] [place], [place], DO

## 2024-05-13 ENCOUNTER — Other Ambulatory Visit (INDEPENDENT_AMBULATORY_CARE_PROVIDER_SITE_OTHER): Payer: Self-pay | Admitting: Internal Medicine

## 2024-05-13 DIAGNOSIS — R7303 Prediabetes: Secondary | ICD-10-CM

## 2024-05-16 DIAGNOSIS — M5417 Radiculopathy, lumbosacral region: Secondary | ICD-10-CM | POA: Diagnosis not present

## 2024-05-17 ENCOUNTER — Ambulatory Visit (INDEPENDENT_AMBULATORY_CARE_PROVIDER_SITE_OTHER): Admitting: Internal Medicine

## 2024-05-17 ENCOUNTER — Encounter (INDEPENDENT_AMBULATORY_CARE_PROVIDER_SITE_OTHER): Payer: Self-pay | Admitting: Internal Medicine

## 2024-05-17 VITALS — BP 103/73 | HR 81 | Temp 98.5°F | Ht 65.0 in | Wt 217.0 lb

## 2024-05-17 DIAGNOSIS — R718 Other abnormality of red blood cells: Secondary | ICD-10-CM

## 2024-05-17 DIAGNOSIS — R948 Abnormal results of function studies of other organs and systems: Secondary | ICD-10-CM

## 2024-05-17 DIAGNOSIS — Z6836 Body mass index (BMI) 36.0-36.9, adult: Secondary | ICD-10-CM

## 2024-05-17 DIAGNOSIS — R7303 Prediabetes: Secondary | ICD-10-CM

## 2024-05-17 DIAGNOSIS — N1831 Chronic kidney disease, stage 3a: Secondary | ICD-10-CM

## 2024-05-17 DIAGNOSIS — E66812 Obesity, class 2: Secondary | ICD-10-CM

## 2024-05-17 MED ORDER — WEGOVY 0.25 MG/0.5ML ~~LOC~~ SOAJ: 0.2500 mg | SUBCUTANEOUS | 0 refills | Status: DC

## 2024-05-17 NOTE — Assessment & Plan Note (Signed)
 Patient has a slower than predicted metabolism. IC 1469 vs. calculated 1659. This may contribute to weight gain, chronic fatigue and difficulty losing weight.   We reviewed measures to improve metabolism including not skipping meals, progressive strengthening exercises, increasing protein intake at every meal and maintaining adequate hydration and sleep.

## 2024-05-17 NOTE — Progress Notes (Signed)
 Office: [phone]  /  Fax: [phone]  Weight Summary and Body Composition Analysis (BIA)  Vitals Temp: 98.5 F (36.9 C) BP: 103/73 Pulse Rate: 81 SpO2: 98 %   Anthropometric Measurements Height: 5' 5 (1.651 m) Weight: 217 lb (98.4 kg) BMI (Calculated): 36.11 Weight at Last Visit: 217lb Weight Lost Since Last Visit: 0lb Weight Gained Since Last Visit: 0lb Starting Weight: 220lb Total Weight Loss (lbs): 3 lb (1.361 kg) Peak Weight: 260lb Waist Measurement : 42 inches   Body Composition  Body Fat %: 45.8 % Fat Mass (lbs): 99.6 lbs Muscle Mass (lbs): 112 lbs Total Body Water (lbs): 81.8 lbs Visceral Fat Rating : 13    RMR: 1469  Today's Visit #: 3  Starting Date: 04/07/24   Subjective   Chief Complaint: Obesity  Interval History Discussed the use of AI scribe software for clinical note transcription with the patient, who gave verbal consent to proceed.  History of Present Illness [name] is a 58 year old female with insulin  resistance, hypercholesterolemia, and stage three A chronic kidney disease who presents for medical weight management.  She is following a 1200-calorie nutrition plan with fair adherence.  She is exercising 4 days a week about 30 minutes mostly cardio.  She reports adequate hydration getting the recommended amount of protein and eating more whole foods she is also tracking calories.  Since last office visit she has maintained.  She has maintained her weight since the last office visit despite a recent vacation where she did not adhere strictly to her diet. She follows a low-carb meal plan and is conscious of her food intake, especially during events like her birthday where she made efforts to cut back on certain meals.  She has a history of using Saxenda  for weight management but did not change her diet or habits during that time, leading to weight regain after discontinuation. Currently, she is on metformin  for  prediabetes.  She has a history of back issues, including fusion surgery that did not go well, and hopes that weight loss will alleviate some of her back pain.  She consumes around 90 ounces of water daily and aims for a calorie intake of 1200-1400 calories per day, with a focus on maintaining 70-75 grams of protein daily due to her kidney condition.  No alcohol consumption.     Challenges affecting patient progress: orthopedic problems, medical conditions or chronic pain affecting mobility, slow metabolism for age, and menopause.    Pharmacotherapy for weight management: She is currently taking Metformin  (off label use for weight management and / or insulin  resistance and / or diabetes prevention) with adequate clinical response  and without side effects. and states interest in starting a medication to aid with weight loss citing difficulty with maintaining a reduced calorie state and weight loss.   Assessment and Plan   Treatment Plan For Obesity:  Recommended Dietary Goals  Emma Rogers is currently in the action stage of change. As such, her goal is to continue weight management plan. She has agreed to: follow the Category 2 plan - 1200 kcal per day, incorporate prepackaged healthy meals for convenience, incorporate 1-2 meal replacements a day for convenience , and continue current plan  Behavioral Health and Counseling  We discussed the following behavioral modification strategies today: continue to work on maintaining a reduced calorie state, getting the recommended amount of protein, incorporating whole foods, making healthy choices, staying well hydrated and practicing mindfulness when eating. and increase protein intake, fibrous foods (  25 grams per day for women, 30 grams for men) and water to improve satiety and decrease hunger signals. .  Additional education and resources provided today: None  Recommended Physical Activity Goals  Emma Rogers has been advised to work up to 150 minutes  of moderate intensity aerobic activity a week and strengthening exercises 2-3 times per week for cardiovascular health, weight loss maintenance and preservation of muscle mass.  She has agreed to :  Think about enjoyable ways to increase daily physical activity and overcoming barriers to exercise, Increase physical activity in their day and reduce sedentary time (increase NEAT)., Increase volume of physical activity to a goal of 240 minutes a week, and Combine aerobic and strengthening exercises for efficiency and improved cardiometabolic health.  Medical Interventions and Pharmacotherapy  We discussed various medication options to help Emma Rogers with her weight loss efforts and we both agreed to : Start anti-obesity medication.  In addition to reduced calorie nutrition plan (RCNP), behavioral strategies and physical activity, Emma Rogers would benefit from pharmacotherapy to assist with hunger signals, satiety and cravings. This will reduce obesity-related health risks by inducing weight loss, and help reduce food consumption and adherence to Emma Rogers) . It may also improve QOL by improving self-confidence and reduce the  setbacks associated with metabolic adaptations.  Patient has a complex constellation of metabolic and cardiovascular risk factors associated with his obesity that significantly elevate her risk for adverse outcomes.  These include heart failure with preserved ejection fraction, asthma, prediabetes with insulin  resistance, chronic kidney disease stage IIIa, hypercholesterolemia, venous insufficiency.  She has contraindications to sympathomimetics and antiepileptics.  She is currently on metformin  for pharmacoprophylaxis.  Feel that patient would benefit tremendously from GLP-1 aided weight loss  After discussion of treatment options, mechanisms of action, benefits, side effects, contraindications and shared decision making she is agreeable to starting Wegovy  0.25 mg once a week. Patient also  made aware that medication is indicated for long-term management of obesity and the risk of weight regain following discontinuation of treatment and hence the importance of adhering to medical weight loss plan.  We demonstrated use of device and patient using teach back method was able to demonstrate proper technique.  Associated Conditions Impacted by Obesity Treatment  Assessment & Plan Prediabetes Most recent A1c is  Lab Results  Component Value Date   HGBA1C 6.2 (H) 12/29/2023   HGBA1C 5.8 10/16/2006    Patient aware of disease state and risk of progression. This may contribute to abnormal cravings, fatigue and diabetic complications without having diabetes.   We have discussed treatment options which include: losing 7 to 10% of body weight, increasing physical activity to a goal of 150 minutes a week at moderate intensity.  Advised to maintain a diet low on simple and processed carbohydrates.  She is currently on metformin  XR 500 mg once a day.  I feel that considering degree of obesity as well as high risk comorbidities she would benefit from GLP-1 aided weight management.  Stage 3a chronic kidney disease (HCC) I reviewed about 2 years worth of GFR's.  Her blood pressure is well-controlled.  Hydration has improved.  She has prediabetes.  Has been counseled on avoiding nephrotoxins maintaining adequate hydration.  She may also benefit from GLP-1 as medication has been shown to be beneficial for patients with chronic kidney disease and delaying progression. Class 2 severe obesity with serious comorbidity and body mass index (BMI) of 36.0 to 36.9 in adult, unspecified obesity type (HCC) See obesity treatment plan. Continue  1200-calorie reduced calorie nutrition plan Maintain protein intake to 75 g/day; she has chronic kidney disease Start GLP-1, Wegovy  0.25 mg once a week.  See above for indications Abnormal metabolism Patient has a slower than predicted metabolism. IC 1469 vs.  calculated 1659. This may contribute to weight gain, chronic fatigue and difficulty losing weight.   We reviewed measures to improve metabolism including not skipping meals, progressive strengthening exercises, increasing protein intake at every meal and maintaining adequate hydration and sleep.   Elevated MCV Reviewed recent labs she had a normal methylmalonic acid and RBC folate.  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  No further workup necessary.  Denies alcohol consumption.        Objective   Physical Exam:  Blood pressure 103/73, pulse 81, temperature 98.5 F (36.9 C), height 5' 5 (1.651 m), weight 217 lb (98.4 kg), SpO2 98%. Body mass index is 36.11 kg/m.  General: She is overweight, cooperative, alert, well developed, and in no acute distress. PSYCH: Has normal mood, affect and thought process.   HEENT: EOMI, sclerae are anicteric. Lungs: Normal breathing effort, no conversational dyspnea. Extremities: No edema.  Neurologic: No gross sensory or motor deficits. No tremors or fasciculations noted.    Diagnostic Data Reviewed:  BMET    Component Value Date/Time   NA 137 04/11/2024 1453   NA 135 (L) 06/15/2014 2324   K 4.3 04/11/2024 1453   K 3.3 (L) 06/15/2014 2324   CL 100 04/11/2024 1453   CL 98 06/15/2014 2324   CO2 19 (L) 04/11/2024 1453   CO2 31 06/15/2014 2324   GLUCOSE 86 04/11/2024 1453   GLUCOSE 142 (H) 11/28/2023 2025   GLUCOSE 122 (H) 06/15/2014 2324   BUN 31 (H) 04/11/2024 1453   BUN 15 06/15/2014 2324   CREATININE 1.24 (H) 04/11/2024 1453   CREATININE 0.95 03/30/2023 1103   CALCIUM  9.5 04/11/2024 1453   CALCIUM  9.2 06/15/2014 2324   GFRNONAA 53 (L) 11/28/2023 2025   GFRNONAA 49 (L) 11/01/2018 1156   GFRAA 76 01/12/2020 1607   GFRAA 57 (L) 11/01/2018 1156   Lab Results  Component Value Date   HGBA1C 6.2 (H) 12/29/2023   HGBA1C 5.8 10/16/2006   Lab Results  Component Value Date   INSULIN  16.5 04/07/2024   Lab Results  Component  Value Date   TSH 0.985 04/11/2024   CBC    Component Value Date/Time   WBC 7.9 04/11/2024 1453   WBC 6.7 11/28/2023 2025   RBC 4.42 04/11/2024 1453   RBC 4.34 11/28/2023 2025   HGB 14.2 04/11/2024 1453   HCT 42.7 04/20/2024 1457   PLT 294 04/11/2024 1453   MCV 98 (H) 04/11/2024 1453   MCV 92 06/15/2014 2324   MCH 32.1 04/11/2024 1453   MCH 30.6 11/28/2023 2025   MCHC 32.9 04/11/2024 1453   MCHC 33.3 11/28/2023 2025   RDW 13.2 04/11/2024 1453   RDW 13.0 06/15/2014 2324   Iron Studies No results found for: IRON, TIBC, FERRITIN, IRONPCTSAT Lipid Panel     Component Value Date/Time   CHOL 197 12/29/2023 1150   TRIG 104 12/29/2023 1150   HDL 61 12/29/2023 1150   CHOLHDL 3.0 01/12/2019 1327   CHOLHDL 4.3 11/01/2018 1156   VLDL 12.8 02/13/2012 1039   LDLCALC 117 (H) 12/29/2023 1150   LDLCALC 162 (H) 11/01/2018 1156   LDLDIRECT 178.3 02/13/2012 1039   Hepatic Function Panel     Component Value Date/Time   PROT 6.9 04/11/2024  1453   ALBUMIN  4.5 04/11/2024 1453   AST 25 04/11/2024 1453   ALT 20 04/11/2024 1453   ALKPHOS 83 04/11/2024 1453   BILITOT 0.6 04/11/2024 1453   BILIDIR 0.1 02/13/2012 1039      Component Value Date/Time   TSH 0.985 04/11/2024 1453   Nutritional Lab Results  Component Value Date   VD25OH 41.4 04/11/2024   VD25OH 31.8 12/29/2023   VD25OH 40.4 06/30/2023    Medications: Outpatient Encounter Medications as of 05/17/2024  Medication Sig   albuterol  (VENTOLIN  HFA) 108 (90 Base) MCG/ACT inhaler Inhale 2 puffs into the lungs every 6 (six) hours as needed for wheezing or shortness of breath.   atorvastatin  (LIPITOR) 40 MG tablet Take 1 tablet (40 mg total) by mouth at bedtime.   budesonide -formoterol  (SYMBICORT ) 160-4.5 MCG/ACT inhaler Inhale 2 puffs into the lungs in the morning and at bedtime.   buPROPion  (WELLBUTRIN  XL) 150 MG 24 hr tablet Take 1 tablet (150 mg total) by mouth daily. To be taken with Wellbutrin  300mg  to make 450mg .    buPROPion  (WELLBUTRIN  XL) 300 MG 24 hr tablet Take 1 tablet (300 mg total) by mouth daily. To be taken with Wellbutrin  300mg  to make 450mg .   cyanocobalamin  (VITAMIN B12) 1000 MCG tablet Take 1 tablet (1,000 mcg total) by mouth once a week.   furosemide  (LASIX ) 20 MG tablet TAKE 1 TABLET BY MOUTH EVERY DAY AS NEEDED   gabapentin  (NEURONTIN ) 600 MG tablet Take 1 tablet (600 mg total) by mouth 3 (three) times daily. (Patient taking differently: Take 400 mg by mouth daily at 12 noon.)   guselkumab  (TREMFYA ) 100 MG/ML pen Inject 100 mg into the skin every 8 weeks   rizatriptan  (MAXALT ) 10 MG tablet Take 1 tablet (10 mg total) by mouth as needed for migraine. May repeat in 2 hours if needed   semaglutide -weight management (WEGOVY ) 0.25 MG/0.5ML SOAJ SQ injection Inject 0.25 mg into the skin once a week.   traZODone  (DESYREL ) 100 MG tablet TAKE 2 TABLETS BY MOUTH AT BEDTIME   [DISCONTINUED] metFORMIN  (GLUCOPHAGE -XR) 500 MG 24 hr tablet TAKE 1 TABLET BY MOUTH EVERY DAY WITH BREAKFAST   sertraline  (ZOLOFT ) 100 MG tablet Take 1.5 tablets (150 mg total) by mouth daily. (Patient not taking: Reported on 05/17/2024)   No facility-administered encounter medications on file as of 05/17/2024.     Follow-Up   Return in about 4 weeks (around 06/14/2024) for For Weight Mangement with Dr. Francyne.SABRA She was informed of the importance of frequent follow up visits to maximize her success with intensive lifestyle modifications for her multiple health conditions.  Attestation Statement   Reviewed by clinician on day of visit: allergies, medications, problem list, medical history, surgical history, family history, social history, and previous encounter notes.   I have spent 43 minutes in the care of the patient today including: 3 minutes before the visit reviewing and preparing the chart. 33 minutes face-to-face assessing and reviewing listed medical problems as outlined in obesity care plan, providing nutritional and  behavioral counseling on topics outlined in the obesity care plan, counseling regarding anti-obesity medication as outlined in obesity care plan, independently interpreting test results and goals of care, as described in assessment and plan, reviewing and discussing biometric information and progress, and ordering medications - see orders 7 minutes after the visit updating chart and documentation of encounter.    Lucas Francyne, MD

## 2024-05-17 NOTE — Assessment & Plan Note (Signed)
 Most recent A1c is  Lab Results  Component Value Date   HGBA1C 6.2 (H) 12/29/2023   HGBA1C 5.8 10/16/2006    Patient aware of disease state and risk of progression. This may contribute to abnormal cravings, fatigue and diabetic complications without having diabetes.   We have discussed treatment options which include: losing 7 to 10% of body weight, increasing physical activity to a goal of 150 minutes a week at moderate intensity.  Advised to maintain a diet low on simple and processed carbohydrates.  She is currently on metformin  XR 500 mg once a day.  I feel that considering degree of obesity as well as high risk comorbidities she would benefit from GLP-1 aided weight management.

## 2024-05-17 NOTE — Assessment & Plan Note (Signed)
 I reviewed about 2 years worth of GFR's.  Her blood pressure is well-controlled.  Hydration has improved.  She has prediabetes.  Has been counseled on avoiding nephrotoxins maintaining adequate hydration.  She may also benefit from GLP-1 as medication has been shown to be beneficial for patients with chronic kidney disease and delaying progression.

## 2024-05-17 NOTE — Assessment & Plan Note (Signed)
 See obesity treatment plan. Continue 1200-calorie reduced calorie nutrition plan Maintain protein intake to 75 g/day; she has chronic kidney disease Start GLP-1, Wegovy  0.25 mg once a week.  See above for indications

## 2024-05-17 NOTE — Assessment & Plan Note (Signed)
 Reviewed recent labs she had a normal methylmalonic acid and RBC folate.  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  No further workup necessary.  Denies alcohol consumption.

## 2024-05-24 ENCOUNTER — Ambulatory Visit (INDEPENDENT_AMBULATORY_CARE_PROVIDER_SITE_OTHER)

## 2024-05-24 ENCOUNTER — Encounter: Payer: Self-pay | Admitting: Cardiology

## 2024-05-24 ENCOUNTER — Ambulatory Visit: Attending: Cardiology | Admitting: Cardiology

## 2024-05-24 ENCOUNTER — Telehealth (INDEPENDENT_AMBULATORY_CARE_PROVIDER_SITE_OTHER): Payer: Self-pay

## 2024-05-24 VITALS — BP 116/70 | HR 79 | Ht 65.0 in | Wt 217.0 lb

## 2024-05-24 DIAGNOSIS — Z950 Presence of cardiac pacemaker: Secondary | ICD-10-CM | POA: Diagnosis not present

## 2024-05-24 DIAGNOSIS — R001 Bradycardia, unspecified: Secondary | ICD-10-CM

## 2024-05-24 DIAGNOSIS — R42 Dizziness and giddiness: Secondary | ICD-10-CM

## 2024-05-24 NOTE — Telephone Encounter (Signed)
 Emma Rogers (Key: [id number]) Rx #: [id number] Wegovy  0.25MG /0.5ML auto-injectors

## 2024-05-24 NOTE — Progress Notes (Unsigned)
 Enrolled for [employer] to mail a ZIO XT long term holter monitor to the patients address on file.

## 2024-05-24 NOTE — Patient Instructions (Addendum)
 Medication Instructions:  Your physician recommends that you continue on your current medications as directed. Please refer to the Current Medication list given to you today.  *If you need a refill on your cardiac medications before your next appointment, please call your pharmacy*  Lab Work: None ordered   Testing/Procedures:                           ZIO XT- Long Term Monitor Instructions  Your physician has requested you wear a ZIO patch monitor for 14 days.  This is a single patch monitor. [employer] supplies one patch monitor per enrollment. Additional stickers are not available. Please do not apply patch if you will be having a Nuclear Stress Test,  Echocardiogram, Cardiac CT, MRI, or Chest Xray during the period you would be wearing the  monitor. The patch cannot be worn during these tests. You cannot remove and re-apply the  ZIO XT patch monitor.  Your ZIO patch monitor will be mailed 3 day USPS to your address on file. It may take 3-5 days  to receive your monitor after you have been enrolled.  Once you have received your monitor, please review the enclosed instructions. Your monitor  has already been registered assigning a specific monitor serial # to you.  Billing and Patient Assistance Program Information  We have supplied [employer] with any of your insurance information on file for billing purposes. [employer] offers a sliding scale Patient Assistance Program for patients that do not have  insurance, or whose insurance does not completely cover the cost of the ZIO monitor.  You must apply for the Patient Assistance Program to qualify for this discounted rate.  To apply, please call [employer] at [phone], select option 4, select option 2, ask to apply for  Patient Assistance Program. Meredeth will ask your household income, and how many people  are in your household. They will quote your out-of-pocket cost based on that information.  [employer] will also be able to set up a  23-month, interest-free payment plan if needed.  Applying the monitor   Shave hair from upper left chest.  Hold abrader disc by orange tab. Rub abrader in 40 strokes over the upper left chest as  indicated in your monitor instructions.  Clean area with 4 enclosed alcohol pads. Let dry.  Apply patch as indicated in monitor instructions. Patch will be placed under collarbone on left  side of chest with arrow pointing upward.  Rub patch adhesive wings for 2 minutes. Remove white label marked 1. Remove the white  label marked 2. Rub patch adhesive wings for 2 additional minutes.  While looking in a mirror, press and release button in center of patch. A small green light will  flash 3-4 times. This will be your only indicator that the monitor has been turned on.  Do not shower for the first 24 hours. You may shower after the first 24 hours.  Press the button if you feel a symptom. You will hear a small click. Record Date, Time and  Symptom in the Patient Logbook.  When you are ready to remove the patch, follow instructions on the last 2 pages of Patient  Logbook. Stick patch monitor onto the last page of Patient Logbook.  Place Patient Logbook in the blue and white box. Use locking tab on box and tape box closed  securely. The blue and white box has prepaid postage on it. Please place it in the mailbox  as  soon as possible. Your physician should have your test results approximately 7 days after the  monitor has been mailed back to [facility].  Call [facility] Customer Care at [phone] if you have questions regarding  your ZIO XT patch monitor. Call them immediately if you see an orange light blinking on your  monitor.  If your monitor falls off in less than 4 days, contact our Monitor department at [phone].  If your monitor becomes loose or falls off after 4 days call [employer] at [phone] for  suggestions on securing your monitor   Follow-Up: At [facility], you and your health needs are our priority.  As part of our continuing mission to provide you with exceptional heart care, our providers are all part of one team.  This team includes your primary Cardiologist (physician) and Advanced Practice Providers or APPs (Physician Assistants and Nurse Practitioners) who all work together to provide you with the care you need, when you need it.  Your next appointment:   1 year(s)  Provider:   You will see one of the following Advanced Practice Providers on your designated Care Team:   Charlies Arthur, PA-C Michael Andy Tillery, PA-C Suzann Riddle, NP Daphne Barrack, NP   Thank you for choosing [facility]!!   Maeola Domino, RN [phone]   Other Instructions

## 2024-05-24 NOTE — Progress Notes (Signed)
  Electrophysiology Office Note:   Date:  05/24/2024  ID:  Crucita Lacorte, DOB 1965-11-16, MRN [id number]  Primary Cardiologist: None Primary Heart Failure: Morene JINNY Brownie, MD Electrophysiologist: Miken Stecher Gladis Norton, MD      History of Present Illness:   Emma Rogers is a 58 y.o. female with h/o depression, CKD stage III, symptomatic bradycardia seen today for routine electrophysiology followup.   Since last being seen in our clinic the patient reports doing overall well.  For the most part she has.  She does have some episodes of dizziness that occur a few times a month.  They mainly occur when she is sitting down.  She feels lightheaded when they occur.  They last for up to 30 seconds.  Aside from that, she is able to do her daily activities without restriction.  she denies chest pain, palpitations, dyspnea, PND, orthopnea, nausea, vomiting, dizziness, syncope, edema, weight gain, or early satiety.   Review of systems complete and found to be negative unless listed in HPI.      EP Information / Studies Reviewed:    EKG is ordered today. Personal review as below.  EKG Interpretation Date/Time:  Tuesday May 24 2024 15:02:31 EDT Ventricular Rate:  79 PR Interval:  130 QRS Duration:  80 QT Interval:  376 QTC Calculation: 431 R Axis:   -13  Text Interpretation: Normal sinus rhythm Normal ECG When compared with ECG of 08-Jan-2021 11:44, Nonspecific T wave abnormality now evident in Inferior leads Confirmed by Keltin Baird ([id number]) on 05/24/2024 3:03:44 PM   PPM Interrogation-  reviewed in detail today,  See PACEART report.  Device History: Medtronic Dual Chamber PPM implanted 04/28/1995 for Symptomatic bradycardia  Risk Assessment/Calculations:            Physical Exam:   VS:  BP 116/70 (BP Location: Left Arm, Patient Position: Sitting, Cuff Size: Large)   Pulse 79   Ht 5' 5 (1.651 m)   Wt 217 lb (98.4 kg)   SpO2 95%   BMI 36.11 kg/m    Wt Readings from Last 3  Encounters:  05/24/24 217 lb (98.4 kg)  05/17/24 217 lb (98.4 kg)  04/20/24 217 lb (98.4 kg)     GEN: Well nourished, well developed in no acute distress NECK: No JVD; No carotid bruits CARDIAC: Regular rate and rhythm, no murmurs, rubs, gallops RESPIRATORY:  Clear to auscultation without rales, wheezing or rhonchi  ABDOMEN: Soft, non-tender, non-distended EXTREMITIES:  No edema; No deformity   ASSESSMENT AND PLAN:    Symptomatic bradycardia s/p Medtronic PPM  Normal PPM function See Pace Art report Thresholds elevated on atrial and ventricular leads.  Have increased output. At the time of her generator change, may need to have lead extraction versus lead revision.  Additionally, if she does not pace with a base rate of 50, may be able to abandon the system.  Dizziness: Kamylle Axelson have her wear a 2-week monitor.  Disposition:   Follow up with EP APP in 12 months  Signed, Cayli Escajeda Gladis Norton, MD

## 2024-05-25 LAB — CUP PACEART INCLINIC DEVICE CHECK
Battery Impedance: 2527 Ohm
Battery Remaining Longevity: 22 mo
Battery Voltage: 2.72 V
Brady Statistic AP VP Percent: 0 %
Brady Statistic AP VS Percent: 30 %
Brady Statistic AS VP Percent: 0 %
Brady Statistic AS VS Percent: 70 %
Date Time Interrogation Session: 20250902151000
Implantable Lead Connection Status: 753985
Implantable Lead Connection Status: 753985
Implantable Lead Implant Date: 19960806
Implantable Lead Implant Date: 19960806
Implantable Lead Location: 753859
Implantable Lead Location: 753860
Implantable Lead Model: 4524
Implantable Lead Model: 5034
Implantable Pulse Generator Implant Date: 20110328
Lead Channel Impedance Value: 1082 Ohm
Lead Channel Impedance Value: 306 Ohm
Lead Channel Pacing Threshold Amplitude: 1 V
Lead Channel Pacing Threshold Amplitude: 1.625 V
Lead Channel Pacing Threshold Amplitude: 2 V
Lead Channel Pacing Threshold Amplitude: 2 V
Lead Channel Pacing Threshold Amplitude: 2 V
Lead Channel Pacing Threshold Pulse Width: 0.4 ms
Lead Channel Pacing Threshold Pulse Width: 0.4 ms
Lead Channel Pacing Threshold Pulse Width: 1 ms
Lead Channel Pacing Threshold Pulse Width: 1 ms
Lead Channel Pacing Threshold Pulse Width: 1 ms
Lead Channel Sensing Intrinsic Amplitude: 2 mV
Lead Channel Sensing Intrinsic Amplitude: 4 mV
Lead Channel Setting Pacing Amplitude: 4 V
Lead Channel Setting Pacing Amplitude: 4 V
Lead Channel Setting Pacing Pulse Width: 1 ms
Lead Channel Setting Sensing Sensitivity: 2 mV
Zone Setting Status: 755011
Zone Setting Status: 755011

## 2024-05-26 ENCOUNTER — Ambulatory Visit: Payer: Self-pay | Admitting: Cardiology

## 2024-05-30 ENCOUNTER — Ambulatory Visit: Admitting: Family Medicine

## 2024-05-30 ENCOUNTER — Ambulatory Visit: Admitting: Pulmonary Disease

## 2024-05-30 ENCOUNTER — Encounter: Payer: Self-pay | Admitting: Family Medicine

## 2024-05-30 ENCOUNTER — Encounter

## 2024-05-30 VITALS — BP 115/83 | HR 67 | Temp 97.9°F | Ht 65.0 in | Wt 220.8 lb

## 2024-05-30 DIAGNOSIS — F331 Major depressive disorder, recurrent, moderate: Secondary | ICD-10-CM | POA: Diagnosis not present

## 2024-05-30 MED ORDER — FLUOXETINE HCL 10 MG PO CAPS: ORAL_CAPSULE | ORAL | 3 refills | Status: DC

## 2024-05-30 NOTE — Assessment & Plan Note (Signed)
 Not doing well off her sertraline . Will change her to prozac . Recheck in about a month.

## 2024-05-30 NOTE — Progress Notes (Signed)
 BP 115/83 (BP Location: Left Arm, Patient Position: Sitting, Cuff Size: Large)   Pulse 67   Temp 97.9 F (36.6 C)   Ht 5' 5 (1.651 m)   Wt 220 lb 12.8 oz (100.2 kg)   SpO2 98%   BMI 36.74 kg/m    Subjective:    Patient ID: Emma Rogers, female    DOB: 1966/06/20, 58 y.o.   MRN: [id number]  HPI: Emma Rogers is a 58 y.o. female  Chief Complaint  Patient presents with   Medication Problem    Patient wants to discuss her medication that doesn't work well with her weight loss journey   DEPRESSION Mood status: worse Satisfied with current treatment?: no Symptom severity: moderate  Duration of current treatment : chronic- but came off her sertraline  and started getting worse Side effects: yes- headaches and insomnia Medication compliance: has been off sertraline  for a couple of weeks Psychotherapy/counseling: yes in the past Previous psychiatric medications: wellbutrin , Tried sertraline  on a long time, cymbalta , celexa , and lexapro  for short periods Depressed mood: yes Anxious mood: yes Anhedonia: no Significant weight loss or gain: no Insomnia: no  Fatigue: yes Feelings of worthlessness or guilt: no Impaired concentration/indecisiveness: no Suicidal ideations: no Hopelessness: no Crying spells: no    05/30/2024    3:22 PM 04/07/2024    9:32 AM 03/22/2024    3:56 PM 03/07/2024   11:20 AM 02/24/2024    3:05 PM  Depression screen PHQ 2/9  Decreased Interest 0 0 0 1 1  Down, Depressed, Hopeless 1 0 0 1 2  PHQ - 2 Score 1 0 0 2 3  Altered sleeping 0 3 3 1 3   Tired, decreased energy 2 2 3 1 1   Change in appetite 0 2 0 0 0  Feeling bad or failure about yourself  0 0 0 0 1  Trouble concentrating 2 1 1 1  0  Moving slowly or fidgety/restless 0 0 0 0 0  Suicidal thoughts 0 0 0 0 0  PHQ-9 Score 5 8 7 5 8   Difficult doing work/chores  Somewhat difficult Somewhat difficult Somewhat difficult Somewhat difficult     Relevant past medical, surgical, family and social history  reviewed and updated as indicated. Interim medical history since our last visit reviewed. Allergies and medications reviewed and updated.  Review of Systems  Constitutional: Negative.   Respiratory: Negative.    Cardiovascular: Negative.   Musculoskeletal: Negative.   Skin: Negative.   Psychiatric/Behavioral:  Positive for dysphoric mood. Negative for agitation, behavioral problems, confusion, decreased concentration, hallucinations, self-injury, sleep disturbance and suicidal ideas. The patient is nervous/anxious. The patient is not hyperactive.     Per HPI unless specifically indicated above     Objective:    BP 115/83 (BP Location: Left Arm, Patient Position: Sitting, Cuff Size: Large)   Pulse 67   Temp 97.9 F (36.6 C)   Ht 5' 5 (1.651 m)   Wt 220 lb 12.8 oz (100.2 kg)   SpO2 98%   BMI 36.74 kg/m   Wt Readings from Last 3 Encounters:  05/30/24 220 lb 12.8 oz (100.2 kg)  05/24/24 217 lb (98.4 kg)  05/17/24 217 lb (98.4 kg)    Physical Exam Vitals and nursing note reviewed.  Constitutional:      General: She is not in acute distress.    Appearance: Normal appearance. She is not ill-appearing, toxic-appearing or diaphoretic.  HENT:     Head: Normocephalic and atraumatic.     Right Ear:  External ear normal.     Left Ear: External ear normal.     Nose: Nose normal.     Mouth/Throat:     Mouth: Mucous membranes are moist.     Pharynx: Oropharynx is clear.  Eyes:     General: No scleral icterus.       Right eye: No discharge.        Left eye: No discharge.     Extraocular Movements: Extraocular movements intact.     Conjunctiva/sclera: Conjunctivae normal.     Pupils: Pupils are equal, round, and reactive to light.  Cardiovascular:     Rate and Rhythm: Normal rate and regular rhythm.     Pulses: Normal pulses.     Heart sounds: Normal heart sounds. No murmur heard.    No friction rub. No gallop.  Pulmonary:     Effort: Pulmonary effort is normal. No respiratory  distress.     Breath sounds: Normal breath sounds. No stridor. No wheezing, rhonchi or rales.  Chest:     Chest wall: No tenderness.  Musculoskeletal:        General: Normal range of motion.     Cervical back: Normal range of motion and neck supple.  Skin:    General: Skin is warm and dry.     Capillary Refill: Capillary refill takes less than 2 seconds.     Coloration: Skin is not jaundiced or pale.     Findings: No bruising, erythema, lesion or rash.  Neurological:     General: No focal deficit present.     Mental Status: She is alert and oriented to person, place, and time. Mental status is at baseline.  Psychiatric:        Mood and Affect: Mood normal.        Behavior: Behavior normal.        Thought Content: Thought content normal.        Judgment: Judgment normal.     Results for orders placed or performed in visit on 05/24/24  CUP PACEART [facility] DEVICE CHECK   Collection Time: 05/24/24  3:10 PM  Result Value Ref Range   Date Time Interrogation Session [id number]    Pulse Generator Manufacturer MERM    Pulse Gen Model ADDRL1 Adapta    Pulse Gen Serial Number [id number]    Clinic Name [facility]    Implantable Pulse Generator Type Implantable Pulse Generator    Implantable Pulse Generator Implant Date 79889671    Implantable Lead Manufacturer MERM    Implantable Lead Model 4524 CapSure SP    Implantable Lead Serial Number [id number]    Implantable Lead Implant Date 80039193    Implantable Lead Location Detail 1 APPENDAGE    Implantable Lead Location [id number]    Implantable Lead Connection Status [id number]    Implantable Lead Manufacturer MERM    Implantable Lead Model [id number] CapSure Z    Implantable Lead Serial Number [id number]    Implantable Lead Implant Date 80039193    Implantable Lead Location Detail 1 APEX    Implantable Lead Location [id number]    Implantable Lead Connection Status [id number]    Lead Channel Setting Sensing Sensitivity 2.00 mV   Lead  Channel Setting Pacing Amplitude 4.000 V   Lead Channel Setting Pacing Pulse Width 1.00 ms   Lead Channel Setting Pacing Amplitude 4.000 V   Zone Setting Status 755011    Zone Setting Status 755011    Lead Channel Impedance Value 306 ohm   Lead Channel  Sensing Intrinsic Amplitude 2.00 mV   Lead Channel Pacing Threshold Amplitude 1.000 V   Lead Channel Pacing Threshold Pulse Width 1.00 ms   Lead Channel Pacing Threshold Amplitude 1.625 V   Lead Channel Pacing Threshold Pulse Width 0.40 ms   Lead Channel Pacing Threshold Amplitude 2.0 V   Lead Channel Pacing Threshold Pulse Width 1.0 ms   Lead Channel Impedance Value 1,082 ohm   Lead Channel Sensing Intrinsic Amplitude 4.00 mV   Lead Channel Pacing Threshold Amplitude 2.000 V   Lead Channel Pacing Threshold Pulse Width 1.00 ms   Lead Channel Pacing Threshold Amplitude 2.000 V   Lead Channel Pacing Threshold Pulse Width 0.40 ms   Battery Status OK    Battery Remaining Longevity 22 mo   Battery Voltage 2.72 V   Battery Impedance 2,527 ohm   Brady Statistic AP VP Percent 0 %   Brady Statistic AS VP Percent 0 %   Brady Statistic AP VS Percent 30 %   Brady Statistic AS VS Percent 70 %   Eval Rhythm AS/VS 77       Assessment & Plan:   Problem List Items Addressed This Visit       Other   MDD (major depressive disorder), recurrent episode, moderate (HCC) - Primary   Not doing well off her sertraline . Will change her to prozac . Recheck in about a month.       Relevant Medications   FLUoxetine  (PROZAC ) 10 MG capsule     Follow up plan: Return in about 4 weeks (around 06/27/2024) for physical.

## 2024-05-31 ENCOUNTER — Encounter (INDEPENDENT_AMBULATORY_CARE_PROVIDER_SITE_OTHER): Payer: Self-pay

## 2024-06-09 ENCOUNTER — Ambulatory Visit: Payer: Self-pay

## 2024-06-09 NOTE — Telephone Encounter (Signed)
 FYI Only or Action Required?: FYI only for provider.  Patient was last seen in primary care on 05/30/2024 by Vicci Duwaine SQUIBB, DO.  Called Nurse Triage reporting Urinary Urgency and Urinary Frequency.  Symptoms began 2 days ago.  Interventions attempted: Rest, hydration, or home remedies.  Symptoms are: unchanged.  Triage Disposition: See Physician Within 24 Hours  Patient/caregiver understands and will follow disposition?: Yes  Copied from CRM #[id number]. Topic: Clinical - Red Word Triage >> Jun 09, 2024  1:18 PM Emma Rogers wrote: Pt thinks she has a UTI - pain in lower belly groin area, feeling like she has to go use the bathroom, no pain when urinating. Has been going on a couple of days. Reason for Disposition  Urinating more frequently than usual (i.e., frequency) OR new-onset of the feeling of an urgent need to urinate (i.e., urgency)  Answer Assessment - Initial Assessment Questions 1. SYMPTOM: What's the main symptom you're concerned about? (e.g., frequency, incontinence)     Urinary urgency and frequency 2. ONSET: When did the  urinary urgency and frequency  start?     Started a couple of days ago 3. PAIN: Is there any pain? If Yes, ask: How bad is it? (Scale: 1-10; mild, moderate, severe)     Yes-4 out of 10 4. CAUSE: What do you think is causing the symptoms?     Possible UTI 5. OTHER SYMPTOMS: Do you have any other symptoms? (e.g., blood in urine, fever, flank pain, pain with urination)     Back pain, darker urine but hasn't seen any blood.  Protocols used: Urinary Symptoms-A-AH

## 2024-06-10 ENCOUNTER — Ambulatory Visit: Admitting: Family Medicine

## 2024-06-14 ENCOUNTER — Ambulatory Visit (INDEPENDENT_AMBULATORY_CARE_PROVIDER_SITE_OTHER): Admitting: Internal Medicine

## 2024-06-14 DIAGNOSIS — M5440 Lumbago with sciatica, unspecified side: Secondary | ICD-10-CM | POA: Diagnosis not present

## 2024-06-14 DIAGNOSIS — M5416 Radiculopathy, lumbar region: Secondary | ICD-10-CM | POA: Diagnosis not present

## 2024-06-20 ENCOUNTER — Ambulatory Visit (INDEPENDENT_AMBULATORY_CARE_PROVIDER_SITE_OTHER): Payer: Commercial Managed Care - PPO

## 2024-06-20 DIAGNOSIS — R001 Bradycardia, unspecified: Secondary | ICD-10-CM

## 2024-06-20 NOTE — Patient Instructions (Incomplete)

## 2024-06-22 ENCOUNTER — Ambulatory Visit: Admitting: Nurse Practitioner

## 2024-06-22 ENCOUNTER — Other Ambulatory Visit: Payer: Self-pay | Admitting: Family Medicine

## 2024-06-22 ENCOUNTER — Ambulatory Visit: Payer: Self-pay | Admitting: Cardiology

## 2024-06-22 DIAGNOSIS — R42 Dizziness and giddiness: Secondary | ICD-10-CM

## 2024-06-22 LAB — CUP PACEART REMOTE DEVICE CHECK
Battery Impedance: 2568 Ohm
Battery Remaining Longevity: 29 mo
Battery Voltage: 2.73 V
Brady Statistic AP VP Percent: 0 %
Brady Statistic AP VS Percent: 2 %
Brady Statistic AS VP Percent: 0 %
Brady Statistic AS VS Percent: 98 %
Date Time Interrogation Session: 20250930135652
Implantable Lead Connection Status: 753985
Implantable Lead Connection Status: 753985
Implantable Lead Implant Date: 19960806
Implantable Lead Implant Date: 19960806
Implantable Lead Location: 753859
Implantable Lead Location: 753860
Implantable Lead Model: 4524
Implantable Lead Model: 5034
Implantable Pulse Generator Implant Date: 20110328
Lead Channel Impedance Value: 1173 Ohm
Lead Channel Impedance Value: 295 Ohm
Lead Channel Pacing Threshold Amplitude: 1.5 V
Lead Channel Pacing Threshold Amplitude: 1.75 V
Lead Channel Pacing Threshold Pulse Width: 0.4 ms
Lead Channel Pacing Threshold Pulse Width: 0.4 ms
Lead Channel Setting Pacing Amplitude: 3 V
Lead Channel Setting Pacing Amplitude: 3.5 V
Lead Channel Setting Pacing Pulse Width: 0.4 ms
Lead Channel Setting Sensing Sensitivity: 2 mV
Zone Setting Status: 755011
Zone Setting Status: 755011

## 2024-06-23 ENCOUNTER — Ambulatory Visit (INDEPENDENT_AMBULATORY_CARE_PROVIDER_SITE_OTHER): Admitting: Internal Medicine

## 2024-06-23 NOTE — Telephone Encounter (Signed)
 Requested Prescriptions  Refused Prescriptions Disp Refills   FLUoxetine  (PROZAC ) 10 MG capsule [Pharmacy Med Name: FLUOXETINE  HCL 10 MG CAPSULE] 90 capsule 2    Sig: TAKE 1 TABLET BY MOUTH EVERY OTHER DAY FOR 2 WEEKS, THEN INCREASE TO 1 TABLET DAILY     Psychiatry:  Antidepressants - SSRI Passed - 06/23/2024  3:23 PM      Passed - Completed PHQ-2 or PHQ-9 in the last 360 days      Passed - Valid encounter within last 6 months    Recent Outpatient Visits           3 weeks ago MDD (major depressive disorder), recurrent episode, moderate (HCC)   [place] [facility] [place], Megan P, DO   3 months ago MDD (major depressive disorder), recurrent episode, moderate (HCC)   [facility] [place], Megan P, DO   3 months ago Herpes zoster without complication   [place] [facility] Melvin Pao, NP   4 months ago Herpes zoster without complication   [place] [facility] Melvin Pao, NP   4 months ago MDD (major depressive disorder), recurrent episode, moderate (HCC)   [place] [facility] [place], [place], DO

## 2024-06-23 NOTE — Progress Notes (Signed)
 Remote PPM Transmission

## 2024-06-30 ENCOUNTER — Ambulatory Visit: Admitting: Nurse Practitioner

## 2024-06-30 ENCOUNTER — Encounter: Payer: Self-pay | Admitting: Nurse Practitioner

## 2024-06-30 ENCOUNTER — Other Ambulatory Visit: Payer: Self-pay | Admitting: Family Medicine

## 2024-06-30 VITALS — BP 117/65 | HR 73 | Temp 98.2°F | Ht 64.9 in | Wt 220.0 lb

## 2024-06-30 DIAGNOSIS — M778 Other enthesopathies, not elsewhere classified: Secondary | ICD-10-CM

## 2024-06-30 MED ORDER — METHYLPREDNISOLONE 4 MG PO TBPK: ORAL_TABLET | ORAL | 0 refills | Status: DC

## 2024-06-30 NOTE — Progress Notes (Signed)
 BP 117/65   Pulse 73   Temp 98.2 F (36.8 C) (Oral)   Ht 5' 4.9 (1.648 m)   Wt 220 lb (99.8 kg)   SpO2 98%   BMI 36.72 kg/m    Subjective:    Patient ID: Emma Rogers, female    DOB: 29-Jul-1966, 58 y.o.   MRN: [id number]  HPI: Emma Rogers is a 58 y.o. female  Chief Complaint  Patient presents with   Arm Pain    Patient states she has been having L forearm pain since June, states pain has gradually gotten worse since then. States she feels pain more when trying to lift something and it waking her up during the night. Also states she has also felt a couple of knots in the forearm. States they do not hurt.    ARM PAIN Duration: months Location: left Mechanism of injury: unknown Onset: gradual Severity: 3/10  Quality:  aching Frequency: intermittent Radiation: no Aggravating factors: movement  Alleviating factors: nothing Status: worse Treatments attempted: heat and ibuprofen   Relief with NSAIDs?:  no Swelling: yes Redness: no  Warmth: no Trauma: no Chest pain: no  Shortness of breath: no  Fever: no Decreased sensation: no Paresthesias: no Weakness: no  Relevant past medical, surgical, family and social history reviewed and updated as indicated. Interim medical history since our last visit reviewed. Allergies and medications reviewed and updated.  Review of Systems  Musculoskeletal:        Left forearm pain    Per HPI unless specifically indicated above     Objective:    BP 117/65   Pulse 73   Temp 98.2 F (36.8 C) (Oral)   Ht 5' 4.9 (1.648 m)   Wt 220 lb (99.8 kg)   SpO2 98%   BMI 36.72 kg/m   Wt Readings from Last 3 Encounters:  06/30/24 220 lb (99.8 kg)  05/30/24 220 lb 12.8 oz (100.2 kg)  05/24/24 217 lb (98.4 kg)    Physical Exam Vitals and nursing note reviewed.  Constitutional:      General: She is not in acute distress.    Appearance: Normal appearance. She is normal weight. She is not ill-appearing, toxic-appearing or  diaphoretic.  HENT:     Head: Normocephalic.     Right Ear: External ear normal.     Left Ear: External ear normal.     Nose: Nose normal.     Mouth/Throat:     Mouth: Mucous membranes are moist.     Pharynx: Oropharynx is clear.  Eyes:     General:        Right eye: No discharge.        Left eye: No discharge.     Extraocular Movements: Extraocular movements intact.     Conjunctiva/sclera: Conjunctivae normal.     Pupils: Pupils are equal, round, and reactive to light.  Cardiovascular:     Rate and Rhythm: Normal rate and regular rhythm.     Heart sounds: No murmur heard. Pulmonary:     Effort: Pulmonary effort is normal. No respiratory distress.     Breath sounds: Normal breath sounds. No wheezing or rales.  Musculoskeletal:     Left elbow: No swelling, deformity, effusion or lacerations. Normal range of motion. Tenderness present in lateral epicondyle.     Cervical back: Normal range of motion and neck supple.  Skin:    General: Skin is warm and dry.     Capillary Refill: Capillary refill takes less than 2  seconds.  Neurological:     General: No focal deficit present.     Mental Status: She is alert and oriented to person, place, and time. Mental status is at baseline.  Psychiatric:        Mood and Affect: Mood normal.        Behavior: Behavior normal.        Thought Content: Thought content normal.        Judgment: Judgment normal.     Results for orders placed or performed in visit on 06/20/24  CUP PACEART REMOTE DEVICE CHECK   Collection Time: 06/21/24  1:56 PM  Result Value Ref Range   Date Time Interrogation Session [id number]    Pulse Generator Manufacturer MERM    Pulse Gen Model ADDRL1 Adapta    Pulse Gen Serial Number [id number]    Clinic Name [facility]    Implantable Pulse Generator Type Implantable Pulse Generator    Implantable Pulse Generator Implant Date 79889671    Implantable Lead Manufacturer MERM    Implantable Lead Model 4524 CapSure  SP    Implantable Lead Serial Number [id number]    Implantable Lead Implant Date 80039193    Implantable Lead Location Detail 1 APPENDAGE    Implantable Lead Location [id number]    Implantable Lead Connection Status [id number]    Implantable Lead Manufacturer MERM    Implantable Lead Model [id number] CapSure Z    Implantable Lead Serial Number [id number]    Implantable Lead Implant Date 80039193    Implantable Lead Location Detail 1 APEX    Implantable Lead Location [id number]    Implantable Lead Connection Status [id number]    Lead Channel Setting Sensing Sensitivity 2.00 mV   Lead Channel Setting Pacing Amplitude 3.000 V   Lead Channel Setting Pacing Pulse Width 0.40 ms   Lead Channel Setting Pacing Amplitude 3.500 V   Zone Setting Status 755011    Zone Setting Status 755011    Lead Channel Impedance Value 295 ohm   Lead Channel Pacing Threshold Amplitude 1.500 V   Lead Channel Pacing Threshold Pulse Width 0.40 ms   Lead Channel Impedance Value 1,173 ohm   Lead Channel Pacing Threshold Amplitude 1.750 V   Lead Channel Pacing Threshold Pulse Width 0.40 ms   Battery Status OK    Battery Remaining Longevity 29 mo   Battery Voltage 2.73 V   Battery Impedance 2,568 ohm   Brady Statistic AP VP Percent 0 %   Brady Statistic AS VP Percent 0 %   Brady Statistic AP VS Percent 2 %   Brady Statistic AS VS Percent 98 %   *Note: Due to a large number of results and/or encounters for the requested time period, some results have not been displayed. A complete set of results can be found in Results Review.      Assessment & Plan:   Problem List Items Addressed This Visit   None Visit Diagnoses       Elbow tendonitis    -  Primary   Will treat with medrol  dose pak. Complete course of medication. If symptoms are not improved, return for reevaluation and possible injection.        Follow up plan: No follow-ups on file.

## 2024-07-01 NOTE — Telephone Encounter (Signed)
 Requested medications are due for refill today.  yes  Requested medications are on the active medications list.  yes  Last refill. 01/05/2024 #90 1 rf  Future visit scheduled.   yes  Notes to clinic.  Missing magnesium lab.    Requested Prescriptions  Pending Prescriptions Disp Refills   furosemide  (LASIX ) 20 MG tablet [Pharmacy Med Name: FUROSEMIDE  20 MG TABLET] 90 tablet 1    Sig: TAKE 1 TABLET BY MOUTH EVERY DAY AS NEEDED     Cardiovascular:  Diuretics - Loop Failed - 07/01/2024  2:52 PM      Failed - Cr in normal range and within 180 days    Creat  Date Value Ref Range Status  03/30/2023 0.95 0.50 - 1.03 mg/dL Final   Creatinine, Ser  Date Value Ref Range Status  04/11/2024 1.24 (H) 0.57 - 1.00 mg/dL Final         Failed - Mg Level in normal range and within 180 days    Magnesium  Date Value Ref Range Status  01/28/2023 2.2 1.6 - 2.3 mg/dL Final         Passed - K in normal range and within 180 days    Potassium  Date Value Ref Range Status  04/11/2024 4.3 3.5 - 5.2 mmol/L Final  06/15/2014 3.3 (L) 3.5 - 5.1 mmol/L Final         Passed - Ca in normal range and within 180 days    Calcium   Date Value Ref Range Status  04/11/2024 9.5 8.7 - 10.2 mg/dL Final   Calcium , Total  Date Value Ref Range Status  06/15/2014 9.2 8.5 - 10.1 mg/dL Final         Passed - Na in normal range and within 180 days    Sodium  Date Value Ref Range Status  04/11/2024 137 134 - 144 mmol/L Final  06/15/2014 135 (L) 136 - 145 mmol/L Final         Passed - Cl in normal range and within 180 days    Chloride  Date Value Ref Range Status  04/11/2024 100 96 - 106 mmol/L Final  06/15/2014 98 98 - 107 mmol/L Final         Passed - Last BP in normal range    BP Readings from Last 1 Encounters:  06/30/24 117/65         Passed - Valid encounter within last 6 months    Recent Outpatient Visits           Yesterday Elbow tendonitis   [facility]  Melvin Pao, NP   1 month ago MDD (major depressive disorder), recurrent episode, moderate (HCC)   [facility] [place], Megan P, DO   3 months ago MDD (major depressive disorder), recurrent episode, moderate (HCC)   [place] [facility] [place], Megan P, DO   3 months ago Herpes zoster without complication   [facility] Melvin Pao, NP   4 months ago Herpes zoster without complication   [facility] Melvin Pao, NP

## 2024-07-11 ENCOUNTER — Ambulatory Visit (INDEPENDENT_AMBULATORY_CARE_PROVIDER_SITE_OTHER): Admitting: Internal Medicine

## 2024-07-11 ENCOUNTER — Encounter (INDEPENDENT_AMBULATORY_CARE_PROVIDER_SITE_OTHER): Payer: Self-pay

## 2024-07-15 ENCOUNTER — Ambulatory Visit: Payer: Self-pay

## 2024-07-15 ENCOUNTER — Ambulatory Visit: Admitting: Physician Assistant

## 2024-07-15 VITALS — BP 134/70 | HR 74 | Temp 98.4°F | Ht 64.9 in | Wt 223.0 lb

## 2024-07-15 DIAGNOSIS — M5416 Radiculopathy, lumbar region: Secondary | ICD-10-CM | POA: Insufficient documentation

## 2024-07-15 DIAGNOSIS — I951 Orthostatic hypotension: Secondary | ICD-10-CM | POA: Insufficient documentation

## 2024-07-15 DIAGNOSIS — Z7960 Long term (current) use of unspecified immunomodulators and immunosuppressants: Secondary | ICD-10-CM | POA: Diagnosis not present

## 2024-07-15 DIAGNOSIS — B0230 Zoster ocular disease, unspecified: Secondary | ICD-10-CM

## 2024-07-15 DIAGNOSIS — L4050 Arthropathic psoriasis, unspecified: Secondary | ICD-10-CM | POA: Diagnosis not present

## 2024-07-15 DIAGNOSIS — M4690 Unspecified inflammatory spondylopathy, site unspecified: Secondary | ICD-10-CM | POA: Diagnosis not present

## 2024-07-15 DIAGNOSIS — L400 Psoriasis vulgaris: Secondary | ICD-10-CM | POA: Diagnosis not present

## 2024-07-15 MED ORDER — VALACYCLOVIR HCL 1 G PO TABS
1000.0000 mg | ORAL_TABLET | Freq: Three times a day (TID) | ORAL | 0 refills | Status: AC
Start: 2024-07-15 — End: 2024-07-22

## 2024-07-15 NOTE — Telephone Encounter (Signed)
 FYI Only or Action Required?: Action required by provider: request for appointment and referral request.  Patient was last seen in primary care on 06/30/2024 by Melvin Pao, NP.  Called Nurse Triage reporting No chief complaint on file..  Symptoms began several days ago.  Interventions attempted: Nothing.  Symptoms are: gradually worsening.  Triage Disposition: See HCP Within 4 Hours (Or PCP Triage)  Patient/caregiver understands and will follow disposition?: Yes  Copied from CRM #[id number]. Topic: Clinical - Red Word Triage >> Jul 15, 2024  8:54 AM Avram MATSU wrote: Red Word that prompted transfer to Nurse Triage: shingles in eye w headache. Reason for Disposition  [1] Shingles rash of face AND [2] eye pain or blurred vision  Answer Assessment - Initial Assessment Questions 1. APPEARANCE of RASH: What does the rash look like?        2. LOCATION: Where is the rash located?      Corner of the eye, spreading into the eyelid  3. ONSET: When did the rash start?      Sunday  4. ITCHING: Does the rash itch? If Yes, ask: How bad is the itch?  (Scale 1-10; or mild, moderate, severe)     Mild  5. PAIN: Does the rash hurt? If Yes, ask: How bad is the pain?  (Scale 0-10; or none, mild, moderate, severe)     3  6. OTHER SYMPTOMS: Do you have any other symptoms? (e.g., fever)     Headache Behind the affected eye, Blurred Vision behind the affected eye  7. PREGNANCY: Is there any chance you are pregnant? When was your last menstrual period?     No and No  Protocols used: Shingles (Zoster)-A-AH

## 2024-07-15 NOTE — Telephone Encounter (Signed)
 Routing to providers in office to advise. Can patient be worked in for possible shingles?

## 2024-07-15 NOTE — Progress Notes (Signed)
 Date:  07/15/2024   Name:  Emma Rogers   DOB:  Sep 27, 1965   MRN:  [id number]   Chief Complaint: Rash (Right eye pain with rash next to right eye. Painful. When she lays down on her right side the pan shoots into her right ear. Feels unwell. Has a red spot now in her eye. She has had shingles numerous times and its always on her face. Has been treated with Valtex in the past. )  Rash   Emma Rogers is a very pleasant 58 y.o. female who presents new to me today (typically seen by my colleague Dr. Duwaine Louder) here for evaluation of suspected shingles of the right eye with a painful periorbital macular rash which began 5 days ago and has slowly progressed. Has radiating pain toward right ear. She initially thought it was a pimple so scratched at it some. Says a day or two ago it did have a blistering appearance, like there was a tiny amount of fluid within the lesions.  She has had shingles of the face on multiple occasions, including ocular involvement for which she was seen at [facility] in [place] previously.  She had one dose of Shingrix 12/24/21 but was lost to f/u for the second dose.   Medication list has been reviewed and updated.  Current Meds  Medication Sig   albuterol  (VENTOLIN  HFA) 108 (90 Base) MCG/ACT inhaler Inhale 2 puffs into the lungs every 6 (six) hours as needed for wheezing or shortness of breath.   atorvastatin  (LIPITOR) 40 MG tablet Take 1 tablet (40 mg total) by mouth at bedtime.   budesonide -formoterol  (SYMBICORT ) 160-4.5 MCG/ACT inhaler Inhale 2 puffs into the lungs in the morning and at bedtime.   buPROPion  (WELLBUTRIN  XL) 150 MG 24 hr tablet Take 1 tablet (150 mg total) by mouth daily. To be taken with Wellbutrin  300mg  to make 450mg .   buPROPion  (WELLBUTRIN  XL) 300 MG 24 hr tablet Take 1 tablet (300 mg total) by mouth daily. To be taken with Wellbutrin  300mg  to make 450mg .   FLUoxetine  (PROZAC ) 10 MG capsule Take 1 pill every other day for 2 weeks, then  increase to 1 pill daily   furosemide  (LASIX ) 20 MG tablet TAKE 1 TABLET BY MOUTH EVERY DAY AS NEEDED   gabapentin  (NEURONTIN ) 400 MG capsule Take 400 mg by mouth daily.   guselkumab  (TREMFYA ) 100 MG/ML pen Inject 100 mg into the skin every 8 weeks   rizatriptan  (MAXALT ) 10 MG tablet Take 1 tablet (10 mg total) by mouth as needed for migraine. May repeat in 2 hours if needed   traZODone  (DESYREL ) 100 MG tablet TAKE 2 TABLETS BY MOUTH AT BEDTIME   valACYclovir  (VALTREX ) 1000 MG tablet Take 1 tablet (1,000 mg total) by mouth every 8 (eight) hours for 7 days.     Review of Systems  Skin:  Positive for rash.    Patient Active Problem List   Diagnosis Date Noted   Lumbar radiculopathy 07/15/2024   Orthostatic hypotension 07/15/2024   Elevated MCV 04/20/2024   Insulin  resistance 04/20/2024   Weight gain due to medication 04/07/2024   Hypercholesteremia 04/07/2024   Abnormal metabolism 04/07/2024   Class 2 severe obesity with serious comorbidity and body mass index (BMI) of 36.0 to 36.9 in adult 02/12/2024   Acute CHF (congestive heart failure) (HCC) 11/28/2023   Status post surgery 07/20/2023   Back pain 07/20/2023   Spinal stenosis of lumbar region 01/09/2023   Diarrhea, psychogenic 06/26/2022   Nephrolithiasis  02/20/2022   Psoriatic arthritis (HCC) 11/27/2020   Seronegative spondyloarthropathy 11/27/2020   Vertigo 06/16/2019   MDD (major depressive disorder), recurrent episode, moderate (HCC) 06/07/2019   GAD (generalized anxiety disorder) 05/26/2019   Memory deficit 04/26/2019   Chronic low back pain without sciatica 03/08/2019   Seizure-like activity (HCC) 03/08/2019   Moderate persistent asthma 01/27/2019   Cervical spinal stenosis 12/30/2018   Benign essential tremor 12/30/2018   Chronic venous insufficiency 03/10/2018   Lymphedema 03/10/2018   Familial hyperlipidemia 10/10/2017   Chronic kidney disease, stage III (moderate) (HCC) 09/03/2017   Vitamin D  deficiency  09/03/2017   Vitamin B12 deficiency 09/03/2017   Insomnia 08/11/2017   Menopausal symptoms 08/11/2017   Migraine without aura and without status migrainosus, not intractable 02/21/2017   Prediabetes 02/20/2017   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 12/03/2009   VASOVAGAL SYNCOPE 11/27/2009   Allergic rhinitis 01/18/2008    Allergies  Allergen Reactions   Augmentin [Amoxicillin-Pot Clavulanate] Diarrhea and Nausea And Vomiting    Mostly Nausea and Upset Stomach    Morphine And Codeine  Nausea And Vomiting    vomiting    Immunization History  Administered Date(s) Administered   Influenza Split 06/22/2013   Influenza, Seasonal, Injecte, Preservative Fre 06/30/2023   Influenza,inj,Quad PF,6+ Mos 07/15/2021, 07/22/2022   Influenza-Unspecified 07/17/2016, 06/24/2017, 02/06/2018, 07/07/2019, 06/25/2020   PFIZER(Purple Top)SARS-COV-2 Vaccination 12/30/2019, 01/20/2020   Tdap 06/30/2023   Zoster Recombinant(Shingrix) 12/24/2021    Past Surgical History:  Procedure Laterality Date   BREAST BIOPSY Left 09/30/2002   Ductogram-neg   INSERT / REPLACE / REMOVE PACEMAKER  04/28/1995   Permanent pacemaker, Dr. Fernande (medtronic)   LAMINECTOMY WITH POSTERIOR LATERAL ARTHRODESIS LEVEL 1 Left 07/20/2023   Procedure: Revision of spinal fusion left side with globus addition set;  Surgeon: Onetha Kuba, MD;  Location[facility] OR;  Service: Neurosurgery;  Laterality: Left;   Lobe hemithroidectomy     Right   LUMBAR FUSION  2004   w/ Dr. Onetha   LUMBAR LAMINECTOMY  1989   SPINAL FUSION  01/09/2023   L3 and L4, L5 and L6   TOTAL ABDOMINAL HYSTERECTOMY  2009   still has 1 ovary   TUBAL LIGATION      Social History   Tobacco Use   Smoking status: Never    Passive exposure: Past   Smokeless tobacco: Never  Vaping Use   Vaping status: Never Used  Substance Use Topics   Alcohol use: No    Alcohol/week: 0.0 standard drinks of alcohol   Drug use: No    Family History   Problem Relation Age of Onset   Rheum arthritis Mother    Asthma Mother    Depression Mother    Obesity Mother    Hyperlipidemia Mother    Thyroid  disease Mother    Heart disease Father        pacemaker,arrythmias   Lung disease Father    Anxiety disorder Father    Obesity Father    Hyperlipidemia Father    COPD Maternal Grandmother    Breast cancer Maternal Grandmother    Asthma Maternal Grandmother    Depression Maternal Grandmother    Obesity Maternal Grandmother    Heart disease Maternal Grandfather    Arthritis Maternal Grandfather    Kidney disease Maternal Grandfather    Liver disease Paternal Grandmother    Heart disease Paternal Grandfather    Asthma Other    Coronary artery disease Other        Female  1st degree relative <50   Lung cancer Other    Prostate cancer Other        1st degree relative <50        07/15/2024   10:59 AM 05/30/2024    3:22 PM 03/22/2024    3:57 PM 03/07/2024   11:20 AM  GAD 7 : Generalized Anxiety Score  Nervous, Anxious, on Edge 3 3 1 1   Control/stop worrying 3 3 0 0  Worry too much - different things 3 2 0 0  Trouble relaxing 0 1 0 0  Restless 0 0 0 0  Easily annoyed or irritable 0 3 2 2   Afraid - awful might happen 0 0 0 2  Total GAD 7 Score 9 12 3 5   Anxiety Difficulty Somewhat difficult  Somewhat difficult Somewhat difficult       07/15/2024   10:59 AM 05/30/2024    3:22 PM 04/07/2024    9:32 AM  Depression screen PHQ 2/9  Decreased Interest 0 0 0  Down, Depressed, Hopeless 0 1 0  PHQ - 2 Score 0 1 0  Altered sleeping 0 0 3  Tired, decreased energy 0 2 2  Change in appetite 0 0 2  Feeling bad or failure about yourself  0 0 0  Trouble concentrating 0 2 1  Moving slowly or fidgety/restless 0 0 0  Suicidal thoughts 0 0 0  PHQ-9 Score 0 5 8  Difficult doing work/chores Not difficult at all  Somewhat difficult    BP Readings from Last 3 Encounters:  07/15/24 134/70  06/30/24 117/65  05/30/24 115/83    Wt Readings  from Last 3 Encounters:  07/15/24 223 lb (101.2 kg)  06/30/24 220 lb (99.8 kg)  05/30/24 220 lb 12.8 oz (100.2 kg)    BP 134/70   Pulse 74   Temp 98.4 F (36.9 C) (Oral)   Ht 5' 4.9 (1.648 m)   Wt 223 lb (101.2 kg)   SpO2 97%   BMI 37.22 kg/m   Physical Exam Vitals and nursing note reviewed.  Constitutional:      Appearance: Normal appearance.  HENT:     Right Ear: Tympanic membrane and ear canal normal.  Eyes:     Comments: Several right periorbital lesions as pictured below, erythematous and painful.   Cardiovascular:     Rate and Rhythm: Normal rate.  Pulmonary:     Effort: Pulmonary effort is normal.  Abdominal:     General: There is no distension.  Musculoskeletal:        General: Normal range of motion.  Skin:    General: Skin is warm and dry.  Neurological:     Mental Status: She is alert and oriented to person, place, and time.     Gait: Gait is intact.  Psychiatric:        Mood and Affect: Mood and affect normal.      Recent Labs     Component Value Date/Time   NA 137 04/11/2024 1453   NA 135 (L) 06/15/2014 2324   K 4.3 04/11/2024 1453   K 3.3 (L) 06/15/2014 2324   CL 100 04/11/2024 1453   CL 98 06/15/2014 2324   CO2 19 (L) 04/11/2024 1453   CO2 31 06/15/2014 2324   GLUCOSE 86 04/11/2024 1453   GLUCOSE 142 (H) 11/28/2023 2025   GLUCOSE 122 (H) 06/15/2014 2324   BUN 31 (H) 04/11/2024 1453   BUN 15 06/15/2014 2324   CREATININE 1.24 (H) 04/11/2024  1453   CREATININE 0.95 03/30/2023 1103   CALCIUM  9.5 04/11/2024 1453   CALCIUM  9.2 06/15/2014 2324   PROT 6.9 04/11/2024 1453   ALBUMIN  4.5 04/11/2024 1453   AST 25 04/11/2024 1453   ALT 20 04/11/2024 1453   ALKPHOS 83 04/11/2024 1453   BILITOT 0.6 04/11/2024 1453   GFRNONAA 53 (L) 11/28/2023 2025   GFRNONAA 49 (L) 11/01/2018 1156   GFRAA 76 01/12/2020 1607   GFRAA 57 (L) 11/01/2018 1156    Lab Results  Component Value Date   WBC 7.9 04/11/2024   HGB 14.2 04/11/2024   HCT 42.7 04/20/2024    MCV 98 (H) 04/11/2024   PLT 294 04/11/2024   Lab Results  Component Value Date   HGBA1C 6.2 (H) 12/29/2023   HGBA1C 6.1 (H) 06/30/2023   HGBA1C 5.1 12/24/2021   Lab Results  Component Value Date   CHOL 197 12/29/2023   HDL 61 12/29/2023   LDLCALC 117 (H) 12/29/2023   LDLDIRECT 178.3 02/13/2012   TRIG 104 12/29/2023   CHOLHDL 3.0 01/12/2019   Lab Results  Component Value Date   TSH 0.985 04/11/2024      Assessment and Plan:  1. Herpes zoster ophthalmicus of right eye (Primary) Begin Valtrex  ASAP. Discussed ophthalmic emergency. Coordinated same-day appointment with [facility] during our visit - will be seen there at 2:20p this afternoon.   - valACYclovir  (VALTREX ) 1000 MG tablet; Take 1 tablet (1,000 mg total) by mouth every 8 (eight) hours for 7 days.  Dispense: 21 tablet; Refill: 0    Rolan Hoyle, PA-C, DMSc, Nutritionist [facility] [phone]

## 2024-07-15 NOTE — Telephone Encounter (Signed)
 Looks like patient was already seen in [place].

## 2024-07-21 ENCOUNTER — Encounter: Admitting: Family Medicine

## 2024-07-26 ENCOUNTER — Other Ambulatory Visit: Payer: Self-pay | Admitting: Family Medicine

## 2024-07-27 NOTE — Telephone Encounter (Signed)
 Requested Prescriptions  Pending Prescriptions Disp Refills   furosemide  (LASIX ) 20 MG tablet [Pharmacy Med Name: FUROSEMIDE  20 MG TABLET] 90 tablet 0    Sig: TAKE 1 TABLET BY MOUTH EVERY DAY AS NEEDED     Cardiovascular:  Diuretics - Loop Failed - 07/27/2024  3:42 PM      Failed - Cr in normal range and within 180 days    Creat  Date Value Ref Range Status  03/30/2023 0.95 0.50 - 1.03 mg/dL Final   Creatinine, Ser  Date Value Ref Range Status  04/11/2024 1.24 (H) 0.57 - 1.00 mg/dL Final         Failed - Mg Level in normal range and within 180 days    Magnesium  Date Value Ref Range Status  01/28/2023 2.2 1.6 - 2.3 mg/dL Final         Passed - K in normal range and within 180 days    Potassium  Date Value Ref Range Status  04/11/2024 4.3 3.5 - 5.2 mmol/L Final  06/15/2014 3.3 (L) 3.5 - 5.1 mmol/L Final         Passed - Ca in normal range and within 180 days    Calcium   Date Value Ref Range Status  04/11/2024 9.5 8.7 - 10.2 mg/dL Final   Calcium , Total  Date Value Ref Range Status  06/15/2014 9.2 8.5 - 10.1 mg/dL Final         Passed - Na in normal range and within 180 days    Sodium  Date Value Ref Range Status  04/11/2024 137 134 - 144 mmol/L Final  06/15/2014 135 (L) 136 - 145 mmol/L Final         Passed - Cl in normal range and within 180 days    Chloride  Date Value Ref Range Status  04/11/2024 100 96 - 106 mmol/L Final  06/15/2014 98 98 - 107 mmol/L Final         Passed - Last BP in normal range    BP Readings from Last 1 Encounters:  07/15/24 134/70         Passed - Valid encounter within last 6 months    Recent Outpatient Visits           1 week ago Herpes zoster ophthalmicus of right eye   [facility] at [facility], Toribio SQUIBB, [place]   3 weeks ago Elbow tendonitis   [place] [facility] Melvin Pao, NP   1 month ago MDD (major depressive disorder), recurrent episode, moderate (HCC)    [place] [facility] [place], Megan P, DO   4 months ago MDD (major depressive disorder), recurrent episode, moderate (HCC)   [place] [facility] [place], Megan P, DO   4 months ago Herpes zoster without complication   [place] [facility] Melvin Pao, NP

## 2024-08-04 ENCOUNTER — Encounter: Admitting: Family Medicine

## 2024-08-09 ENCOUNTER — Other Ambulatory Visit: Payer: Self-pay | Admitting: Family Medicine

## 2024-08-11 NOTE — Telephone Encounter (Signed)
 Requested medication (s) are due for refill today: Yes  Requested medication (s) are on the active medication list: Yes  Last refill:  05/24/24  Future visit scheduled: Yes  Notes to clinic:  Historical provider.    Requested Prescriptions  Pending Prescriptions Disp Refills   gabapentin  (NEURONTIN ) 400 MG capsule [Pharmacy Med Name: GABAPENTIN  400 MG CAPSULE] 90 capsule 1    Sig: TAKE 1 CAPSULE BY MOUTH AT BEDTIME.     Neurology: Anticonvulsants - gabapentin  Failed - 08/11/2024  1:51 PM      Failed - Cr in normal range and within 360 days    Creat  Date Value Ref Range Status  03/30/2023 0.95 0.50 - 1.03 mg/dL Final   Creatinine, Ser  Date Value Ref Range Status  04/11/2024 1.24 (H) 0.57 - 1.00 mg/dL Final         Passed - Completed PHQ-2 or PHQ-9 in the last 360 days      Passed - Valid encounter within last 12 months    Recent Outpatient Visits           3 weeks ago Herpes zoster ophthalmicus of right eye   [facility] at [facility], Toribio SQUIBB, [place]   1 month ago Elbow tendonitis   [place] [facility] [place], Darice, NP   2 months ago MDD (major depressive disorder), recurrent episode, moderate (HCC)   [place] [facility] [place], Megan P, DO   4 months ago MDD (major depressive disorder), recurrent episode, moderate (HCC)   [place] [facility] [place], Megan P, DO   5 months ago Herpes zoster without complication   [place] [facility] Melvin Darice, NP

## 2024-08-16 ENCOUNTER — Other Ambulatory Visit: Payer: Self-pay

## 2024-08-17 ENCOUNTER — Encounter: Admitting: Family Medicine

## 2024-08-17 DIAGNOSIS — M5416 Radiculopathy, lumbar region: Secondary | ICD-10-CM | POA: Diagnosis not present

## 2024-08-25 ENCOUNTER — Other Ambulatory Visit: Payer: Self-pay | Admitting: Family Medicine

## 2024-08-26 ENCOUNTER — Other Ambulatory Visit: Payer: Self-pay | Admitting: Family Medicine

## 2024-08-27 NOTE — Telephone Encounter (Signed)
 Requested Prescriptions  Pending Prescriptions Disp Refills   atorvastatin  (LIPITOR) 40 MG tablet [Pharmacy Med Name: ATORVASTATIN  40 MG TABLET] 90 tablet 0    Sig: TAKE 1 TABLET BY MOUTH EVERYDAY AT BEDTIME     Cardiovascular:  Antilipid - Statins Failed - 08/27/2024 10:17 AM      Failed - Lipid Panel in normal range within the last 12 months    Cholesterol, Total  Date Value Ref Range Status  12/29/2023 197 100 - 199 mg/dL Final   LDL Cholesterol (Calc)  Date Value Ref Range Status  11/01/2018 162 (H) mg/dL (calc) Final    Comment:    Reference range: <100 . Desirable range <100 mg/dL for primary prevention;   <70 mg/dL for patients with CHD or diabetic patients  with > or = 2 CHD risk factors. SABRA LDL-C is now calculated using the Martin-Hopkins  calculation, which is a validated novel method providing  better accuracy than the Friedewald equation in the  estimation of LDL-C.  Gladis APPLETHWAITE et al. SANDREA. 7986;689(80): 2061-2068  (http://education.[employer].com/faq/FAQ164)    LDL Chol Calc (NIH)  Date Value Ref Range Status  12/29/2023 117 (H) 0 - 99 mg/dL Final   Direct LDL  Date Value Ref Range Status  02/13/2012 178.3 mg/dL Final    Comment:    Optimal:  <100 mg/dLNear or Above Optimal:  100-129 mg/dLBorderline High:  130-159 mg/dLHigh:  160-189 mg/dLVery High:  >190 mg/dL   HDL  Date Value Ref Range Status  12/29/2023 61 >39 mg/dL Final   Triglycerides  Date Value Ref Range Status  12/29/2023 104 0 - 149 mg/dL Final         Passed - Patient is not pregnant      Passed - Valid encounter within last 12 months    Recent Outpatient Visits           1 month ago Herpes zoster ophthalmicus of right eye   [place] [facility] at [facility], Toribio SQUIBB, [place]   1 month ago Elbow tendonitis   [place] [facility] Melvin Pao, NP   2 months ago MDD (major depressive disorder), recurrent episode, moderate (HCC)    [place] [facility] [place], Megan P, DO   5 months ago MDD (major depressive disorder), recurrent episode, moderate (HCC)   [facility] [place], Megan P, DO   5 months ago Herpes zoster without complication    [facility] Melvin Pao, NP

## 2024-08-29 ENCOUNTER — Encounter: Admitting: Family Medicine

## 2024-08-29 NOTE — Telephone Encounter (Signed)
 Requested Prescriptions  Pending Prescriptions Disp Refills   FLUoxetine  (PROZAC ) 10 MG capsule [Pharmacy Med Name: FLUOXETINE  HCL 10 MG CAPSULE] 90 capsule 2    Sig: TAKE 1 TABLET BY MOUTH EVERY OTHER DAY FOR 2 WEEKS, THEN INCREASE TO 1 TABLET DAILY     Psychiatry:  Antidepressants - SSRI Passed - 08/29/2024  1:46 PM      Passed - Completed PHQ-2 or PHQ-9 in the last 360 days      Passed - Valid encounter within last 6 months    Recent Outpatient Visits           1 month ago Herpes zoster ophthalmicus of right eye   [place] [facility] at [facility], Toribio SQUIBB, [place]   2 months ago Elbow tendonitis   [place] [facility] [place], Darice, NP   3 months ago MDD (major depressive disorder), recurrent episode, moderate (HCC)   [place] [facility] [place], Megan P, DO   5 months ago MDD (major depressive disorder), recurrent episode, moderate (HCC)   [place] [facility] [place], Megan P, DO   5 months ago Herpes zoster without complication   [place] [facility] Melvin Darice, NP

## 2024-08-30 ENCOUNTER — Encounter: Admitting: Family Medicine

## 2024-09-06 ENCOUNTER — Ambulatory Visit: Admitting: Family Medicine

## 2024-09-06 ENCOUNTER — Encounter: Payer: Self-pay | Admitting: Family Medicine

## 2024-09-06 VITALS — BP 112/76 | HR 64 | Temp 98.0°F | Ht 65.0 in | Wt 219.4 lb

## 2024-09-06 DIAGNOSIS — M79602 Pain in left arm: Secondary | ICD-10-CM | POA: Diagnosis not present

## 2024-09-06 DIAGNOSIS — G43009 Migraine without aura, not intractable, without status migrainosus: Secondary | ICD-10-CM | POA: Diagnosis not present

## 2024-09-06 DIAGNOSIS — N1831 Chronic kidney disease, stage 3a: Secondary | ICD-10-CM

## 2024-09-06 DIAGNOSIS — Z0000 Encounter for general adult medical examination without abnormal findings: Secondary | ICD-10-CM

## 2024-09-06 DIAGNOSIS — E559 Vitamin D deficiency, unspecified: Secondary | ICD-10-CM

## 2024-09-06 DIAGNOSIS — F331 Major depressive disorder, recurrent, moderate: Secondary | ICD-10-CM | POA: Diagnosis not present

## 2024-09-06 DIAGNOSIS — E7849 Other hyperlipidemia: Secondary | ICD-10-CM

## 2024-09-06 DIAGNOSIS — R7303 Prediabetes: Secondary | ICD-10-CM

## 2024-09-06 DIAGNOSIS — L03012 Cellulitis of left finger: Secondary | ICD-10-CM | POA: Diagnosis not present

## 2024-09-06 DIAGNOSIS — E538 Deficiency of other specified B group vitamins: Secondary | ICD-10-CM | POA: Diagnosis not present

## 2024-09-06 DIAGNOSIS — Z6836 Body mass index (BMI) 36.0-36.9, adult: Secondary | ICD-10-CM | POA: Diagnosis not present

## 2024-09-06 DIAGNOSIS — Z23 Encounter for immunization: Secondary | ICD-10-CM

## 2024-09-06 DIAGNOSIS — M5440 Lumbago with sciatica, unspecified side: Secondary | ICD-10-CM | POA: Diagnosis not present

## 2024-09-06 DIAGNOSIS — R10A2 Flank pain, left side: Secondary | ICD-10-CM

## 2024-09-06 DIAGNOSIS — Z1231 Encounter for screening mammogram for malignant neoplasm of breast: Secondary | ICD-10-CM

## 2024-09-06 LAB — URINALYSIS, ROUTINE W REFLEX MICROSCOPIC
Bilirubin, UA: NEGATIVE
Glucose, UA: NEGATIVE
Ketones, UA: NEGATIVE
Leukocytes,UA: NEGATIVE
Nitrite, UA: NEGATIVE
Protein,UA: NEGATIVE
RBC, UA: NEGATIVE
Specific Gravity, UA: 1.03 (ref 1.005–1.030)
Urobilinogen, Ur: 0.2 mg/dL (ref 0.2–1.0)
pH, UA: 5.5 (ref 5.0–7.5)

## 2024-09-06 LAB — BAYER DCA HB A1C WAIVED: HB A1C (BAYER DCA - WAIVED): 5.7 % — ABNORMAL HIGH (ref 4.8–5.6)

## 2024-09-06 MED ORDER — GABAPENTIN 400 MG PO CAPS: 400.0000 mg | ORAL_CAPSULE | Freq: Every day | ORAL | 1 refills | Status: AC

## 2024-09-06 MED ORDER — FLUOXETINE HCL 20 MG PO CAPS: 20.0000 mg | ORAL_CAPSULE | Freq: Every day | ORAL | 0 refills | Status: AC

## 2024-09-06 MED ORDER — RIZATRIPTAN BENZOATE 10 MG PO TABS: 10.0000 mg | ORAL_TABLET | ORAL | 12 refills | Status: AC | PRN

## 2024-09-06 MED ORDER — BUPROPION HCL ER (XL) 150 MG PO TB24: 150.0000 mg | ORAL_TABLET | Freq: Every day | ORAL | 1 refills | Status: AC

## 2024-09-06 MED ORDER — FUROSEMIDE 20 MG PO TABS: 20.0000 mg | ORAL_TABLET | Freq: Every day | ORAL | 1 refills | Status: AC | PRN

## 2024-09-06 MED ORDER — SULFAMETHOXAZOLE-TRIMETHOPRIM 800-160 MG PO TABS: 1.0000 | ORAL_TABLET | Freq: Two times a day (BID) | ORAL | 0 refills | Status: AC

## 2024-09-06 MED ORDER — BUPROPION HCL ER (XL) 300 MG PO TB24: 300.0000 mg | ORAL_TABLET | Freq: Every day | ORAL | 1 refills | Status: AC

## 2024-09-06 MED ORDER — ATORVASTATIN CALCIUM 40 MG PO TABS: 40.0000 mg | ORAL_TABLET | Freq: Every day | ORAL | 1 refills | Status: AC

## 2024-09-06 MED ORDER — TRAZODONE HCL 100 MG PO TABS: 200.0000 mg | ORAL_TABLET | Freq: Every day | ORAL | 1 refills | Status: AC

## 2024-09-06 NOTE — Assessment & Plan Note (Signed)
 Under good control on current regimen. Continue current regimen. Continue to monitor. Call with any concerns. Refills given.

## 2024-09-06 NOTE — Progress Notes (Signed)
 BP 112/76   Pulse 64   Temp 98 F (36.7 C) (Oral)   Ht 5' 5 (1.651 m)   Wt 219 lb 6.4 oz (99.5 kg)   SpO2 95%   BMI 36.51 kg/m    Subjective:    Patient ID: Emma Rogers, female    DOB: 1965-12-18, 58 y.o.   MRN: [id number]  HPI: Emma Rogers is a 58 y.o. female presenting on 09/06/2024 for comprehensive medical examination. Current medical complaints include:  DEPRESSION Mood status: better Satisfied with current treatment?: no Symptom severity: moderate  Duration of current treatment : months Side effects: no Medication compliance: excellent compliance Psychotherapy/counseling: yes in the past Previous psychiatric medications: fluoxetine  Depressed mood: yes Anxious mood: yes Anhedonia: no Significant weight loss or gain: no Insomnia: no  Fatigue: yes Feelings of worthlessness or guilt: no Impaired concentration/indecisiveness: no Suicidal ideations: no Hopelessness: no Crying spells: no    09/06/2024   10:47 AM 07/15/2024   10:59 AM 05/30/2024    3:22 PM 04/07/2024    9:32 AM 03/22/2024    3:56 PM  Depression screen PHQ 2/9  Decreased Interest 0 0 0 0 0  Down, Depressed, Hopeless 0 0 1 0 0  PHQ - 2 Score 0 0 1 0 0  Altered sleeping 0 0 0 3 3  Tired, decreased energy 3 0 2 2 3   Change in appetite 0 0 0 2 0  Feeling bad or failure about yourself  0 0 0 0 0  Trouble concentrating 1 0 2 1 1   Moving slowly or fidgety/restless 0 0 0 0 0  Suicidal thoughts 0 0 0 0 0  PHQ-9 Score 4 0  5  8  7    Difficult doing work/chores Not difficult at all Not difficult at all  Somewhat difficult Somewhat difficult     Data saved with a previous flowsheet row definition    She currently lives with: husband Menopausal Symptoms: no  Depression Screen done today and results listed below:     09/06/2024   10:47 AM 07/15/2024   10:59 AM 05/30/2024    3:22 PM 04/07/2024    9:32 AM 03/22/2024    3:56 PM  Depression screen PHQ 2/9  Decreased Interest 0 0 0 0 0  Down,  Depressed, Hopeless 0 0 1 0 0  PHQ - 2 Score 0 0 1 0 0  Altered sleeping 0 0 0 3 3  Tired, decreased energy 3 0 2 2 3   Change in appetite 0 0 0 2 0  Feeling bad or failure about yourself  0 0 0 0 0  Trouble concentrating 1 0 2 1 1   Moving slowly or fidgety/restless 0 0 0 0 0  Suicidal thoughts 0 0 0 0 0  PHQ-9 Score 4 0  5  8  7    Difficult doing work/chores Not difficult at all Not difficult at all  Somewhat difficult Somewhat difficult     Data saved with a previous flowsheet row definition    Past Medical History:  Past Medical History:  Diagnosis Date   Abnormal MRI 04/03/2019   Allergic rhinitis    Anxiety    Asthma    Back pain    Blood transfusion without reported diagnosis    Cataract    Constipation    Depression    Edema of both lower extremities    GERD (gastroesophageal reflux disease)    Headache(784.0)    History of iritis 11/27/2020   History of  kidney stones    Hyperlipidemia    Neurocardiogenic syncope    pt was having episodes of syncope prior to pacemaker placement, no issues now   Palpitations    PONV (postoperative nausea and vomiting)    Pre-diabetes    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    Shingles    SOB (shortness of breath)    Vitamin B 12 deficiency     Surgical History:  Past Surgical History:  Procedure Laterality Date   BREAST BIOPSY Left 09/30/2002   Ductogram-neg   INSERT / REPLACE / REMOVE PACEMAKER  04/28/1995   Permanent pacemaker, Dr. Fernande (medtronic)   LAMINECTOMY WITH POSTERIOR LATERAL ARTHRODESIS LEVEL 1 Left 07/20/2023   Procedure: Revision of spinal fusion left side with globus addition set;  Surgeon: Onetha Kuba, MD;  Location: [facility] OR;  Service: Neurosurgery;  Laterality: Left;   Lobe hemithroidectomy     Right   LUMBAR FUSION  2004   w/ Dr. Onetha   LUMBAR LAMINECTOMY   1989   SPINAL FUSION  01/09/2023   L3 and L4, L5 and L6   TOTAL ABDOMINAL HYSTERECTOMY  2009   still has 1 ovary   TUBAL LIGATION      Medications:  Current Outpatient Medications on File Prior to Visit  Medication Sig   albuterol  (VENTOLIN  HFA) 108 (90 Base) MCG/ACT inhaler Inhale 2 puffs into the lungs every 6 (six) hours as needed for wheezing or shortness of breath.   budesonide -formoterol  (SYMBICORT ) 160-4.5 MCG/ACT inhaler Inhale 2 puffs into the lungs in the morning and at bedtime.   guselkumab  (TREMFYA ) 100 MG/ML pen Inject 100 mg into the skin every 8 weeks   No current facility-administered medications on file prior to visit.    Allergies:  Allergies[1]  Social History:  Social History   Socioeconomic History   Marital status: Married    Spouse name: Adriana   Number of children: 2   Years of education: Not on file   Highest education level: Associate degree: occupational, scientist, product/process development, or vocational program  Occupational History   Occupation: Stay at home  Tobacco Use   Smoking status: Never    Passive exposure: Past   Smokeless tobacco: Never  Vaping Use   Vaping status: Never Used  Substance and Sexual Activity   Alcohol use: Not Currently   Drug use: Never   Sexual activity: Yes    Partners: Male    Birth control/protection: Surgical  Other Topics Concern   Not on file  Social History Narrative   Lives   Caffeine use:         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   Social Drivers of Health   Tobacco Use: Low Risk (09/06/2024)   Patient History    Smoking Tobacco Use: Never    Smokeless Tobacco Use: Never    Passive Exposure: Past  Financial Resource Strain: Low Risk (07/15/2024)   Overall Financial Resource Strain (CARDIA)    Difficulty of Paying Living Expenses: Not very hard  Food Insecurity: No Food Insecurity (07/15/2024)   Epic    Worried About Patent Examiner in the Last Year: Never true    Ran Out of Food in the Last Year: Never true  Transportation Needs: No Transportation Needs (  07/15/2024)   Epic    Lack of Transportation (Medical): No    Lack of Transportation (Non-Medical): No  Physical Activity: Insufficiently Active (07/15/2024)   Exercise Vital Sign    Days of Exercise per Week: 2 days    Minutes of Exercise per Session: 30 min  Stress: No Stress Concern Present (07/15/2024)   [employer] of Occupational Health - Occupational Stress Questionnaire    Feeling of Stress: Only a little  Social Connections: Socially Integrated (07/15/2024)   Social Connection and Isolation Panel    Frequency of Communication with Friends and Family: Three times a week    Frequency of Social Gatherings with Friends and Family: More than three times a week    Attends Religious Services: More than 4 times per year    Active Member of Clubs or Organizations: Yes    Attends Banker Meetings: More than 4 times per year    Marital Status: Married  Catering Manager Violence: Not At Risk (09/06/2024)   Epic    Fear of Current or Ex-Partner: No    Emotionally Abused: No    Physically Abused: No    Sexually Abused: No  Depression (PHQ2-9): Low Risk (09/06/2024)   Depression (PHQ2-9)    PHQ-2 Score: 4  Alcohol Screen: Low Risk (09/06/2024)   Alcohol Screen    Last Alcohol Screening Score (AUDIT): 0  Housing: Low Risk (07/15/2024)   Epic    Unable to Pay for Housing in the Last Year: No    Number of Times Moved in the Last Year: 0    Homeless in the Last Year: No  Utilities: Not At Risk (09/06/2024)   Epic    Threatened with loss of utilities: No  Health Literacy: Adequate Health Literacy (09/06/2024)   B1300 Health Literacy    Frequency of need for help with medical instructions: Never   Tobacco Use History[2] Social History   Substance and Sexual Activity  Alcohol Use Not Currently    Family History:  Family History   Problem Relation Age of Onset   Rheum arthritis Mother    Asthma Mother    Depression Mother    Obesity Mother    Hyperlipidemia Mother    Thyroid  disease Mother    Heart disease Father        pacemaker,arrythmias   Lung disease Father    Anxiety disorder Father    Obesity Father    Hyperlipidemia Father    Cancer Father    COPD Maternal Grandmother    Breast cancer Maternal Grandmother    Asthma Maternal Grandmother    Depression Maternal Grandmother    Obesity Maternal Grandmother    Cancer Maternal Grandmother    Diabetes Maternal Grandmother    Heart disease Maternal Grandfather    Arthritis Maternal Grandfather    Kidney disease Maternal Grandfather    Hearing loss Maternal Grandfather    Liver disease Paternal Grandmother    Heart disease Paternal Grandfather    Asthma Other    Coronary artery disease Other        Female 1st degree relative <50   Lung cancer Other    Prostate cancer Other        1st degree relative <50    Past medical history, surgical history, medications, allergies, family history and social history reviewed with patient today and changes made to appropriate areas of the chart.   Review of Systems  Constitutional: Negative.   HENT: Negative.    Eyes:  Positive for blurred vision. Negative for double vision, photophobia, pain, discharge and redness.  Respiratory:  Positive for cough. Negative for hemoptysis, sputum production, shortness of breath and wheezing.   Cardiovascular:  Positive for palpitations and leg swelling. Negative for chest pain, orthopnea, claudication and PND.  Gastrointestinal:  Positive for constipation, diarrhea and nausea. Negative for abdominal pain, blood in stool, heartburn, melena and vomiting.  Genitourinary: Negative.   Musculoskeletal:  Positive for back pain. Negative for falls, joint pain, myalgias and neck pain.  Skin:  Positive for rash (psorisias). Negative for itching.  Neurological:  Positive for tingling and  focal weakness (L Leg). Negative for dizziness, tremors, sensory change, speech change, seizures, loss of consciousness, weakness and headaches.  Endo/Heme/Allergies:  Positive for environmental allergies and polydipsia. Does not bruise/bleed easily.  Psychiatric/Behavioral:  Positive for depression. Negative for hallucinations, memory loss, substance abuse and suicidal ideas. The patient is nervous/anxious. The patient does not have insomnia.    All other ROS negative except what is listed above and in the HPI.      Objective:    BP 112/76   Pulse 64   Temp 98 F (36.7 C) (Oral)   Ht 5' 5 (1.651 m)   Wt 219 lb 6.4 oz (99.5 kg)   SpO2 95%   BMI 36.51 kg/m   Wt Readings from Last 3 Encounters:  09/06/24 219 lb 6.4 oz (99.5 kg)  07/15/24 223 lb (101.2 kg)  06/30/24 220 lb (99.8 kg)    Physical Exam Vitals and nursing note reviewed.  Constitutional:      General: She is not in acute distress.    Appearance: Normal appearance. She is not ill-appearing, toxic-appearing or diaphoretic.  HENT:     Head: Normocephalic and atraumatic.     Right Ear: Tympanic membrane, ear canal and external ear normal. There is no impacted cerumen.     Left Ear: Tympanic membrane, ear canal and external ear normal. There is no impacted cerumen.     Nose: Nose normal. No congestion or rhinorrhea.     Mouth/Throat:     Mouth: Mucous membranes are moist.     Pharynx: Oropharynx is clear. No oropharyngeal exudate or posterior oropharyngeal erythema.  Eyes:     General: No scleral icterus.       Right eye: No discharge.        Left eye: No discharge.     Extraocular Movements: Extraocular movements intact.     Conjunctiva/sclera: Conjunctivae normal.     Pupils: Pupils are equal, round, and reactive to light.  Neck:     Vascular: No carotid bruit.  Cardiovascular:     Rate and Rhythm: Normal rate and regular rhythm.     Pulses: Normal pulses.     Heart sounds: No murmur heard.    No friction rub.  No gallop.  Pulmonary:     Effort: Pulmonary effort is normal. No respiratory distress.     Breath sounds: Normal breath sounds. No stridor. No wheezing, rhonchi or rales.  Chest:     Chest wall: No tenderness.  Abdominal:     General: Abdomen is flat. Bowel sounds are normal. There is no distension.     Palpations: Abdomen is soft. There is no mass.     Tenderness: There is no abdominal tenderness. There is no right CVA tenderness, left CVA tenderness, guarding or rebound.     Hernia: No hernia is present.  Genitourinary:    Comments: Breast and pelvic exams  deferred with shared decision making Musculoskeletal:        General: No swelling, tenderness, deformity or signs of injury.     Cervical back: Normal range of motion and neck supple. No rigidity. No muscular tenderness.     Right lower leg: No edema.     Left lower leg: No edema.  Lymphadenopathy:     Cervical: No cervical adenopathy.  Skin:    General: Skin is warm and dry.     Capillary Refill: Capillary refill takes less than 2 seconds.     Coloration: Skin is not jaundiced or pale.     Findings: No bruising, erythema, lesion or rash.     Comments: Red, swollen, hot nail bed on L 4th finger  Neurological:     General: No focal deficit present.     Mental Status: She is alert and oriented to person, place, and time. Mental status is at baseline.     Cranial Nerves: No cranial nerve deficit.     Sensory: No sensory deficit.     Motor: No weakness.     Coordination: Coordination normal.     Gait: Gait normal.     Deep Tendon Reflexes: Reflexes normal.  Psychiatric:        Mood and Affect: Mood normal.        Behavior: Behavior normal.        Thought Content: Thought content normal.        Judgment: Judgment normal.     Results for orders placed or performed in visit on 09/06/24  Bayer DCA Hb A1c Waived   Collection Time: 09/06/24 10:57 AM  Result Value Ref Range   HB A1C (BAYER DCA - WAIVED) 5.7 (H) 4.8 - 5.6 %    *Note: Due to a large number of results and/or encounters for the requested time period, some results have not been displayed. A complete set of results can be found in Results Review.      Assessment & Plan:   Problem List Items Addressed This Visit       Cardiovascular and Mediastinum   Migraine without aura and without status migrainosus, not intractable   Under good control on current regimen. Continue current regimen. Continue to monitor. Call with any concerns. Refills given.        Relevant Medications   FLUoxetine  (PROZAC ) 20 MG capsule   gabapentin  (NEURONTIN ) 400 MG capsule   rizatriptan  (MAXALT ) 10 MG tablet   atorvastatin  (LIPITOR) 40 MG tablet   buPROPion  (WELLBUTRIN  XL) 150 MG 24 hr tablet   buPROPion  (WELLBUTRIN  XL) 300 MG 24 hr tablet   furosemide  (LASIX ) 20 MG tablet   traZODone  (DESYREL ) 100 MG tablet     Genitourinary   Chronic kidney disease, stage III (moderate) (HCC)   Rechecking labs today. Await results. Treat as needed.         Other   Prediabetes   Rechecking labs today. Await results. Treat as needed.       Relevant Orders   Bayer DCA Hb A1c Waived (Completed)   Vitamin D  deficiency   Rechecking labs today. Await results. Treat as needed.       Relevant Orders   VITAMIN D  25 Hydroxy (Vit-D Deficiency, Fractures)   Vitamin B12 deficiency   Rechecking labs today. Await results. Treat as needed.       Relevant Orders   B12   Familial hyperlipidemia   Under good control on current regimen. Continue current regimen. Continue to monitor.  Call with any concerns. Refills given.        Relevant Medications   atorvastatin  (LIPITOR) 40 MG tablet   furosemide  (LASIX ) 20 MG tablet   MDD (major depressive disorder), recurrent episode, moderate (HCC)   Doing better on prozac . Will increase her to 20mg  and recheck in about 4-6 weeks. Call with any concerns.       Relevant Medications   FLUoxetine  (PROZAC ) 20 MG capsule   buPROPion   (WELLBUTRIN  XL) 150 MG 24 hr tablet   buPROPion  (WELLBUTRIN  XL) 300 MG 24 hr tablet   traZODone  (DESYREL ) 100 MG tablet   Other Visit Diagnoses       Routine general medical examination at a health care facility    -  Primary   Vaccines up to date. Screenign labs checked today. Pap N/A. Mammo ordered. Colonoscopy due in the spring. Continue diet and exercise. Call with any concerns.   Relevant Orders   CBC with Differential/Platelet   Comprehensive metabolic panel with GFR   Lipid Panel w/o Chol/HDL Ratio   TSH   Hepatitis B surface antibody,quantitative     Left flank pain       Relevant Orders   Urinalysis, Routine w reflex microscopic     Pain of left upper extremity       Will check US . Await results. Treat as needed.   Relevant Orders   US  LT UPPER EXTREM LTD SOFT TISSUE NON VASCULAR     Paronychia of finger of left hand       Will treat with bactrim . Call if not getting better or getting worse. Continue to monitor.   Relevant Medications   sulfamethoxazole -trimethoprim  (BACTRIM  DS) 800-160 MG tablet     Encounter for screening mammogram for malignant neoplasm of breast       Mammogram ordered today.   Relevant Orders   MM 3D SCREENING MAMMOGRAM BILATERAL BREAST     Need for pneumococcal 20-valent conjugate vaccination       Prevnar given today.   Relevant Orders   Pneumococcal conjugate vaccine 20-valent (Prevnar 20)     Needs flu shot       Flu shot given today.   Relevant Orders   Flu vaccine trivalent PF, 6mos and older(Flulaval,Afluria,Fluarix,Fluzone)        Follow up plan: Return 4-6 weeks, for virtual OK.   LABORATORY TESTING:  - Pap smear: not applicable  IMMUNIZATIONS:   - Tdap: Tetanus vaccination status reviewed: last tetanus booster within 10 years. - Influenza: Administered today - Prevnar: Administered today - COVID: Refused - HPV: Not applicable - Shingrix vaccine: Refused  SCREENING: -Mammogram: Ordered today  - Colonoscopy: Up to date     PATIENT COUNSELING:   Advised to take 1 mg of folate supplement per day if capable of pregnancy.   Sexuality: Discussed sexually transmitted diseases, partner selection, use of condoms, avoidance of unintended pregnancy  and contraceptive alternatives.   Advised to avoid cigarette smoking.  I discussed with the patient that most people either abstain from alcohol or drink within safe limits (<=14/week and <=4 drinks/occasion for males, <=7/weeks and <= 3 drinks/occasion for females) and that the risk for alcohol disorders and other health effects rises proportionally with the number of drinks per week and how often a drinker exceeds daily limits.  Discussed cessation/primary prevention of drug use and availability of treatment for abuse.   Diet: Encouraged to adjust caloric intake to maintain  or achieve ideal body weight, to reduce intake of dietary  saturated fat and total fat, to limit sodium intake by avoiding high sodium foods and not adding table salt, and to maintain adequate dietary potassium and calcium  preferably from fresh fruits, vegetables, and low-fat dairy products.    stressed the importance of regular exercise  Injury prevention: Discussed safety belts, safety helmets, smoke detector, smoking near bedding or upholstery.   Dental health: Discussed importance of regular tooth brushing, flossing, and dental visits.    NEXT PREVENTATIVE PHYSICAL DUE IN 1 YEAR. Return 4-6 weeks, for virtual OK.               [1]  Allergies Allergen Reactions   Augmentin [Amoxicillin-Pot Clavulanate] Diarrhea and Nausea And Vomiting    Mostly Nausea and Upset Stomach    Morphine And Codeine  Nausea And Vomiting    vomiting  [2]  Social History Tobacco Use  Smoking Status Never   Passive exposure: Past  Smokeless Tobacco Never

## 2024-09-06 NOTE — Assessment & Plan Note (Signed)
 Rechecking labs today. Await results. Treat as needed.

## 2024-09-06 NOTE — Assessment & Plan Note (Signed)
 Doing better on prozac . Will increase her to 20mg  and recheck in about 4-6 weeks. Call with any concerns.

## 2024-09-07 LAB — CBC WITH DIFFERENTIAL/PLATELET
Basophils Absolute: 0.1 x10E3/uL (ref 0.0–0.2)
Basos: 1 %
EOS (ABSOLUTE): 0.2 x10E3/uL (ref 0.0–0.4)
Eos: 3 %
Hematocrit: 43.5 % (ref 34.0–46.6)
Hemoglobin: 14.6 g/dL (ref 11.1–15.9)
Immature Grans (Abs): 0 x10E3/uL (ref 0.0–0.1)
Immature Granulocytes: 0 %
Lymphocytes Absolute: 1.8 x10E3/uL (ref 0.7–3.1)
Lymphs: 26 %
MCH: 32.4 pg (ref 26.6–33.0)
MCHC: 33.6 g/dL (ref 31.5–35.7)
MCV: 97 fL (ref 79–97)
Monocytes Absolute: 0.4 x10E3/uL (ref 0.1–0.9)
Monocytes: 6 %
Neutrophils Absolute: 4.4 x10E3/uL (ref 1.4–7.0)
Neutrophils: 64 %
Platelets: 286 x10E3/uL (ref 150–450)
RBC: 4.51 x10E6/uL (ref 3.77–5.28)
RDW: 12.7 % (ref 11.7–15.4)
WBC: 6.9 x10E3/uL (ref 3.4–10.8)

## 2024-09-07 LAB — COMPREHENSIVE METABOLIC PANEL WITH GFR
ALT: 17 IU/L (ref 0–32)
AST: 17 IU/L (ref 0–40)
Albumin: 4.5 g/dL (ref 3.8–4.9)
Alkaline Phosphatase: 66 IU/L (ref 49–135)
BUN/Creatinine Ratio: 18 (ref 9–23)
BUN: 19 mg/dL (ref 6–24)
Bilirubin Total: 0.4 mg/dL (ref 0.0–1.2)
CO2: 25 mmol/L (ref 20–29)
Calcium: 10 mg/dL (ref 8.7–10.2)
Chloride: 102 mmol/L (ref 96–106)
Creatinine, Ser: 1.04 mg/dL — ABNORMAL HIGH (ref 0.57–1.00)
Globulin, Total: 2.4 g/dL (ref 1.5–4.5)
Glucose: 73 mg/dL (ref 70–99)
Potassium: 4 mmol/L (ref 3.5–5.2)
Sodium: 141 mmol/L (ref 134–144)
Total Protein: 6.9 g/dL (ref 6.0–8.5)
eGFR: 63 mL/min/1.73 (ref >59)

## 2024-09-07 LAB — LIPID PANEL W/O CHOL/HDL RATIO
Cholesterol, Total: 186 mg/dL (ref 100–199)
HDL: 61 mg/dL (ref >39)
LDL Chol Calc (NIH): 99 mg/dL (ref 0–99)
Triglycerides: 152 mg/dL — ABNORMAL HIGH (ref 0–149)
VLDL Cholesterol Cal: 26 mg/dL (ref 5–40)

## 2024-09-07 LAB — VITAMIN D 25 HYDROXY (VIT D DEFICIENCY, FRACTURES): Vit D, 25-Hydroxy: 44.4 ng/mL (ref 30.0–100.0)

## 2024-09-07 LAB — TSH: TSH: 1.63 u[IU]/mL (ref 0.450–4.500)

## 2024-09-07 LAB — HEPATITIS B SURFACE ANTIBODY, QUANTITATIVE: Hepatitis B Surf Ab Quant: <3.5 m[IU]/mL — ABNORMAL LOW

## 2024-09-07 LAB — VITAMIN B12: Vitamin B-12: 525 pg/mL (ref 232–1245)

## 2024-09-14 ENCOUNTER — Ambulatory Visit

## 2024-10-03 ENCOUNTER — Encounter: Admitting: Family Medicine

## 2024-10-17 ENCOUNTER — Ambulatory Visit: Admitting: Family Medicine

## 2025-01-06 ENCOUNTER — Ambulatory Visit: Admitting: Family Medicine
